# Patient Record
Sex: Male | Born: 1945
Health system: Southern US, Community
[De-identification: ages and names within clinical notes are randomized; demographics above are authoritative.]

## PROBLEM LIST (undated history)

## (undated) DIAGNOSIS — Z87442 Personal history of urinary calculi: Secondary | ICD-10-CM

## (undated) DIAGNOSIS — I509 Heart failure, unspecified: Secondary | ICD-10-CM

## (undated) DIAGNOSIS — N401 Enlarged prostate with lower urinary tract symptoms: Secondary | ICD-10-CM

## (undated) DIAGNOSIS — I129 Hypertensive chronic kidney disease with stage 1 through stage 4 chronic kidney disease, or unspecified chronic kidney disease: Secondary | ICD-10-CM

## (undated) DIAGNOSIS — I499 Cardiac arrhythmia, unspecified: Secondary | ICD-10-CM

## (undated) DIAGNOSIS — E1122 Type 2 diabetes mellitus with diabetic chronic kidney disease: Secondary | ICD-10-CM

## (undated) DIAGNOSIS — N529 Male erectile dysfunction, unspecified: Secondary | ICD-10-CM

## (undated) DIAGNOSIS — E119 Type 2 diabetes mellitus without complications: Secondary | ICD-10-CM

## (undated) DIAGNOSIS — I209 Angina pectoris, unspecified: Secondary | ICD-10-CM

## (undated) DIAGNOSIS — T4145XA Adverse effect of unspecified anesthetic, initial encounter: Secondary | ICD-10-CM

## (undated) DIAGNOSIS — Z9889 Other specified postprocedural states: Secondary | ICD-10-CM

## (undated) DIAGNOSIS — N400 Enlarged prostate without lower urinary tract symptoms: Secondary | ICD-10-CM

## (undated) DIAGNOSIS — G971 Other reaction to spinal and lumbar puncture: Secondary | ICD-10-CM

## (undated) DIAGNOSIS — E785 Hyperlipidemia, unspecified: Secondary | ICD-10-CM

## (undated) DIAGNOSIS — N183 Chronic kidney disease, stage 3 unspecified: Secondary | ICD-10-CM

## (undated) DIAGNOSIS — I429 Cardiomyopathy, unspecified: Secondary | ICD-10-CM

## (undated) DIAGNOSIS — R112 Nausea with vomiting, unspecified: Secondary | ICD-10-CM

## (undated) DIAGNOSIS — N210 Calculus in bladder: Secondary | ICD-10-CM

## (undated) DIAGNOSIS — T8859XA Other complications of anesthesia, initial encounter: Secondary | ICD-10-CM

## (undated) DIAGNOSIS — G4730 Sleep apnea, unspecified: Secondary | ICD-10-CM

## (undated) DIAGNOSIS — N138 Other obstructive and reflux uropathy: Secondary | ICD-10-CM

## (undated) DIAGNOSIS — M1990 Unspecified osteoarthritis, unspecified site: Secondary | ICD-10-CM

## (undated) DIAGNOSIS — F419 Anxiety disorder, unspecified: Secondary | ICD-10-CM

## (undated) DIAGNOSIS — F329 Major depressive disorder, single episode, unspecified: Secondary | ICD-10-CM

## (undated) DIAGNOSIS — I10 Essential (primary) hypertension: Secondary | ICD-10-CM

## (undated) DIAGNOSIS — F32A Depression, unspecified: Secondary | ICD-10-CM

## (undated) HISTORY — DX: Male erectile dysfunction, unspecified: N52.9

## (undated) HISTORY — DX: Unspecified osteoarthritis, unspecified site: M19.90

## (undated) HISTORY — PX: COLONOSCOPY: SHX174

## (undated) HISTORY — DX: Essential (primary) hypertension: I10

## (undated) HISTORY — DX: Anxiety disorder, unspecified: F41.9

## (undated) HISTORY — DX: Benign prostatic hyperplasia without lower urinary tract symptoms: N40.0

## (undated) HISTORY — DX: Sleep apnea, unspecified: G47.30

## (undated) HISTORY — PX: TONSILLECTOMY: SUR1361

---

## 1980-09-12 HISTORY — PX: VASECTOMY: SHX75

## 2000-05-27 HISTORY — PX: KNEE ARTHROSCOPY: SHX127

## 2002-02-24 ENCOUNTER — Ambulatory Visit (HOSPITAL_BASED_OUTPATIENT_CLINIC_OR_DEPARTMENT_OTHER): Admission: RE | Admit: 2002-02-24 | Discharge: 2002-02-24 | Payer: Self-pay | Admitting: *Deleted

## 2002-10-14 HISTORY — PX: RETINAL DETACHMENT REPAIR W/ SCLERAL BUCKLE LE: SHX2338

## 2002-12-02 ENCOUNTER — Encounter: Admission: RE | Admit: 2002-12-02 | Discharge: 2002-12-02 | Payer: Self-pay | Admitting: Family Medicine

## 2002-12-02 ENCOUNTER — Encounter: Payer: Self-pay | Admitting: Family Medicine

## 2003-03-13 HISTORY — PX: CATARACT EXTRACTION W/ INTRAOCULAR LENS  IMPLANT, BILATERAL: SHX1307

## 2004-05-24 ENCOUNTER — Encounter: Admission: RE | Admit: 2004-05-24 | Discharge: 2004-05-24 | Payer: Self-pay | Admitting: Family Medicine

## 2004-06-30 ENCOUNTER — Ambulatory Visit (HOSPITAL_COMMUNITY): Admission: AD | Admit: 2004-06-30 | Discharge: 2004-07-01 | Payer: Self-pay | Admitting: Ophthalmology

## 2004-09-10 ENCOUNTER — Ambulatory Visit: Payer: Self-pay | Admitting: Family Medicine

## 2004-09-29 ENCOUNTER — Ambulatory Visit: Payer: Self-pay | Admitting: Gastroenterology

## 2004-10-12 ENCOUNTER — Ambulatory Visit: Payer: Self-pay | Admitting: Gastroenterology

## 2006-06-29 ENCOUNTER — Ambulatory Visit: Payer: Self-pay | Admitting: Family Medicine

## 2006-06-29 LAB — CONVERTED CEMR LAB
ALT: 23 units/L (ref 0–40)
AST: 18 units/L (ref 0–37)
Albumin: 3.9 g/dL (ref 3.5–5.2)
Alkaline Phosphatase: 60 units/L (ref 39–117)
BUN: 14 mg/dL (ref 6–23)
Basophils Absolute: 0 10*3/uL (ref 0.0–0.1)
Basophils Relative: 0.6 % (ref 0.0–1.0)
CO2: 26 meq/L (ref 19–32)
Calcium: 9 mg/dL (ref 8.4–10.5)
Chloride: 107 meq/L (ref 96–112)
Chol/HDL Ratio, serum: 5.3
Cholesterol: 218 mg/dL (ref 0–200)
Creatinine, Ser: 1 mg/dL (ref 0.4–1.5)
Eosinophil percent: 4 % (ref 0.0–5.0)
GFR calc non Af Amer: 81 mL/min
Glomerular Filtration Rate, Af Am: 98 mL/min/{1.73_m2}
Glucose, Bld: 139 mg/dL — ABNORMAL HIGH (ref 70–99)
HCT: 45.2 % (ref 39.0–52.0)
HDL: 41.5 mg/dL (ref >39.0)
Hemoglobin: 15.1 g/dL (ref 13.0–17.0)
Hgb A1c MFr Bld: 7.1 % — ABNORMAL HIGH (ref 4.6–6.0)
LDL DIRECT: 147.1 mg/dL
Lymphocytes Relative: 22.3 % (ref 12.0–46.0)
MCHC: 33.3 g/dL (ref 30.0–36.0)
MCV: 91.4 fL (ref 78.0–100.0)
Monocytes Absolute: 0.6 10*3/uL (ref 0.2–0.7)
Monocytes Relative: 8.3 % (ref 3.0–11.0)
Neutro Abs: 4.3 10*3/uL (ref 1.4–7.7)
Neutrophils Relative %: 64.8 % (ref 43.0–77.0)
PSA: 1.71 ng/mL (ref 0.10–4.00)
Platelets: 239 10*3/uL (ref 150–400)
Potassium: 4 meq/L (ref 3.5–5.1)
RBC: 4.95 M/uL (ref 4.22–5.81)
RDW: 12.9 % (ref 11.5–14.6)
Sodium: 141 meq/L (ref 135–145)
TSH: 1.56 microintl units/mL (ref 0.35–5.50)
Total Bilirubin: 0.7 mg/dL (ref 0.3–1.2)
Total Protein: 6.8 g/dL (ref 6.0–8.3)
Triglyceride fasting, serum: 118 mg/dL (ref 0–149)
VLDL: 24 mg/dL (ref 0–40)
WBC: 6.7 10*3/uL (ref 4.5–10.5)

## 2006-07-06 ENCOUNTER — Ambulatory Visit: Payer: Self-pay | Admitting: Family Medicine

## 2006-08-02 ENCOUNTER — Ambulatory Visit: Payer: Self-pay | Admitting: Family Medicine

## 2006-08-07 ENCOUNTER — Ambulatory Visit: Payer: Self-pay | Admitting: Internal Medicine

## 2006-08-28 ENCOUNTER — Ambulatory Visit: Payer: Self-pay | Admitting: Family Medicine

## 2007-01-30 ENCOUNTER — Ambulatory Visit: Payer: Self-pay | Admitting: Family Medicine

## 2007-01-30 LAB — CONVERTED CEMR LAB
Cholesterol: 226 mg/dL (ref 0–200)
Creatinine,U: 97.6 mg/dL
Direct LDL: 141 mg/dL
Glucose, Bld: 95 mg/dL (ref 70–99)
HDL: 43.7 mg/dL (ref >39.0)
Hgb A1c MFr Bld: 6.8 % — ABNORMAL HIGH (ref 4.6–6.0)
Microalb Creat Ratio: 2 mg/g (ref 0.0–30.0)
Microalb, Ur: 0.2 mg/dL (ref 0.0–1.9)
Total CHOL/HDL Ratio: 5.2
Triglycerides: 216 mg/dL (ref 0–149)
VLDL: 43 mg/dL — ABNORMAL HIGH (ref 0–40)

## 2007-07-09 DIAGNOSIS — I152 Hypertension secondary to endocrine disorders: Secondary | ICD-10-CM | POA: Insufficient documentation

## 2007-07-09 DIAGNOSIS — E1159 Type 2 diabetes mellitus with other circulatory complications: Secondary | ICD-10-CM | POA: Insufficient documentation

## 2007-07-09 DIAGNOSIS — I10 Essential (primary) hypertension: Secondary | ICD-10-CM

## 2007-07-09 DIAGNOSIS — F411 Generalized anxiety disorder: Secondary | ICD-10-CM | POA: Insufficient documentation

## 2008-03-18 ENCOUNTER — Telehealth: Payer: Self-pay | Admitting: *Deleted

## 2008-03-28 ENCOUNTER — Ambulatory Visit: Payer: Self-pay | Admitting: Family Medicine

## 2008-03-28 DIAGNOSIS — N400 Enlarged prostate without lower urinary tract symptoms: Secondary | ICD-10-CM | POA: Insufficient documentation

## 2008-03-31 LAB — CONVERTED CEMR LAB
BUN: 16 mg/dL (ref 6–23)
CO2: 28 meq/L (ref 19–32)
Calcium: 9.2 mg/dL (ref 8.4–10.5)
Chloride: 102 meq/L (ref 96–112)
Creatinine, Ser: 0.8 mg/dL (ref 0.4–1.5)
GFR calc Af Amer: 126 mL/min
GFR calc non Af Amer: 104 mL/min
Glucose, Bld: 101 mg/dL — ABNORMAL HIGH (ref 70–99)
Hgb A1c MFr Bld: 7.1 % — ABNORMAL HIGH (ref 4.6–6.0)
Potassium: 3.7 meq/L (ref 3.5–5.1)
Sodium: 137 meq/L (ref 135–145)

## 2008-05-21 ENCOUNTER — Ambulatory Visit: Payer: Self-pay | Admitting: Family Medicine

## 2008-05-21 LAB — CONVERTED CEMR LAB
ALT: 24 units/L (ref 0–53)
AST: 17 units/L (ref 0–37)
Albumin: 3.8 g/dL (ref 3.5–5.2)
Alkaline Phosphatase: 51 units/L (ref 39–117)
BUN: 12 mg/dL (ref 6–23)
Basophils Absolute: 0 10*3/uL (ref 0.0–0.1)
Basophils Relative: 0.4 % (ref 0.0–3.0)
Bilirubin Urine: NEGATIVE
Bilirubin, Direct: 0.1 mg/dL (ref 0.0–0.3)
Blood in Urine, dipstick: NEGATIVE
CO2: 30 meq/L (ref 19–32)
Calcium: 8.9 mg/dL (ref 8.4–10.5)
Chloride: 112 meq/L (ref 96–112)
Cholesterol: 110 mg/dL (ref 0–200)
Creatinine, Ser: 0.9 mg/dL (ref 0.4–1.5)
Eosinophils Absolute: 0.3 10*3/uL (ref 0.0–0.7)
Eosinophils Relative: 4.1 % (ref 0.0–5.0)
GFR calc Af Amer: 110 mL/min
GFR calc non Af Amer: 91 mL/min
Glucose, Bld: 127 mg/dL — ABNORMAL HIGH (ref 70–99)
Glucose, Urine, Semiquant: NEGATIVE
HCT: 41.6 % (ref 39.0–52.0)
HDL: 38.3 mg/dL — ABNORMAL LOW (ref >39.0)
Hemoglobin: 14.5 g/dL (ref 13.0–17.0)
Ketones, urine, test strip: NEGATIVE
LDL Cholesterol: 60 mg/dL (ref 0–99)
Lymphocytes Relative: 23.2 % (ref 12.0–46.0)
MCHC: 34.9 g/dL (ref 30.0–36.0)
MCV: 92.4 fL (ref 78.0–100.0)
Monocytes Absolute: 0.7 10*3/uL (ref 0.1–1.0)
Monocytes Relative: 9.2 % (ref 3.0–12.0)
Neutro Abs: 4.5 10*3/uL (ref 1.4–7.7)
Neutrophils Relative %: 63.1 % (ref 43.0–77.0)
Nitrite: NEGATIVE
PSA: 1.48 ng/mL (ref 0.10–4.00)
Platelets: 248 10*3/uL (ref 150–400)
Potassium: 4.3 meq/L (ref 3.5–5.1)
Protein, U semiquant: NEGATIVE
RBC: 4.5 M/uL (ref 4.22–5.81)
RDW: 12.8 % (ref 11.5–14.6)
Sodium: 142 meq/L (ref 135–145)
Specific Gravity, Urine: 1.025
TSH: 1.73 microintl units/mL (ref 0.35–5.50)
Total Bilirubin: 0.6 mg/dL (ref 0.3–1.2)
Total CHOL/HDL Ratio: 2.9
Total Protein: 6.6 g/dL (ref 6.0–8.3)
Triglycerides: 60 mg/dL (ref 0–149)
Urobilinogen, UA: 0.2
VLDL: 12 mg/dL (ref 0–40)
WBC Urine, dipstick: NEGATIVE
WBC: 7.1 10*3/uL (ref 4.5–10.5)
pH: 5.5

## 2008-05-27 ENCOUNTER — Encounter: Payer: Self-pay | Admitting: Family Medicine

## 2008-05-28 ENCOUNTER — Ambulatory Visit: Payer: Self-pay | Admitting: Family Medicine

## 2008-05-28 DIAGNOSIS — F528 Other sexual dysfunction not due to a substance or known physiological condition: Secondary | ICD-10-CM | POA: Insufficient documentation

## 2008-11-28 ENCOUNTER — Ambulatory Visit: Payer: Self-pay | Admitting: Family Medicine

## 2008-11-28 DIAGNOSIS — G43909 Migraine, unspecified, not intractable, without status migrainosus: Secondary | ICD-10-CM | POA: Insufficient documentation

## 2009-06-17 ENCOUNTER — Telehealth: Payer: Self-pay | Admitting: Family Medicine

## 2009-08-05 ENCOUNTER — Telehealth: Payer: Self-pay | Admitting: Family Medicine

## 2009-08-26 ENCOUNTER — Telehealth: Payer: Self-pay | Admitting: Family Medicine

## 2009-10-26 LAB — HM DIABETES EYE EXAM: HM Diabetic Eye Exam: NORMAL

## 2009-11-12 ENCOUNTER — Ambulatory Visit: Payer: Self-pay | Admitting: Family Medicine

## 2009-11-12 LAB — CONVERTED CEMR LAB
ALT: 26 units/L (ref 0–53)
AST: 20 units/L (ref 0–37)
Albumin: 3.8 g/dL (ref 3.5–5.2)
Alkaline Phosphatase: 53 units/L (ref 39–117)
BUN: 17 mg/dL (ref 6–23)
Basophils Absolute: 0 10*3/uL (ref 0.0–0.1)
Basophils Relative: 0.1 % (ref 0.0–3.0)
Bilirubin Urine: NEGATIVE
Bilirubin, Direct: 0.2 mg/dL (ref 0.0–0.3)
Blood in Urine, dipstick: NEGATIVE
CO2: 28 meq/L (ref 19–32)
Calcium: 8.9 mg/dL (ref 8.4–10.5)
Chloride: 105 meq/L (ref 96–112)
Cholesterol: 119 mg/dL (ref 0–200)
Creatinine, Ser: 0.9 mg/dL (ref 0.4–1.5)
Eosinophils Absolute: 0.3 10*3/uL (ref 0.0–0.7)
Eosinophils Relative: 4.1 % (ref 0.0–5.0)
GFR calc non Af Amer: 90.57 mL/min (ref >60)
Glucose, Bld: 155 mg/dL — ABNORMAL HIGH (ref 70–99)
Glucose, Urine, Semiquant: 100
HCT: 43.8 % (ref 39.0–52.0)
HDL: 47 mg/dL (ref >39.00)
Hemoglobin: 14.4 g/dL (ref 13.0–17.0)
Ketones, urine, test strip: NEGATIVE
LDL Cholesterol: 57 mg/dL (ref 0–99)
Lymphocytes Relative: 20 % (ref 12.0–46.0)
Lymphs Abs: 1.4 10*3/uL (ref 0.7–4.0)
MCHC: 32.9 g/dL (ref 30.0–36.0)
MCV: 94.6 fL (ref 78.0–100.0)
Monocytes Absolute: 0.6 10*3/uL (ref 0.1–1.0)
Monocytes Relative: 8.7 % (ref 3.0–12.0)
Neutro Abs: 4.6 10*3/uL (ref 1.4–7.7)
Neutrophils Relative %: 67.1 % (ref 43.0–77.0)
Nitrite: NEGATIVE
PSA: 2.12 ng/mL (ref 0.10–4.00)
Platelets: 216 10*3/uL (ref 150.0–400.0)
Potassium: 4.3 meq/L (ref 3.5–5.1)
RBC: 4.62 M/uL (ref 4.22–5.81)
RDW: 12.4 % (ref 11.5–14.6)
Sodium: 140 meq/L (ref 135–145)
Specific Gravity, Urine: >=1.03
TSH: 1.58 microintl units/mL (ref 0.35–5.50)
Total Bilirubin: 0.6 mg/dL (ref 0.3–1.2)
Total CHOL/HDL Ratio: 3
Total Protein: 7 g/dL (ref 6.0–8.3)
Triglycerides: 75 mg/dL (ref 0.0–149.0)
Urobilinogen, UA: 0.2
VLDL: 15 mg/dL (ref 0.0–40.0)
WBC Urine, dipstick: NEGATIVE
WBC: 6.9 10*3/uL (ref 4.5–10.5)
pH: 5

## 2009-12-08 ENCOUNTER — Telehealth: Payer: Self-pay | Admitting: Family Medicine

## 2009-12-11 ENCOUNTER — Ambulatory Visit: Payer: Self-pay | Admitting: Family Medicine

## 2009-12-11 ENCOUNTER — Telehealth: Payer: Self-pay | Admitting: Family Medicine

## 2009-12-11 LAB — HM DIABETES FOOT EXAM

## 2010-01-28 ENCOUNTER — Ambulatory Visit: Payer: Self-pay | Admitting: Family Medicine

## 2010-01-28 DIAGNOSIS — J309 Allergic rhinitis, unspecified: Secondary | ICD-10-CM | POA: Insufficient documentation

## 2010-02-11 ENCOUNTER — Ambulatory Visit: Payer: Self-pay | Admitting: Family Medicine

## 2010-02-12 LAB — CONVERTED CEMR LAB
BUN: 15 mg/dL (ref 6–23)
CO2: 30 meq/L (ref 19–32)
Calcium: 9.4 mg/dL (ref 8.4–10.5)
Chloride: 106 meq/L (ref 96–112)
Creatinine, Ser: 0.9 mg/dL (ref 0.4–1.5)
GFR calc non Af Amer: 91.67 mL/min (ref >60)
Glucose, Bld: 138 mg/dL — ABNORMAL HIGH (ref 70–99)
Hgb A1c MFr Bld: 6.5 % (ref 4.6–6.5)
Potassium: 4.7 meq/L (ref 3.5–5.1)
Sodium: 143 meq/L (ref 135–145)

## 2010-08-19 ENCOUNTER — Ambulatory Visit: Payer: Self-pay | Admitting: Family Medicine

## 2010-08-19 ENCOUNTER — Encounter: Payer: Self-pay | Admitting: Family Medicine

## 2010-08-19 LAB — CONVERTED CEMR LAB
Bilirubin Urine: NEGATIVE
Blood in Urine, dipstick: NEGATIVE
Glucose, Urine, Semiquant: NEGATIVE
Ketones, urine, test strip: NEGATIVE
Nitrite: NEGATIVE
Protein, U semiquant: NEGATIVE
Specific Gravity, Urine: 1.01
Urobilinogen, UA: 0.2
WBC Urine, dipstick: NEGATIVE
pH: 6

## 2010-08-20 LAB — CONVERTED CEMR LAB
ALT: 17 units/L (ref 0–53)
AST: 14 units/L (ref 0–37)
Albumin: 3.9 g/dL (ref 3.5–5.2)
Alkaline Phosphatase: 50 units/L (ref 39–117)
BUN: 13 mg/dL (ref 6–23)
Basophils Absolute: 0 10*3/uL (ref 0.0–0.1)
Basophils Relative: 0.6 % (ref 0.0–3.0)
Bilirubin, Direct: 0.2 mg/dL (ref 0.0–0.3)
CO2: 29 meq/L (ref 19–32)
Calcium: 9.3 mg/dL (ref 8.4–10.5)
Chloride: 102 meq/L (ref 96–112)
Creatinine, Ser: 0.8 mg/dL (ref 0.4–1.5)
Eosinophils Absolute: 0.3 10*3/uL (ref 0.0–0.7)
Eosinophils Relative: 4.1 % (ref 0.0–5.0)
GFR calc non Af Amer: 102.03 mL/min (ref >60.00)
Glucose, Bld: 88 mg/dL (ref 70–99)
HCT: 44.1 % (ref 39.0–52.0)
Hemoglobin: 15.1 g/dL (ref 13.0–17.0)
Hgb A1c MFr Bld: 6.5 % (ref 4.6–6.5)
Lymphocytes Relative: 27.5 % (ref 12.0–46.0)
Lymphs Abs: 2.1 10*3/uL (ref 0.7–4.0)
MCHC: 34.2 g/dL (ref 30.0–36.0)
MCV: 93.1 fL (ref 78.0–100.0)
Monocytes Absolute: 0.7 10*3/uL (ref 0.1–1.0)
Monocytes Relative: 9.2 % (ref 3.0–12.0)
Neutro Abs: 4.5 10*3/uL (ref 1.4–7.7)
Neutrophils Relative %: 58.6 % (ref 43.0–77.0)
Platelets: 238 10*3/uL (ref 150.0–400.0)
Potassium: 4.4 meq/L (ref 3.5–5.1)
RBC: 4.74 M/uL (ref 4.22–5.81)
RDW: 13.4 % (ref 11.5–14.6)
Sodium: 138 meq/L (ref 135–145)
TSH: 1.9 microintl units/mL (ref 0.35–5.50)
Total Bilirubin: 0.7 mg/dL (ref 0.3–1.2)
Total Protein: 6.7 g/dL (ref 6.0–8.3)
WBC: 7.7 10*3/uL (ref 4.5–10.5)

## 2010-09-07 ENCOUNTER — Ambulatory Visit
Admission: RE | Admit: 2010-09-07 | Discharge: 2010-09-07 | Payer: Self-pay | Source: Home / Self Care | Attending: Specialist | Admitting: Specialist

## 2010-09-07 HISTORY — PX: KNEE ARTHROSCOPY: SUR90

## 2010-10-10 LAB — CONVERTED CEMR LAB
Creatinine,U: 168.9 mg/dL
Hgb A1c MFr Bld: 7.4 % — ABNORMAL HIGH (ref 4.6–6.5)
Microalb Creat Ratio: 8.9 mg/g (ref 0.0–30.0)
Microalb, Ur: 1.5 mg/dL (ref 0.0–1.9)

## 2010-10-14 NOTE — Assessment & Plan Note (Signed)
Summary: follow up appt/pt coming in fasting/cjr   Vital Signs:  Patient profile:   65 year old male Weight:      260 pounds Temp:     98.0 degrees F oral BP sitting:   110 / 68  (left arm) Cuff size:   regular  Vitals Entered By: Westley Hummer CMA Deborra Medina) (February 11, 2010 9:10 AM) CC: follow-up visit   CC:  follow-up visit.  History of Present Illness: Keith [name] is a 65 year old male, who comes in today for follow-up of diabetes.  He was seen last two months ago.  His fasting blood sugars 155 with an elevated hemoglobin A1C  Since that time he started a diet and exercise program.  He's lost 17 pounds.  We also stopped his Lasix.  Blood pressure remains normal.  110/68  Allergies: 1)  ! Pcn  Past History:  Past medical, surgical, family and social histories (including risk factors) reviewed for relevance to current acute and chronic problems.  Past Medical History: Reviewed history from 05/28/2008 and no changes required. Anxiety Diabetes mellitus, type II Hypertension Benign prostatic hypertrophy erectile dysfunction  Past Surgical History: Reviewed history from 07/09/2007 and no changes required. Colonoscopy Tonsillectomy  Family History: Reviewed history from 07/09/2007 and no changes required. Family History of Arthritis Family History Breast cancer 1st degree relative <50 Family History of CAD Male 1st degree relative <50 Family History Hypertension Family History of Skin cancer Family History of Cardiovascular disorder  Social History: Reviewed history from 05/28/2008 and no changes required. Occupation: Married Never Smoked Alcohol use-yes Regular exercise-yes two grandchildren, ages 70 and 36 live in [place]  Review of Systems      See HPI  Physical Exam  General:  Well-developed,well-nourished,in no acute distress; alert,appropriate and cooperative throughout examination   Impression & Recommendations:  Problem # 1:  DIABETES MELLITUS,  TYPE II (ICD-250.00) Assessment Improved  His updated medication list for this problem includes:    Adult Aspirin Low Strength 81 Mg Tbdp (Aspirin)    Zestril 10 Mg Tabs (Lisinopril) .Marland Kitchen... Take 1 tablet by mouth every morning  Orders: Venipuncture [id number]) TLB-BMP (Basic Metabolic Panel-BMET) (99991111) TLB-A1C / Hgb A1C (Glycohemoglobin) (83036-A1C)  Complete Medication List: 1)  Flomax 0.4 Mg Cp24 (Tamsulosin hcl) .Marland Kitchen.. 1 tab @ bedtime 2)  Adult Aspirin Low Strength 81 Mg Tbdp (Aspirin) 3)  Zestril 10 Mg Tabs (Lisinopril) .... Take 1 tablet by mouth every morning 4)  Zocor 20 Mg Tabs (Simvastatin) .Marland Kitchen.. 1 tab @ bedtime 5)  Vitamin D 50000 Unit Caps (Ergocalciferol) .... Once daily 6)  Potassium Gluconate 550 Mg Tabs (Potassium gluconate) .... Once daily 7)  Glucosamine-chondroitin 1500-1200 Mg/64ml Liqd (Glucosamine-chondroitin) .... Once daily 8)  Celexa 40 Mg Tabs (Citalopram hydrobromide) .Marland Kitchen.. 1 tab @ bedtime 9)  Cialis 20 Mg Tabs (Tadalafil) .... Uad 10)  Allegra 180 Mg Tabs (Fexofenadine hcl) .... Take 1 tablet by mouth every morning 11)  Flonase 50 Mcg/act Susp (Fluticasone propionate) .... Uad  Patient Instructions: 1)  continue your current exercise program.  I will call you when I get your lab work back

## 2010-10-14 NOTE — Assessment & Plan Note (Signed)
Summary: CPX/NJR   Vital Signs:  Patient Profile:   65 Years Old Male Height:     72 inches Weight:      274 pounds Temp:     98.9 degrees F oral Pulse rate:   64 / minute Pulse rhythm:   regular BP sitting:   112 / 72  (left arm) Cuff size:   large  Vitals Entered By: Westley Hummer CMA (May 28, 2008 8:20 AM)                 Chief Complaint:  cpx.  History of Present Illness: Keith [name] is a 65 year old male comes in today for evaluation of BPH, mild depression with hypertension, and hyperlipidemia, and erectile dysfunction.  His BPH, history of Flomax .4.  He takes two tablets daily with good results.  He's had no side effects from his medication.  The mild depression.  History with Celexa 40 mg at bedtime is doing well.  His hypertension, history of Zestril, 10 mg daily, along with Lasix 20 mg daily.  He also takes an over-the-counter potassium supplement, blood pressure is 112/72.  No side effects from medication.  For hyperlipidemia.  He takes Zocor 20 mg at bedtime, and an 81 mg, baby aspirin.  Lipids are at goal.  For rectal dysfunction he for Cialis 20 mg.  His last admission.  It was 2000.  We will give her Pneumovax today.  He gets a flu shot at work and a colonoscopy and GI.  He gets regular eye and dental care.    Prior Medication List:  FLOMAX 0.4 MG  CP24 (TAMSULOSIN HCL)  ADULT ASPIRIN LOW STRENGTH 81 MG  TBDP (ASPIRIN)  LASIX 20 MG  TABS (FUROSEMIDE) Take 1 tablet by mouth every morning ZESTRIL 10 MG  TABS (LISINOPRIL) Take 1 tablet by mouth every morning ZOCOR 20 MG  TABS (SIMVASTATIN) 1 tab @ bedtime VITAMIN D 29562 UNIT  CAPS (ERGOCALCIFEROL) once daily POTASSIUM GLUCONATE 550 MG  TABS (POTASSIUM GLUCONATE) once daily SM GLUCOSAMINE HCL 1500 MG  TABS (GLUCOSAMINE HCL) once daily GLUCOSAMINE-CHONDROITIN 1500-1200 MG/30ML  LIQD (GLUCOSAMINE-CHONDROITIN) once daily CELEXA 40 MG  TABS (CITALOPRAM HYDROBROMIDE) 1 tab @ bedtime   Current Allergies  (reviewed today): ! PCN  Past Medical History:    Reviewed history from 03/28/2008 and no changes required:       Anxiety       Diabetes mellitus, type II       Hypertension       Benign prostatic hypertrophy       erectile dysfunction   Family History:    Reviewed history from 07/09/2007 and no changes required:       Family History of Arthritis       Family History Breast cancer 1st degree relative <50       Family History of CAD Male 1st degree relative <50       Family History Hypertension       Family History of Skin cancer       Family History of Cardiovascular disorder  Social History:    Reviewed history from 07/09/2007 and no changes required:       Occupation:       Married       Never Smoked       Alcohol use-yes       Regular exercise-yes       two grandchildren, ages 36 and 53 live in [place]   Risk Factors:  Exercise:  yes   Review of Systems      See HPI   Physical Exam  General:     Well-developed,well-nourished,in no acute distress; alert,appropriate and cooperative throughout examination Head:     Normocephalic and atraumatic without obvious abnormalities. No apparent alopecia or balding. Eyes:     No corneal or conjunctival inflammation noted. EOMI. Perrla. Funduscopic exam benign, without hemorrhages, exudates or papilledema. Vision grossly normal. Ears:     External ear exam shows no significant lesions or deformities.  Otoscopic examination reveals clear canals, tympanic membranes are intact bilaterally without bulging, retraction, inflammation or discharge. Hearing is grossly normal bilaterally. Nose:     External nasal examination shows no deformity or inflammation. Nasal mucosa are pink and moist without lesions or exudates. Mouth:     Oral mucosa and oropharynx without lesions or exudates.  Teeth in good repair. Neck:     No deformities, masses, or tenderness noted. Chest Wall:     No deformities, masses, tenderness or  gynecomastia noted. Breasts:     No masses or gynecomastia noted Lungs:     Normal respiratory effort, chest expands symmetrically. Lungs are clear to auscultation, no crackles or wheezes. Heart:     Normal rate and regular rhythm. S1 and S2 normal without gallop, murmur, click, rub or other extra sounds. Abdomen:     Bowel sounds positive,abdomen soft and non-tender without masses, organomegaly or hernias noted. Rectal:     No external abnormalities noted. Normal sphincter tone. No rectal masses or tenderness. Genitalia:     Testes bilaterally descended without nodularity, tenderness or masses. No scrotal masses or lesions. No penis lesions or urethral discharge. Prostate:     no nodules, no asymmetry, and 1+ enlarged.   Msk:     No deformity or scoliosis noted of thoracic or lumbar spine.   Pulses:     R and L carotid,radial,femoral,dorsalis pedis and posterior tibial pulses are full and equal bilaterally Extremities:     No clubbing, cyanosis, edema, or deformity noted with normal full range of motion of all joints.   Neurologic:     No cranial nerve deficits noted. Station and gait are normal. Plantar reflexes are down-going bilaterally. DTRs are symmetrical throughout. Sensory, motor and coordinative functions appear intact. Skin:     Intact without suspicious lesions or rashes Cervical Nodes:     No lymphadenopathy noted Axillary Nodes:     No palpable lymphadenopathy Inguinal Nodes:     No significant adenopathy Psych:     Cognition and judgment appear intact. Alert and cooperative with normal attention span and concentration. No apparent delusions, illusions, hallucinations    Impression & Recommendations:  Problem # 1:  BENIGN PROSTATIC HYPERTROPHY (ICD-600.00) Assessment: Improved  Problem # 2:  HYPERTENSION (ICD-401.9) Assessment: Improved  His updated medication list for this problem includes:    Lasix 20 Mg Tabs (Furosemide) .Marland Kitchen... Take 1 tablet by mouth every  morning    Zestril 10 Mg Tabs (Lisinopril) .Marland Kitchen... Take 1 tablet by mouth every morning  Orders: EKG w/ Interpretation (93000)   Problem # 3:  DIABETES MELLITUS, TYPE II (ICD-250.00) Assessment: Unchanged  His updated medication list for this problem includes:    Adult Aspirin Low Strength 81 Mg Tbdp (Aspirin)    Zestril 10 Mg Tabs (Lisinopril) .Marland Kitchen... Take 1 tablet by mouth every morning   Problem # 4:  ANXIETY (ICD-300.00) Assessment: Improved  His updated medication list for this problem includes:    Celexa 40 Mg Tabs (  Citalopram hydrobromide) .Marland Kitchen... 1 tab @ bedtime   Problem # 5:  ERECTILE DYSFUNCTION, MILD (ICD-302.72) Assessment: Improved  His updated medication list for this problem includes:    Cialis 20 Mg Tabs (Tadalafil) ..... Uad   Complete Medication List: 1)  Flomax 0.4 Mg Cp24 (Tamsulosin hcl) .... 2 by mouth qam 2)  Adult Aspirin Low Strength 81 Mg Tbdp (Aspirin) 3)  Lasix 20 Mg Tabs (Furosemide) .... Take 1 tablet by mouth every morning 4)  Zestril 10 Mg Tabs (Lisinopril) .... Take 1 tablet by mouth every morning 5)  Zocor 20 Mg Tabs (Simvastatin) .Marland Kitchen.. 1 tab @ bedtime 6)  Vitamin D 50000 Unit Caps (Ergocalciferol) .... Once daily 7)  Potassium Gluconate 550 Mg Tabs (Potassium gluconate) .... Once daily 8)  Glucosamine-chondroitin 1500-1200 Mg/28ml Liqd (Glucosamine-chondroitin) .... Once daily 9)  Celexa 40 Mg Tabs (Citalopram hydrobromide) .Marland Kitchen.. 1 tab @ bedtime 10)  Cialis 20 Mg Tabs (Tadalafil) .... Uad  Other Orders: Pneumococcal Vaccine [id number]) Admin 1st Vaccine [phone])   Patient Instructions: 1)  Please schedule a follow-up appointment in 1 year. 2)  It is important that you exercise regularly at least 20 minutes 5 times a week. If you develop chest pain, have severe difficulty breathing, or feel very tired , stop exercising immediately and seek medical attention. 3)  You need to lose weight. Consider a lower calorie diet and regular exercise.  4)   Take an Aspirin every day. 5)  See your eye doctor yearly to check for diabetic eye damage. 6)  Check your Blood Pressure regularly. If it is above: you should make an appointment.   Prescriptions: CELEXA 40 MG  TABS (CITALOPRAM HYDROBROMIDE) 1 tab @ bedtime  #100 x 3   Entered and Authorized by:   Dorena Cookey MD   Signed by:   Dorena Cookey MD on 05/28/2008   Method used:   Electronically to        [facility]  [phone]* (retail)       [address]       [place], [place]  [address]       Ph: [phone] or [phone]       Fax: [phone]   RxID:   [id number] MG  TABS (SIMVASTATIN) 1 tab @ bedtime  #100 x 3   Entered and Authorized by:   Dorena Cookey MD   Signed by:   Dorena Cookey MD on 05/28/2008   Method used:   Electronically to        [facility]  [phone]* (retail)       [address]       [place], [place]  [address]       Ph: [phone] or [phone]       Fax: [phone]   [id number] ZESTRIL 10 MG  TABS (LISINOPRIL) Take 1 tablet by mouth every morning  #100 x 3   Entered and Authorized by:   Dorena Cookey MD   Signed by:   Dorena Cookey MD on 05/28/2008   Method used:   Electronically to        [facility]  [phone]* (retail)       [address]       [place], [place]  [address]  Ph: [phone] or [phone]       Fax: [phone]   [id number] LASIX 20 MG  TABS (FUROSEMIDE) Take 1 tablet by mouth every morning  #100 x 3   Entered and Authorized by:   Dorena Cookey MD   Signed by:   Dorena Cookey MD on 05/28/2008   Method used:   Electronically to        [facility]  [phone]* (retail)       [address]       [place], [place]  [address]       Ph: [phone] or [phone]       Fax: [phone]   RxID:   [id number] FLOMAX 0.4 MG  CP24  (TAMSULOSIN HCL) 2 by mouth qam  #200 x 3   Entered and Authorized by:   Dorena Cookey MD   Signed by:   Dorena Cookey MD on 05/28/2008   Method used:   Electronically to        [facility]  [phone]* (retail)       [address]       [place], [place]  [address]       Ph: [phone] or [phone]       Fax: [phone]   [id number] CIALIS 20 MG TABS (TADALAFIL) UAD  #6 x 11   Entered and Authorized by:   Dorena Cookey MD   Signed by:   Dorena Cookey MD on 05/28/2008   Method used:   Electronically to        [facility]  [phone]* (retail)       [address]       [place], [place]  [address]       Ph: [phone] or [phone]       Fax: [phone]   RxID:   [id number]  ]  Pneumovax Vaccine    Vaccine Type: Pneumovax    Site: right deltoid    Mfr: Merck    Dose: 0.5 ml    Route: IM    Given by: Westley Hummer CMA    Exp. Date: 03/07/2009    Lot #: [id number]

## 2010-10-14 NOTE — Progress Notes (Signed)
Summary: celexa refill  Phone Note From Pharmacy   Caller: pharmacy Call For: Dr. Sherren Mocha  Reason for Call: Medication not on formulary Details for Reason: refill of celexa Summary of Call: patient would like a refill of his celexa is this okay?  the last time patient was seen was 01/30/07. Initial call taken by: Westley Hummer CMA,  March 18, 2008 9:10 AM  Follow-up for Phone Call        30 day supply no refills.  Patient needs to set up an office visit sometime in the next two weeks Follow-up by: Dorena Cookey MD,  March 20, 2008 7:40 AM    New/Updated Medications: CELEXA 20 MG  TABS (CITALOPRAM HYDROBROMIDE) take one tab at bedtime   Prescriptions: CELEXA 20 MG  TABS (CITALOPRAM HYDROBROMIDE) take one tab at bedtime  #30 x 0   Entered by:   Westley Hummer CMA   Authorized by:   Dorena Cookey MD   Signed by:   Westley Hummer CMA on 03/20/2008   Method used:   Electronically sent to ...       [facility]  [phone]*       [address]       [place], [place]  [address]       Ph: [phone] or [phone]       Fax: [phone]   RxID:   [id number]

## 2010-10-14 NOTE — Assessment & Plan Note (Signed)
Summary: renew meds/ccm   Vital Signs:  Patient Profile:   66 Years Old Male Weight:      276 pounds Temp:     97.8 degrees F oral BP sitting:   130 / 80  (left arm)  Vitals Entered By: Westley Hummer CMA (March 28, 2008 11:39 AM)                 Chief Complaint:  follow up meds.  History of Present Illness: Keith [name] is a 65 year old male comes in today for evaluation of multiple problems.  His blood pressures under good control 130/80 with Zestril 10 mg daily.  He takes Celexa 20 mg nightly to help with mood.  He ran out of his medicine 4 weeks and he says he got real grouchy.  His back on 20 mg a day.  They would like to increase the dose because it doesn't seem to help that much.  We gave him Zocor 20 mg nightly to take for hyperlipidemia.  His LDL was in the 150 range and he is borderline diabetic with a hemoglobin A1c of 6.8.  He's been walking, but he can't lose weight.  He did not get the Zocor field.  His having more trouble with BPH.  He saw his urologist this week.  They recommended a caffeine free diet, and possible urologic intervention.  He is on Flomax .4 daily.  He continues to struggle with his weight.  He is not walking with check a hemoglobin A1c today    Current Allergies (reviewed today): ! PCN  Past Medical History:    Reviewed history from 07/09/2007 and no changes required:       Anxiety       Diabetes mellitus, type II       Hypertension       Benign prostatic hypertrophy   Social History:    Reviewed history from 07/09/2007 and no changes required:       Occupation:       Married       Never Smoked       Alcohol use-yes    Review of Systems      See HPI   Physical Exam  General:     Well-developed,well-nourished,in no acute distress; alert,appropriate and cooperative throughout examination    Impression & Recommendations:  Problem # 1:  BENIGN PROSTATIC HYPERTROPHY (ICD-600.00) Assessment: Deteriorated  Problem # 2:   HYPERTENSION (ICD-401.9) Assessment: Improved  His updated medication list for this problem includes:    Lasix 20 Mg Tabs (Furosemide) .Marland Kitchen... Take 1 tablet by mouth every morning    Zestril 10 Mg Tabs (Lisinopril) .Marland Kitchen... Take 1 tablet by mouth every morning   Problem # 3:  DIABETES MELLITUS, TYPE II (ICD-250.00) Assessment: Unchanged  His updated medication list for this problem includes:    Adult Aspirin Low Strength 81 Mg Tbdp (Aspirin)    Zestril 10 Mg Tabs (Lisinopril) .Marland Kitchen... Take 1 tablet by mouth every morning  Orders: Venipuncture [id number]) TLB-BMP (Basic Metabolic Panel-BMET) (99991111) TLB-A1C / Hgb A1C (Glycohemoglobin) (83036-A1C) Venipuncture (36415) TLB-A1C / Hgb A1C (Glycohemoglobin) (83036-A1C) TLB-BMP (Basic Metabolic Panel-BMET) (99991111)   Problem # 4:  ANXIETY (ICD-300.00) Assessment: Deteriorated  The following medications were removed from the medication list:    Lexapro 10 Mg Tabs (Escitalopram oxalate)    Celexa 20 Mg Tabs (Citalopram hydrobromide) .Marland Kitchen... Take one tab at bedtime  His updated medication list for this problem includes:    Celexa 40 Mg Tabs (Citalopram hydrobromide) .Marland KitchenMarland KitchenMarland KitchenMarland Kitchen  1 tab @ bedtime   Complete Medication List: 1)  Flomax 0.4 Mg Cp24 (Tamsulosin hcl) 2)  Adult Aspirin Low Strength 81 Mg Tbdp (Aspirin) 3)  Lasix 20 Mg Tabs (Furosemide) .... Take 1 tablet by mouth every morning 4)  Zestril 10 Mg Tabs (Lisinopril) .... Take 1 tablet by mouth every morning 5)  Zocor 20 Mg Tabs (Simvastatin) .Marland Kitchen.. 1 tab @ bedtime 6)  Vitamin D 50000 Unit Caps (Ergocalciferol) .... Once daily 7)  Potassium Gluconate 550 Mg Tabs (Potassium gluconate) .... Once daily 8)  Sm Glucosamine Hcl 1500 Mg Tabs (Glucosamine hcl) .... Once daily 9)  Glucosamine-chondroitin 1500-1200 Mg/61ml Liqd (Glucosamine-chondroitin) .... Once daily 10)  Celexa 40 Mg Tabs (Citalopram hydrobromide) .Marland Kitchen.. 1 tab @ bedtime   Patient Instructions: 1)  start taking Zocor 20 mg  one tablet at bedtime.  Return the third week in September for a complete physical exam. 2)  Increased the Celexa from 20 mg a day to 40. 3)  Walk 20 minutes a day and continue other medications.  I will call you the first week in August to go for your lab work 4)  cbc, lipid panel, u/a ,tsh level, liver fcn panel,bmet, and psa prior to next visit (v70.0)    Prescriptions: ZESTRIL 10 MG  TABS (LISINOPRIL) Take 1 tablet by mouth every morning  #100 x 3   Entered and Authorized by:   Dorena Cookey MD   Signed by:   Dorena Cookey MD on 03/28/2008   Method used:   Print then Give to Patient   RxID:   [id number] ZOCOR 20 MG  TABS (SIMVASTATIN) 1 tab @ bedtime  #100 x 3   Entered and Authorized by:   Dorena Cookey MD   Signed by:   Dorena Cookey MD on 03/28/2008   Method used:   Print then Give to Patient   RxID:   [id number] LASIX 20 MG  TABS (FUROSEMIDE) Take 1 tablet by mouth every morning  #100 x 3   Entered and Authorized by:   Dorena Cookey MD   Signed by:   Dorena Cookey MD on 03/28/2008   Method used:   Print then Give to Patient   RxID:   [id number] FLOMAX 0.4 MG  CP24 (TAMSULOSIN HCL)   #100 x 3   Entered and Authorized by:   Dorena Cookey MD   Signed by:   Dorena Cookey MD on 03/28/2008   Method used:   Print then Give to Patient   RxID:   [id number] CELEXA 40 MG  TABS (CITALOPRAM HYDROBROMIDE) 1 tab @ bedtime  #100 x 3   Entered and Authorized by:   Dorena Cookey MD   Signed by:   Dorena Cookey MD on 03/28/2008   Method used:   Print then Give to Patient   RxID:   [id number]  ]

## 2010-10-14 NOTE — Assessment & Plan Note (Signed)
Summary: medical clearance - rv   Vital Signs:  Patient profile:   65 year old male Height:      70.5 inches Weight:      269.5 pounds Temp:     97.8 degrees F oral Pulse rate:   60 / minute BP sitting:   140 / 82  (left arm) Cuff size:   large  Vitals Entered By: Geanie Kenning LPN (December  8, 624THL 12:11 PM) CC: medical clearance for arthroscopic knee surgery   CC:  medical clearance for arthroscopic knee surgery.  History of Present Illness: well Keith [name] is a 65 year old male, nonsmoker, who comes in today for a surgical clearance for arthroscopy of his left knee.  He's had a long standing history of " arthritis " in his left knee and this past spring fell in her dyspnea.  Since that time.  It that pain is been fairly constant.  It pops, it makes noise, occasionally  a sensation of locking.  Dr. Maxie Better his orthopedist plans on scoping.  His knee cleaning out the arthritis and trimming the cartilage.  His medications were reviewed.  There been no changes since we saw him in the spring of 2011 for his annual physical exam.  Cardiac wise he takes Zestril 10 mg daily.  BP 140/82.  He also takes potassium supplement, one adult aspirin, Celexa 40 mg nightly, Zocor, 20 mg nightly, Flomax .4, Monday, Wednesday, Friday, Allegra 180 mg daily, and steroid nasal spray.  Seasonal flu shot 2010, Pneumovax 2009, tetanus, posterior 2011.  He has no complaints of chest pain, shortness of breath, et Ronney Asters.  He had a similar procedure done on his right knee many years ago, with no complications by Dr. Noemi Chapel   EKG enclosed with today's note and we will fax you his current labs.  Current Medications (verified): 1)  Flomax 0.4 Mg  Cp24 (Tamsulosin Hcl) .Marland Kitchen.. 1 Tab @ Bedtime 2)  Adult Aspirin Low Strength 81 Mg  Tbdp (Aspirin) 3)  Zestril 10 Mg  Tabs (Lisinopril) .... Take 1 Tablet By Mouth Every Morning 4)  Zocor 20 Mg  Tabs (Simvastatin) .Marland Kitchen.. 1 Tab @ Bedtime 5)  Vitamin D 50000 Unit  Caps  (Ergocalciferol) .... Once Daily 6)  Potassium Gluconate 550 Mg  Tabs (Potassium Gluconate) .... Once Daily 7)  Glucosamine-Chondroitin 1500-1200 Mg/70ml  Liqd (Glucosamine-Chondroitin) .... Once Daily 8)  Celexa 40 Mg  Tabs (Citalopram Hydrobromide) .Marland Kitchen.. 1 Tab @ Bedtime 9)  Cialis 20 Mg Tabs (Tadalafil) .... Uad 10)  Allegra 180 Mg Tabs (Fexofenadine Hcl) .... Take 1 Tablet By Mouth Every Morning 11)  Flonase 50 Mcg/act Susp (Fluticasone Propionate) .... Uad  Allergies (verified): 1)  ! Pcn  Comments:  Nurse/Medical Assistant: The patient's medications and allergies were reviewed with the patient and were updated in the Medication and Allergy Lists. Geanie Kenning LPN (December  8, 624THL 12:13 PM)  Past History:  Past medical, surgical, family and social histories (including risk factors) reviewed, and no changes noted (except as noted below).  Past Medical History: Reviewed history from 05/28/2008 and no changes required. Anxiety Diabetes mellitus, type II Hypertension Benign prostatic hypertrophy erectile dysfunction  Past Surgical History: Reviewed history from 07/09/2007 and no changes required. Colonoscopy Tonsillectomy  Family History: Reviewed history from 07/09/2007 and no changes required. Family History of Arthritis Family History Breast cancer 1st degree relative <50 Family History of CAD Male 1st degree relative <50 Family History Hypertension Family History of Skin cancer Family History of Cardiovascular disorder  Social History: Reviewed history from 05/28/2008 and no changes required. Occupation: Married Never Smoked Alcohol use-yes Regular exercise-yes two grandchildren, ages 57 and 80 live in [place]      See HPI  Physical Exam  General:  Well-developed,well-nourished,in no acute distress; alert,appropriate and cooperative throughout examination Lungs:  Normal respiratory effort, chest expands symmetrically. Lungs are  clear to auscultation, no crackles or wheezes. Heart:  Normal rate and regular rhythm. S1 and S2 normal without gallop, murmur, click, rub or other extra sounds. Abdomen:  Bowel sounds positive,abdomen soft and non-tender without masses, organomegaly or hernias noted.   Impression & Recommendations:  Problem # 1:  HYPERTENSION (ICD-401.9) Assessment Unchanged  His updated medication list for this problem includes:    Zestril 10 Mg Tabs (Lisinopril) .Marland Kitchen... Take 1 tablet by mouth every morning  Orders: Venipuncture [id number]) TLB-BMP (Basic Metabolic Panel-BMET) (99991111) TLB-CBC Platelet - w/Differential (85025-CBCD) TLB-Hepatic/Liver Function Pnl (80076-HEPATIC) TLB-TSH (Thyroid Stimulating Hormone) (84443-TSH) TLB-A1C / Hgb A1C (Glycohemoglobin) (83036-A1C) Specimen Handling (99000) EKG w/ Interpretation (93000)  Problem # 2:  DIABETES MELLITUS, TYPE II (ICD-250.00) Assessment: Improved  His updated medication list for this problem includes:    Adult Aspirin Low Strength 81 Mg Tbdp (Aspirin)    Zestril 10 Mg Tabs (Lisinopril) .Marland Kitchen... Take 1 tablet by mouth every morning  Orders: Venipuncture [id number]) TLB-BMP (Basic Metabolic Panel-BMET) (99991111) TLB-CBC Platelet - w/Differential (85025-CBCD) TLB-Hepatic/Liver Function Pnl (80076-HEPATIC) TLB-TSH (Thyroid Stimulating Hormone) (84443-TSH) TLB-A1C / Hgb A1C (Glycohemoglobin) (83036-A1C) Specimen Handling (99000) EKG w/ Interpretation (93000)  Complete Medication List: 1)  Flomax 0.4 Mg Cp24 (Tamsulosin hcl) .Marland Kitchen.. 1 tab @ bedtime 2)  Adult Aspirin Low Strength 81 Mg Tbdp (Aspirin) 3)  Zestril 10 Mg Tabs (Lisinopril) .... Take 1 tablet by mouth every morning 4)  Zocor 20 Mg Tabs (Simvastatin) .Marland Kitchen.. 1 tab @ bedtime 5)  Vitamin D 50000 Unit Caps (Ergocalciferol) .... Once daily 6)  Potassium Gluconate 550 Mg Tabs (Potassium gluconate) .... Once daily 7)  Glucosamine-chondroitin 1500-1200 Mg/14ml Liqd  (Glucosamine-chondroitin) .... Once daily 8)  Celexa 40 Mg Tabs (Citalopram hydrobromide) .Marland Kitchen.. 1 tab @ bedtime 9)  Cialis 20 Mg Tabs (Tadalafil) .... Uad 10)  Allegra 180 Mg Tabs (Fexofenadine hcl) .... Take 1 tablet by mouth every morning 11)  Flonase 50 Mcg/act Susp (Fluticasone propionate) .... Uad  Patient Instructions: 1)  I will send a copy of your lab work to Dr. Maxie Better tomorrow. 2)  Remember to hold aspirin and all aspirin products.  One week prior to your surgery   Orders Added: 1)  Venipuncture [36415] 2)  TLB-BMP (Basic Metabolic Panel-BMET) 123456 3)  TLB-CBC Platelet - w/Differential [85025-CBCD] 4)  TLB-Hepatic/Liver Function Pnl [80076-HEPATIC] 5)  TLB-TSH (Thyroid Stimulating Hormone) [84443-TSH] 6)  TLB-A1C / Hgb A1C (Glycohemoglobin) [83036-A1C] 7)  Est. Patient Level IV [id number] 8)  Specimen Handling [99000] 9)  EKG w/ Interpretation [93000]    Laboratory Results   Urine Tests    Routine Urinalysis   Color: yellow Appearance: Clear Glucose: negative   (Normal Range: Negative) Bilirubin: negative   (Normal Range: Negative) Ketone: negative   (Normal Range: Negative) Spec. Gravity: 1.010   (Normal Range: 1.003-1.035) Blood: negative   (Normal Range: Negative) pH: 6.0   (Normal Range: 5.0-8.0) Protein: negative   (Normal Range: Negative) Urobilinogen: 0.2   (Normal Range: 0-1) Nitrite: negative   (Normal Range: Negative) Leukocyte Esterace: negative   (Normal Range: Negative)    Comments: Joyce Gross  August 19, 2010 1:50 PM

## 2010-10-14 NOTE — Assessment & Plan Note (Signed)
Summary: CPX/NJR PT RSC/NJR rsc ok oer doc/njr/pt rescd//ccm   Vital Signs:  Patient profile:   65 year old male Height:      70.5 inches Weight:      274 pounds BMI:     38.90 Temp:     98.2 degrees F oral BP sitting:   110 / 68  (left arm) Cuff size:   large  Vitals Entered By: Keith [name] CMA Keith [name]) (December 11, 2009 10:25 AM)  CC: cpx Is Patient Diabetic? Yes   CC:  cpx.  History of Present Illness: Keith [name] is a 65 year old, married male, nonsmoker, who comes in today for evaluation of multiple issues.  He is a history of mild anxiety for which he takes Celexa 40 mg nightly, functioning well.  No side effects.  He is a history of hypertension, for which he takes Lasix 20 mg q.a.m. also Zestril, 10 mg q.a.m.  BP 110/68, and is having episodes of lightheadedness when he changes positions.  We discussed various options.  Will stop the Lasix.  He also takes Zocor 20 mg nightly for hyperlipidemia.  Lipids are golf with an LDL 57.  He also uses Cialis p.r.n. for ED 20 mg.  He also uses Flomax .4 daily for BPH.  He gets routine eye care.  Dental care.  Colonoscopy done.  GI, tetanus, 2000, Pneumovax 2009, seasonal flu 2010.  He needs a tetanus booster today.  Allergies: 1)  ! Pcn  Past History:  Past medical, surgical, family and social histories (including risk factors) reviewed, and no changes noted (except as noted below).  Past Medical History: Reviewed history from 05/28/2008 and no changes required. Anxiety Diabetes mellitus, type II Hypertension Benign prostatic hypertrophy erectile dysfunction  Past Surgical History: Reviewed history from 07/09/2007 and no changes required. Colonoscopy Tonsillectomy  Family History: Reviewed history from 07/09/2007 and no changes required. Family History of Arthritis Family History Breast cancer 1st degree relative <50 Family History of CAD Male 1st degree relative <50 Family History Hypertension Family History of  Skin cancer Family History of Cardiovascular disorder  Social History: Reviewed history from 05/28/2008 and no changes required. Occupation: Married Never Smoked Alcohol use-yes Regular exercise-yes two grandchildren, ages 64 and 72 live in [place]      See HPI  Physical Exam  General:  Well-developed,well-nourished,in no acute distress; alert,appropriate and cooperative throughout examination Head:  Normocephalic and atraumatic without obvious abnormalities. No apparent alopecia or balding. Eyes:  No corneal or conjunctival inflammation noted. EOMI. Perrla. Funduscopic exam benign, without hemorrhages, exudates or papilledema. Vision grossly normal. Ears:  External ear exam shows no significant lesions or deformities.  Otoscopic examination reveals clear canals, tympanic membranes are intact bilaterally without bulging, retraction, inflammation or discharge. Hearing is grossly normal bilaterally. Nose:  External nasal examination shows no deformity or inflammation. Nasal mucosa are pink and moist without lesions or exudates. Mouth:  Oral mucosa and oropharynx without lesions or exudates.  Teeth in good repair. Neck:  No deformities, masses, or tenderness noted. Chest Wall:  No deformities, masses, tenderness or gynecomastia noted. Breasts:  No masses or gynecomastia noted Lungs:  Normal respiratory effort, chest expands symmetrically. Lungs are clear to auscultation, no crackles or wheezes. Heart:  Normal rate and regular rhythm. S1 and S2 normal without gallop, murmur, click, rub or other extra sounds. Abdomen:  Bowel sounds positive,abdomen soft and non-tender without masses, organomegaly or hernias noted. Rectal:  No external abnormalities noted. Normal sphincter tone.  No rectal masses or tenderness. Genitalia:  Testes bilaterally descended without nodularity, tenderness or masses. No scrotal masses or lesions. No penis lesions or urethral  discharge. Prostate:  Prostate gland firm and smooth, no enlargement, nodularity, tenderness, mass, asymmetry or induration. Msk:  No deformity or scoliosis noted of thoracic or lumbar spine.   Pulses:  R and L carotid,radial,femoral,dorsalis pedis and posterior tibial pulses are full and equal bilaterally Extremities:  No clubbing, cyanosis, edema, or deformity noted with normal full range of motion of all joints.   Neurologic:  No cranial nerve deficits noted. Station and gait are normal. Plantar reflexes are down-going bilaterally. DTRs are symmetrical throughout. Sensory, motor and coordinative functions appear intact. Skin:  Intact without suspicious lesions or rashes....Marland Kitchenhe does have light, skin in line.  Eyes recommended Derm  consult.  He has 6 lesions on his for head, but looked irritated and red Cervical Nodes:  No lymphadenopathy noted Axillary Nodes:  No palpable lymphadenopathy Inguinal Nodes:  No significant adenopathy Psych:  Cognition and judgment appear intact. Alert and cooperative with normal attention span and concentration. No apparent delusions, illusions, hallucinations  Diabetes Management Exam:    Foot Exam (with socks and/or shoes not present):       Sensory-Pinprick/Light touch:          Left medial foot (L-4): normal          Left dorsal foot (L-5): normal          Left lateral foot (S-1): normal          Right medial foot (L-4): normal          Right dorsal foot (L-5): normal          Right lateral foot (S-1): normal       Sensory-Monofilament:          Left foot: normal          Right foot: normal       Inspection:          Left foot: normal          Right foot: normal       Nails:          Left foot: normal          Right foot: normal    Eye Exam:       Eye Exam done elsewhere          Date: 10/26/2009          Results: normal          Done by: opth   Complete Medication List: 1)  Flomax 0.4 Mg Cp24 (Tamsulosin hcl) .Marland Kitchen.. 1 tab @ bedtime 2)  Adult  Aspirin Low Strength 81 Mg Tbdp (Aspirin) 3)  Lasix 20 Mg Tabs (Furosemide) .... Take 1 tablet by mouth every morning 4)  Zestril 10 Mg Tabs (Lisinopril) .... Take 1 tablet by mouth every morning 5)  Zocor 20 Mg Tabs (Simvastatin) .Marland Kitchen.. 1 tab @ bedtime 6)  Vitamin D 50000 Unit Caps (Ergocalciferol) .... Once daily 7)  Potassium Gluconate 550 Mg Tabs (Potassium gluconate) .... Once daily 8)  Glucosamine-chondroitin 1500-1200 Mg/37ml Liqd (Glucosamine-chondroitin) .... Once daily 9)  Celexa 40 Mg Tabs (Citalopram hydrobromide) .Marland Kitchen.. 1 tab @ bedtime 10)  Cialis 20 Mg Tabs (Tadalafil) .... Uad 11)  Prednisone 20 Mg Tabs (Prednisone) .... Uad 12)  Vicodin Es 7.5-750 Mg Tabs (Hydrocodone-acetaminophen) .... Take 1 tablet by mouth four times a day as needed  Other Orders:  Prescription Created Electronically [phone]) Venipuncture [phone]) EKG w/ Interpretation (93000) Tdap => 60yrs IM [id number]) Admin 1st Vaccine (90471) TLB-A1C / Hgb A1C (Glycohemoglobin) (83036-A1C) TLB-Microalbumin/Creat Ratio, Urine (82043-MALB)  Patient Instructions: 1)  See your eye doctor yearly to check for diabetic eye damage. 2)  It is important that you exercise regularly at least 20 minutes 5 times a week. If you develop chest pain, have severe difficulty breathing, or feel very tired , stop exercising immediately and seek medical attention. 3)  Schedule a colonoscopy/sigmoidoscopy to help detect colon cancer. 4)  Take an Aspirin every day. 5)  Please schedule a follow-up appointment in 1 year. 6)  stop the Lasix and measure your blood pressure daily in the morning for 3 weeks.  The goal for your blood pressure is 4  systolic to be [id number] to [id number], diastolic 80 to 85.  If your blood pressure does not come up and you continued to feel lightheaded, call.  also remembered to take the Flomax at bedtime Prescriptions: CIALIS 20 MG TABS (TADALAFIL) UAD  #6 x 11   Entered and Authorized by:   Dorena Cookey MD   Signed by:   Dorena Cookey MD on 12/11/2009   Method used:   Electronically to        [facility] (retail)       [address], [place]  [id number]       Ph: [phone] or [phone]       Fax: [phone]   RxID:   [id number] CELEXA 40 MG  TABS (CITALOPRAM HYDROBROMIDE) 1 tab @ bedtime  #100 x 3   Entered and Authorized by:   Dorena Cookey MD   Signed by:   Dorena Cookey MD on 12/11/2009   Method used:   Electronically to        [facility] (retail)       [address], [place]  [id number]       Ph: [phone] or [phone]       Fax: [phone]   RxID:   [id number] ZOCOR 20 MG  TABS (SIMVASTATIN) 1 tab @ bedtime  #100 x 3   Entered and Authorized by:   Dorena Cookey MD   Signed by:   Dorena Cookey MD on 12/11/2009   Method used:   Electronically to        [facility] (retail)       [address], [place]  [id number]       Ph: [phone] or [phone]       Fax: [phone]   RxID:   [id number] ZESTRIL 10 MG  TABS (LISINOPRIL) Take 1 tablet by mouth every morning  #100 x 3   Entered and Authorized by:   Dorena Cookey MD   Signed by:   Dorena Cookey MD on 12/11/2009   Method used:   Electronically to        [facility] (retail)       [address], [place]  [id number]       Ph: [phone] or [phone]       Fax: [phone]   RxID:   [id number] FLOMAX 0.4 MG  CP24 (TAMSULOSIN HCL) 1 tab @ bedtime  #100 x 3   Entered and Authorized by:  Dorena Cookey MD   Signed by:   Dorena Cookey MD on 12/11/2009   Method used:   Electronically to        [facility] (retail)       [address], [place]  [id number]       Ph: [phone] or [phone]       Fax: [phone]   RxID:   [id number]    Immunization History:  Influenza Immunization History:    Influenza:  historical (06/12/2009)  Immunizations  Administered:  Tetanus Vaccine:    Vaccine Type: Tdap    Site: left deltoid    Mfr: GlaxoSmithK[employer]    Dose: 0.5 ml    Route: IM    Given by: Keith [name] CMA ([place])    Exp. Date: 12/04/2001    Lot #: [id number]    Physician counseled: yes EXP Date: 12/05/2011 Keith [name] CMA [facility])  December 11, 2009 11:19 AM

## 2010-10-14 NOTE — Progress Notes (Signed)
Summary: REQ FOR REFILL (Flomax)  Phone Note Call from Patient   Caller: Patient   Reason for Call: Refill Medication Summary of Call: Pt called to req a refill on med: FLOMAX 0.4 MG  CP24 (TAMSULOSIN HCL)..... Pt adv that Rx can be sent to [facility] on [facility].  Initial call taken by: Duanne Moron,  December 08, 2009 10:10 AM    Prescriptions: FLOMAX 0.4 MG  CP24 (TAMSULOSIN HCL) 2 by mouth qam  #60 x 0   Entered by:   Westley Hummer CMA ([place])   Authorized by:   Dorena Cookey MD   Signed by:   Westley Hummer CMA ([place]) on 12/08/2009   Method used:   Electronically to        [facility] (retail)       [address], [place]  [id number]       Ph: [phone] or [phone]       Fax: [phone]   RxID:   [id number]

## 2010-10-14 NOTE — Assessment & Plan Note (Signed)
Summary: sinus inf/cjr   Vital Signs:  Patient profile:   65 year old male Weight:      261 pounds Temp:     97.8 degrees F oral BP sitting:   110 / 78  (left arm) Cuff size:   regular  Vitals Entered By: Westley Hummer CMA [facility]) (Jan 28, 2010 11:21 AM) CC: sinus infection   CC:  sinus infection.  History of Present Illness: Keith [name] is a 65 year old, married male, nonsmoker, who comes in today for evaluation of two problems.  For the past 5 days has had congestion, postnasal drip nonproductive cough.  He had an episode about two months ago that lasted about two weeks and went away.  He does have a history of allergic rhinitis, for which he takes Allegra-D.  No urinary retention.  No history of asthma.  He also has decreased hearing in his right ear.  He thinks it may be  wax.  Allergies: 1)  ! Pcn PMH-FH-SH reviewed for relevance  Social History: Reviewed history from 05/28/2008 and no changes required. Occupation: Married Never Smoked Alcohol use-yes Regular exercise-yes two grandchildren, ages 28 and 24 live in [place]      See HPI  Physical Exam  General:  Well-developed,well-nourished,in no acute distress; alert,appropriate and cooperative throughout examination Head:  Normocephalic and atraumatic without obvious abnormalities. No apparent alopecia or balding. Eyes:  No corneal or conjunctival inflammation noted. EOMI. Perrla. Funduscopic exam benign, without hemorrhages, exudates or papilledema. Vision grossly normal. Ears:  left ear canal normal right ear canal.  Cerumen impaction removed via suction and by me Nose:  septum in the midline, 3+ nasal edema Mouth:  Oral mucosa and oropharynx without lesions or exudates.  Teeth in good repair. Neck:  No deformities, masses, or tenderness noted. Chest Wall:  No deformities, masses, tenderness or gynecomastia noted. Lungs:  Normal respiratory effort, chest expands symmetrically. Lungs are clear  to auscultation, no crackles or wheezes.   Problems:  Medical Problems Added: 1)  Dx of Cerumen Impaction  (ICD-380.4) 2)  Dx of Rhinitis  (ICD-477.9)  Impression & Recommendations:  Problem # 1:  CERUMEN IMPACTION (ICD-380.4) Assessment New  Problem # 2:  RHINITIS (O8228282.9) Assessment: Deteriorated  His updated medication list for this problem includes:    Allegra 180 Mg Tabs (Fexofenadine hcl) .Marland Kitchen... Take 1 tablet by mouth every morning    Flonase 50 Mcg/act Susp (Fluticasone propionate) ..... Uad  Complete Medication List: 1)  Flomax 0.4 Mg Cp24 (Tamsulosin hcl) .Marland Kitchen.. 1 tab @ bedtime 2)  Adult Aspirin Low Strength 81 Mg Tbdp (Aspirin) 3)  Lasix 20 Mg Tabs (Furosemide) .... Take 1 tablet by mouth every morning 4)  Zestril 10 Mg Tabs (Lisinopril) .... Take 1 tablet by mouth every morning 5)  Zocor 20 Mg Tabs (Simvastatin) .Marland Kitchen.. 1 tab @ bedtime 6)  Vitamin D 50000 Unit Caps (Ergocalciferol) .... Once daily 7)  Potassium Gluconate 550 Mg Tabs (Potassium gluconate) .... Once daily 8)  Glucosamine-chondroitin 1500-1200 Mg/56ml Liqd (Glucosamine-chondroitin) .... Once daily 9)  Celexa 40 Mg Tabs (Citalopram hydrobromide) .Marland Kitchen.. 1 tab @ bedtime 10)  Cialis 20 Mg Tabs (Tadalafil) .... Uad 11)  Prednisone 20 Mg Tabs (Prednisone) .... Uad 12)  Vicodin Es 7.5-750 Mg Tabs (Hydrocodone-acetaminophen) .... Take 1 tablet by mouth four times a day as needed 13)  Allegra 180 Mg Tabs (Fexofenadine hcl) .... Take 1 tablet by mouth every morning 14)  Flonase 50 Mcg/act Susp (Fluticasone propionate) .Marland KitchenMarland KitchenMarland Kitchen  Uad  Patient Instructions: 1)  take plain Allegra in the morning or Claritin or Zyrtec at bedtime.  Remember no D. 2)  Also, one shot of steroid nasal spray up each nostril at bedtime. 3)  You can use a combination of afrin nasal spray and saline irrigation with a netti pot at bedtime.  Remember....... a 5 nightly limit on the afrin  Prescriptions: FLONASE 50 MCG/ACT SUSP (FLUTICASONE PROPIONATE)  UAD  #1 x 5   Entered and Authorized by:   Dorena Cookey MD   Signed by:   Dorena Cookey MD on 01/28/2010   Method used:   Electronically to        [facility] (retail)       [address], [place]  [id number]       Ph: [phone] or [phone]       Fax: [phone]   RxID:   [id number] ALLEGRA 180 MG TABS (FEXOFENADINE HCL) Take 1 tablet by mouth every morning  #100 x 3   Entered and Authorized by:   Dorena Cookey MD   Signed by:   Dorena Cookey MD on 01/28/2010   Method used:   Electronically to        [facility] (retail)       [address], [place]  [id number]       Ph: [phone] or [phone]       Fax: [phone]   RxID:   [id number] Asencion Islam 50 MCG/ACT SUSP (FLUTICASONE PROPIONATE) UAD  #1 x 5   Entered and Authorized by:   Dorena Cookey MD   Signed by:   Dorena Cookey MD on 01/28/2010   Method used:   Electronically to        [facility]  [phone]* (retail)       [address]       [place], [place]  [address]       Ph: [phone] or [phone]       Fax: [phone]   RxID:   [id number] ALLEGRA 180 MG TABS (FEXOFENADINE HCL) Take 1 tablet by mouth every morning  #100 x 3   Entered and Authorized by:   Dorena Cookey MD   Signed by:   Dorena Cookey MD on 01/28/2010   Method used:   Electronically to        [facility]  [phone]* (retail)       [address]       [place], [place]  [address]       Ph: [phone] or [phone]       Fax: [phone]   RxID:   [id number]

## 2010-10-14 NOTE — Progress Notes (Signed)
  Phone Note Outgoing Call   Summary of Call: called and left message hemoglobin A1C7.4.  Advised diet, exercise, follow-up visit in two months fasting blood sugar and A1c prior to next visit Initial call taken by: Dorena Cookey MD,  December 11, 2009 3:00 PM

## 2010-10-14 NOTE — Progress Notes (Signed)
Summary: REFILLS  Phone Note Call from Patient Call back at [phone]   Caller: [place] Reason for Call: Refill Medication Summary of Call: RF CITALOPRAM AND CIALIS AT [facility]. PT IS OUT MEDS FOR 2 WEEKS. THESE WILL BE A NEW RX AT THIS PHARMACY. NEEDS TO TODAY. Initial call taken by: Despina Arias,  June 17, 2009 1:21 PM    Prescriptions: CIALIS 20 MG TABS (TADALAFIL) UAD  #6 x 6   Entered by:   Westley Hummer CMA ([place])   Authorized by:   Dorena Cookey MD   Signed by:   Westley Hummer CMA ([place]) on 06/18/2009   Method used:   Electronically to        [facility] (retail)       [address] [place], [place]  [id number]       Ph: [phone] or [phone]       Fax: [phone]   RxID:   [id number] 40 MG  TABS (CITALOPRAM HYDROBROMIDE) 1 tab @ bedtime  #100 x 2   Entered by:   Westley Hummer CMA ([place])   Authorized by:   Dorena Cookey MD   Signed by:   Westley Hummer CMA ([place]) on 06/18/2009   Method used:   Electronically to        [facility] (retail)       [address], [place]  [id number]       Ph: [phone] or [phone]       Fax: [phone]   RxID:   [id number]

## 2010-10-14 NOTE — Progress Notes (Signed)
Summary: pt req refill of Furosemide and Simvastatin  Phone Note Call from Patient Call back at Home Phone [phone]   Caller: Patient Summary of Call: Pt req refill of Furosemide and Simvastatin. Please call in to [facility]  Initial call taken by: Braulio Bosch,  August 26, 2009 9:51 AM    Prescriptions: LASIX 20 MG  TABS (FUROSEMIDE) Take 1 tablet by mouth every morning  #100 Each x 1   Entered by:   Westley Hummer CMA ([place])   Authorized by:   Dorena Cookey MD   Signed by:   Westley Hummer CMA ([place]) on 08/26/2009   Method used:   Electronically to        [facility] (retail)       [address] [place], [place]  [id number]       Ph: [phone] or [phone]       Fax: [phone]   RxID:   [id number] ZOCOR 20 MG  TABS (SIMVASTATIN) 1 tab @ bedtime  #100 Each x 1   Entered by:   Westley Hummer CMA ([place])   Authorized by:   Dorena Cookey MD   Signed by:   Westley Hummer CMA ([place]) on 08/26/2009   Method used:   Electronically to        [facility] (retail)       [address] [place], [place]  [id number]       Ph: [phone] or [phone]       Fax: [phone]   RxID:   [id number]

## 2010-10-14 NOTE — Progress Notes (Signed)
Summary: REQ FOR REFILL  Phone Note Call from Patient   Caller: Patient Reason for Call: Refill Medication Summary of Call: PT CALLED---ADV HE IS OOT IN [place]----REQ THAT RX FOR  (Flomax 0.4mg ) BE CALLED INTO ([facility] [address] [facility] [place].  [phone]). Initial call taken by: Duanne Moron,  August 05, 2009 2:32 PM    Prescriptions: FLOMAX 0.4 MG  CP24 (TAMSULOSIN HCL) 2 by mouth qam  #60 x 0   Entered by:   Deanna Artis CMA   Authorized by:   Dorena Cookey MD   Signed by:   Deanna Artis CMA on 08/05/2009   Method used:   Historical   [id number]  Pt notified and pharmacy in [place] called.

## 2010-10-14 NOTE — Assessment & Plan Note (Signed)
Summary: BLURRED VISION/HEADACHES-OK PER RACHAEL///CCM   Vital Signs:  Patient profile:   65 year old male Height:      72 inches Weight:      279 pounds BMI:     37.98 Temp:     97.7 degrees F oral BP sitting:   152 / 88  (left arm) Cuff size:   large  Vitals Entered By: Westley Hummer CMA (November 28, 2008 3:27 PM)  Reason for Visit Head ache, blurr vision, nausea, dizzy  History of Present Illness: Keith [name] is a 65 year old male comes in today for evaluation of a headache for 5 days.  Last Sunday, approximately 3 p.m. he developed a gradual onset of a headache.  He says is diffuse pain.  The pain is dull and throbbing, constant pain on a scale of one to 10.  This is a 3.  The pain does not radiate anywhere.  He denies any neurologic symptoms.  Is able to sleep but when he wakes up in the morning.  The headache is fair.  He, states he's never had a migraine headache.  The floor however, in the past.  He has had recurrent sinus type headaches.  These possibly have been many migraines.  Neurologic review of systems negative.  He has had some nausea, but no vomiting.  He also has phono and photo phobia  Medications Prior to Update: 1)  Flomax 0.4 Mg  Cp24 (Tamsulosin Hcl) .... 2 By Mouth Qam 2)  Adult Aspirin Low Strength 81 Mg  Tbdp (Aspirin) 3)  Lasix 20 Mg  Tabs (Furosemide) .... Take 1 Tablet By Mouth Every Morning 4)  Zestril 10 Mg  Tabs (Lisinopril) .... Take 1 Tablet By Mouth Every Morning 5)  Zocor 20 Mg  Tabs (Simvastatin) .Marland Kitchen.. 1 Tab @ Bedtime 6)  Vitamin D 50000 Unit  Caps (Ergocalciferol) .... Once Daily 7)  Potassium Gluconate 550 Mg  Tabs (Potassium Gluconate) .... Once Daily 8)  Glucosamine-Chondroitin 1500-1200 Mg/38ml  Liqd (Glucosamine-Chondroitin) .... Once Daily 9)  Celexa 40 Mg  Tabs (Citalopram Hydrobromide) .Marland Kitchen.. 1 Tab @ Bedtime 10)  Cialis 20 Mg Tabs (Tadalafil) .... Uad  Allergies: 1)  ! Pcn  Past History:  Past medical history reviewed for relevance to  current acute and chronic problems. Family history reviewed for relevance to current acute and chronic problems. Social history (including risk factors) reviewed for relevance to current acute and chronic problems.  Past Medical History:    Reviewed history from 05/28/2008 and no changes required:    Anxiety    Diabetes mellitus, type II    Hypertension    Benign prostatic hypertrophy    erectile dysfunction  Family History:    Reviewed history from 07/09/2007 and no changes required:       Family History of Arthritis       Family History Breast cancer 1st degree relative <50       Family History of CAD Male 1st degree relative <50       Family History Hypertension       Family History of Skin cancer       Family History of Cardiovascular disorder  Social History:    Reviewed history from 05/28/2008 and no changes required:       Occupation:       Married       Never Smoked       Alcohol use-yes       Regular exercise-yes       two grandchildren,  ages 65 and 3 live in [place]      See HPI  Physical Exam  General:  Well-developed,well-nourished,in no acute distress; alert,appropriate and cooperative throughout examination Head:  Normocephalic and atraumatic without obvious abnormalities. No apparent alopecia or balding. Eyes:  No corneal or conjunctival inflammation noted. EOMI. Perrla. Funduscopic exam benign, without hemorrhages, exudates or papilledema. Vision grossly normal. Ears:  External ear exam shows no significant lesions or deformities.  Otoscopic examination reveals clear canals, tympanic membranes are intact bilaterally without bulging, retraction, inflammation or discharge. Hearing is grossly normal bilaterally. Nose:  External nasal examination shows no deformity or inflammation. Nasal mucosa are pink and moist without lesions or exudates. Mouth:  Oral mucosa and oropharynx without lesions or exudates.  Teeth in good repair. Neck:  No  deformities, masses, or tenderness noted. Chest Wall:  No deformities, masses, tenderness or gynecomastia noted. Neurologic:  No cranial nerve deficits noted. Station and gait are normal. Plantar reflexes are down-going bilaterally. DTRs are symmetrical throughout. Sensory, motor and coordinative functions appear intact.   Impression & Recommendations:  Problem # 1:  MIGRAINE, CHRONIC (ICD-346.90) Assessment New  His updated medication list for this problem includes:    Adult Aspirin Low Strength 81 Mg Tbdp (Aspirin)    Vicodin Es 7.5-750 Mg Tabs (Hydrocodone-acetaminophen) .Marland Kitchen... Take 1 tablet by mouth four times a day as needed  Orders: Prescription Created Electronically [phone])  Complete Medication List: 1)  Flomax 0.4 Mg Cp24 (Tamsulosin hcl) .... 2 by mouth qam 2)  Adult Aspirin Low Strength 81 Mg Tbdp (Aspirin) 3)  Lasix 20 Mg Tabs (Furosemide) .... Take 1 tablet by mouth every morning 4)  Zestril 10 Mg Tabs (Lisinopril) .... Take 1 tablet by mouth every morning 5)  Zocor 20 Mg Tabs (Simvastatin) .Marland Kitchen.. 1 tab @ bedtime 6)  Vitamin D 50000 Unit Caps (Ergocalciferol) .... Once daily 7)  Potassium Gluconate 550 Mg Tabs (Potassium gluconate) .... Once daily 8)  Glucosamine-chondroitin 1500-1200 Mg/75ml Liqd (Glucosamine-chondroitin) .... Once daily 9)  Celexa 40 Mg Tabs (Citalopram hydrobromide) .Marland Kitchen.. 1 tab @ bedtime 10)  Cialis 20 Mg Tabs (Tadalafil) .... Uad 11)  Prednisone 20 Mg Tabs (Prednisone) .... Uad 12)  Vicodin Es 7.5-750 Mg Tabs (Hydrocodone-acetaminophen) .... Take 1 tablet by mouth four times a day as needed  Patient Instructions: 1)  begin prednisone, take two tablets daily until the headache stops then taper by taking one tablet daily for 3 days, a half a tablet a day for 3 days, then half a tablet every other day for a two week taper.  It may also take Vicodin one every 4 to 6 hours as needed for severe headache.  Return p.r.n. Prescriptions: VICODIN ES 7.5-750 MG  TABS (HYDROCODONE-ACETAMINOPHEN) Take 1 tablet by mouth four times a day as needed  #40 x 1   Entered and Authorized by:   Dorena Cookey MD   Signed by:   Dorena Cookey MD on 11/28/2008   Method used:   Print then Give to Patient   RxID:   [id number] PREDNISONE 20 MG TABS (PREDNISONE) UAD  #50 x 1   Entered and Authorized by:   Dorena Cookey MD   Signed by:   Dorena Cookey MD on 11/28/2008   Method used:   Electronically to        [facility]  [phone]* (retail)       [address]  [place],   [address]       Ph: [phone] or [phone]       Fax: [phone]   RxID:   [id number]

## 2010-11-22 LAB — POCT I-STAT 4, (NA,K, GLUC, HGB,HCT)
Glucose, Bld: 146 mg/dL — ABNORMAL HIGH (ref 70–99)
HCT: 46 % (ref 39.0–52.0)
Hemoglobin: 15.6 g/dL (ref 13.0–17.0)
Potassium: 4.1 mEq/L (ref 3.5–5.1)
Sodium: 141 mEq/L (ref 135–145)

## 2011-01-28 NOTE — Op Note (Signed)
NAME:  [name], Keith NO.:  [id number]   MEDICAL RECORD NO.:  [id number]          PATIENT TYPE:  OIB   LOCATION:  2853                         FACILITY:  [facility]   PHYSICIAN:  Clent Demark. Rankin, M.D.   DATE OF BIRTH:  [birth year]   DATE OF PROCEDURE:  06/30/2004  DATE OF DISCHARGE:                                 OPERATIVE REPORT   PREOPERATIVE DIAGNOSIS:  Rhegmatogenous retinal detachment of the right eye.   POSTOPERATIVE DIAGNOSIS:  Rhegmatogenous retinal detachment of the right  eye.   PROCEDURE:  1.  Scleral buckle using 240/70/287 elements OD.  2.  Retinal cryopexy OD.  3.  External drainage of subretinal fluid under direct observation with      ophthalmoscopy in the inferonasal quadrant.   SURGEON:  Clent Demark. Rankin, M.D.   ANESTHESIA:  General endotracheal anesthesia.   INDICATIONS FOR PROCEDURE:  The patient is a 65 year old man who has had  profound vision field loss over the last three weeks which has been  unremitting.  This is an attempt to reattach the retina.  He understands the  risks of anesthesia including the occurrence of death, loss to the eye,  including but not limited to hemorrhage, infection, scarring, need for  further surgery, no change in vision, loss of vision, progression of disease  despite intervention.  Appropriate signed consent was obtained.  He was  taken to the operating room.   DESCRIPTION OF PROCEDURE:  In the operating room, appropriate monitoring was  supplied by general endotracheal anesthesia.  The right periocular region  was sterilely prepped and draped in the usual ophthalmic fashion.  A lid  speculum was applied.  A conjunctival peritomy was then fashioned 360  degrees.  The rectus muscle was isolated on 2-0 silk ties.  Ophthalmoscopy  was then used to look for retinal tears.  The retinal tear appeared to be  found at the 2 o'clock position near the ora serrata.  There was extensive  peripheral chorioretinal atrophy  and degeneration with areas suspicious for  atrophic holes inferiorly and essentially confluent, retinal cryopexy was  placed in these areas along the posterior border of the chorioretinal  atrophy so as to avoid missing occult retinal holes.  At this time, a solid  silicone explant was then used, 287, to place under the medial inferolateral  rectus muscles with a 240 encircling band secured in the superotemporal  quadrant with a 70 Watzke sleeve.  Temporary placement of 5-0 Mersilene  mattress suture was placed in each quadrant, appropriate tension applied.  External drainage of subretinal fluid was carried out in the inferonasal  quadrant under direct observation.  No complications occurred.  Constant  pressure was maintained on the eye.  Buckle sutures were then tightened to  the appropriate height and tension.  All fluid was drained in this fashion.  No complications occurred.  At this time, the rectus stay 2-0 silk sutures  were removed and the conjunctiva and Tenon's were brought forward and closed  in layers with 7-0 Vicryl suture.  The bed of the buckle had been  irrigated  with bug-juice.  Intraocular pressure was assessed and found  to be adequate.  The retina was attached.  A subconjunctival injection of  steroid was applied.  Intravenous  Cipro was given for prophylaxis prior to  surgery.  A sterile patch and Fox shield were applied.  The patient was  taken to the recovery room in good, stable condition.       GAR/MEDQ  D:  06/30/2004  T:  06/30/2004  Job:  [id number]

## 2011-02-28 ENCOUNTER — Other Ambulatory Visit: Payer: Self-pay | Admitting: Family Medicine

## 2011-06-28 ENCOUNTER — Telehealth: Payer: Self-pay | Admitting: Family Medicine

## 2011-06-28 MED ORDER — CITALOPRAM HYDROBROMIDE 40 MG PO TABS: 40.0000 mg | ORAL_TABLET | Freq: Every day | ORAL | Status: DC

## 2011-06-28 NOTE — Telephone Encounter (Signed)
Pt requesting refill on Celexa 40 MG (citalopram)  Please send to [facility]

## 2011-07-11 ENCOUNTER — Ambulatory Visit (INDEPENDENT_AMBULATORY_CARE_PROVIDER_SITE_OTHER): Payer: BC Managed Care – PPO | Admitting: Family Medicine

## 2011-07-11 ENCOUNTER — Encounter: Payer: Self-pay | Admitting: Family Medicine

## 2011-07-11 DIAGNOSIS — E785 Hyperlipidemia, unspecified: Secondary | ICD-10-CM

## 2011-07-11 DIAGNOSIS — I10 Essential (primary) hypertension: Secondary | ICD-10-CM

## 2011-07-11 DIAGNOSIS — E119 Type 2 diabetes mellitus without complications: Secondary | ICD-10-CM

## 2011-07-11 LAB — BASIC METABOLIC PANEL
BUN: 14 mg/dL (ref 6–23)
CO2: 26 mEq/L (ref 19–32)
Calcium: 8.8 mg/dL (ref 8.4–10.5)
Chloride: 103 mEq/L (ref 96–112)
Creatinine, Ser: 0.8 mg/dL (ref 0.4–1.5)
GFR: 103.21 mL/min (ref >60.00)
Glucose, Bld: 103 mg/dL — ABNORMAL HIGH (ref 70–99)
Potassium: 4 mEq/L (ref 3.5–5.1)
Sodium: 137 mEq/L (ref 135–145)

## 2011-07-11 LAB — HEMOGLOBIN A1C: Hgb A1c MFr Bld: 7.5 % — ABNORMAL HIGH (ref 4.6–6.5)

## 2011-07-11 MED ORDER — SIMVASTATIN 20 MG PO TABS: 20.0000 mg | ORAL_TABLET | Freq: Every day | ORAL | Status: DC

## 2011-07-11 NOTE — Patient Instructions (Signed)
Continue your current medications.  Restart the simvastatin 20 mg once a day at bedtime.  See [place] in December for your annual physical exam

## 2011-07-11 NOTE — Progress Notes (Signed)
Addended by: Elmer Picker on: 07/11/2011 11:46 AM   Modules accepted: Orders

## 2011-07-11 NOTE — Progress Notes (Signed)
  Subjective:    Patient ID: Keith [name], male    DOB: 29-Sep-1945, 65 y.o.   MRN: [id number]  HPI Keith [name] is a 65 year old, married male, nonsmoker, who comes in today for medical clearance because he is going to have a total knee replacement.  He currently takes one aspirin tablet daily, Allegra, and steroid nasal spray for allergic rhinitis, Cialis p.r.n. For ED, Celexa 40 mg nightly for mild depression, and Flomax .4daily forBPH and outlet obstruction.  Six months ago.  His wife told him to stop the simvastatin ,,,so  he did.  He was not having side effects.  He has  A istory of diabetes, controlled with diet and was on simvastatin to lower his cholesterol, which it did.  I encouraged him to restart the simvastatin.  We will also check an A1c today.  Tetanus booster 2011, seasonal flu shot 2000 while, complete physical examination December 2011 otherwise normal including EKG.  He was also given lisinopril 10 mg daily for renal protection and blood pressure control.  However, we had to stop the lisinopril dropped his pressure too low.  BP today 120/88 Review of Systems    General metabolic review of systems otherwise negative Objective:   Physical Exam  He is a well-developed, slightly overweight male in no acute distress.  Cardiac, pulmonary, exam, normal.      Assessment & Plan:  Degenerative joint disease impending total knee replacement.  History of diabetes.  Check A1c today.  History of hyperlipidemia.  Restart simvastatin check labs in December after surgery

## 2011-07-19 ENCOUNTER — Encounter: Payer: Self-pay | Admitting: Physician Assistant

## 2011-07-19 ENCOUNTER — Other Ambulatory Visit: Payer: Self-pay | Admitting: Physician Assistant

## 2011-07-19 NOTE — H&P (Signed)
Keith [name] is an 65 y.o. male.   Chief Complaint: end stage DJD right knee HPI: 65 year old white male with history of right knee pain for years.  He has tried and failed cortisone shots, viscosupplimentation shot, NSAIDs and pain meds.  He now has end stage DJD of his right knee.  Past Medical History  Diagnosis Date  . Anxiety   . Diabetes mellitus type II   . Hypertension   . Benign prostatic hypertrophy   . ED (erectile dysfunction)     Past Surgical History  Procedure Date  . Colonoscopy   . Tonsillectomy   . Knee arthroscopy 09/07/2010    left knee  . Cataract extraction w/ intraocular lens  implant, bilateral 03/13/2003  . Retinal detachment repair w/ scleral buckle le 10/14/2002    Dr Zadie Rhine  . Knee arthroscopy 05/27/2000    left knee    Family History  Problem Relation Age of Onset  . Arthritis Other   . Cancer Other     breast, skin  . Coronary artery disease Other   . Heart disease Other   . Hypertension Mother   . Heart failure Father    Social History:  reports that he has never smoked. He does not have any smokeless tobacco history on file. He reports that he drinks about .6 ounces of alcohol per week. He reports that he does not use illicit drugs.  Allergies:  Allergies  Allergen Reactions  . Penicillins Anaphylaxis    Medications Prior to Admission  Medication Sig Dispense Refill  . aspirin 81 MG tablet Take 81 mg by mouth daily.        . celecoxib (CELEBREX) 200 MG capsule Take 200 mg by mouth daily.        . citalopram (CELEXA) 40 MG tablet Take 1 tablet (40 mg total) by mouth daily.  90 tablet  2  . fexofenadine (ALLEGRA) 180 MG tablet Take 180 mg by mouth daily.        . fluticasone (FLONASE) 50 MCG/ACT nasal spray Place 2 sprays into the nose daily.        Marland Kitchen lisinopril (PRINIVIL,ZESTRIL) 10 MG tablet Take 10 mg by mouth daily.        . Potassium Gluconate 550 MG TABS Take 2 tablets by mouth daily.       . simvastatin (ZOCOR) 20 MG tablet  Take 1 tablet (20 mg total) by mouth at bedtime.  100 tablet  0  . tadalafil (CIALIS) 20 MG tablet Take 20 mg by mouth as directed.        . Tamsulosin HCl (FLOMAX) 0.4 MG CAPS TAKE ONE CAPSULE AT BEDTIME  90 capsule  1   No current facility-administered medications on file as of 07/19/2011.    No results found for this or any previous visit (from the past 48 hour(s)). No results found.  Review of Systems  Constitutional: Negative.   HENT: Negative.  Negative for neck pain.   Eyes: Negative.   Respiratory: Negative.   Cardiovascular: Negative.   Gastrointestinal: Negative.   Genitourinary: Negative.   Musculoskeletal: Positive for joint pain. Negative for myalgias, back pain and falls.       Right knee pain severe.  Skin: Negative.   Neurological: Negative.   Endo/Heme/Allergies: Negative.   Psychiatric/Behavioral: Negative.   All other systems reviewed and are negative.    There were no vitals taken for this visit. Physical Exam  Vitals reviewed. Constitutional: He is oriented to  person, place, and time. He appears well-developed and well-nourished.  HENT:  Head: Normocephalic and atraumatic.  Nose: Nose normal.  Mouth/Throat: Oropharynx is clear and moist.  Eyes: Conjunctivae and EOM are normal. Pupils are equal, round, and reactive to light.  Neck: Normal range of motion. Neck supple.  Cardiovascular: Normal rate, regular rhythm, normal heart sounds and intact distal pulses.   Respiratory: Effort normal and breath sounds normal.  GI: Soft. Bowel sounds are normal.  Musculoskeletal:       Right knee -5 to 120 2 + crepitous 2+ synovitis 5/5 strength ligamentously stable, normal patella tracking  Left knee 0-120 2+ crepitous 1+synovitis well healed arthroscopy incisions ligamentously stable, normal patella tracking  2+ pitting edema bilaterally 2+dorsalis pedis pulses bilaterally Bilaterally equal motor and sensory exam   Neurological: He is alert and oriented to  person, place, and time. He has normal reflexes.  Skin: Skin is warm and dry.  Psychiatric: He has a normal mood and affect. His behavior is normal. Judgment and thought content normal.    Xrays:  05/27/2011 taken at [facility].  AP, AP with 30degree flexion, lateral and sunrise show end stage DJD.   Assessment/Plan End stage DJD right knee Anxiety Diabetes mellitus type II Hypertension Benign prostatic hypertrophy ED (erectile dysfunction)  The risks,  Benefits, and possible complications of this surgery were discussed in detail with this patient.  He is without question.  We will plan on a right total knee replacement on November 19 at 7:15 am.   Linda Hedges 07/19/2011, 5:15 PM

## 2011-07-20 ENCOUNTER — Other Ambulatory Visit: Payer: Self-pay | Admitting: Physician Assistant

## 2011-07-20 ENCOUNTER — Encounter: Payer: Self-pay | Admitting: Physician Assistant

## 2011-07-20 DIAGNOSIS — M179 Osteoarthritis of knee, unspecified: Secondary | ICD-10-CM | POA: Insufficient documentation

## 2011-07-20 DIAGNOSIS — M1710 Unilateral primary osteoarthritis, unspecified knee: Secondary | ICD-10-CM

## 2011-07-20 NOTE — H&P (Signed)
Keith [name] is an 65 y.o. male.   Chief Complaint: right knee end stage djd  HPI: 65 year old white male with history of knee pain for years.  He has tried and failed intraarticular cortisone shot, viscosupplementation shots, NSAIDs and pain meds.  Now ready to have total knee replacement.  Past Medical History  Diagnosis Date  . Anxiety   . Diabetes mellitus type II   . Hypertension   . Benign prostatic hypertrophy   . ED (erectile dysfunction)   . Arthritis   . Sleep apnea     Past Surgical History  Procedure Date  . Colonoscopy   . Tonsillectomy   . Knee arthroscopy 09/07/2010    left knee  . Cataract extraction w/ intraocular lens  implant, bilateral 03/13/2003  . Retinal detachment repair w/ scleral buckle le 10/14/2002    Dr Zadie Rhine  . Knee arthroscopy 05/27/2000    left knee    Family History  Problem Relation Age of Onset  . Arthritis Other   . Cancer Other     breast, skin  . Coronary artery disease Other   . Heart disease Other   . Hypertension Mother   . Heart failure Father    Social History:  reports that he has never smoked. He does not have any smokeless tobacco history on file. He reports that he drinks about .6 ounces of alcohol per week. He reports that he does not use illicit drugs.  Allergies:  Allergies  Allergen Reactions  . Penicillins Anaphylaxis    Medications Prior to Admission  Medication Sig Dispense Refill  . aspirin 81 MG tablet Take 81 mg by mouth daily.        . celecoxib (CELEBREX) 200 MG capsule Take 200 mg by mouth daily.        . citalopram (CELEXA) 40 MG tablet Take 1 tablet (40 mg total) by mouth daily.  90 tablet  2  . fexofenadine (ALLEGRA) 180 MG tablet Take 180 mg by mouth daily.        . fluticasone (FLONASE) 50 MCG/ACT nasal spray Place 2 sprays into the nose daily.        Marland Kitchen lisinopril (PRINIVIL,ZESTRIL) 10 MG tablet Take 10 mg by mouth daily.        . Potassium Gluconate 550 MG TABS Take 2 tablets by mouth daily.        . simvastatin (ZOCOR) 20 MG tablet Take 1 tablet (20 mg total) by mouth at bedtime.  100 tablet  0  . tadalafil (CIALIS) 20 MG tablet Take 20 mg by mouth as directed.        . Tamsulosin HCl (FLOMAX) 0.4 MG CAPS TAKE ONE CAPSULE AT BEDTIME  90 capsule  1   No current facility-administered medications on file as of 07/20/2011.    No results found for this or any previous visit (from the past 48 hour(s)). No results found.  Review of Systems  Constitutional: Negative.   HENT: Negative.  Negative for neck pain.   Eyes: Positive for blurred vision. Negative for double vision, photophobia, pain, discharge and redness.       Cataracts  Respiratory: Negative for cough, hemoptysis, sputum production, shortness of breath and wheezing.   Cardiovascular: Positive for leg swelling. Negative for chest pain, palpitations, orthopnea, claudication and PND.  Gastrointestinal: Negative.   Genitourinary: Positive for frequency. Negative for dysuria, urgency, hematuria and flank pain.       Nocturia  Musculoskeletal: Positive for joint pain.  Negative for myalgias, back pain and falls.       Right knee  Skin: Negative.   Neurological: Negative.   Endo/Heme/Allergies: Negative for environmental allergies and polydipsia. Does not bruise/bleed easily.  Psychiatric/Behavioral: Negative.     Blood pressure 151/83, pulse 63, temperature 98.2 F (36.8 C), temperature source Oral, height 6' (1.829 m), weight 119.296 kg (263 lb), SpO2 95.00%. Physical Exam  Constitutional: He is oriented to person, place, and time. He appears well-developed and well-nourished.  HENT:  Head: Normocephalic and atraumatic.  Nose: Nose normal.  Mouth/Throat: Oropharynx is clear and moist.  Eyes: Conjunctivae and EOM are normal. Pupils are equal, round, and reactive to light.  Neck: Normal range of motion. Neck supple. No JVD present.  Cardiovascular: Normal rate, regular rhythm, normal heart sounds and intact distal pulses.     No murmur heard. Respiratory: Effort normal and breath sounds normal. He has no wheezes. He has no rales. He exhibits no tenderness.  GI: Soft. Bowel sounds are normal. He exhibits no distension. There is no tenderness.  Genitourinary:       Not pertinent to current symptomatology therefore not examined.  Musculoskeletal: He exhibits edema.       Right knee -5 to 120 2 + crepitous 2+ synovitis 5/5 strength ligamentously stable, normal patella tracking  Left knee 0-120 2+ crepitous 1+synovitis well healed arthroscopy incisions ligamentously stable, normal patella tracking  2+ pitting edema bilaterally 2+dorsalis pedis pulses bilaterally Bilaterally equal motor and sensory exam  Neurological: He is alert and oriented to person, place, and time. He has normal reflexes.  Skin: Skin is warm and dry.  Psychiatric: He has a normal mood and affect. His behavior is normal. Judgment and thought content normal.     Assessment Patient Active Problem List  Diagnoses  . DIABETES MELLITUS, TYPE II  . ANXIETY  . ERECTILE DYSFUNCTION, MILD  . MIGRAINE, CHRONIC  . HYPERTENSION  . RHINITIS  . BENIGN PROSTATIC HYPERTROPHY  . Hyperlipidemia  . DJD (degenerative joint disease) of knee   Plan The risks, benefits, and possible complications of the total knee replacement procedure were discussed in detail with the patient.  The patient is without question. We will plan on a total knee replacement by Dr Noemi Chapel on 08/01/2011 at 7:15 am.  Cassundra Mckeever A. Kaleen Mask Physician Assistant [facility] [phone]  07/20/2011, 1:24 PM  Linda Hedges 07/20/2011, 1:10 PM

## 2011-07-25 ENCOUNTER — Encounter (HOSPITAL_COMMUNITY): Payer: Self-pay | Admitting: Pharmacy Technician

## 2011-07-26 ENCOUNTER — Encounter (HOSPITAL_COMMUNITY)
Admission: RE | Admit: 2011-07-26 | Discharge: 2011-07-26 | Disposition: A | Discharge status: Home / Self Care | Payer: BC Managed Care – PPO | Source: Ambulatory Visit | Attending: Orthopedic Surgery | Admitting: Orthopedic Surgery

## 2011-07-26 ENCOUNTER — Ambulatory Visit (HOSPITAL_COMMUNITY): Admission: RE | Admit: 2011-07-26 | Payer: BC Managed Care – PPO | Source: Ambulatory Visit

## 2011-07-26 ENCOUNTER — Other Ambulatory Visit (HOSPITAL_COMMUNITY): Payer: Self-pay | Admitting: Physician Assistant

## 2011-07-26 ENCOUNTER — Encounter (HOSPITAL_COMMUNITY): Payer: Self-pay | Admitting: Pharmacy Technician

## 2011-07-26 ENCOUNTER — Encounter (HOSPITAL_COMMUNITY): Payer: Self-pay

## 2011-07-26 DIAGNOSIS — M179 Osteoarthritis of knee, unspecified: Secondary | ICD-10-CM

## 2011-07-26 DIAGNOSIS — M1710 Unilateral primary osteoarthritis, unspecified knee: Secondary | ICD-10-CM

## 2011-07-26 HISTORY — DX: Adverse effect of unspecified anesthetic, initial encounter: T41.45XA

## 2011-07-26 HISTORY — DX: Other complications of anesthesia, initial encounter: T88.59XA

## 2011-07-26 HISTORY — DX: Hyperlipidemia, unspecified: E78.5

## 2011-07-26 LAB — URINALYSIS, ROUTINE W REFLEX MICROSCOPIC
Bilirubin Urine: NEGATIVE
Glucose, UA: NEGATIVE mg/dL
Hgb urine dipstick: NEGATIVE
Ketones, ur: NEGATIVE mg/dL
Leukocytes, UA: NEGATIVE
Nitrite: NEGATIVE
Protein, ur: NEGATIVE mg/dL
Specific Gravity, Urine: 1.03 (ref 1.005–1.030)
Urobilinogen, UA: 0.2 mg/dL (ref 0.0–1.0)
pH: 5.5 (ref 5.0–8.0)

## 2011-07-26 LAB — COMPREHENSIVE METABOLIC PANEL
ALT: 19 U/L (ref 0–53)
AST: 17 U/L (ref 0–37)
Albumin: 3.9 g/dL (ref 3.5–5.2)
Alkaline Phosphatase: 63 U/L (ref 39–117)
BUN: 18 mg/dL (ref 6–23)
CO2: 27 mEq/L (ref 19–32)
Calcium: 9.7 mg/dL (ref 8.4–10.5)
Chloride: 105 mEq/L (ref 96–112)
Creatinine, Ser: 0.82 mg/dL (ref 0.50–1.35)
GFR calc Af Amer: >90 mL/min (ref >90)
GFR calc non Af Amer: >90 mL/min (ref >90)
Glucose, Bld: 135 mg/dL — ABNORMAL HIGH (ref 70–99)
Potassium: 4.7 mEq/L (ref 3.5–5.1)
Sodium: 141 mEq/L (ref 135–145)
Total Bilirubin: 0.5 mg/dL (ref 0.3–1.2)
Total Protein: 6.9 g/dL (ref 6.0–8.3)

## 2011-07-26 LAB — CBC
HCT: 45.9 % (ref 39.0–52.0)
Hemoglobin: 15.3 g/dL (ref 13.0–17.0)
MCH: 30.4 pg (ref 26.0–34.0)
MCHC: 33.3 g/dL (ref 30.0–36.0)
MCV: 91.3 fL (ref 78.0–100.0)
Platelets: 247 10*3/uL (ref 150–400)
RBC: 5.03 MIL/uL (ref 4.22–5.81)
RDW: 13.5 % (ref 11.5–15.5)
WBC: 7.4 10*3/uL (ref 4.0–10.5)

## 2011-07-26 LAB — APTT: aPTT: 26 seconds (ref 24–37)

## 2011-07-26 LAB — DIFFERENTIAL
Basophils Absolute: 0 10*3/uL (ref 0.0–0.1)
Basophils Relative: 0 % (ref 0–1)
Eosinophils Absolute: 0.3 10*3/uL (ref 0.0–0.7)
Eosinophils Relative: 4 % (ref 0–5)
Lymphocytes Relative: 22 % (ref 12–46)
Lymphs Abs: 1.6 10*3/uL (ref 0.7–4.0)
Monocytes Absolute: 0.7 10*3/uL (ref 0.1–1.0)
Monocytes Relative: 10 % (ref 3–12)
Neutro Abs: 4.8 10*3/uL (ref 1.7–7.7)
Neutrophils Relative %: 64 % (ref 43–77)

## 2011-07-26 LAB — PROTIME-INR
INR: 0.99 (ref 0.00–1.49)
Prothrombin Time: 13.3 seconds (ref 11.6–15.2)

## 2011-07-26 LAB — SURGICAL PCR SCREEN
MRSA, PCR: NEGATIVE
Staphylococcus aureus: NEGATIVE

## 2011-07-26 NOTE — Progress Notes (Signed)
Stress test done 66yrs ago;no echo done,no heart catherization

## 2011-07-26 NOTE — Pre-Procedure Instructions (Signed)
[place]  07/26/2011   Your procedure is scheduled on: Mon, Nov 19 @ 0715 Report to [place] at [place].  Call this number if you have problems the morning of surgery: [phone]   Remember:   Do not eat food:After Midnight.  Do not drink clear liquids: 4 Hours before arrival.May have soda,tea,black coffee,apple/grape juice,water,or broth until 0130  Take these medicines the morning of surgery with A SIP OF WATER: Celexa,,Allegra,Flonase   Do not wear jewelry, make-up or nail polish.  Do not wear lotions, powders, or perfumes. You may wear deodorant.  Do not shave 48 hours prior to surgery.  Do not bring valuables to the hospital.  Contacts, dentures or bridgework may not be worn into surgery.  Leave suitcase in the car. After surgery it may be brought to your room.  For patients admitted to the hospital, checkout time is 11:00 AM the day of discharge.   Patients discharged the day of surgery will not be allowed to drive home.  Name and phone number of your driver:   Special Instructions: CHG Shower Use Special Wash: 1/2 bottle night before surgery and 1/2 bottle morning of surgery.   Please read over the following fact sheets that you were given: Pain Booklet, Coughing and Deep Breathing, Blood Transfusion Information, Total Joint Packet, MRSA Information and Surgical Site Infection Prevention

## 2011-07-27 LAB — URINE CULTURE
Colony Count: NO GROWTH
Culture  Setup Time: 201211131044
Culture: NO GROWTH
Special Requests: NORMAL

## 2011-07-28 ENCOUNTER — Encounter (HOSPITAL_COMMUNITY): Payer: Self-pay | Admitting: Vascular Surgery

## 2011-07-28 NOTE — Consult Note (Signed)
Anesthesia:  65 year old male for right TKA.  Hx of HTN, BPH, ED, hyperlipidemia, OSA, anxiety.  Asked to review last EKGs from 08/19/10 and 09/10/10.  Both show inferior (III,avF) T wave inversion.  He has since tolerated a left knee arthroscopy.  He has had no recent heart studies (stress test was 7 years ago).  An old EKG from 2005 also shows T wave inversion in lead III, but more non-specific changes in avF.  He did see his PCP Dr. Sherren Mocha on 07/11/11 for preoperative clearance.  I reviewed above with Dr. Marcie Bal.  Clinical correlation DOS, but if asymptomatic then plan to proceed.

## 2011-07-31 MED ORDER — LACTATED RINGERS IV SOLN
INTRAVENOUS | Status: DC
  Administered 2011-08-01 (×2): via INTRAVENOUS

## 2011-07-31 MED ORDER — VANCOMYCIN HCL 1000 MG IV SOLR
1500.0000 mg | INTRAVENOUS | Status: AC
  Administered 2011-08-01: 1500 mg via INTRAVENOUS
  Filled 2011-07-31: qty 1500

## 2011-08-01 ENCOUNTER — Encounter (HOSPITAL_COMMUNITY): Payer: Self-pay | Admitting: Vascular Surgery

## 2011-08-01 ENCOUNTER — Inpatient Hospital Stay (HOSPITAL_COMMUNITY)
Admission: RE | Admit: 2011-08-01 | Discharge: 2011-08-03 | DRG: 209 | Disposition: A | Discharge status: Home Health | Payer: BC Managed Care – PPO | Source: Ambulatory Visit | Attending: Orthopedic Surgery | Admitting: Orthopedic Surgery

## 2011-08-01 ENCOUNTER — Encounter (HOSPITAL_COMMUNITY): Payer: Self-pay | Admitting: *Deleted

## 2011-08-01 ENCOUNTER — Inpatient Hospital Stay (HOSPITAL_COMMUNITY): Payer: BC Managed Care – PPO | Admitting: Vascular Surgery

## 2011-08-01 ENCOUNTER — Encounter (HOSPITAL_COMMUNITY)
Admission: RE | Disposition: A | Discharge status: Home Health | Payer: Self-pay | Source: Ambulatory Visit | Attending: Orthopedic Surgery

## 2011-08-01 DIAGNOSIS — N529 Male erectile dysfunction, unspecified: Secondary | ICD-10-CM | POA: Diagnosis present

## 2011-08-01 DIAGNOSIS — E119 Type 2 diabetes mellitus without complications: Secondary | ICD-10-CM | POA: Diagnosis present

## 2011-08-01 DIAGNOSIS — Z96651 Presence of right artificial knee joint: Secondary | ICD-10-CM | POA: Insufficient documentation

## 2011-08-01 DIAGNOSIS — I152 Hypertension secondary to endocrine disorders: Secondary | ICD-10-CM | POA: Insufficient documentation

## 2011-08-01 DIAGNOSIS — N400 Enlarged prostate without lower urinary tract symptoms: Secondary | ICD-10-CM | POA: Diagnosis present

## 2011-08-01 DIAGNOSIS — Z7982 Long term (current) use of aspirin: Secondary | ICD-10-CM

## 2011-08-01 DIAGNOSIS — Z961 Presence of intraocular lens: Secondary | ICD-10-CM

## 2011-08-01 DIAGNOSIS — D62 Acute posthemorrhagic anemia: Secondary | ICD-10-CM | POA: Diagnosis not present

## 2011-08-01 DIAGNOSIS — Z79899 Other long term (current) drug therapy: Secondary | ICD-10-CM

## 2011-08-01 DIAGNOSIS — F411 Generalized anxiety disorder: Secondary | ICD-10-CM | POA: Insufficient documentation

## 2011-08-01 DIAGNOSIS — M179 Osteoarthritis of knee, unspecified: Secondary | ICD-10-CM | POA: Diagnosis present

## 2011-08-01 DIAGNOSIS — E1159 Type 2 diabetes mellitus with other circulatory complications: Secondary | ICD-10-CM | POA: Insufficient documentation

## 2011-08-01 DIAGNOSIS — M1710 Unilateral primary osteoarthritis, unspecified knee: Principal | ICD-10-CM | POA: Diagnosis present

## 2011-08-01 DIAGNOSIS — G4730 Sleep apnea, unspecified: Secondary | ICD-10-CM | POA: Diagnosis present

## 2011-08-01 DIAGNOSIS — I10 Essential (primary) hypertension: Secondary | ICD-10-CM | POA: Diagnosis present

## 2011-08-01 HISTORY — PX: TOTAL KNEE ARTHROPLASTY: SHX125

## 2011-08-01 LAB — TYPE AND SCREEN
ABO/RH(D): A POS
Antibody Screen: NEGATIVE

## 2011-08-01 LAB — ABO/RH: ABO/RH(D): A POS

## 2011-08-01 SURGERY — ARTHROPLASTY, KNEE, TOTAL
Anesthesia: General | Site: Knee | Laterality: Right | Wound class: Clean

## 2011-08-01 MED ORDER — CITALOPRAM HYDROBROMIDE 40 MG PO TABS
40.0000 mg | ORAL_TABLET | Freq: Every day | ORAL | Status: DC
  Administered 2011-08-01 – 2011-08-03 (×3): 40 mg via ORAL
  Filled 2011-08-01 (×3): qty 1

## 2011-08-01 MED ORDER — SUFENTANIL CITRATE 50 MCG/ML IV SOLN
INTRAVENOUS | Status: DC | PRN
  Administered 2011-08-01 (×3): 10 ug via INTRAVENOUS
  Administered 2011-08-01: 20 ug via INTRAVENOUS

## 2011-08-01 MED ORDER — PROPOFOL 10 MG/ML IV EMUL
INTRAVENOUS | Status: DC | PRN
  Administered 2011-08-01: 200 mg via INTRAVENOUS

## 2011-08-01 MED ORDER — OXYCODONE HCL 5 MG PO TABS
5.0000 mg | ORAL_TABLET | ORAL | Status: DC | PRN
  Administered 2011-08-01 – 2011-08-03 (×2): 10 mg via ORAL
  Filled 2011-08-01 (×2): qty 2

## 2011-08-01 MED ORDER — MIDAZOLAM HCL 5 MG/5ML IJ SOLN
INTRAMUSCULAR | Status: DC | PRN
  Administered 2011-08-01: 2 mg via INTRAVENOUS

## 2011-08-01 MED ORDER — HYDROMORPHONE HCL PF 1 MG/ML IJ SOLN
0.2500 mg | INTRAMUSCULAR | Status: DC | PRN
  Administered 2011-08-01 (×2): 0.5 mg via INTRAVENOUS

## 2011-08-01 MED ORDER — ACETAMINOPHEN 10 MG/ML IV SOLN
INTRAVENOUS | Status: DC | PRN
  Administered 2011-08-01: 1000 mg via INTRAVENOUS

## 2011-08-01 MED ORDER — DOCUSATE SODIUM 100 MG PO CAPS
100.0000 mg | ORAL_CAPSULE | Freq: Two times a day (BID) | ORAL | Status: DC
  Administered 2011-08-01 – 2011-08-03 (×4): 100 mg via ORAL
  Filled 2011-08-01 (×5): qty 1

## 2011-08-01 MED ORDER — ONDANSETRON HCL 4 MG PO TABS: 4.0000 mg | ORAL_TABLET | Freq: Four times a day (QID) | ORAL | Status: DC | PRN

## 2011-08-01 MED ORDER — BUPIVACAINE-EPINEPHRINE PF 0.5-1:200000 % IJ SOLN
INTRAMUSCULAR | Status: DC | PRN
  Administered 2011-08-01: 30 mL

## 2011-08-01 MED ORDER — PHENOL 1.4 % MT LIQD
1.0000 | OROMUCOSAL | Status: DC | PRN
  Filled 2011-08-01: qty 177

## 2011-08-01 MED ORDER — MAGNESIUM HYDROXIDE 400 MG/5ML PO SUSP
30.0000 mL | Freq: Two times a day (BID) | ORAL | Status: DC | PRN
  Administered 2011-08-03: 30 mL via ORAL
  Filled 2011-08-01: qty 30

## 2011-08-01 MED ORDER — ROCURONIUM BROMIDE 100 MG/10ML IV SOLN
INTRAVENOUS | Status: DC | PRN
  Administered 2011-08-01: 50 mg via INTRAVENOUS

## 2011-08-01 MED ORDER — POTASSIUM CHLORIDE IN NACL 20-0.9 MEQ/L-% IV SOLN
100.0000 mL/h | INTRAVENOUS | Status: DC
  Administered 2011-08-01: 100 mL/h via INTRAVENOUS
  Filled 2011-08-01 (×6): qty 1000

## 2011-08-01 MED ORDER — ONDANSETRON HCL 4 MG/2ML IJ SOLN
INTRAMUSCULAR | Status: DC | PRN
  Administered 2011-08-01: 4 mg via INTRAVENOUS

## 2011-08-01 MED ORDER — VANCOMYCIN HCL IN DEXTROSE 1-5 GM/200ML-% IV SOLN
1000.0000 mg | Freq: Two times a day (BID) | INTRAVENOUS | Status: AC
  Administered 2011-08-01: 1000 mg via INTRAVENOUS
  Filled 2011-08-01: qty 200

## 2011-08-01 MED ORDER — POLYETHYLENE GLYCOL 3350 17 G PO PACK
17.0000 g | PACK | Freq: Every day | ORAL | Status: DC | PRN
  Filled 2011-08-01: qty 1

## 2011-08-01 MED ORDER — ONDANSETRON HCL 4 MG/2ML IJ SOLN: 4.0000 mg | Freq: Four times a day (QID) | INTRAMUSCULAR | Status: DC | PRN

## 2011-08-01 MED ORDER — TAMSULOSIN HCL 0.4 MG PO CAPS
0.4000 mg | ORAL_CAPSULE | ORAL | Status: DC
  Administered 2011-08-01 – 2011-08-02 (×2): 0.4 mg via ORAL
  Filled 2011-08-01 (×2): qty 1

## 2011-08-01 MED ORDER — DEXAMETHASONE SODIUM PHOSPHATE 4 MG/ML IJ SOLN
INTRAMUSCULAR | Status: DC | PRN
  Administered 2011-08-01: 4 mg via INTRAVENOUS

## 2011-08-01 MED ORDER — DROPERIDOL 2.5 MG/ML IJ SOLN: 0.6250 mg | INTRAMUSCULAR | Status: DC | PRN

## 2011-08-01 MED ORDER — CELECOXIB 200 MG PO CAPS
200.0000 mg | ORAL_CAPSULE | Freq: Two times a day (BID) | ORAL | Status: DC
  Administered 2011-08-01 – 2011-08-03 (×5): 200 mg via ORAL
  Filled 2011-08-01 (×6): qty 1

## 2011-08-01 MED ORDER — ZOLPIDEM TARTRATE 5 MG PO TABS: 5.0000 mg | ORAL_TABLET | Freq: Every evening | ORAL | Status: DC | PRN

## 2011-08-01 MED ORDER — DIPHENHYDRAMINE HCL 12.5 MG/5ML PO ELIX
12.5000 mg | ORAL_SOLUTION | ORAL | Status: DC | PRN
  Filled 2011-08-01: qty 10

## 2011-08-01 MED ORDER — METOCLOPRAMIDE HCL 5 MG/ML IJ SOLN
5.0000 mg | Freq: Three times a day (TID) | INTRAMUSCULAR | Status: DC | PRN
  Filled 2011-08-01: qty 2

## 2011-08-01 MED ORDER — OXYCODONE-ACETAMINOPHEN 5-325 MG PO TABS
1.0000 | ORAL_TABLET | ORAL | Status: DC | PRN
  Administered 2011-08-02 – 2011-08-03 (×5): 2 via ORAL
  Filled 2011-08-01 (×5): qty 2

## 2011-08-01 MED ORDER — TOBRAMYCIN SULFATE 1.2 G IJ SOLR
INTRAMUSCULAR | Status: DC | PRN
  Administered 2011-08-01: 1.2 g

## 2011-08-01 MED ORDER — SODIUM CHLORIDE 0.9 % IR SOLN
Status: DC | PRN
  Administered 2011-08-01: 3000 mL

## 2011-08-01 MED ORDER — ENOXAPARIN SODIUM 30 MG/0.3ML ~~LOC~~ SOLN
30.0000 mg | Freq: Two times a day (BID) | SUBCUTANEOUS | Status: DC
  Administered 2011-08-01 – 2011-08-03 (×4): 30 mg via SUBCUTANEOUS
  Filled 2011-08-01 (×5): qty 0.3

## 2011-08-01 MED ORDER — FLEET ENEMA 7-19 GM/118ML RE ENEM: 1.0000 | ENEMA | Freq: Every day | RECTAL | Status: DC | PRN

## 2011-08-01 MED ORDER — BISACODYL 5 MG PO TBEC: 10.0000 mg | DELAYED_RELEASE_TABLET | Freq: Every day | ORAL | Status: DC | PRN

## 2011-08-01 MED ORDER — ACETAMINOPHEN 650 MG RE SUPP: 650.0000 mg | Freq: Four times a day (QID) | RECTAL | Status: DC | PRN

## 2011-08-01 MED ORDER — ACETAMINOPHEN 325 MG PO TABS
650.0000 mg | ORAL_TABLET | Freq: Four times a day (QID) | ORAL | Status: DC | PRN
  Administered 2011-08-01 – 2011-08-02 (×2): 650 mg via ORAL
  Filled 2011-08-01 (×2): qty 2

## 2011-08-01 MED ORDER — HYDROMORPHONE HCL PF 1 MG/ML IJ SOLN
0.5000 mg | INTRAMUSCULAR | Status: DC | PRN
  Administered 2011-08-01 – 2011-08-02 (×3): 1 mg via INTRAVENOUS
  Filled 2011-08-01 (×3): qty 1

## 2011-08-01 MED ORDER — MENTHOL 3 MG MT LOZG: 1.0000 | LOZENGE | OROMUCOSAL | Status: DC | PRN

## 2011-08-01 MED ORDER — ALUM & MAG HYDROXIDE-SIMETH 200-200-20 MG/5ML PO SUSP: 30.0000 mL | ORAL | Status: DC | PRN

## 2011-08-01 MED ORDER — BISACODYL 10 MG RE SUPP: 10.0000 mg | Freq: Every day | RECTAL | Status: DC | PRN

## 2011-08-01 MED ORDER — METOCLOPRAMIDE HCL 10 MG PO TABS: 5.0000 mg | ORAL_TABLET | Freq: Three times a day (TID) | ORAL | Status: DC | PRN

## 2011-08-01 SURGICAL SUPPLY — 69 items
AUTOTRANSFUSION W/QD PVC DRAIN (AUTOTRANSFUSION) ×2 IMPLANT
BANDAGE ESMARK 6X9 LF (GAUZE/BANDAGES/DRESSINGS) ×1 IMPLANT
BLADE SAGITTAL 25.0X1.19X90 (BLADE) ×2 IMPLANT
BLADE SAW SGTL 11.0X1.19X90.0M (BLADE) IMPLANT
BLADE SAW SGTL 13.0X1.19X90.0M (BLADE) ×2 IMPLANT
BLADE SURG 10 STRL SS (BLADE) ×4 IMPLANT
BNDG CMPR 9X6 STRL LF SNTH (GAUZE/BANDAGES/DRESSINGS) ×1
BNDG CMPR MED 15X6 ELC VLCR LF (GAUZE/BANDAGES/DRESSINGS) ×1
BNDG ELASTIC 6X15 VLCR STRL LF (GAUZE/BANDAGES/DRESSINGS) ×2 IMPLANT
BNDG ESMARK 6X9 LF (GAUZE/BANDAGES/DRESSINGS) ×2
BOWL SMART MIX CTS (DISPOSABLE) ×2 IMPLANT
CEMENT HV SMART SET (Cement) ×4 IMPLANT
CLOTH BEACON ORANGE TIMEOUT ST (SAFETY) ×2 IMPLANT
COVER BACK TABLE 24X17X13 BIG (DRAPES) IMPLANT
COVER PROBE W GEL 5X96 (DRAPES) IMPLANT
COVER SURGICAL LIGHT HANDLE (MISCELLANEOUS) ×2 IMPLANT
CUFF TOURNIQUET SINGLE 34IN LL (TOURNIQUET CUFF) ×2 IMPLANT
CUFF TOURNIQUET SINGLE 44IN (TOURNIQUET CUFF) IMPLANT
DRAPE EXTREMITY T 121X128X90 (DRAPE) ×2 IMPLANT
DRAPE INCISE IOBAN 66X45 STRL (DRAPES) IMPLANT
DRAPE PROXIMA HALF (DRAPES) ×2 IMPLANT
DRAPE U-SHAPE 47X51 STRL (DRAPES) ×2 IMPLANT
DRSG ADAPTIC 3X8 NADH LF (GAUZE/BANDAGES/DRESSINGS) ×2 IMPLANT
DRSG PAD ABDOMINAL 8X10 ST (GAUZE/BANDAGES/DRESSINGS) ×4 IMPLANT
DURAPREP 26ML APPLICATOR (WOUND CARE) ×2 IMPLANT
ELECT CAUTERY BLADE 6.4 (BLADE) ×2 IMPLANT
ELECT REM PT RETURN 9FT ADLT (ELECTROSURGICAL) ×2
ELECTRODE REM PT RTRN 9FT ADLT (ELECTROSURGICAL) ×1 IMPLANT
EVACUATOR 1/8 PVC DRAIN (DRAIN) ×2 IMPLANT
FACESHIELD LNG OPTICON STERILE (SAFETY) ×2 IMPLANT
GLOVE BIO SURGEON STRL SZ7 (GLOVE) ×2 IMPLANT
GLOVE BIOGEL PI IND STRL 7.0 (GLOVE) ×1 IMPLANT
GLOVE BIOGEL PI IND STRL 7.5 (GLOVE) ×1 IMPLANT
GLOVE BIOGEL PI INDICATOR 7.0 (GLOVE) ×1
GLOVE BIOGEL PI INDICATOR 7.5 (GLOVE) ×1
GLOVE SS BIOGEL STRL SZ 7.5 (GLOVE) ×1 IMPLANT
GLOVE SUPERSENSE BIOGEL SZ 7.5 (GLOVE) ×1
GOWN PREVENTION PLUS XLARGE (GOWN DISPOSABLE) ×4 IMPLANT
GOWN STRL NON-REIN LRG LVL3 (GOWN DISPOSABLE) ×4 IMPLANT
HANDPIECE INTERPULSE COAX TIP (DISPOSABLE) ×2
HOOD PEEL AWAY FACE SHEILD DIS (HOOD) ×4 IMPLANT
IMMOBILIZER KNEE 22 UNIV (SOFTGOODS) ×2 IMPLANT
KIT BASIN OR (CUSTOM PROCEDURE TRAY) ×2 IMPLANT
KIT ROOM TURNOVER OR (KITS) ×2 IMPLANT
MANIFOLD NEPTUNE II (INSTRUMENTS) ×2 IMPLANT
NDL 18GX1X1/2 (RX/OR ONLY) (NEEDLE) ×1 IMPLANT
NEEDLE 18GX1X1/2 (RX/OR ONLY) (NEEDLE) IMPLANT
NS IRRIG 1000ML POUR BTL (IV SOLUTION) ×2 IMPLANT
PACK TOTAL JOINT (CUSTOM PROCEDURE TRAY) ×2 IMPLANT
PAD ARMBOARD 7.5X6 YLW CONV (MISCELLANEOUS) ×2 IMPLANT
PAD CAST 4YDX4 CTTN HI CHSV (CAST SUPPLIES) ×1 IMPLANT
PADDING CAST COTTON 4X4 STRL (CAST SUPPLIES) ×2
PADDING CAST COTTON 6X4 STRL (CAST SUPPLIES) ×2 IMPLANT
POSITIONER HEAD PRONE TRACH (MISCELLANEOUS) ×2 IMPLANT
SET HNDPC FAN SPRY TIP SCT (DISPOSABLE) ×1 IMPLANT
SPONGE GAUZE 4X4 12PLY (GAUZE/BANDAGES/DRESSINGS) ×2 IMPLANT
STRIP CLOSURE SKIN 1/2X4 (GAUZE/BANDAGES/DRESSINGS) ×2 IMPLANT
SUCTION FRAZIER TIP 10 FR DISP (SUCTIONS) ×2 IMPLANT
SUT ETHIBOND NAB CT1 #1 30IN (SUTURE) ×4 IMPLANT
SUT MNCRL AB 3-0 PS2 18 (SUTURE) ×2 IMPLANT
SUT VIC AB 0 CT1 27 (SUTURE) ×4
SUT VIC AB 0 CT1 27XBRD ANBCTR (SUTURE) ×2 IMPLANT
SUT VIC AB 2-0 CT1 27 (SUTURE) ×4
SUT VIC AB 2-0 CT1 TAPERPNT 27 (SUTURE) ×2 IMPLANT
SYR 30ML SLIP (SYRINGE) ×1 IMPLANT
TOWEL OR 17X24 6PK STRL BLUE (TOWEL DISPOSABLE) ×2 IMPLANT
TOWEL OR 17X26 10 PK STRL BLUE (TOWEL DISPOSABLE) ×2 IMPLANT
TRAY FOLEY CATH 14FR (SET/KITS/TRAYS/PACK) ×2 IMPLANT
WATER STERILE IRR 1000ML POUR (IV SOLUTION) ×6 IMPLANT

## 2011-08-01 NOTE — Interval H&P Note (Signed)
History and Physical Interval Note:   08/01/2011   7:05 AM   Keith [name]  has presented today for surgery, with the diagnosis of DJD Right side  The various methods of treatment have been discussed with the patient and family. After consideration of risks, benefits and other options for treatment, the patient has consented to  Procedure(s): TOTAL KNEE ARTHROPLASTY as a surgical intervention .  The patients' history has been reviewed, patient examined, no change in status, stable for surgery.  I have reviewed the patients' chart and labs.  Questions were answered to the patient's satisfaction.     Lorn Junes  MD

## 2011-08-01 NOTE — H&P (View-Only) (Signed)
Keith [name] is an 65 y.o. male.   Chief Complaint: right knee end stage djd  HPI: 65 year old white male with history of knee pain for years.  He has tried and failed intraarticular cortisone shot, viscosupplementation shots, NSAIDs and pain meds.  Now ready to have total knee replacement.  Past Medical History  Diagnosis Date  . Anxiety   . Diabetes mellitus type II   . Hypertension   . Benign prostatic hypertrophy   . ED (erectile dysfunction)   . Arthritis   . Sleep apnea     Past Surgical History  Procedure Date  . Colonoscopy   . Tonsillectomy   . Knee arthroscopy 09/07/2010    left knee  . Cataract extraction w/ intraocular lens  implant, bilateral 03/13/2003  . Retinal detachment repair w/ scleral buckle le 10/14/2002    Dr Zadie Rhine  . Knee arthroscopy 05/27/2000    left knee    Family History  Problem Relation Age of Onset  . Arthritis Other   . Cancer Other     breast, skin  . Coronary artery disease Other   . Heart disease Other   . Hypertension Mother   . Heart failure Father    Social History:  reports that he has never smoked. He does not have any smokeless tobacco history on file. He reports that he drinks about .6 ounces of alcohol per week. He reports that he does not use illicit drugs.  Allergies:  Allergies  Allergen Reactions  . Penicillins Anaphylaxis    Medications Prior to Admission  Medication Sig Dispense Refill  . aspirin 81 MG tablet Take 81 mg by mouth daily.        . celecoxib (CELEBREX) 200 MG capsule Take 200 mg by mouth daily.        . citalopram (CELEXA) 40 MG tablet Take 1 tablet (40 mg total) by mouth daily.  90 tablet  2  . fexofenadine (ALLEGRA) 180 MG tablet Take 180 mg by mouth daily.        . fluticasone (FLONASE) 50 MCG/ACT nasal spray Place 2 sprays into the nose daily.        Marland Kitchen lisinopril (PRINIVIL,ZESTRIL) 10 MG tablet Take 10 mg by mouth daily.        . Potassium Gluconate 550 MG TABS Take 2 tablets by mouth daily.        . simvastatin (ZOCOR) 20 MG tablet Take 1 tablet (20 mg total) by mouth at bedtime.  100 tablet  0  . tadalafil (CIALIS) 20 MG tablet Take 20 mg by mouth as directed.        . Tamsulosin HCl (FLOMAX) 0.4 MG CAPS TAKE ONE CAPSULE AT BEDTIME  90 capsule  1   No current facility-administered medications on file as of 07/20/2011.    No results found for this or any previous visit (from the past 48 hour(s)). No results found.  Review of Systems  Constitutional: Negative.   HENT: Negative.  Negative for neck pain.   Eyes: Positive for blurred vision. Negative for double vision, photophobia, pain, discharge and redness.       Cataracts  Respiratory: Negative for cough, hemoptysis, sputum production, shortness of breath and wheezing.   Cardiovascular: Positive for leg swelling. Negative for chest pain, palpitations, orthopnea, claudication and PND.  Gastrointestinal: Negative.   Genitourinary: Positive for frequency. Negative for dysuria, urgency, hematuria and flank pain.       Nocturia  Musculoskeletal: Positive for joint pain.  Negative for myalgias, back pain and falls.       Right knee  Skin: Negative.   Neurological: Negative.   Endo/Heme/Allergies: Negative for environmental allergies and polydipsia. Does not bruise/bleed easily.  Psychiatric/Behavioral: Negative.     Blood pressure 151/83, pulse 63, temperature 98.2 F (36.8 C), temperature source Oral, height 6' (1.829 m), weight 119.296 kg (263 lb), SpO2 95.00%. Physical Exam  Constitutional: He is oriented to person, place, and time. He appears well-developed and well-nourished.  HENT:  Head: Normocephalic and atraumatic.  Nose: Nose normal.  Mouth/Throat: Oropharynx is clear and moist.  Eyes: Conjunctivae and EOM are normal. Pupils are equal, round, and reactive to light.  Neck: Normal range of motion. Neck supple. No JVD present.  Cardiovascular: Normal rate, regular rhythm, normal heart sounds and intact distal pulses.     No murmur heard. Respiratory: Effort normal and breath sounds normal. He has no wheezes. He has no rales. He exhibits no tenderness.  GI: Soft. Bowel sounds are normal. He exhibits no distension. There is no tenderness.  Genitourinary:       Not pertinent to current symptomatology therefore not examined.  Musculoskeletal: He exhibits edema.       Right knee -5 to 120 2 + crepitous 2+ synovitis 5/5 strength ligamentously stable, normal patella tracking  Left knee 0-120 2+ crepitous 1+synovitis well healed arthroscopy incisions ligamentously stable, normal patella tracking  2+ pitting edema bilaterally 2+dorsalis pedis pulses bilaterally Bilaterally equal motor and sensory exam  Neurological: He is alert and oriented to person, place, and time. He has normal reflexes.  Skin: Skin is warm and dry.  Psychiatric: He has a normal mood and affect. His behavior is normal. Judgment and thought content normal.     Assessment Patient Active Problem List  Diagnoses  . DIABETES MELLITUS, TYPE II  . ANXIETY  . ERECTILE DYSFUNCTION, MILD  . MIGRAINE, CHRONIC  . HYPERTENSION  . RHINITIS  . BENIGN PROSTATIC HYPERTROPHY  . Hyperlipidemia  . DJD (degenerative joint disease) of knee   Plan The risks, benefits, and possible complications of the total knee replacement procedure were discussed in detail with the patient.  The patient is without question. We will plan on a total knee replacement by Dr Noemi Chapel on 08/01/2011 at 7:15 am.  Chazlyn Cude A. Kaleen Mask Physician Assistant [facility] [phone]  07/20/2011, 1:24 PM  Linda Hedges 07/20/2011, 1:10 PM

## 2011-08-01 NOTE — OR Nursing (Signed)
Correction: Time Out was performed at 0747.

## 2011-08-01 NOTE — Op Note (Signed)
NAME:  SHERMON, Keith [name] NO.:  [id number]  MEDICAL RECORD NO.:  [id number]  LOCATION:  [facility]                         FACILITY:  [facility]  PHYSICIAN:  Candelaria Pies A. Noemi Chapel, M.D. DATE OF BIRTH:  Oct 17, 1945  DATE OF PROCEDURE:  08/01/2011 DATE OF DISCHARGE:                              OPERATIVE REPORT   PREOPERATIVE DIAGNOSIS:  Right knee degenerative joint disease.  POSTOPERATIVE DIAGNOSIS:  Right knee degenerative joint disease.  PROCEDURE: 1. Right total knee replacement using DePuy cemented total knee system     with #5 cemented femur with #5 cemented tibia with 12.5-mm     polyethylene RP tibial spacer, and 35-mm polyethylene cemented     patella. 2. Tobramycin impregnated cement.  SURGEON:  Audree Camel. Noemi Chapel, MD  ASSISTANT:  Matthew Saras, PA-C  ANESTHESIA:  General.  OPERATIVE TIME:  1 hour and 20 minutes.  COMPLICATIONS:  None.  DESCRIPTION OF PROCEDURE:  Mr. Keith [name] was brought to the operating room on August 01, 2011, after a femoral nerve block was placed in the holding room by Anesthesia.  He was placed on the operative table in supine position.  He received vancomycin 1 g IV preoperatively for prophylaxis. After being placed under general anesthesia, he had a Foley catheter placed under sterile conditions.  His right knee was examined.  Range of motion from -5 to 120 5 degrees, mild varus deformity, knee stable, ligamentous exam with normal patellar tracking.  The right leg was prepped using sterile DuraPrep and draped using sterile technique.  Time- out procedure was called and the correct right knee identified.  The right leg was exsanguinated and a thigh tourniquet elevated at 375 mm. Initially, through a 15-cm longitudinal incision based over the patella, initial exposure was made.  The underlying subcutaneous tissues were incised along with skin incision.  A median arthrotomy was performed revealing an excessive amount of normal-appearing  joint fluid.  The articular surfaces were inspected.  He had grade 4 changes medially, grade 3 changes laterally, and grade 4 changes in the patellofemoral joint.  Osteophytes removed from the femoral condyles and tibial plateau.  The medial and lateral meniscal remnants were removed as well as the anterior cruciate ligament.  Intramedullary drill was then drilled up the femoral canal for placement of distal femoral cutting jig, which was placed in the appropriate manner of rotation and a distal 11 mm cut was made.  The distal femur was incised.  #5 was found to be the appropriate size.  #5 cutting jig was then placed in the appropriate manner of external rotation and then these cuts were made.  The proximal tibia was then exposed.  The tibial spines were removed with an oscillating saw.  Intramedullary drill was drilled down the tibial canal for placement of proximal tibial cutting jig, which was placed in appropriate manner of rotation and a proximal 6-mm cut was made based off the medial or lower side.  Spacer blocks were then placed in flexion and extension.  A 12.5- mm block gave excellent balancing, excellent stability, and excellent correction of his flexion and varus deformities.  The #5 tibial base plate trial was then placed on the cut  tibial surface with an excellent fit and a keel cut was made.  The PCL box cutter was then placed on the distal femur and these cuts were made. At this point, the #5 femoral trial was placed and with a #5 tibial base plate trial and a 624THL mm polyethylene RP tibial spacer, knee was reduced, taken through range of motion from 0-125 degrees with excellent stability and excellent correction of his flexion and varus deformities and normal patellar tracking.  A resurfacing 9-mm cut was made on the patella and 3 locking holes placed for a 35-mm polyethylene patellar trial and again, patellofemoral tracking was evaluated and found to be normal.  At this  point, it was felt that all the trial components were of excellent size, fit, and stability.  They were then removed.  The knee was then jet lavaged and irrigated with 3 L of saline.  The proximal tibia was then exposed and the #5 tibial base plate with tobramycin impregnated cement backing was hammered in position with an excellent fit with excess cement being removed from around the edges. #5 femoral component with cement backing was hammered in position also with an excellent fit with excess cement being removed from around the edges.  The 12.5-mm polyethylene RP tibial spacer was placed on tibial baseplate.  The knee reduced, taken through range of motion from 0-125 degrees, excellent stability, and excellent correction of his flexion and varus deformities.  The 35-mm polyethylene cement backed patella was then placed in its position and held there with a clamp.  After the cement hardened, again patellofemoral tracking was evaluated and found to be normal.  At this point, it was felt that all the components were of excellent size, fit, and stability.  The wound was further irrigated with saline.  Tourniquet was released, and hemostasis obtained with cautery.  The arthrotomy was then closed with #1 Ethibond suture over 2 medium Hemovac drains.  Subcutaneous tissues closed with 0 and 2-0 Vicryl, subcuticular layer closed with 4-0 Monocryl.  Sterile dressings, long-leg splint applied.  The patient then awakened, extubated, and taken to recovery room in stable condition.  Needle and sponge counts correct x2 at the end of the case.  Neurovascular status normal.  Pulses 2+ and symmetric.     Rolanda Campa A. Noemi Chapel, M.D.     RAW/MEDQ  D:  08/01/2011  T:  08/01/2011  Job:  [id number]

## 2011-08-01 NOTE — Anesthesia Procedure Notes (Signed)
Anesthesia Regional Block:  Femoral nerve block  Pre-Anesthetic Checklist: ,, timeout performed, Correct Patient, Correct Site, Correct Laterality, Correct Procedure, Correct Position, site marked, Risks and benefits discussed, pre-op evaluation,  At surgeon's request and post-op pain management  Laterality: Right  Prep: Maximum Sterile Barrier Precautions used and Betadine       Needles:  Injection technique: Single-shot  Needle Type: Other   (Arrow 31mm)    Needle Gauge: 22 and 22 G    Additional Needles:  Procedures: nerve stimulator Femoral nerve block  Nerve Stimulator or Paresthesia:  Response: Patellar respose, 0.4 mA,   Additional Responses:   Narrative:  Start time: 08/01/2011 7:05 AM End time: 08/01/2011 7:14 AM Injection made incrementally with aspirations every 5 mL.  Performed by: Personally   Additional Notes: 2% Lidocaine skin wheel.   Femoral nerve block

## 2011-08-01 NOTE — Anesthesia Preprocedure Evaluation (Addendum)
Anesthesia Evaluation  Patient identified by MRN, date of birth, ID band Patient awake    Reviewed: Allergy & Precautions, NPO status , Patient's Chart, lab work & pertinent test results  History of Anesthesia Complications Negative for: history of anesthetic complications  Airway Mallampati: II TM Distance: >3 FB     Dental   Pulmonary sleep apnea and Continuous Positive Airway Pressure Ventilation ,    Pulmonary exam normal       Cardiovascular hypertension,     Neuro/Psych  Headaches, Negative Neurological ROS  Negative Psych ROS   GI/Hepatic negative GI ROS, Neg liver ROS,   Endo/Other  Negative Endocrine ROSDiabetes mellitus-  Renal/GU negative Renal ROS     Musculoskeletal  (+) Arthritis -, Osteoarthritis,    Abdominal   Peds  Hematology negative hematology ROS (+)   Anesthesia Other Findings   Reproductive/Obstetrics                          Anesthesia Physical Anesthesia Plan  ASA: II  Anesthesia Plan: General   Post-op Pain Management:    Induction: Intravenous  Airway Management Planned: Oral ETT  Additional Equipment:   Intra-op Plan:   Post-operative Plan: Extubation in OR  Informed Consent: I have reviewed the patients History and Physical, chart, labs and discussed the procedure including the risks, benefits and alternatives for the proposed anesthesia with the patient or authorized representative who has indicated his/her understanding and acceptance.   Dental advisory given  Plan Discussed with: Anesthesiologist, CRNA and Surgeon  Anesthesia Plan Comments:        Anesthesia Quick Evaluation

## 2011-08-01 NOTE — Anesthesia Postprocedure Evaluation (Signed)
Anesthesia Post Note  Patient: Keith [name]  Procedure(s) Performed:  TOTAL KNEE ARTHROPLASTY - TOTAL KNEE ARTHROPLASTY  RIGHT SIDE  Anesthesia type: General  Patient location: PACU  Post pain: Pain level controlled  Post assessment: Patient's Cardiovascular Status Stable  Last Vitals:  Filed Vitals:   08/01/11 1103  BP:   Pulse: 91  Temp:   Resp: 16    Post vital signs: Reviewed and stable  Level of consciousness: sedated  Complications: No apparent anesthesia complications

## 2011-08-01 NOTE — Transfer of Care (Signed)
Immediate Anesthesia Transfer of Care Note  Patient: Keith [name]  Procedure(s) Performed:  TOTAL KNEE ARTHROPLASTY - TOTAL KNEE ARTHROPLASTY  RIGHT SIDE  Patient Location: PACU  Anesthesia Type: General and GA combined with regional for post-op pain  Level of Consciousness: awake, oriented, sedated and patient cooperative  Airway & Oxygen Therapy: Patient Spontanous Breathing and Patient connected to nasal cannula oxygen  Post-op Assessment: Report given to PACU RN, Post -op Vital signs reviewed and stable and Patient moving all extremities  Post vital signs: Reviewed and stable  Complications: No apparent anesthesia complications

## 2011-08-01 NOTE — Brief Op Note (Signed)
08/01/2011  9:13 AM  PATIENT:  Barbaraann Boys  65 y.o. male  PRE-OPERATIVE DIAGNOSIS:  degenerative joint disease right knee  POST-OPERATIVE DIAGNOSIS:  degenerative joint disease right knee  PROCEDURE:  Procedure(s): TOTAL KNEE ARTHROPLASTY  SURGEON:  Surgeon(s): Lorn Junes, MD  PHYSICIAN ASSISTANT: Matthew Saras PA-C   ASSISTANTS:KIRSTIN SHEPPERSON PA-C  ANESTHESIA:   general  EBL:  Total I/O In: 1000 [I.V.:1000] Out: -   BLOOD ADMINISTERED:none  DRAINS: AUTOVAC   LOCAL MEDICATIONS USED:  NONE  SPECIMEN:  No Specimen  DISPOSITION OF SPECIMEN:  N/A  COUNTS:  YES  TOURNIQUET:   Total Tourniquet Time Documented: Thigh (Right) - 68 minutes  DICTATION: .Other Dictation: Dictation Number [id number]  PLAN OF CARE: Admit to inpatient   PATIENT DISPOSITION:  PACU - hemodynamically stable.   Delay start of Pharmacological VTE agent (>24hrs) due to surgical blood loss or risk of bleeding:NO

## 2011-08-01 NOTE — Plan of Care (Signed)
Problem: Consults Goal: Diagnosis- Total Joint Replacement Outcome: Completed/Met Date Met:  08/01/11 Primary Total Knee

## 2011-08-02 ENCOUNTER — Encounter (HOSPITAL_COMMUNITY): Payer: Self-pay | Admitting: Orthopedic Surgery

## 2011-08-02 LAB — BASIC METABOLIC PANEL
BUN: 13 mg/dL (ref 6–23)
CO2: 27 mEq/L (ref 19–32)
Calcium: 8.6 mg/dL (ref 8.4–10.5)
Chloride: 105 mEq/L (ref 96–112)
Creatinine, Ser: 0.73 mg/dL (ref 0.50–1.35)
GFR calc Af Amer: >90 mL/min (ref >90)
GFR calc non Af Amer: >90 mL/min (ref >90)
Glucose, Bld: 122 mg/dL — ABNORMAL HIGH (ref 70–99)
Potassium: 4.2 mEq/L (ref 3.5–5.1)
Sodium: 138 mEq/L (ref 135–145)

## 2011-08-02 LAB — CBC
HCT: 37.3 % — ABNORMAL LOW (ref 39.0–52.0)
Hemoglobin: 12.4 g/dL — ABNORMAL LOW (ref 13.0–17.0)
MCH: 30.5 pg (ref 26.0–34.0)
MCHC: 33.2 g/dL (ref 30.0–36.0)
MCV: 91.6 fL (ref 78.0–100.0)
Platelets: 214 10*3/uL (ref 150–400)
RBC: 4.07 MIL/uL — ABNORMAL LOW (ref 4.22–5.81)
RDW: 13.6 % (ref 11.5–15.5)
WBC: 13.7 10*3/uL — ABNORMAL HIGH (ref 4.0–10.5)

## 2011-08-02 MED ORDER — MAGNESIUM HYDROXIDE 400 MG/5ML PO SUSP
30.0000 mL | Freq: Every day | ORAL | Status: AC
  Administered 2011-08-02: 30 mL via ORAL
  Filled 2011-08-02: qty 30

## 2011-08-02 MED ORDER — SODIUM CHLORIDE 0.9 % IV BOLUS (SEPSIS)
500.0000 mL | Freq: Once | INTRAVENOUS | Status: AC
  Administered 2011-08-02: 1000 mL via INTRAVENOUS

## 2011-08-02 MED ORDER — SODIUM CHLORIDE 0.9 % IV BOLUS (SEPSIS)
500.0000 mL | Freq: Once | INTRAVENOUS | Status: AC
  Administered 2011-08-02: 500 mL via INTRAVENOUS

## 2011-08-02 NOTE — Progress Notes (Signed)
Patient ID: Keith [name], male   DOB: June 30, 1946, 65 y.o.   MRN: [id number] PATIENT ID: Keith [name]  MRN: [id number]  DOB/AGE:  1946-05-01 / 65 y.o.  1 Day Post-Op Procedure(s) (LRB): TOTAL KNEE ARTHROPLASTY (Right)    PROGRESS NOTE Subjective: Patient is alert, oriented, no Nausea, no Vomiting, yes passing gas, no Bowel Movement. Taking PO ate breakfast. Denies SOB, Chest or Calf Pain. Using Incentive Spirometer, PAS in place. Ambulates with 1 + assist and walker with wheels, CPM 0-90 Patient reports pain as 7 on 0-10 scale  .    Objective: Vital signs in last 24 hours: Filed Vitals:   08/01/11 1759 08/01/11 1800 08/01/11 2249 08/02/11 0603  BP: 139/80 186/82 134/64 133/60  Pulse: 95 88 92 100  Temp: 98 F (36.7 C) 98.3 F (36.8 C) 97.9 F (36.6 C) 97.7 F (36.5 C)  TempSrc: Oral Oral    Resp: 17 16 18 20   SpO2: 95% 93% 99% 95%      Intake/Output from previous day: I/O last 3 completed shifts: In: 2630 [P.O.:480; I.V.:1650; Other:500] Out: 3200 [Urine:2650; Drains:500; Blood:50]   Intake/Output this shift: Total I/O In: 240 [P.O.:240] Out: -    LABORATORY DATA:  Basename 08/02/11 0510  WBC 13.7*  HGB 12.4*  HCT 37.3*  PLT 214  NA 138  K 4.2  CL 105  CO2 27  BUN 13  CREATININE 0.73  GLUCOSE 122*  INR --  CALCIUM 8.6    Examination: ABD soft Neurovascular intact Sensation intact distally Intact pulses distally Dorsiflexion/Plantar flexion intact Incision: dressing C/D/I no palpitaions or wheezing}  Assessment:   1 Day Post-Op Procedure(s) (LRB): TOTAL KNEE ARTHROPLASTY (Right) ADDITIONAL DIAGNOSIS:  Acute Blood Loss Anemia, Hypertension and anxiety  Plan: PT/OT WBAT, CPM 5/hrs day until ROM 0-90 degrees, then D/C CPM DVT Prophylaxis:  Lovenox\Coumadin bridge target INR 1.5-2.0 DISCHARGE PLAN: Home DISCHARGE NEEDS: HHPT, CPM, Walker and 3-in-1 comode seat     Lundy Cozart J 08/02/2011, 8:43 AM

## 2011-08-02 NOTE — Progress Notes (Signed)
Occupational Therapy Evaluation Patient Details Name: Keith [name] MRN: [id number] DOB: 05-24-1946 Today's Date: 08/02/2011 Time: 9:30-9:49 am Problem List:  Patient Active Problem List  Diagnoses  . ANXIETY  . ERECTILE DYSFUNCTION, MILD  . MIGRAINE, CHRONIC  . HYPERTENSION  . RHINITIS  . BENIGN PROSTATIC HYPERTROPHY  . Hyperlipidemia  . DJD (degenerative joint disease) of knee    Past Medical History:  Past Medical History  Diagnosis Date  . Hypertension   . Benign prostatic hypertrophy     takes Flomax daily  . ED (erectile dysfunction)   . Complication of anesthesia     hard to wake up  . Hyperlipidemia   . Sleep apnea     sleep study done 20+yrs ago  . Arthritis     bil knees  . Anxiety     takes Celexa   Past Surgical History:  Past Surgical History  Procedure Date  . Colonoscopy   . Knee arthroscopy 09/07/2010    left knee  . Cataract extraction w/ intraocular lens  implant, bilateral 03/13/2003  . Retinal detachment repair w/ scleral buckle le 10/14/2002    Dr Zadie Rhine  . Knee arthroscopy 05/27/2000    left knee  . Tonsillectomy     age 67  . Vasectomy 1982  . Total knee arthroplasty 08/01/2011    Procedure: TOTAL KNEE ARTHROPLASTY;  Surgeon: Lorn Junes, MD;  Location: [facility];  Service: Orthopedics;  Laterality: Right;  TOTAL KNEE ARTHROPLASTY  RIGHT SIDE    OT Assessment/Plan/Recommendation OT Assessment Clinical Impression Statement: Pt is currently supervision/min guard transfers & min assist LE ADL's; plans to d/c home with PRN assist from wife, he has all necessary DME & long handled AE at home. Pt was educated in toilet transfers and use of Long handled AE for ADL/selfcare tasks. They reports no further OT needs at this time, will sign off. OT Recommendation/Assessment: Patient does not need any further OT services OT Recommendation Equipment Recommended: None recommended by OT  OT Evaluation Precautions/Restrictions   Precautions Precautions: Fall;Knee Precaution Comments: R KI with amb.  D/C POD #2 Restrictions Weight Bearing Restrictions: Yes RLE Weight Bearing: Weight bearing as tolerated Prior [place] Lives With: Spouse;Daughter Receives Help From: Family Type of Home: House Home Layout: Two level;Able to live on main level with bedroom/bathroom Home Access: Stairs to enter Entrance Stairs-Rails: Left Entrance Stairs-Number of Steps: 3 Bathroom Shower/Tub: Walk-in shower;Other (comment) (built in bench, hand held shower) Bathroom Toilet: Standard (has 3:1 over toilet) Bathroom Accessibility: Yes How Accessible: Accessible via walker Home Adaptive Equipment: Bedside commode/3-in-1;Walker - rolling;Crutches (built in bench in shower, long handled A/E) Prior Function Level of Independence: Independent with basic ADLs;Independent with homemaking with ambulation Able to Take Stairs?: Reciprically Driving: Yes ADL ADL Eating/Feeding: Performed;Independent Where Assessed - Eating/Feeding: Chair Grooming: Simulated;Supervision/safety Where Assessed - Grooming: Standing at sink Upper Body Bathing: Simulated;Modified independent Where Assessed - Upper Body Bathing: Sitting, chair Upper Body Dressing: Simulated;Modified independent Where Assessed - Upper Body Dressing: Sitting, chair Lower Body Dressing: Simulated;Minimal assistance Lower Body Dressing Details (indicate cue type and reason): Don/Doff socks/slippers Where Assessed - Lower Body Dressing: Sitting, chair Toilet Transfer: Performed;Supervision/safety Toilet Transfer Details (indicate cue type and reason): VC's for technique, hand placement Toilet Transfer Method: Ambulating Toilet Transfer Equipment: Raised toilet seat with arms (or 3-in-1 over toilet) Toileting - Clothing Manipulation: Performed;Supervision/safety Where Assessed - Toileting Clothing Manipulation: Standing;Sit to stand from 3-in-1 or  toilet Toileting - Hygiene: Simulated;Modified independent Where Assessed -  Toileting Hygiene: Sit on 3-in-1 or toilet Tub/Shower Transfer: Other (comment) (verbal review w/ pt & wife, have walk in w/ bench) Equipment Used: Rolling walker;Other (comment) (3:1 over toilet, discussed long handled AE use) ADL Comments: Pt is currently supervision/min guard transfers & min assist LE ADL's; plans to d/c home with PRN assist from wife, he has all necessary DME & long handled AE at home. Pt was educated in toilet transfers and use of Long handled AE for ADL/selfcare tasks. They reports no further OT needs at this time, will sign off. Vision/Perception  Vision - History Baseline Vision: Wears glasses all the time Patient Visual Report: No change from baseline Cognition Cognition Arousal/Alertness: Awake/alert Overall Cognitive Status: Appears within functional limits for tasks assessed Orientation Level: Oriented X4 Sensation/Coordination Sensation Light Touch: Appears Intact Coordination Gross Motor Movements are Fluid and Coordinated: Yes Fine Motor Movements are Fluid and Coordinated: Yes Extremity Assessment RUE Assessment RUE Assessment: Within Functional Limits (5/5 strength) LUE Assessment LUE Assessment: Within Functional Limits (5/5 strength) Mobility  Bed Mobility Bed Mobility: Yes Supine to Sit: 4: Min assist;With rails;HOB elevated (Comment degrees) (HOB ~30) Supine to Sit Details (indicate cue type and reason): cues for technique and encouragement Sitting - Scoot to Edge of Bed: 4: Min assist Transfers Sit to Stand: 4: Min assist;From chair/3-in-1;5: Supervision Sit to Stand Details (indicate cue type and reason): supervision-min guard assist from chair and 3:1 w/ vc's for technique and hand placement Stand to Sit: 4: Min assist;To bed;To chair/3-in-1 Stand to Sit Details: min guard assist for controlled transfer/decent Exercises Total Joint Exercises Ankle Circles/Pumps:  AROM;Both;10 reps;Seated Quad Sets: Both;10 reps;Seated Heel Slides: AAROM;Right;10 reps;Seated End of Session OT - End of Session Equipment Utilized During Treatment: Gait belt;Right knee immobilizer;Other (comment) (RW, 3:1 over toilet in bathroom) Activity Tolerance: Patient tolerated treatment well Patient left: in chair;with call bell in reach;with family/visitor present General Behavior During Session: [facility] for tasks performed Cognition: [facility] for tasks performed   Almyra Deforest 08/02/2011, 11:12 AM

## 2011-08-02 NOTE — Progress Notes (Signed)
Physical Therapy Treatment Patient Details Name: LESLEY SPARGO MRN: [id number] DOB: 1945/11/27 Today's Date: 08/02/2011  PT Assessment/Plan  PT - Assessment/Plan Comments on Treatment Session: pt doing excellent and making great progress.  pt left in CPM -3-90degrees.  Anticipate ready for D/C tomorrow.   PT Plan: Discharge plan remains appropriate;Frequency remains appropriate PT Frequency: 7X/week Follow Up Recommendations: Home health PT Equipment Recommended: None recommended by PT PT Goals  Acute Rehab PT Goals PT Goal: Supine/Side to Sit - Progress: Progressing toward goal PT Transfer Goal: Sit to Stand/Stand to Sit - Progress: Progressing toward goal PT Goal: Ambulate - Progress: Progressing toward goal PT Goal: Up/Down Stairs - Progress: Progressing toward goal PT Goal: Perform Home Exercise Program - Progress: Progressing toward goal  PT Treatment Precautions/Restrictions  Precautions Precautions: Fall Precaution Comments: R KI with amb.  D/C POD #2 Restrictions Weight Bearing Restrictions: Yes RLE Weight Bearing: Weight bearing as tolerated Mobility (including Balance) Bed Mobility Bed Mobility: Yes Supine to Sit: 4: Min assist;HOB flat (A with R LE only) Supine to Sit Details (indicate cue type and reason): cues for encouragement Sitting - Scoot to Edge of Bed: 7: Independent Transfers Transfers: Yes Sit to Stand: 4: Min assist;With upper extremity assist;From bed Sit to Stand Details (indicate cue type and reason): demos good use of UEs Stand to Sit: 4: Min assist;With upper extremity assist;To bed Stand to Sit Details: cues to control descent Ambulation/Gait Ambulation/Gait: Yes Ambulation/Gait Assistance: 4: Min assist Ambulation/Gait Assistance Details (indicate cue type and reason): cues for increased step length on L, upright posture Ambulation Distance (Feet): 150 Feet Assistive device: Rolling walker Gait Pattern: Step-to pattern;Decreased step length  - left;Trunk flexed Stairs: No    Exercise  Total Joint Exercises Short Arc Quad: AROM;Right;10 reps;Supine Heel Slides: AROM;10 reps;Seated;Right Long Arc Quad: [place];Right;10 reps;Seated End of Session PT - End of Session Equipment Utilized During Treatment: Gait belt;Right knee immobilizer Activity Tolerance: Patient tolerated treatment well Patient left: in bed;in CPM;with call bell in reach General Behavior During Session: [facility] for tasks performed Cognition: [facility] for tasks performed  Catarina Hartshorn, [place] 08/02/2011, 3:03 PM

## 2011-08-02 NOTE — Progress Notes (Signed)
Physical Therapy Evaluation Patient Details Name: Keith [name] MRN: [id number] DOB: 1946/03/27 Today's Date: 08/02/2011  Problem List:  Patient Active Problem List  Diagnoses  . ANXIETY  . ERECTILE DYSFUNCTION, MILD  . MIGRAINE, CHRONIC  . HYPERTENSION  . RHINITIS  . BENIGN PROSTATIC HYPERTROPHY  . Hyperlipidemia  . DJD (degenerative joint disease) of knee    Past Medical History:  Past Medical History  Diagnosis Date  . Hypertension   . Benign prostatic hypertrophy     takes Flomax daily  . ED (erectile dysfunction)   . Complication of anesthesia     hard to wake up  . Hyperlipidemia   . Sleep apnea     sleep study done 20+yrs ago  . Arthritis     bil knees  . Anxiety     takes Celexa   Past Surgical History:  Past Surgical History  Procedure Date  . Colonoscopy   . Knee arthroscopy 09/07/2010    left knee  . Cataract extraction w/ intraocular lens  implant, bilateral 03/13/2003  . Retinal detachment repair w/ scleral buckle le 10/14/2002    Dr Zadie Rhine  . Knee arthroscopy 05/27/2000    left knee  . Tonsillectomy     age 52  . Vasectomy 1982  . Total knee arthroplasty 08/01/2011    Procedure: TOTAL KNEE ARTHROPLASTY;  Surgeon: Lorn Junes, MD;  Location: [facility];  Service: Orthopedics;  Laterality: Right;  TOTAL KNEE ARTHROPLASTY  RIGHT SIDE    PT Assessment/Plan/Recommendation PT Assessment Clinical Impression Statement: pt with TKA, moving well and very motivated.  Pt would benefit from skilled PT to [place] for D/C to home.   PT Recommendation/Assessment: Patient will need skilled PT in the acute care venue PT Problem List: Decreased strength;Decreased range of motion;Decreased activity tolerance;Decreased balance;Decreased mobility;Decreased knowledge of use of DME;Decreased knowledge of precautions;Pain Barriers to Discharge: None PT Therapy Diagnosis : Abnormality of gait;Acute pain PT Plan PT Frequency: 7X/week PT Treatment/Interventions: DME  instruction;Gait training;Stair training;Functional mobility training;Therapeutic exercise;Balance training;Patient/family education PT Recommendation Follow Up Recommendations: Home health PT Equipment Recommended: None recommended by PT PT Goals  Acute Rehab PT Goals PT Goal Formulation: With patient Time For Goal Achievement: 7 days Pt will go Supine/Side to Sit: Independently;with HOB 0 degrees PT Goal: Supine/Side to Sit - Progress: Progressing toward goal Pt will Transfer Sit to Stand/Stand to Sit: with modified independence;with upper extremity assist PT Transfer Goal: Sit to Stand/Stand to Sit - Progress: Progressing toward goal Pt will Ambulate: >150 feet;with modified independence;with rolling walker PT Goal: Ambulate - Progress: Progressing toward goal Pt will Go Up / Down Stairs: 3-5 stairs;with min assist;with least restrictive assistive device PT Goal: Up/Down Stairs - Progress: Not met Pt will Perform Home Exercise Program: with supervision, verbal cues required/provided PT Goal: Perform Home Exercise Program - Progress: Progressing toward goal  PT Evaluation Precautions/Restrictions  Precautions Precautions: Fall Precaution Comments: R KI with amb.  D/C POD #2 Restrictions Weight Bearing Restrictions: Yes RLE Weight Bearing: Weight bearing as tolerated Prior [place] Lives With: Spouse;Daughter Receives Help From: Family Type of Home: House Home Layout: Two level;Able to live on main level with bedroom/bathroom Home Access: Stairs to enter Entrance Stairs-Rails: Left Entrance Stairs-Number of Steps: 3 Home Adaptive Equipment: Bedside commode/3-in-1;Walker - rolling;Crutches Prior Function Level of Independence: Independent with basic ADLs;Independent with homemaking with ambulation Able to Take Stairs?: Reciprically Driving: Yes Cognition Cognition Orientation Level: Oriented X4 Sensation/Coordination   Extremity Assessment RLE  Assessment RLE Assessment: Exceptions to [facility] RLE AROM (degrees) Right Knee Extension 0-130: 70  Right Knee Flexion 0-140: -10  LLE Assessment LLE Assessment: Within Functional Limits Mobility (including Balance) Bed Mobility Bed Mobility: Yes Supine to Sit: 4: Min assist;With rails;HOB elevated (Comment degrees) (HOB ~30) Supine to Sit Details (indicate cue type and reason): cues for technique and encouragement Sitting - Scoot to Edge of Bed: 4: Min assist Transfers Transfers: Yes Sit to Stand: 4: Min assist;From bed;With upper extremity assist Sit to Stand Details (indicate cue type and reason): cues for UE use and safe technique Stand to Sit: 4: Min assist;With upper extremity assist;To chair/3-in-1 Stand to Sit Details: cues to control descent  Ambulation/Gait Ambulation/Gait: Yes Ambulation/Gait Assistance: 4: Min assist Ambulation/Gait Assistance Details (indicate cue type and reason): cues for sequencing, RW use, upright posture Ambulation Distance (Feet): 80 Feet Assistive device: Rolling walker Gait Pattern: Step-to pattern;Trunk flexed Stairs: No    Exercise  Total Joint Exercises Ankle Circles/Pumps: AROM;Both;10 reps;Seated Quad Sets: Both;10 reps;Seated Heel Slides: AAROM;Right;10 reps;Seated End of Session PT - End of Session Equipment Utilized During Treatment: Gait belt;Right knee immobilizer Activity Tolerance: Patient limited by pain;Patient limited by fatigue Patient left: in chair;with call bell in reach;with family/visitor present Nurse Communication: Mobility status for transfers General Behavior During Session: [facility] for tasks performed Cognition: [facility] for tasks performed  Catarina Hartshorn, [place] 08/02/2011, 10:26 AM

## 2011-08-03 LAB — BASIC METABOLIC PANEL
BUN: 12 mg/dL (ref 6–23)
CO2: 29 mEq/L (ref 19–32)
Calcium: 8.4 mg/dL (ref 8.4–10.5)
Chloride: 101 mEq/L (ref 96–112)
Creatinine, Ser: 0.78 mg/dL (ref 0.50–1.35)
GFR calc Af Amer: >90 mL/min (ref >90)
GFR calc non Af Amer: >90 mL/min (ref >90)
Glucose, Bld: 120 mg/dL — ABNORMAL HIGH (ref 70–99)
Potassium: 4.1 mEq/L (ref 3.5–5.1)
Sodium: 136 mEq/L (ref 135–145)

## 2011-08-03 LAB — CBC
HCT: 36.2 % — ABNORMAL LOW (ref 39.0–52.0)
Hemoglobin: 12.2 g/dL — ABNORMAL LOW (ref 13.0–17.0)
MCH: 30.9 pg (ref 26.0–34.0)
MCHC: 33.7 g/dL (ref 30.0–36.0)
MCV: 91.6 fL (ref 78.0–100.0)
Platelets: 188 10*3/uL (ref 150–400)
RBC: 3.95 MIL/uL — ABNORMAL LOW (ref 4.22–5.81)
RDW: 13.6 % (ref 11.5–15.5)
WBC: 10.5 10*3/uL (ref 4.0–10.5)

## 2011-08-03 MED ORDER — BISACODYL 10 MG RE SUPP
10.0000 mg | Freq: Once | RECTAL | Status: AC
  Administered 2011-08-03: 10 mg via RECTAL
  Filled 2011-08-03: qty 1

## 2011-08-03 MED ORDER — TIZANIDINE HCL 4 MG PO TABS: 4.0000 mg | ORAL_TABLET | Freq: Three times a day (TID) | ORAL | Status: AC | PRN

## 2011-08-03 MED ORDER — OXYCODONE-ACETAMINOPHEN 10-325 MG PO TABS: ORAL_TABLET | ORAL | Status: DC

## 2011-08-03 MED ORDER — ENOXAPARIN SODIUM 30 MG/0.3ML ~~LOC~~ SOLN: 30.0000 mg | Freq: Two times a day (BID) | SUBCUTANEOUS | Status: DC

## 2011-08-03 MED ORDER — DSS 100 MG PO CAPS: 100.0000 mg | ORAL_CAPSULE | Freq: Two times a day (BID) | ORAL | Status: AC

## 2011-08-03 NOTE — Plan of Care (Signed)
Problem: Discharge Progression Outcomes Goal: Anticoagulant follow-up in place Outcome: Completed/Met Date Met:  08/03/11 Wife instructed in giving Lovenox and returned demonstration

## 2011-08-03 NOTE — Progress Notes (Signed)
Physical Therapy Treatment Patient Details Name: Keith [name] MRN: [id number] DOB: [birth year] Today's Date: 08/03/2011  PT Assessment/Plan  PT - Assessment/Plan Comments on Treatment Session: Pt. progressing well.  PT Plan: Discharge plan remains appropriate PT Frequency: 7X/week Follow Up Recommendations: Home health PT Equipment Recommended: None recommended by PT PT Goals  Acute Rehab PT Goals PT Transfer Goal: Sit to Stand/Stand to Sit - Progress: Progressing toward goal PT Goal: Ambulate - Progress: Progressing toward goal PT Goal: Up/Down Stairs - Progress: Progressing toward goal PT Goal: Perform Home Exercise Program - Progress: Progressing toward goal  PT Treatment Precautions/Restrictions  Precautions Precautions: Knee Precaution Comments: R KI with amb.  D/C POD #2 Restrictions Weight Bearing Restrictions: Yes RLE Weight Bearing: Weight bearing as tolerated Mobility (including Balance) Bed Mobility Bed Mobility: No Transfers Transfers: Yes Sit to Stand: 5: Supervision;From chair/3-in-1 Sit to Stand Details (indicate cue type and reason): Cues for safe hand placement.  Stand to Sit: 5: Supervision;To chair/3-in-1 Stand to Sit Details: Cues to slide out RLE Ambulation/Gait Ambulation/Gait: Yes Ambulation/Gait Assistance: 5: Supervision Ambulation/Gait Assistance Details (indicate cue type and reason): Pt. starting to ambulate with step through gait pattern.  Ambulation Distance (Feet): 200 Feet Assistive device: Rolling walker Gait Pattern: Step-through pattern;Decreased step length - right;Decreased step length - left Stairs: Yes Stairs Assistance: 4: Min assist Stairs Assistance Details (indicate cue type and reason): A with RW. Cues for sequency and technique Stair Management Technique: No rails;Backwards;With walker Number of Stairs: 2  Wheelchair Mobility Wheelchair Mobility: No    Exercise  Total Joint Exercises Quad Sets:  AROM;Strengthening;Right;10 reps;Seated Heel Slides: AROM;Strengthening;Right;10 reps;Seated Hip ABduction/ADduction: AROM;Strengthening;Right;10 reps;Seated Straight Leg Raises: AAROM;Strengthening;Right;10 reps;Seated End of Session PT - End of Session Equipment Utilized During Treatment: Gait belt Activity Tolerance: Patient tolerated treatment well Patient left: in chair;with call bell in reach Nurse Communication: Mobility status for transfers;Mobility status for ambulation General Behavior During Session:  [facility] for tasks performed Cognition: [facility] for tasks performed  Socorro Kanitz, Tonia Brooms 08/03/2011, 11:57 AM 08/03/2011 Jacqualyn Posey PTA [phone] pager [phone] office

## 2011-08-03 NOTE — Progress Notes (Signed)
Patient ID: Keith [name], male   DOB: [birth year], 65 y.o.   MRN: [id number] PATIENT ID: Keith [name]  MRN: [id number]  DOB/AGE:  09-06-1946 / 65 y.o.  2 Days Post-Op Procedure(s) (LRB): TOTAL KNEE ARTHROPLASTY (Right)    PROGRESS NOTE Subjective: Patient is alert, oriented, no Nausea, no Vomiting, yes passing gas, no Bowel Movement. Taking PO eating well. Denies SOB, Chest or Calf Pain. Using Incentive Spirometer, PAS in place. Ambulate walked to the nurses station, CPM 0-90 Patient reports pain as 3 on 0-10 scale  .    Objective: Vital signs in last 24 hours: Filed Vitals:   08/02/11 1001 08/02/11 1249 08/02/11 2211 08/03/11 0621  BP: 116/66 115/72 131/79 153/72  Pulse: 79 92 81 77  Temp: 97.4 F (36.3 C) 98.3 F (36.8 C) 98 F (36.7 C) 98.3 F (36.8 C)  TempSrc: Oral Oral    Resp: 18 20 20 20   SpO2: 90% 96% 95% 95%      Intake/Output from previous day: I/O last 3 completed shifts: In: 840 [P.O.:840] Out: 1600 [Urine:1250; Drains:350]   Intake/Output this shift:     LABORATORY DATA:  Basename 08/03/11 0500 08/02/11 0510  WBC 10.5 13.7*  HGB 12.2* 12.4*  HCT 36.2* 37.3*  PLT 188 214  NA 136 138  K 4.1 4.2  CL 101 105  CO2 29 27  BUN 12 13  CREATININE 0.78 0.73  GLUCOSE 120* 122*  INR -- --  CALCIUM 8.4 --    Examination: ABD soft Neurovascular intact Sensation intact distally Intact pulses distally Dorsiflexion/Plantar flexion intact Incision: scant drainage} Slight swollen and red left knee No wheezing  No palpitations  Assessment:   2 Days Post-Op Procedure(s) (LRB): TOTAL KNEE ARTHROPLASTY (Right) ADDITIONAL DIAGNOSIS:  Acute Blood Loss Anemia, Hypertension and Sleep Apnea  Plan: PT/OT WBAT, CPM 5/hrs day until ROM 0-90 degrees, then D/C CPM DVT Prophylaxis:  Lovenox\Coumadin bridge target INR 1.5-2.0 DISCHARGE PLAN: Home DISCHARGE NEEDS: HHPT, CPM, Walker and 3-in-1 comode seat D/c to home today  Sofia Jaquith A. Kaleen Mask Physician  Assistant [facility] [phone]  08/03/2011, 7:13 AM     Linda Hedges 08/03/2011, 7:11 AM

## 2011-08-03 NOTE — Discharge Summary (Signed)
Physician Discharge Summary  Patient ID: Keith [name] MRN: [id number] DOB/AGE: 1946-05-15 65 y.o.  Admit date: 08/01/2011 Discharge date: 08/03/2011  Admission Diagnoses: Patient Active Problem List  Diagnoses  . ANXIETY  . ERECTILE DYSFUNCTION, MILD  . MIGRAINE, CHRONIC  . HYPERTENSION  . RHINITIS  . BENIGN PROSTATIC HYPERTROPHY  . Hyperlipidemia  . DJD (degenerative joint disease) of knee    Discharge Diagnoses:  Principal Problem:  *DJD (degenerative joint disease) of knee Active Problems:  ANXIETY  HYPERTENSION   Discharged Condition: good  Hospital Course:  Hospital course Keith [name] is a 65 year old white male who underwent a right total knee replacement on Monday, November 19. Preoperatively he had a femoral nerve block by anesthesia postoperatively he had an [employer]ovac transfusion. Postoperatively he was placed in a CPM 0-90 for 6 hours. He progressed well on with a hemoglobin above 12 postop day 1 he was metabolically stable. He ambulated to the nurses station and back on postop day 1. He tolerated the CPM 0-90 for 6 hours on postop day 1 he was alert and oriented pain was controlled with Percocet and I oxycodone. Postop day 2 patient's alert and oriented he is metabolically stable with once again a hemoglobin above 12 he is being discharged to time and stable condition weightbearing as tolerated on a regular diet after he has physical therapy to ambulate stairs. His surgical wound is well approximated and healing he has a slight bit or redness a moderate amount of swelling and is neurovascularly intact with a 2+ dorsalis pedis pulse.  Consults: none  Significant Diagnostic Studies:  Results for orders placed during the hospital encounter of 08/01/11 (from the past 24 hour(s))  CBC     Status: Abnormal   Collection Time   08/03/11  5:00 AM      Component Value Range   WBC 10.5  4.0 - 10.5 (K/uL)   RBC 3.95 (*) 4.22 - 5.81 (MIL/uL)   Hemoglobin 12.2 (*) 13.0 - 17.0  (g/dL)   HCT 36.2 (*) 39.0 - 52.0 (%)   MCV 91.6  78.0 - 100.0 (fL)   MCH 30.9  26.0 - 34.0 (pg)   MCHC 33.7  30.0 - 36.0 (g/dL)   RDW 13.6  11.5 - 15.5 (%)   Platelets 188  150 - 400 (K/uL)  BASIC METABOLIC PANEL     Status: Abnormal   Collection Time   08/03/11  5:00 AM      Component Value Range   Sodium 136  135 - 145 (mEq/L)   Potassium 4.1  3.5 - 5.1 (mEq/L)   Chloride 101  96 - 112 (mEq/L)   CO2 29  19 - 32 (mEq/L)   Glucose, Bld 120 (*) 70 - 99 (mg/dL)   BUN 12  6 - 23 (mg/dL)   Creatinine, Ser 0.78  0.50 - 1.35 (mg/dL)   Calcium 8.4  8.4 - 10.5 (mg/dL)   GFR calc non Af Amer >90  >90 (mL/min)   GFR calc Af Amer >90  >90 (mL/min)   Treatments: IV hydration, antibiotics: vancomycin, anticoagulation: LMW heparin, therapies: PT and OT and surgery: right total knee  Discharge Exam: Blood pressure 153/72, pulse 77, temperature 98.3 F (36.8 C), temperature source Oral, resp. rate 20, SpO2 95.00%. General appearance: alert and cooperative Resp: clear to auscultation bilaterally Cardio: regular rate and rhythm, S1, S2 normal, no murmur, click, rub or gallop Extremities: right knee slightly red and swollen.  wound well approximated.  minimal drainage ROM -5-90  passively Pulses: 2+ and symmetric Skin: Skin color, texture, turgor normal. No rashes or lesions Neurologic: Grossly normal Incision/Wound:  Disposition:   Discharge Orders    Future Orders Please Complete By Expires   Diet - low sodium heart healthy      Call MD / Call 911      Comments:   If you experience chest pain or shortness of breath, CALL 911 and be transported to the hospital emergency room.  If you develope a fever above 101 F, pus (white drainage) or increased drainage or redness at the wound, or calf pain, call your surgeon's office.   Constipation Prevention      Comments:   Drink plenty of fluids.  Prune juice may be helpful.  You may use a stool softener, such as Colace (over the counter) 100 mg  twice a day.  Use MiraLax (over the counter) for constipation as needed.   Increase activity slowly as tolerated      Weight Bearing as taught in Physical Therapy      Comments:   Use a walker or crutches as instructed.   Discharge instructions      Comments:   See total knee instructions below   Driving restrictions      Comments:   No driving for 4 weeks   Lifting restrictions      Comments:   No lifting for4 weeks   CPM      Comments:   Continuous passive motion machine (CPM):      Use the CPM from 0 to 90 for6 hours per day.        You may break it up into 2 or 3 sessions per day.      Use CPM for2 weeks or until you are told to stop.   TED hose      Comments:   Use stockings (TED hose) for4 weeks on both leg(s).  You may remove them at night for sleeping.   Change dressing      Comments:   change the dressing daily with sterile 4 x 4 inch gauze dressing and apply TED hose.  You may clean the incision with alcohol prior to redressing.   Wash wound daily with soap and water    Do not put a pillow under the knee. Place it under the heel.      Scheduling Instructions:   Place yellow foam block, yellow side up under heel at all times except when in CPM or when walking.  DO NOT modify, tear, cut, or change in any way the yellow foam block.     Current Discharge Medication List    START taking these medications   Details  docusate sodium 100 MG CAPS Take 100 mg by mouth 2 (two) times daily. Qty: 60 capsule, Refills: 0   Associated Diagnoses: Constipation    enoxaparin (LOVENOX) 30 MG/0.3ML SOLN Inject 0.3 mLs (30 mg total) into the skin every 12 (twelve) hours. Qty: 24 Syringe, Refills: 0   Associated Diagnoses: DJD (degenerative joint disease) of knee    oxyCODONE-acetaminophen (PERCOCET) 10-325 MG per tablet 1-2 tab every 4-6 hrs as needed for pain Qty: 100 tablet, Refills: 0   Associated Diagnoses: DJD (degenerative joint disease) of knee    tiZANidine (ZANAFLEX) 4 MG  tablet Take 1 tablet (4 mg total) by mouth every 8 (eight) hours as needed. Qty: 45 tablet, Refills: 0   Associated Diagnoses: DJD (degenerative joint disease) of knee  CONTINUE these medications which have NOT CHANGED   Details  celecoxib (CELEBREX) 200 MG capsule Take 200 mg by mouth daily.      citalopram (CELEXA) 40 MG tablet Take 1 tablet (40 mg total) by mouth daily. Qty: 90 tablet, Refills: 2    Tamsulosin HCl (FLOMAX) 0.4 MG CAPS TAKE ONE CAPSULE AT BEDTIME Qty: 90 capsule, Refills: 1    tadalafil (CIALIS) 20 MG tablet Take 20 mg by mouth daily as needed. For erectil dysfunction      STOP taking these medications     Potassium Gluconate 550 MG TABS          Signed: Elivia Robotham J 08/03/2011, 7:26 AM

## 2011-11-08 ENCOUNTER — Telehealth: Payer: Self-pay | Admitting: Family Medicine

## 2011-11-08 MED ORDER — TAMSULOSIN HCL 0.4 MG PO CAPS: 0.4000 mg | ORAL_CAPSULE | Freq: Every day | ORAL | Status: DC

## 2011-11-08 NOTE — Telephone Encounter (Signed)
Tamsulosin HCl (FLOMAX) 0.4 MG CAPS- pt needs refill called in

## 2012-05-18 ENCOUNTER — Other Ambulatory Visit: Payer: Self-pay | Admitting: Family Medicine

## 2012-05-31 ENCOUNTER — Telehealth: Payer: Self-pay | Admitting: Family Medicine

## 2012-05-31 NOTE — Telephone Encounter (Signed)
Drink lots of water OTC Zyrtec plain one daily  email  in some Flonase nasal spray one shot each nostril twice daily,,,,,,,,, dispense one unit uses directed refills x2

## 2012-05-31 NOTE — Telephone Encounter (Signed)
Patient called stating that he thinks he may have a sinus infection and is coughing up green mucus. Please assist.

## 2012-05-31 NOTE — Telephone Encounter (Signed)
Patient went to urgent care

## 2012-06-01 ENCOUNTER — Ambulatory Visit: Payer: BC Managed Care – PPO | Admitting: Internal Medicine

## 2012-06-04 ENCOUNTER — Ambulatory Visit: Payer: BC Managed Care – PPO | Admitting: Family Medicine

## 2012-06-07 ENCOUNTER — Encounter: Payer: Self-pay | Admitting: Family Medicine

## 2012-06-07 ENCOUNTER — Ambulatory Visit (INDEPENDENT_AMBULATORY_CARE_PROVIDER_SITE_OTHER): Payer: 59 | Admitting: Family Medicine

## 2012-06-07 VITALS — BP 140/90 | Temp 97.6°F | Wt 274.0 lb

## 2012-06-07 DIAGNOSIS — B349 Viral infection, unspecified: Secondary | ICD-10-CM

## 2012-06-07 DIAGNOSIS — H8110 Benign paroxysmal vertigo, unspecified ear: Secondary | ICD-10-CM

## 2012-06-07 DIAGNOSIS — B9789 Other viral agents as the cause of diseases classified elsewhere: Secondary | ICD-10-CM

## 2012-06-07 DIAGNOSIS — H6590 Unspecified nonsuppurative otitis media, unspecified ear: Secondary | ICD-10-CM

## 2012-06-07 MED ORDER — FLUTICASONE PROPIONATE 50 MCG/ACT NA SUSP: NASAL | Status: DC

## 2012-06-07 MED ORDER — HYDROCODONE-HOMATROPINE 5-1.5 MG/5ML PO SYRP: 5.0000 mL | ORAL_SOLUTION | Freq: Three times a day (TID) | ORAL | Status: DC | PRN

## 2012-06-07 NOTE — Progress Notes (Signed)
  Subjective:    Patient ID: Keith [name], male    DOB: 05-12-1946, 66 y.o.   MRN: [id number]  HPI Keith [name] is a 66 year old married male nonsmoker who comes in today for evaluation of vertigo  10 days ago he developed a cough head congestion sore throat no fevers. A week ago he went to an urgent care center and was given some cough syrup and antihistamine. His cough is better and he seemed to be okay until this morning when he went to turn over in bed and felt the sudden onset of vertigo. It's now stopped. No hearing loss. Cough is improving.  No history of asthma or pneumonia   Review of Systems General pulmonary and neurologic review of systems otherwise negative    Objective:   Physical Exam  Well-developed well-nourished no acute distress HEENT was negative except for left serous otitis media neck was supple lungs are clear      Assessment & Plan:  Viral syndrome resolving  Serous otitis media left ear  Vertigo  Plan nasal decongestants cough syrup for residual cough return when necessary

## 2012-06-07 NOTE — Patient Instructions (Signed)
At bedtime take one shot of Afrin nasal spray up each nostril,,,,,,,,,,,, the Afrin has a 5 night limit,,,,,,,,,, followed by the steroid nasal spray. Hydromet 1/2-1 teaspoon when necessary for cough return when necessary  In the future if you have any concerns ,,,,,,,,,,,,, voice male with Apolonio Schneiders my nurse

## 2012-08-13 ENCOUNTER — Encounter: Payer: Self-pay | Admitting: Family Medicine

## 2012-08-13 ENCOUNTER — Ambulatory Visit (INDEPENDENT_AMBULATORY_CARE_PROVIDER_SITE_OTHER): Payer: BC Managed Care – PPO | Admitting: Family Medicine

## 2012-08-13 VITALS — BP 160/90 | Temp 98.0°F | Wt 278.0 lb

## 2012-08-13 DIAGNOSIS — R7309 Other abnormal glucose: Secondary | ICD-10-CM

## 2012-08-13 DIAGNOSIS — R350 Frequency of micturition: Secondary | ICD-10-CM

## 2012-08-13 DIAGNOSIS — R739 Hyperglycemia, unspecified: Secondary | ICD-10-CM

## 2012-08-13 LAB — PSA: PSA: 1.81 ng/mL (ref 0.10–4.00)

## 2012-08-13 LAB — HEMOGLOBIN A1C: Hgb A1c MFr Bld: 9.9 % — ABNORMAL HIGH (ref 4.6–6.5)

## 2012-08-13 LAB — POCT URINALYSIS DIPSTICK
Bilirubin, UA: NEGATIVE
Blood, UA: NEGATIVE
Glucose, UA: 1000
Ketones, UA: NEGATIVE
Leukocytes, UA: NEGATIVE
Nitrite, UA: NEGATIVE
Protein, UA: NEGATIVE
Spec Grav, UA: >=1.03
Urobilinogen, UA: 0.2
pH, UA: 6

## 2012-08-13 LAB — GLUCOSE, POCT (MANUAL RESULT ENTRY): POC Glucose: 177 mg/dl — AB (ref 70–99)

## 2012-08-13 LAB — BASIC METABOLIC PANEL
BUN: 13 mg/dL (ref 6–23)
CO2: 26 mEq/L (ref 19–32)
Calcium: 9.2 mg/dL (ref 8.4–10.5)
Chloride: 99 mEq/L (ref 96–112)
Creatinine, Ser: 0.8 mg/dL (ref 0.4–1.5)
GFR: 99.97 mL/min (ref >60.00)
Glucose, Bld: 191 mg/dL — ABNORMAL HIGH (ref 70–99)
Potassium: 3.8 mEq/L (ref 3.5–5.1)
Sodium: 135 mEq/L (ref 135–145)

## 2012-08-13 LAB — MICROALBUMIN / CREATININE URINE RATIO
Creatinine,U: 156.5 mg/dL
Microalb Creat Ratio: 3.8 mg/g (ref 0.0–30.0)
Microalb, Ur: 6 mg/dL — ABNORMAL HIGH (ref 0.0–1.9)

## 2012-08-13 NOTE — Progress Notes (Signed)
  Subjective:    Patient ID: Keith [name], male    DOB: 24-Oct-1945, 66 y.o.   MRN: [id number]  HPI Keith [name] is a 65 year old male married nonsmoker who comes in today for evaluation of urinary frequency and urgency for one year  He states for the past year he said frequency and urgency of urination. No fever chills nausea vomiting diarrhea or weight loss. Also in the past he's had slightly elevated blood sugars in the 120 range  Blood sugar today random 177,,,,,,,,,,,,, urinalysis normal   Review of Systems General and metabolic review of systems otherwise negative    Objective:   Physical Exam  Well-developed well-nourished male no acute distress weight steady at 278 pounds      Assessment & Plan:  Elevated blood sugar question diabetes  Urinary frequency question caffeine question blood sugar elevation  Plan sugar-free caffeine free diet fasting blood sugar daily check labs followup in one week

## 2012-08-13 NOTE — Patient Instructions (Signed)
Caffeine and sugar free diet  Drink lots of water  Walk 30 minutes daily  Check a fasting blood sugar daily in the morning  Return in one week for followup with a record of all your blood sugar readings

## 2012-08-14 ENCOUNTER — Ambulatory Visit (INDEPENDENT_AMBULATORY_CARE_PROVIDER_SITE_OTHER): Payer: BC Managed Care – PPO | Admitting: Family Medicine

## 2012-08-14 ENCOUNTER — Encounter: Payer: Self-pay | Admitting: Family Medicine

## 2012-08-14 DIAGNOSIS — IMO0001 Reserved for inherently not codable concepts without codable children: Secondary | ICD-10-CM

## 2012-08-14 DIAGNOSIS — IMO0002 Reserved for concepts with insufficient information to code with codable children: Secondary | ICD-10-CM

## 2012-08-14 DIAGNOSIS — E1165 Type 2 diabetes mellitus with hyperglycemia: Secondary | ICD-10-CM

## 2012-08-14 MED ORDER — METFORMIN HCL 500 MG PO TABS: ORAL_TABLET | ORAL | Status: DC

## 2012-08-14 NOTE — Patient Instructions (Signed)
Begin metformin 500 mg.................. one half tablet before breakfast and one half tablet prior to you male  Fasting blood sugar daily in the morning  Return in one week for followup  Continue your diet and exercise program

## 2012-08-14 NOTE — Progress Notes (Signed)
  Subjective:    Patient ID: Keith [name], male    DOB: Jul 25, 1946, 66 y.o.   MRN: [id number]  HPI  Keith [name] is a 66 year old married male nonsmoker who comes in today for followup of elevated blood sugar  In the past she's had elevated blood sugars but he is always been able to maintain them and get them back to normal with diet and exercise. He came in the other day fasting blood sugar 170 7 repeat blood sugar 199 and his A1c was 9.9. Previous A1c a year ago was 6.5   Review of Systems    general and metabolic review of systems otherwise negative Objective:   Physical Exam Well-developed well-nourished male in no acute distress       Assessment & Plan:  New onset diabetes type 2 plan reviewed diet exercise blood sugar monitoring begin metformin 250 twice a day followup in one week

## 2012-08-16 ENCOUNTER — Telehealth: Payer: Self-pay | Admitting: *Deleted

## 2012-08-16 NOTE — Telephone Encounter (Signed)
Patient e-mailed with a request to increase his metformin from 250 mg twice daily to 500 mg twice daily.  His daily readings have been 148, 191, 208.  Per Dr Shawna Orleans - okay to increase medication.  Patient is aware and reminded to exercise daily.  Patient also has a follow up visit next week.

## 2012-08-17 ENCOUNTER — Encounter: Payer: Self-pay | Admitting: *Deleted

## 2012-08-17 NOTE — Telephone Encounter (Signed)
Patient request Activation code for My Chart.  I called patient and gave him the code.

## 2012-08-20 ENCOUNTER — Ambulatory Visit: Payer: BC Managed Care – PPO | Admitting: Family Medicine

## 2012-08-21 ENCOUNTER — Ambulatory Visit (INDEPENDENT_AMBULATORY_CARE_PROVIDER_SITE_OTHER): Payer: BC Managed Care – PPO | Admitting: Family Medicine

## 2012-08-21 ENCOUNTER — Encounter: Payer: Self-pay | Admitting: Family Medicine

## 2012-08-21 VITALS — BP 140/80 | Temp 97.9°F | Wt 274.0 lb

## 2012-08-21 DIAGNOSIS — IMO0001 Reserved for inherently not codable concepts without codable children: Secondary | ICD-10-CM

## 2012-08-21 DIAGNOSIS — E1165 Type 2 diabetes mellitus with hyperglycemia: Secondary | ICD-10-CM

## 2012-08-21 DIAGNOSIS — IMO0002 Reserved for concepts with insufficient information to code with codable children: Secondary | ICD-10-CM

## 2012-08-21 MED ORDER — METFORMIN HCL 1000 MG PO TABS: ORAL_TABLET | ORAL | Status: DC

## 2012-08-21 NOTE — Progress Notes (Signed)
  Subjective:    Patient ID: Keith [name], male    DOB: September 15, 1945, 66 y.o.   MRN: [id number]  HPI Keith [name] is a 66 year old married male nonsmoker who comes in today for followup of diabetes new-onset  We've increased his oral medications to 500 mg twice a day his blood sugars are still in the 140-160 range. No hypoglycemia   Review of Systems    review of systems otherwise negative Objective:   Physical Exam  Well-developed well-nourished male in no acute distress weight 224 pounds BP 140/80  Review of blood sugars shows the average about 160      Assessment & Plan:  Diabetes type 2 not at goal increase metformin take 1000 mg before breakfast and 500 before evening meal. In 2 weeks if blood sugar not normal increase to 1000 mg twice a day

## 2012-08-21 NOTE — Patient Instructions (Signed)
Increase your metformin,,,,,,,,,,,, take 1000 mg prior to breakfast and 500 mg prior to evening male  Check a fasting blood sugar daily in the morning  Goal 100  If in 2 weeks your blood sugars are still elevated increase the metformin to 1000 mg twice daily  Return in 4 weeks for followup

## 2012-08-28 ENCOUNTER — Telehealth: Payer: Self-pay | Admitting: Family Medicine

## 2012-08-28 NOTE — Telephone Encounter (Signed)
Apolonio Schneiders,  This message from Mr. Biesinger came through email via the [employer] website:  The [email or web address] Activation Code that I wrote down does not work. What I wrote down was [id number]. Does the activate code expire? Did I write it down incorrectly? Can I get another code? I would really like to utilize it.  Can you send me a REPLY email with a new activation code?  I also need a refill called into [facility] for Citalopram (generic for CELEXA). I am out and need to get back onto this medication.  I also need a refile on Tamsulosin [facility]CI (generic for [facility]). I have a few pills left but the prescription has expired.  Apolonio Schneiders - I took care of the My Chart portion - can you take care of the refills? Thanks.

## 2012-08-29 MED ORDER — TAMSULOSIN HCL 0.4 MG PO CAPS: 0.4000 mg | ORAL_CAPSULE | Freq: Every day | ORAL | Status: DC

## 2012-08-29 MED ORDER — CITALOPRAM HYDROBROMIDE 40 MG PO TABS: 40.0000 mg | ORAL_TABLET | Freq: Every day | ORAL | Status: DC

## 2012-08-29 NOTE — Telephone Encounter (Signed)
Rx sent to pharmacy   

## 2012-08-30 ENCOUNTER — Encounter: Payer: Self-pay | Admitting: Family Medicine

## 2012-08-30 ENCOUNTER — Other Ambulatory Visit: Payer: Self-pay | Admitting: Family Medicine

## 2012-08-31 MED ORDER — GLUCOSE BLOOD VI STRP: ORAL_STRIP | Status: DC

## 2012-09-06 ENCOUNTER — Encounter: Payer: Self-pay | Admitting: Family Medicine

## 2012-09-07 ENCOUNTER — Other Ambulatory Visit: Payer: Self-pay | Admitting: Family Medicine

## 2012-09-07 ENCOUNTER — Encounter: Payer: Self-pay | Admitting: Family Medicine

## 2012-09-07 DIAGNOSIS — E1165 Type 2 diabetes mellitus with hyperglycemia: Secondary | ICD-10-CM

## 2012-09-07 DIAGNOSIS — IMO0002 Reserved for concepts with insufficient information to code with codable children: Secondary | ICD-10-CM

## 2012-09-07 MED ORDER — METFORMIN HCL 1000 MG PO TABS: 1000.0000 mg | ORAL_TABLET | Freq: Two times a day (BID) | ORAL | Status: DC

## 2012-09-07 MED ORDER — GLUCOSE BLOOD VI STRP: ORAL_STRIP | Status: DC

## 2012-09-08 ENCOUNTER — Encounter: Payer: Self-pay | Admitting: Family Medicine

## 2012-09-10 ENCOUNTER — Encounter: Payer: Self-pay | Admitting: Family Medicine

## 2012-09-10 MED ORDER — GLUCOSE BLOOD VI STRP: 1.0000 | ORAL_STRIP | Freq: Every day | Status: DC

## 2012-09-10 NOTE — Telephone Encounter (Signed)
Rx  Called into pharmacy and patient is aware

## 2012-09-13 ENCOUNTER — Encounter: Payer: Self-pay | Admitting: Family Medicine

## 2012-10-01 ENCOUNTER — Encounter: Payer: Self-pay | Admitting: Family Medicine

## 2012-10-01 ENCOUNTER — Ambulatory Visit (INDEPENDENT_AMBULATORY_CARE_PROVIDER_SITE_OTHER): Payer: BC Managed Care – PPO | Admitting: Family Medicine

## 2012-10-01 VITALS — BP 120/80 | Temp 97.6°F | Wt 267.0 lb

## 2012-10-01 DIAGNOSIS — R7309 Other abnormal glucose: Secondary | ICD-10-CM

## 2012-10-01 DIAGNOSIS — R739 Hyperglycemia, unspecified: Secondary | ICD-10-CM

## 2012-10-01 NOTE — Patient Instructions (Signed)
Continue your current medications  Remember to walk daily  Followup in 2 months  Nonfasting labs one week prior

## 2012-10-01 NOTE — Progress Notes (Signed)
  Subjective:    Patient ID: Keith [name], male    DOB: 1946-04-17, 67 y.o.   MRN: [id number]  HPI Keith [name] is a 67 year old male nonsmoker who comes in today for followup of diabetes  He was found to have diabetes in December and started on metformin. We've increased his dose every 2 weeks. Current dose is 1000 mg twice daily and blood sugar is in the 120 range. No hypoglycemia    Review of Systems Gen. metabolic review of systems otherwise negative    Objective:   Physical Exam Well-developed well-nourished male no acute distress      Assessment & Plan:  Diabetes type 2 at at goal continue current therapy followup in 2 months

## 2012-10-02 ENCOUNTER — Encounter: Payer: Self-pay | Admitting: Family Medicine

## 2012-10-27 ENCOUNTER — Other Ambulatory Visit: Payer: Self-pay

## 2012-11-22 ENCOUNTER — Encounter: Payer: Self-pay | Admitting: Family Medicine

## 2012-11-22 ENCOUNTER — Other Ambulatory Visit: Payer: BC Managed Care – PPO

## 2012-11-29 ENCOUNTER — Ambulatory Visit: Payer: BC Managed Care – PPO | Admitting: Family Medicine

## 2012-12-10 ENCOUNTER — Other Ambulatory Visit (INDEPENDENT_AMBULATORY_CARE_PROVIDER_SITE_OTHER): Payer: BC Managed Care – PPO

## 2012-12-10 DIAGNOSIS — R7309 Other abnormal glucose: Secondary | ICD-10-CM

## 2012-12-10 DIAGNOSIS — R739 Hyperglycemia, unspecified: Secondary | ICD-10-CM

## 2012-12-10 LAB — HEMOGLOBIN A1C: Hgb A1c MFr Bld: 6.2 % (ref 4.6–6.5)

## 2012-12-10 LAB — BASIC METABOLIC PANEL
BUN: 15 mg/dL (ref 6–23)
CO2: 27 mEq/L (ref 19–32)
Calcium: 8.8 mg/dL (ref 8.4–10.5)
Chloride: 102 mEq/L (ref 96–112)
Creatinine, Ser: 0.9 mg/dL (ref 0.4–1.5)
GFR: 86.37 mL/min (ref >60.00)
Glucose, Bld: 119 mg/dL — ABNORMAL HIGH (ref 70–99)
Potassium: 4 mEq/L (ref 3.5–5.1)
Sodium: 136 mEq/L (ref 135–145)

## 2012-12-10 LAB — MICROALBUMIN / CREATININE URINE RATIO
Creatinine,U: 98.9 mg/dL
Microalb Creat Ratio: 4.3 mg/g (ref 0.0–30.0)
Microalb, Ur: 4.3 mg/dL — ABNORMAL HIGH (ref 0.0–1.9)

## 2012-12-11 ENCOUNTER — Encounter: Payer: Self-pay | Admitting: Family Medicine

## 2012-12-17 ENCOUNTER — Encounter: Payer: Self-pay | Admitting: Family Medicine

## 2012-12-18 ENCOUNTER — Ambulatory Visit: Payer: BC Managed Care – PPO | Admitting: Family Medicine

## 2012-12-25 ENCOUNTER — Ambulatory Visit (INDEPENDENT_AMBULATORY_CARE_PROVIDER_SITE_OTHER): Payer: BC Managed Care – PPO | Admitting: Family Medicine

## 2012-12-25 ENCOUNTER — Encounter: Payer: Self-pay | Admitting: Family Medicine

## 2012-12-25 VITALS — BP 102/72 | Temp 97.7°F | Wt 262.0 lb

## 2012-12-25 DIAGNOSIS — IMO0002 Reserved for concepts with insufficient information to code with codable children: Secondary | ICD-10-CM

## 2012-12-25 DIAGNOSIS — E1165 Type 2 diabetes mellitus with hyperglycemia: Secondary | ICD-10-CM

## 2012-12-25 DIAGNOSIS — IMO0001 Reserved for inherently not codable concepts without codable children: Secondary | ICD-10-CM

## 2012-12-25 NOTE — Patient Instructions (Signed)
Continue your current medication diet and exercise program  Return in December for your annual complete exam  Since your blood sugars back to normal just check it once weekly

## 2012-12-25 NOTE — Progress Notes (Signed)
  Subjective:    Patient ID: Keith [name], male    DOB: [birth year], 67 y.o.   MRN: [id number]  HPI Keith [name] is a 67 year old male nonsmoker who comes in today for followup of diabetes type 2. He has done a wonderful job with his diet and exercising losing about 23 pounds in the past 3 months. His A1c is dropped from 9.9% 4 months ago to 6.2% now. His blood sugars back then when the 190s and now they're in the 110-120 range. No hypoglycemia. Microalbumin is dropped from 6.0 down to 4.3   Review of Systems    review of systems negative Objective:   Physical Exam Well-developed well-nourished male in no acute distress BP 102/72 weight 262       Assessment & Plan:  Diabetes at goal continue current therapy followup CPX when due

## 2013-01-07 ENCOUNTER — Encounter: Payer: Self-pay | Admitting: Family Medicine

## 2013-01-07 MED ORDER — GLUCOSE BLOOD VI STRP: 1.0000 | ORAL_STRIP | Freq: Every day | Status: DC

## 2013-03-08 ENCOUNTER — Other Ambulatory Visit: Payer: Self-pay | Admitting: Family Medicine

## 2013-03-11 ENCOUNTER — Encounter: Payer: Self-pay | Admitting: Family Medicine

## 2013-04-17 ENCOUNTER — Other Ambulatory Visit: Payer: Self-pay

## 2013-04-19 ENCOUNTER — Ambulatory Visit (INDEPENDENT_AMBULATORY_CARE_PROVIDER_SITE_OTHER): Payer: Medicare Other | Admitting: Family Medicine

## 2013-04-19 ENCOUNTER — Encounter: Payer: Self-pay | Admitting: Family Medicine

## 2013-04-19 VITALS — BP 132/90 | HR 87 | Temp 98.7°F | Wt 263.0 lb

## 2013-04-19 DIAGNOSIS — R319 Hematuria, unspecified: Secondary | ICD-10-CM

## 2013-04-19 LAB — POCT URINALYSIS DIPSTICK
Glucose, UA: NEGATIVE
Leukocytes, UA: NEGATIVE
Nitrite, UA: NEGATIVE
Spec Grav, UA: >=1.03
Urobilinogen, UA: 1
pH, UA: 5

## 2013-04-19 MED ORDER — CIPROFLOXACIN HCL 500 MG PO TABS: 500.0000 mg | ORAL_TABLET | Freq: Two times a day (BID) | ORAL | Status: DC

## 2013-04-19 NOTE — Patient Instructions (Addendum)
Stay well hydrated Start antibiotic. Avoid aspirin or NSAIDS. We will set up urology appointment.

## 2013-04-19 NOTE — Progress Notes (Signed)
Subjective:    Patient ID: Keith [name], male    DOB: 20-Aug-1946, 67 y.o.   MRN: [id number]  HPI Patient seen as a work in. He has new issue of some probable gross hematuria noted this past Monday. Cleared up during the week and then today after he exercised 20 minutes [facility]  he had episode of gross hematuria. He has had 2 more episodes today. Possibly some mild burning with urination. No history of kidney stones. Denies any back pain. Nonsmoker.  He has lost about 20 pounds this year but started exercising and he attributes the weight loss to his efforts. Has had slightly diminished appetite but only for the past week.  Patient had physical back in December and urinalysis normal at that time. PSA 1.8 one back in December 2013. No aspirin use. No anticoagulant use. No history of previous gross hematuria Has history of BPH and takes Flomax.  Past Medical History  Diagnosis Date  . Hypertension   . Benign prostatic hypertrophy     takes Flomax daily  . ED (erectile dysfunction)   . Complication of anesthesia     hard to wake up  . Hyperlipidemia   . Sleep apnea     sleep study done 20+yrs ago  . Arthritis     bil knees  . Anxiety     takes Celexa   Past Surgical History  Procedure Laterality Date  . Colonoscopy    . Knee arthroscopy  09/07/2010    left knee  . Cataract extraction w/ intraocular lens  implant, bilateral  03/13/2003  . Retinal detachment repair w/ scleral buckle le  10/14/2002    Dr Zadie Rhine  . Knee arthroscopy  05/27/2000    left knee  . Tonsillectomy      age 70  . Vasectomy  1982  . Total knee arthroplasty  08/01/2011    Procedure: TOTAL KNEE ARTHROPLASTY;  Surgeon: Lorn Junes, MD;  Location: [facility];  Service: Orthopedics;  Laterality: Right;  TOTAL KNEE ARTHROPLASTY  RIGHT SIDE    reports that he has never smoked. He does not have any smokeless tobacco history on file. He reports that he does not drink alcohol or use illicit drugs. family  history includes Arthritis in his other; Cancer in his other; Coronary artery disease in his other; Heart disease in his other; Heart failure in his father; and Hypertension in his mother.  There is no history of Anesthesia problems, and Hypotension, and Malignant hyperthermia, and Pseudochol deficiency, . Allergies  Allergen Reactions  . Penicillins Anaphylaxis    "eyes swell shut"      Review of Systems  Constitutional: Negative for fever, chills, appetite change and unexpected weight change.  Respiratory: Negative for cough and shortness of breath.   Cardiovascular: Negative for chest pain.  Gastrointestinal: Negative for abdominal pain and blood in stool.  Genitourinary: Positive for hematuria. Negative for urgency, frequency, decreased urine volume and difficulty urinating.  Musculoskeletal: Negative for back pain.       Objective:   Physical Exam  Constitutional: He appears well-developed and well-nourished.  Cardiovascular: Normal rate and regular rhythm.   Pulmonary/Chest: Effort normal and breath sounds normal. No respiratory distress. He has no wheezes. He has no rales.  Abdominal: Soft. There is no tenderness.  Genitourinary:  Prostate is mildly enlarged but symmetric and nontender with no mass          Assessment & Plan:  Gross hematuria. Urine dipstick does not  suggest likely infection. He has no evidence for nitrites or leukocytes. Urine culture obtained. Set up urology referral. Avoid aspirin. Start Cipro 500 mg twice a day pending culture results

## 2013-04-21 ENCOUNTER — Encounter: Payer: Self-pay | Admitting: Family Medicine

## 2013-04-21 LAB — URINE CULTURE
Colony Count: NO GROWTH
Organism ID, Bacteria: NO GROWTH

## 2013-04-23 ENCOUNTER — Encounter: Payer: Self-pay | Admitting: Family Medicine

## 2013-04-29 DIAGNOSIS — N529 Male erectile dysfunction, unspecified: Secondary | ICD-10-CM | POA: Diagnosis not present

## 2013-04-29 DIAGNOSIS — N138 Other obstructive and reflux uropathy: Secondary | ICD-10-CM | POA: Diagnosis not present

## 2013-04-29 DIAGNOSIS — R310 Gross hematuria: Secondary | ICD-10-CM | POA: Diagnosis not present

## 2013-04-29 DIAGNOSIS — N401 Enlarged prostate with lower urinary tract symptoms: Secondary | ICD-10-CM | POA: Diagnosis not present

## 2013-04-29 DIAGNOSIS — R3915 Urgency of urination: Secondary | ICD-10-CM | POA: Diagnosis not present

## 2013-05-07 DIAGNOSIS — N200 Calculus of kidney: Secondary | ICD-10-CM | POA: Diagnosis not present

## 2013-05-07 DIAGNOSIS — R310 Gross hematuria: Secondary | ICD-10-CM | POA: Diagnosis not present

## 2013-05-14 DIAGNOSIS — N138 Other obstructive and reflux uropathy: Secondary | ICD-10-CM | POA: Diagnosis not present

## 2013-05-14 DIAGNOSIS — N401 Enlarged prostate with lower urinary tract symptoms: Secondary | ICD-10-CM | POA: Diagnosis not present

## 2013-05-14 DIAGNOSIS — N210 Calculus in bladder: Secondary | ICD-10-CM | POA: Diagnosis not present

## 2013-05-14 DIAGNOSIS — R310 Gross hematuria: Secondary | ICD-10-CM | POA: Diagnosis not present

## 2013-05-16 ENCOUNTER — Other Ambulatory Visit: Payer: Self-pay | Admitting: Urology

## 2013-06-12 ENCOUNTER — Encounter (HOSPITAL_COMMUNITY): Payer: Self-pay | Admitting: Pharmacy Technician

## 2013-06-13 ENCOUNTER — Other Ambulatory Visit (HOSPITAL_COMMUNITY): Payer: Self-pay | Admitting: *Deleted

## 2013-06-14 ENCOUNTER — Encounter (HOSPITAL_COMMUNITY): Payer: Self-pay

## 2013-06-14 ENCOUNTER — Encounter (HOSPITAL_COMMUNITY)
Admission: RE | Admit: 2013-06-14 | Discharge: 2013-06-14 | Disposition: A | Discharge status: Home / Self Care | Payer: Medicare Other | Source: Ambulatory Visit | Attending: Urology | Admitting: Urology

## 2013-06-14 ENCOUNTER — Encounter: Payer: Self-pay | Admitting: Family Medicine

## 2013-06-14 ENCOUNTER — Ambulatory Visit (HOSPITAL_COMMUNITY)
Admission: RE | Admit: 2013-06-14 | Discharge: 2013-06-14 | Disposition: A | Discharge status: Home / Self Care | Payer: Medicare Other | Source: Ambulatory Visit | Attending: Urology | Admitting: Urology

## 2013-06-14 DIAGNOSIS — I517 Cardiomegaly: Secondary | ICD-10-CM | POA: Diagnosis not present

## 2013-06-14 DIAGNOSIS — Z01818 Encounter for other preprocedural examination: Secondary | ICD-10-CM | POA: Diagnosis not present

## 2013-06-14 DIAGNOSIS — R310 Gross hematuria: Secondary | ICD-10-CM | POA: Diagnosis not present

## 2013-06-14 DIAGNOSIS — Z01810 Encounter for preprocedural cardiovascular examination: Secondary | ICD-10-CM | POA: Insufficient documentation

## 2013-06-14 DIAGNOSIS — Z01812 Encounter for preprocedural laboratory examination: Secondary | ICD-10-CM | POA: Diagnosis not present

## 2013-06-14 HISTORY — DX: Other specified postprocedural states: Z98.890

## 2013-06-14 HISTORY — DX: Other reaction to spinal and lumbar puncture: G97.1

## 2013-06-14 HISTORY — DX: Nausea with vomiting, unspecified: R11.2

## 2013-06-14 LAB — CBC
HCT: 39.1 % (ref 39.0–52.0)
Hemoglobin: 13.4 g/dL (ref 13.0–17.0)
MCH: 31.3 pg (ref 26.0–34.0)
MCHC: 34.3 g/dL (ref 30.0–36.0)
MCV: 91.4 fL (ref 78.0–100.0)
Platelets: 274 10*3/uL (ref 150–400)
RBC: 4.28 MIL/uL (ref 4.22–5.81)
RDW: 13 % (ref 11.5–15.5)
WBC: 7.4 10*3/uL (ref 4.0–10.5)

## 2013-06-14 LAB — BASIC METABOLIC PANEL
BUN: 19 mg/dL (ref 6–23)
CO2: 27 mEq/L (ref 19–32)
Calcium: 9.4 mg/dL (ref 8.4–10.5)
Chloride: 103 mEq/L (ref 96–112)
Creatinine, Ser: 1.02 mg/dL (ref 0.50–1.35)
GFR calc Af Amer: 86 mL/min — ABNORMAL LOW (ref >90)
GFR calc non Af Amer: 75 mL/min — ABNORMAL LOW (ref >90)
Glucose, Bld: 114 mg/dL — ABNORMAL HIGH (ref 70–99)
Potassium: 4.3 mEq/L (ref 3.5–5.1)
Sodium: 139 mEq/L (ref 135–145)

## 2013-06-14 NOTE — Patient Instructions (Addendum)
20      Your procedure is scheduled on:  Tuesday 06/25/2013  Report to [facility] at [place]  AM.  Call this number if you have problems the night before or morning of surgery:  [phone]   Remember:             IF YOU USE CPAP,BRING MASK AND TUBING AM OF SURGERY!   Do not eat food or drink liquids AFTER MIDNIGHT!  Take these medicines the morning of surgery with A SIP OF WATER: NONE   Do not bring valuables to the hospital. [place].  Mar[place] Kitchen  Leave suitcase in the car. After surgery it may be brought to your room.  For patients admitted to the hospital, checkout time is 11:00 AM the day of              Discharge.    DO NOT WEAR JEWELRY , MAKE-UP, LOTIONS,POWDERS,PERFUMES!             WOMEN -DO NOT SHAVE LEGS OR UNDERARMS 12 HRS. BEFORE SURGERY!               MEN MAY SHAVE AS USUAL!             CONTACTS,DENTURES OR BRIDGEWORK, FALSE EYELASHES MAY  NOT BE WORN INTO SURGERY!                                           Patients discharged the day of surgery will not be allowed to drive home. If going home the same day of surgery, must have someone stay with you first 24 hrs.at home and arrange for someone to drive you home from the [place]: N/A   Special Instructions:             Please read over the following fact sheets that you were given:             1. [place].Lamon Rotundo,RN,BSN     [phone]                FAILURE TO FOLLOW THESE INSTRUCTIONS MAY RESULT IN    CANCELLATION OF YOUR SURGERY!               Patient Signature:___________________________

## 2013-06-14 NOTE — Progress Notes (Signed)
Living will and Health care Power of Attorney on chart.

## 2013-06-24 NOTE — H&P (Signed)
ctive Problems Problems  1. Benign Prostatic Hypertrophy With Urinary Obstruction 600.01 2. Feelings Of Urinary Urgency 788.63 3. Gross Hematuria 599.71 4. Male Erectile Disorder Due To Physical Condition 607.84 5. Nocturia 788.43 6. Urinary Frequency 788.41 7. Visit For: Screening Exam Malignant Neoplasm Prostate V76.44  History of Present Illness  Keith [name] returns today in f/u from a CT done for hematuria.  He was found to have two bladder stones with the largest being 1.8cm and he has a large prostate with a large middle lobe.  He does have voiding symptoms with hesitancy, frequency q1hr, nocturia x 4, slow stream and urgency.    It is worse after sitting.  He had recurrent hematuria after a work out on Friday.   Past Medical History Problems  1. History of  Nephrolithiasis V13.01 2. History of  Obstructive Sleep Apnea 327.23 3. History of  Phimosis 605  Surgical History Problems  1. History of  Cataract Surgery 2. History of  Eye Surgery Results Retina Reattached 3. History of  Knee Surgery  Current Meds 1. Aspirin 81 MG Oral Tablet; Take 1 tablet daily; Therapy: (Recorded:14Jul2009) to 2. CeleBREX 200 MG Oral Capsule; Therapy: [id number] to 3. Citalopram Hydrobromide 40 MG Oral Tablet; Therapy: [id number] to 4. Fish Oil CAPS; Therapy: (Recorded:18Aug2014) to 5. Glucosamine CAPS; Therapy: (Recorded:14Jul2009) to 6. Levitra 20 MG Oral Tablet; TAKE AS DIRECTED; Therapy: 14Jul2009 to (Last Rx:14Jul2009) 7. MetFORMIN HCl 1000 MG Oral Tablet; Therapy: 30Jun2014 to 8. Potassium 99 MG Oral Tablet; Therapy: (Recorded:14Jul2009) to 9. Stendra 200 MG Oral Tablet; TAKE ONE TABLET AS NEEDED; Therapy: [id number] to (Last  Rx:18Aug2014)  Requested for: [id number] 10. Tamsulosin HCl 0.4 MG Oral Capsule; Therapy: [id number] to  Allergies Medication  1. Penicillins  Family History Problems  1. Family history of  Family Health Status Number Of Children 1 child 2. Family history of   Father Deceased At Age ____ Died at 47 from CHF 3. Family history of  Mother Deceased At Age ____ Died at 97 from CHF  Social History Problems  1. Alcohol Use occassionally 2. Marital History - Currently Married 3. Never A Smoker 4. Occupation: Retired Producer, television/film/video  5. History of  Caffeine Use  Review of Systems  Constitutional: feeling tired (fatigue).  Cardiovascular: no chest pain.  Respiratory: no shortness of breath.    Vitals Vital Signs [Data Includes: Last 1 Day]  02Sep2014 09:30AM  Blood Pressure: 136 / 72 Temperature: 97.9 F Heart Rate: 83  Physical Exam Constitutional: Well nourished and well developed . No acute distress.  Pulmonary: No respiratory distress and normal respiratory rhythm and effort.  Cardiovascular: Heart rate and rhythm are normal . No peripheral edema.    Results/Data  The following images/tracing/specimen were independently visualized:  I have reviewed the CT films and report. I have reviewed his Prostate [place] and the prostate is 127cc.  The following clinical lab reports were reviewed:  He reports that his recent PSA was 1.85. Selected Results  AU CT-HEMATURIA PROTOCOL [id number] 12:00AM [name]   Test Name Result Flag Reference  ** RADIOLOGY REPORT BY [facility] **   CLINICAL DATA: Gross hematuria for 1 day 2 weeks ago. History of renal calculi and BPH.  EXAM: CT ABDOMEN AND PELVIS WITHOUT AND WITH CONTRAST  TECHNIQUE: Multidetector CT imaging of the abdomen and pelvis was performed without contrast material in one or both body regions, followed by contrast material(s) and further sections in one or both body regions.  CONTRAST: 125 ml Isovue 300  intravenously.  COMPARISON: Report only from prior study 12/02/2002.  FINDINGS: Pre contrast images demonstrate bilateral renal calculi, the largest in the lower pole of the right kidney, measuring 4 mm on image 53. There is no hydronephrosis or ureteral calculus. However,  there are 2 bladder calculi, measuring up to 1.9 cm in diameter. Post-contrast, both kidneys enhance normally. There is no evidence of renal mass. Delayed images result in segmental visualization of the ureters. No urothelial abnormalities are identified. The bladder is mildly thick walled. There is moderate enlargement of the prostate gland with prominent extension of the median lobe into the bladder lumen. A mucosal lesion involving the bladder base is not strongly suspected, although difficult to completely exclude.  The lung bases are clear. There is no significant pleural or pericardial effusion. Coronary artery calcifications are noted.  The liver demonstrates steatosis without focal abnormality. The gallbladder, spleen and pancreas appear normal. There is no adrenal mass.  There is mild aortoiliac atherosclerosis without evidence of aneurysm. No enlarged abdominal pelvic lymph nodes are seen. The stomach, small bowel and colon appear normal.  There is a moderate size umbilical hernia containing only fat. In addition, there are bilateral inguinal hernias containing fat. No acute osseous findings identified. There is some sclerosis anteriorly within the left femoral head which could reflect avascular necrosis.  IMPRESSION: 1. Bilateral nonobstructing renal calculi. No evidence of ureteral calculus or hydronephrosis.  2. There are 2 large bladder calculi associated with bladder wall thickening and prominent protuberance of the median lobe of the prostate gland into the bladder lumen. These findings are consistent with chronic bladder outlet obstruction. A mucosal lesion involving the bladder base is difficult to completely exclude.  3. No other evidence of urothelial lesion or renal mass.  4. Umbilical and bilateral inguinal hernias.   Electronically Signed  By: Camie Patience  On: 05/07/2013 12:26   BUN & CREATININE [id number] 04:27PM [name]  SPECIMEN TYPE: BLOOD    Test Name Result Flag Reference  CREATININE 0.90 mg/dL  0.50-1.50  BUN 16 mg/dL  6-23  Est GFR, African American >89 mL/min    Est GFR, NonAfrican American 89 mL/min    THE ESTIMATED GFR IS A CALCULATION VALID FOR ADULTS (>=63 YEARS OLD) THAT USES THE CKD-EPI ALGORITHM TO ADJUST FOR AGE AND SEX. IT IS   NOT TO BE USED FOR CHILDREN, PREGNANT WOMEN, HOSPITALIZED PATIENTS,    PATIENTS ON DIALYSIS, OR WITH RAPIDLY CHANGING KIDNEY FUNCTION. ACCORDING TO THE NKDEP, EGFR >89 IS NORMAL, 60-89 SHOWS MILD IMPAIRMENT, 30-59 SHOWS MODERATE IMPAIRMENT, 15-29 SHOWS SEVERE IMPAIRMENT AND <15 IS ESRD.   Procedure  Procedure: Transrectal ultrasound of the prostate.  Local Anesthesia:  Antibiotic prophylaxis:  Procedure Note: The transrectal ultrasound probe was placed into the rectum. Prostate width measured 5.5cm, height of 6cm and length of 7.4cm . Prostate volume was measured to be 127 cc. The prostate was homogenous without lesions on ultrasound. Calcifications were noted. (that are at the junction of the TZ and PZ in the mid and base of the prostate. ) . The seminal vesicles appeared normal. A median lobe was present. He has to bladder stones as noted on the CT.  Patient Status:. The patient tolerated the procedure well.  Complications:. None.    Assessment Assessed  1. Bladder Calculus 594.1 2. Benign Prostatic Hypertrophy With Urinary Obstruction 600.01 3. Gross Hematuria 599.71   He has BPH with BOO and bladder stones.   He has a very large middle lobe and the largest  stone is 1.8cm   Plan Benign Prostatic Hypertrophy With Urinary Obstruction (600.01)  1. PROSTATE U/S  Done: 02Sep2014 12:00AM Gross Hematuria (599.71)  2. Cysto      I am going to get a Prostate [place] volume study to make sure he is a candidate for a TURP and if he is, I will get him set up for a cystolithalopaxy and TURP.   I revewed the risks of bleeding, infection, bladder wall and ureteral injury, fluid overload, need  for secondary procedures or open conversion, strictures, incontinence, thrombotic events and anesthetic complications.      Discussion/Summary   CC: Dr. Carolann Littler.

## 2013-06-25 ENCOUNTER — Encounter (HOSPITAL_COMMUNITY)
Admission: RE | Disposition: A | Discharge status: Home / Self Care | Payer: Self-pay | Source: Ambulatory Visit | Attending: Urology

## 2013-06-25 ENCOUNTER — Encounter (HOSPITAL_COMMUNITY): Payer: Medicare Other | Admitting: Anesthesiology

## 2013-06-25 ENCOUNTER — Observation Stay (HOSPITAL_COMMUNITY)
Admission: RE | Admit: 2013-06-25 | Discharge: 2013-06-27 | Disposition: A | Discharge status: Home / Self Care | Payer: Medicare Other | Source: Ambulatory Visit | Attending: Urology | Admitting: Urology

## 2013-06-25 ENCOUNTER — Encounter (HOSPITAL_COMMUNITY): Payer: Self-pay | Admitting: *Deleted

## 2013-06-25 ENCOUNTER — Ambulatory Visit (HOSPITAL_COMMUNITY): Payer: Medicare Other | Admitting: Anesthesiology

## 2013-06-25 DIAGNOSIS — N529 Male erectile dysfunction, unspecified: Secondary | ICD-10-CM | POA: Insufficient documentation

## 2013-06-25 DIAGNOSIS — R350 Frequency of micturition: Secondary | ICD-10-CM | POA: Insufficient documentation

## 2013-06-25 DIAGNOSIS — N210 Calculus in bladder: Secondary | ICD-10-CM | POA: Diagnosis present

## 2013-06-25 DIAGNOSIS — R3915 Urgency of urination: Secondary | ICD-10-CM | POA: Diagnosis not present

## 2013-06-25 DIAGNOSIS — R351 Nocturia: Secondary | ICD-10-CM | POA: Diagnosis not present

## 2013-06-25 DIAGNOSIS — N400 Enlarged prostate without lower urinary tract symptoms: Secondary | ICD-10-CM | POA: Diagnosis not present

## 2013-06-25 DIAGNOSIS — E119 Type 2 diabetes mellitus without complications: Secondary | ICD-10-CM | POA: Diagnosis not present

## 2013-06-25 DIAGNOSIS — R310 Gross hematuria: Secondary | ICD-10-CM | POA: Diagnosis not present

## 2013-06-25 DIAGNOSIS — D62 Acute posthemorrhagic anemia: Secondary | ICD-10-CM | POA: Insufficient documentation

## 2013-06-25 DIAGNOSIS — N401 Enlarged prostate with lower urinary tract symptoms: Secondary | ICD-10-CM | POA: Diagnosis present

## 2013-06-25 DIAGNOSIS — N320 Bladder-neck obstruction: Secondary | ICD-10-CM | POA: Insufficient documentation

## 2013-06-25 DIAGNOSIS — N138 Other obstructive and reflux uropathy: Secondary | ICD-10-CM | POA: Diagnosis not present

## 2013-06-25 DIAGNOSIS — Z7982 Long term (current) use of aspirin: Secondary | ICD-10-CM | POA: Diagnosis not present

## 2013-06-25 DIAGNOSIS — N4289 Other specified disorders of prostate: Secondary | ICD-10-CM | POA: Diagnosis not present

## 2013-06-25 DIAGNOSIS — G4733 Obstructive sleep apnea (adult) (pediatric): Secondary | ICD-10-CM | POA: Insufficient documentation

## 2013-06-25 DIAGNOSIS — Z79899 Other long term (current) drug therapy: Secondary | ICD-10-CM | POA: Insufficient documentation

## 2013-06-25 HISTORY — PX: TRANSURETHRAL RESECTION OF PROSTATE: SHX73

## 2013-06-25 HISTORY — DX: Calculus in bladder: N21.0

## 2013-06-25 HISTORY — DX: Other obstructive and reflux uropathy: N13.8

## 2013-06-25 HISTORY — DX: Benign prostatic hyperplasia with lower urinary tract symptoms: N40.1

## 2013-06-25 HISTORY — DX: Type 2 diabetes mellitus without complications: E11.9

## 2013-06-25 HISTORY — DX: Other obstructive and reflux uropathy: N40.1

## 2013-06-25 HISTORY — PX: CYSTOSCOPY WITH LITHOLAPAXY: SHX1425

## 2013-06-25 LAB — GLUCOSE, CAPILLARY
Glucose-Capillary: 123 mg/dL — ABNORMAL HIGH (ref 70–99)
Glucose-Capillary: 118 mg/dL — ABNORMAL HIGH (ref 70–99)
Glucose-Capillary: 128 mg/dL — ABNORMAL HIGH (ref 70–99)
Glucose-Capillary: 82 mg/dL (ref 70–99)

## 2013-06-25 SURGERY — CYSTOSCOPY, WITH BLADDER CALCULUS LITHOLAPAXY
Anesthesia: General | Wound class: Clean Contaminated

## 2013-06-25 MED ORDER — SODIUM CHLORIDE 0.9 % IR SOLN
Status: DC | PRN
  Administered 2013-06-25: 27000 mL via INTRAVESICAL

## 2013-06-25 MED ORDER — CIPROFLOXACIN IN D5W 400 MG/200ML IV SOLN
400.0000 mg | INTRAVENOUS | Status: AC
  Administered 2013-06-25: 400 mg via INTRAVENOUS

## 2013-06-25 MED ORDER — NEOSTIGMINE METHYLSULFATE 1 MG/ML IJ SOLN
INTRAMUSCULAR | Status: DC | PRN
  Administered 2013-06-25: 4 mg via INTRAMUSCULAR

## 2013-06-25 MED ORDER — POTASSIUM CHLORIDE IN NACL 20-0.45 MEQ/L-% IV SOLN
INTRAVENOUS | Status: DC
  Administered 2013-06-25 – 2013-06-26 (×2): via INTRAVENOUS
  Filled 2013-06-25 (×5): qty 1000

## 2013-06-25 MED ORDER — PROMETHAZINE HCL 25 MG/ML IJ SOLN: 6.2500 mg | INTRAMUSCULAR | Status: DC | PRN

## 2013-06-25 MED ORDER — HYDROMORPHONE HCL PF 1 MG/ML IJ SOLN
INTRAMUSCULAR | Status: AC
  Filled 2013-06-25: qty 1

## 2013-06-25 MED ORDER — INSULIN ASPART 100 UNIT/ML ~~LOC~~ SOLN
0.0000 [IU] | Freq: Three times a day (TID) | SUBCUTANEOUS | Status: DC
  Administered 2013-06-25: 2 [IU] via SUBCUTANEOUS
  Administered 2013-06-26: 3 [IU] via SUBCUTANEOUS
  Administered 2013-06-26 – 2013-06-27 (×3): 2 [IU] via SUBCUTANEOUS

## 2013-06-25 MED ORDER — ONDANSETRON HCL 4 MG/2ML IJ SOLN
INTRAMUSCULAR | Status: DC | PRN
  Administered 2013-06-25: 4 mg via INTRAMUSCULAR

## 2013-06-25 MED ORDER — POTASSIUM GLUCONATE 595 (99 K) MG PO TABS
595.0000 mg | ORAL_TABLET | Freq: Every day | ORAL | Status: DC
  Administered 2013-06-26 – 2013-06-27 (×2): 595 mg via ORAL
  Filled 2013-06-25 (×2): qty 1

## 2013-06-25 MED ORDER — CIPROFLOXACIN HCL 500 MG PO TABS
500.0000 mg | ORAL_TABLET | Freq: Two times a day (BID) | ORAL | Status: DC
  Administered 2013-06-25 – 2013-06-27 (×4): 500 mg via ORAL
  Filled 2013-06-25 (×5): qty 1

## 2013-06-25 MED ORDER — LACTATED RINGERS IV SOLN: INTRAVENOUS | Status: DC

## 2013-06-25 MED ORDER — CIPROFLOXACIN IN D5W 400 MG/200ML IV SOLN
INTRAVENOUS | Status: AC
  Filled 2013-06-25: qty 200

## 2013-06-25 MED ORDER — FENTANYL CITRATE 0.05 MG/ML IJ SOLN
INTRAMUSCULAR | Status: DC | PRN
  Administered 2013-06-25 (×4): 50 ug via INTRAVENOUS
  Administered 2013-06-25: 100 ug via INTRAVENOUS
  Administered 2013-06-25: 50 ug via INTRAVENOUS

## 2013-06-25 MED ORDER — CITALOPRAM HYDROBROMIDE 40 MG PO TABS
40.0000 mg | ORAL_TABLET | Freq: Every evening | ORAL | Status: DC
  Administered 2013-06-25 – 2013-06-26 (×2): 40 mg via ORAL
  Filled 2013-06-25 (×3): qty 1

## 2013-06-25 MED ORDER — BISACODYL 10 MG RE SUPP: 10.0000 mg | Freq: Every day | RECTAL | Status: DC | PRN

## 2013-06-25 MED ORDER — ROCURONIUM BROMIDE 100 MG/10ML IV SOLN
INTRAVENOUS | Status: DC | PRN
  Administered 2013-06-25: 10 mg via INTRAVENOUS
  Administered 2013-06-25: 30 mg via INTRAVENOUS

## 2013-06-25 MED ORDER — HYDROCODONE-ACETAMINOPHEN 5-325 MG PO TABS
1.0000 | ORAL_TABLET | ORAL | Status: DC | PRN
  Administered 2013-06-25: 2 via ORAL
  Filled 2013-06-25: qty 2

## 2013-06-25 MED ORDER — HYDROMORPHONE HCL PF 1 MG/ML IJ SOLN
0.2500 mg | INTRAMUSCULAR | Status: DC | PRN
  Administered 2013-06-25 (×4): 0.5 mg via INTRAVENOUS

## 2013-06-25 MED ORDER — MIDAZOLAM HCL 2 MG/2ML IJ SOLN
0.5000 mg | INTRAMUSCULAR | Status: DC | PRN
  Administered 2013-06-25 (×2): 0.5 mg via INTRAVENOUS

## 2013-06-25 MED ORDER — PROPOFOL 10 MG/ML IV BOLUS
INTRAVENOUS | Status: DC | PRN
  Administered 2013-06-25: 200 mg via INTRAVENOUS
  Administered 2013-06-25: 50 mg via INTRAVENOUS

## 2013-06-25 MED ORDER — MIDAZOLAM HCL 2 MG/2ML IJ SOLN
INTRAMUSCULAR | Status: AC
  Filled 2013-06-25: qty 2

## 2013-06-25 MED ORDER — BELLADONNA ALKALOIDS-OPIUM 16.2-60 MG RE SUPP: 1.0000 | Freq: Four times a day (QID) | RECTAL | Status: DC | PRN

## 2013-06-25 MED ORDER — LACTATED RINGERS IV SOLN
INTRAVENOUS | Status: DC | PRN
  Administered 2013-06-25 (×2): via INTRAVENOUS

## 2013-06-25 MED ORDER — GLYCOPYRROLATE 0.2 MG/ML IJ SOLN
INTRAMUSCULAR | Status: DC | PRN
  Administered 2013-06-25: 0.4 mg via INTRAVENOUS

## 2013-06-25 MED ORDER — LIDOCAINE HCL 1 % IJ SOLN
INTRAMUSCULAR | Status: DC | PRN
  Administered 2013-06-25: 100 mg via INTRADERMAL

## 2013-06-25 MED ORDER — BACITRACIN-NEOMYCIN-POLYMYXIN 400-5-5000 EX OINT
1.0000 "application " | TOPICAL_OINTMENT | Freq: Three times a day (TID) | CUTANEOUS | Status: DC | PRN
  Administered 2013-06-25: 1 via TOPICAL
  Filled 2013-06-25: qty 1

## 2013-06-25 MED ORDER — ACETAMINOPHEN 325 MG PO TABS: 650.0000 mg | ORAL_TABLET | ORAL | Status: DC | PRN

## 2013-06-25 MED ORDER — ONDANSETRON HCL 4 MG/2ML IJ SOLN: 4.0000 mg | INTRAMUSCULAR | Status: DC | PRN

## 2013-06-25 MED ORDER — SODIUM CHLORIDE 0.9 % IR SOLN
3000.0000 mL | Status: AC
  Administered 2013-06-25: 3000 mL

## 2013-06-25 MED ORDER — HYDROMORPHONE HCL PF 1 MG/ML IJ SOLN: 0.5000 mg | INTRAMUSCULAR | Status: DC | PRN

## 2013-06-25 MED ORDER — ZOLPIDEM TARTRATE 5 MG PO TABS
5.0000 mg | ORAL_TABLET | Freq: Every evening | ORAL | Status: DC | PRN
  Administered 2013-06-25 – 2013-06-26 (×2): 5 mg via ORAL
  Filled 2013-06-25 (×2): qty 1

## 2013-06-25 MED ORDER — DOCUSATE SODIUM 100 MG PO CAPS
100.0000 mg | ORAL_CAPSULE | Freq: Two times a day (BID) | ORAL | Status: DC
  Administered 2013-06-25 – 2013-06-27 (×4): 100 mg via ORAL
  Filled 2013-06-25 (×5): qty 1

## 2013-06-25 MED ORDER — POTASSIUM GLUCONATE 550 MG PO TABS: 1.0000 | ORAL_TABLET | Freq: Every day | ORAL | Status: DC

## 2013-06-25 MED ORDER — HYOSCYAMINE SULFATE 0.125 MG SL SUBL
0.1250 mg | SUBLINGUAL_TABLET | SUBLINGUAL | Status: DC | PRN
  Administered 2013-06-25: 0.125 mg via ORAL
  Filled 2013-06-25: qty 1

## 2013-06-25 MED ORDER — METFORMIN HCL 500 MG PO TABS
1000.0000 mg | ORAL_TABLET | Freq: Two times a day (BID) | ORAL | Status: DC
  Administered 2013-06-26 – 2013-06-27 (×3): 1000 mg via ORAL
  Filled 2013-06-25 (×5): qty 2

## 2013-06-25 MED ORDER — MIDAZOLAM HCL 5 MG/5ML IJ SOLN
INTRAMUSCULAR | Status: DC | PRN
  Administered 2013-06-25: 2 mg via INTRAVENOUS

## 2013-06-25 MED ORDER — ZOLPIDEM TARTRATE 5 MG PO TABS: 5.0000 mg | ORAL_TABLET | Freq: Every evening | ORAL | Status: DC | PRN

## 2013-06-25 SURGICAL SUPPLY — 31 items
BAG URINE DRAINAGE (UROLOGICAL SUPPLIES) ×2 IMPLANT
BAG URO CATCHER STRL LF (DRAPE) ×2 IMPLANT
BLADE SURG 15 STRL LF DISP TIS (BLADE) IMPLANT
BLADE SURG 15 STRL SS (BLADE)
CARTRIDGE STONEBREAK CO2 KIDNE (ELECTROSURGICAL) ×3 IMPLANT
CATH FOLEY 3WAY 30CC 22FR (CATHETERS) ×2 IMPLANT
CATH URET 5FR 28IN OPEN ENDED (CATHETERS) IMPLANT
CLOTH BEACON ORANGE TIMEOUT ST (SAFETY) ×2 IMPLANT
DRAPE CAMERA CLOSED 9X96 (DRAPES) ×2 IMPLANT
ELECT BUTTON HF 24-28F 2 30DE (ELECTRODE) IMPLANT
ELECT HF RESECT BIPO 24F 45 ND (CUTTING LOOP) ×1 IMPLANT
ELECT LOOP MED HF 24F 12D (CUTTING LOOP) ×1 IMPLANT
ELECT LOOP MED HF 24F 12D CBL (CLIP) ×1 IMPLANT
ELECT REM PT RETURN 9FT ADLT (ELECTROSURGICAL)
ELECTRODE REM PT RTRN 9FT ADLT (ELECTROSURGICAL) IMPLANT
FIBER LASER FLEXIVA 1000 (UROLOGICAL SUPPLIES) ×1 IMPLANT
FIBER LASER FLEXIVA 550 (UROLOGICAL SUPPLIES) IMPLANT
GLOVE SURG SS PI 8.0 STRL IVOR (GLOVE) IMPLANT
GOWN STRL REIN XL XLG (GOWN DISPOSABLE) ×2 IMPLANT
HOLDER FOLEY CATH W/STRAP (MISCELLANEOUS) ×1 IMPLANT
IV NS IRRIG 3000ML ARTHROMATIC (IV SOLUTION) ×4 IMPLANT
KIT ASPIRATION TUBING (SET/KITS/TRAYS/PACK) ×2 IMPLANT
LASER FIBER DISP 1000U (UROLOGICAL SUPPLIES) IMPLANT
MANIFOLD NEPTUNE II (INSTRUMENTS) ×2 IMPLANT
PACK CYSTO (CUSTOM PROCEDURE TRAY) ×2 IMPLANT
PROBE PNEUMATIC 1.6MM (ELECTROSURGICAL) ×1 IMPLANT
SHIELD EYE BINOCULAR (MISCELLANEOUS) ×2 IMPLANT
SUT ETHILON 3 0 PS 1 (SUTURE) IMPLANT
SYR 30ML LL (SYRINGE) IMPLANT
SYRINGE IRR TOOMEY STRL 70CC (SYRINGE) IMPLANT
TUBING CONNECTING 10 (TUBING) ×2 IMPLANT

## 2013-06-25 NOTE — Progress Notes (Signed)
Patient ID: Keith [name], male   DOB: 1946-06-06, 67 y.o.   MRN: [id number]  He is doing well post op with pink urine on slow irrigation.  He voices no complaints.

## 2013-06-25 NOTE — Brief Op Note (Signed)
06/25/2013  12:32 PM  PATIENT:  Keith [name]  67 y.o. male  PRE-OPERATIVE DIAGNOSIS:  BPH with Bladder Outlet Obstruction and Bladder Stone  POST-OPERATIVE DIAGNOSIS:  BPH with Bladder Outlet Obstruction and Bladder Stone  PROCEDURE:  Procedure(s): CYSTOSCOPY WITH LITHOLAPAXY >2.5cm (N/A) TRANSURETHRAL RESECTION OF THE PROSTATE WITH GYRUS INSTRUMENTS (N/A)  SURGEON:  Surgeon(s) and Role:    * [name], MD - Primary  PHYSICIAN ASSISTANT:   ASSISTANTS: none   ANESTHESIA:   general  EBL:  Total I/O In: 1000 [I.V.:1000] Out: -   BLOOD ADMINISTERED:none  DRAINS: Urinary Catheter (Foley)   LOCAL MEDICATIONS USED:  NONE  SPECIMEN:  Source of Specimen:  Prostate chips and stone fragements.  DISPOSITION OF SPECIMEN:  PATHOLOGY  COUNTS:  YES  TOURNIQUET:  * No tourniquets in log *  DICTATION: .Other Dictation: Dictation Number [id number]  PLAN OF CARE: Admit for overnight observation  PATIENT DISPOSITION:  PACU - hemodynamically stable.   Delay start of Pharmacological VTE agent (>24hrs) due to surgical blood loss or risk of bleeding: yes

## 2013-06-25 NOTE — Anesthesia Preprocedure Evaluation (Addendum)
Anesthesia Evaluation  Patient identified by MRN, date of birth, ID band Patient awake  General Assessment Comment:Denies PONV and H/P spinal headache.  Reviewed: Allergy & Precautions, H&P , NPO status , Patient's Chart, lab work & pertinent test results  History of Anesthesia Complications (+) PONV, POST - OP SPINAL HEADACHE and history of anesthetic complications  Airway Mallampati: II TM Distance: >3 FB Neck ROM: Full    Dental no notable dental hx.    Pulmonary sleep apnea ,  OSA has improved with weight loss. breath sounds clear to auscultation  Pulmonary exam normal       Cardiovascular Exercise Tolerance: Good hypertension, negative cardio ROS  Rhythm:Regular Rate:Normal  Off HTN meds for 3-4 years.   Neuro/Psych  Headaches, PSYCHIATRIC DISORDERS Anxiety    GI/Hepatic negative GI ROS, Neg liver ROS,   Endo/Other  diabetes, Type 2, Oral Hypoglycemic Agents  Renal/GU negative Renal ROS     Musculoskeletal negative musculoskeletal ROS (+)   Abdominal (+) + obese,   Peds  Hematology negative hematology ROS (+)   Anesthesia Other Findings   Reproductive/Obstetrics                         Anesthesia Physical Anesthesia Plan  ASA: III  Anesthesia Plan: General   Post-op Pain Management:    Induction: Intravenous  Airway Management Planned: LMA  Additional Equipment:   Intra-op Plan:   Post-operative Plan: Extubation in OR  Informed Consent: I have reviewed the patients History and Physical, chart, labs and discussed the procedure including the risks, benefits and alternatives for the proposed anesthesia with the patient or authorized representative who has indicated his/her understanding and acceptance.   Dental advisory given  Plan Discussed with: CRNA  Anesthesia Plan Comments:         Anesthesia Quick Evaluation

## 2013-06-25 NOTE — Interval H&P Note (Signed)
History and Physical Interval Note:  06/25/2013 10:22 AM  Keith [name]  has presented today for surgery, with the diagnosis of BPH with Bladder Outlet Obstruction and Bladder Stone  The various methods of treatment have been discussed with the patient and family. After consideration of risks, benefits and other options for treatment, the patient has consented to  Procedure(s): CYSTOSCOPY WITH LITHOLAPAXY (N/A) TRANSURETHRAL RESECTION OF THE PROSTATE WITH GYRUS INSTRUMENTS (N/A) as a surgical intervention .  The patient's history has been reviewed, patient examined, no change in status, stable for surgery.  I have reviewed the patient's chart and labs.  Questions were answered to the patient's satisfaction.     Keith [name] J

## 2013-06-25 NOTE — Transfer of Care (Signed)
Immediate Anesthesia Transfer of Care Note  Patient: Keith [name]  Procedure(s) Performed: Procedure(s) (LRB): CYSTOSCOPY WITH LITHOLAPAXY (N/A) TRANSURETHRAL RESECTION OF THE PROSTATE WITH GYRUS INSTRUMENTS (N/A)  Patient Location: PACU  Anesthesia Type: General  Level of Consciousness: sedated, patient cooperative and responds to stimulation  Airway & Oxygen Therapy: Patient Spontanous Breathing and Patient connected to face mask oxgen  Post-op Assessment: Report given to PACU RN and Post -op Vital signs reviewed and stable  Post vital signs: Reviewed and stable  Complications: No apparent anesthesia complications

## 2013-06-26 ENCOUNTER — Encounter (HOSPITAL_COMMUNITY): Payer: Self-pay | Admitting: Urology

## 2013-06-26 LAB — BASIC METABOLIC PANEL
BUN: 17 mg/dL (ref 6–23)
CO2: 28 mEq/L (ref 19–32)
Calcium: 8.7 mg/dL (ref 8.4–10.5)
Chloride: 103 mEq/L (ref 96–112)
Creatinine, Ser: 1 mg/dL (ref 0.50–1.35)
GFR calc Af Amer: 89 mL/min — ABNORMAL LOW (ref >90)
GFR calc non Af Amer: 76 mL/min — ABNORMAL LOW (ref >90)
Glucose, Bld: 103 mg/dL — ABNORMAL HIGH (ref 70–99)
Potassium: 4.1 mEq/L (ref 3.5–5.1)
Sodium: 137 mEq/L (ref 135–145)

## 2013-06-26 LAB — GLUCOSE, CAPILLARY
Glucose-Capillary: 129 mg/dL — ABNORMAL HIGH (ref 70–99)
Glucose-Capillary: 139 mg/dL — ABNORMAL HIGH (ref 70–99)
Glucose-Capillary: 158 mg/dL — ABNORMAL HIGH (ref 70–99)
Glucose-Capillary: 103 mg/dL — ABNORMAL HIGH (ref 70–99)

## 2013-06-26 LAB — HEMOGLOBIN AND HEMATOCRIT, BLOOD
HCT: 36.2 % — ABNORMAL LOW (ref 39.0–52.0)
Hemoglobin: 12.1 g/dL — ABNORMAL LOW (ref 13.0–17.0)

## 2013-06-26 MED ORDER — DSS 100 MG PO CAPS: 100.0000 mg | ORAL_CAPSULE | Freq: Two times a day (BID) | ORAL | Status: DC

## 2013-06-26 MED ORDER — HYDROCODONE-ACETAMINOPHEN 5-325 MG PO TABS: 1.0000 | ORAL_TABLET | ORAL | Status: DC | PRN

## 2013-06-26 MED ORDER — CITALOPRAM HYDROBROMIDE 40 MG PO TABS: 40.0000 mg | ORAL_TABLET | Freq: Every evening | ORAL | Status: DC

## 2013-06-26 MED ORDER — CIPROFLOXACIN HCL 500 MG PO TABS: 250.0000 mg | ORAL_TABLET | Freq: Two times a day (BID) | ORAL | Status: DC

## 2013-06-26 NOTE — Progress Notes (Signed)
Patient ID: Keith [name], male   DOB: Jun 14, 1946, 67 y.o.   MRN: [id number] 1 Day Post-Op  Subjective: Keith [name] is doing well 1 day out from a TURP and cystolithalopaxy.   His urine is light pink on irrigation and he has no complaints.   He has a mild acute blood loss anemia. ROS: Negative except as above  Objective: Vital signs in last 24 hours: Temp:  [97.4 F (36.3 C)-98.1 F (36.7 C)] 97.4 F (36.3 C) (10/15 0606) Pulse Rate:  [63-89] 63 (10/15 0606) Resp:  [11-18] 18 (10/15 0606) BP: (114-171)/(72-108) 137/76 mmHg (10/15 0606) SpO2:  [96 %-100 %] 98 % (10/15 0606) Weight:  [118.298 kg (260 lb 12.8 oz)] 118.298 kg (260 lb 12.8 oz) (10/14 1407)  Intake/Output from previous day: 10/14 0701 - 10/15 0700 In: 7605 [P.O.:600; I.V.:2205] Out: 11600 [Urine:11600] Intake/Output this shift: Total I/O In: 4140 [P.O.:240; I.V.:500; Other:3400] Out: 9900 [Urine:9900]  General appearance: alert and no distress  Lab Results:   Recent Labs  06/26/13 0520  HGB 12.1*  HCT 36.2*   BMET  Recent Labs  06/26/13 0520  NA 137  K 4.1  CL 103  CO2 28  GLUCOSE 103*  BUN 17  CREATININE 1.00  CALCIUM 8.7   PT/INR No results found for this basename: LABPROT, INR,  in the last 72 hours ABG No results found for this basename: PHART, PCO2, PO2, HCO3,  in the last 72 hours  Studies/Results: No results found.  Anti-infectives: Anti-infectives   Start     Dose/Rate Route Frequency Ordered Stop   06/25/13 2200  ciprofloxacin (CIPRO) tablet 500 mg     500 mg Oral 2 times daily 06/25/13 1415     06/25/13 0818  ciprofloxacin (CIPRO) IVPB 400 mg     400 mg 200 mL/hr over 60 Minutes Intravenous 60 min pre-op 06/25/13 0818 06/25/13 1045      Current Facility-Administered Medications  Medication Dose Route Frequency Provider Last Rate Last Dose  . 0.45 % NaCl with KCl 20 mEq / L infusion   Intravenous Continuous [name], MD 100 mL/hr at 06/26/13 0005    . acetaminophen (TYLENOL)  tablet 650 mg  650 mg Oral Q4H PRN [name], MD      . bisacodyl (DULCOLAX) suppository 10 mg  10 mg Rectal Daily PRN [name], MD      . ciprofloxacin (CIPRO) tablet 500 mg  500 mg Oral BID [name], MD   500 mg at 06/25/13 2119  . citalopram (CELEXA) tablet 40 mg  40 mg Oral QPM [name], MD   40 mg at 06/25/13 2119  . docusate sodium (COLACE) capsule 100 mg  100 mg Oral BID [name], MD   100 mg at 06/25/13 2119  . HYDROcodone-acetaminophen (NORCO/VICODIN) 5-325 MG per tablet 1-2 tablet  1-2 tablet Oral Q4H PRN [name], MD   2 tablet at 06/25/13 2120  . HYDROmorphone (DILAUDID) injection 0.5-1 mg  0.5-1 mg Intravenous Q2H PRN [name], MD      . hyoscyamine (LEVSIN SL) SL tablet 0.125 mg  0.125 mg Oral Q4H PRN [name], MD   0.125 mg at 06/25/13 1323  . insulin aspart (novoLOG) injection 0-15 Units  0-15 Units Subcutaneous TID WC [name], MD   2 Units at 06/25/13 1715  . metFORMIN (GLUCOPHAGE) tablet 1,000 mg  1,000 mg Oral BID WC [name], MD      . neomycin-bacitracin-polymyxin (NEOSPORIN) ointment 1 application  1 application  Topical TID PRN [name], MD   1 application at [id number]  . ondansetron (ZOFRAN) injection 4 mg  4 mg Intravenous Q4H PRN [name], MD      . opium-belladonna (B&O SUPPRETTES) suppository 1 suppository  1 suppository Rectal Q6H PRN [name], MD      . potassium gluconate tablet 595 mg  595 mg Oral Daily [name], MD      . sodium chloride irrigation 0.9 % 3,000 mL  3,000 mL Irrigation Continuous [name], MD   3,000 mL at 06/25/13 1300  . zolpidem (AMBIEN) tablet 5 mg  5 mg Oral QHS PRN [name], MD   5 mg at 06/25/13 2254    Assessment: s/p Procedure(s): CYSTOSCOPY WITH LITHOLAPAXY TRANSURETHRAL RESECTION OF THE PROSTATE WITH GYRUS INSTRUMENTS  Doing well post op with no complaints and only a minimal acute blood loss anemia.    His diabetes is well controlled. Pharmacy has recommended a reduction in the Celexa.   Plan: I  will keep him on catheter drainage one more day because of his large prostate but will d/c the CBI and IV. I discussed the Celexa dose with him and since he has been on that dose for a while, I will not change it but recommended he discuss that with his PCP post discharge.     LOS: 1 day    Kynsie Falkner J 06/26/2013

## 2013-06-26 NOTE — Op Note (Signed)
NAME:  Keith [name], Keith [name] NO.:  [id number]  MEDICAL RECORD NO.:  [id number]  LOCATION:  67                         FACILITY:  [facility]  PHYSICIAN:  Marshall Cork. Jeffie Pollock, M.D.    DATE OF BIRTH:  October 23, 1945  DATE OF PROCEDURE: DATE OF DISCHARGE:                              OPERATIVE REPORT   PROCEDURE:  Cystolitholapaxy and transurethral section of the prostate.  PREOPERATIVE DIAGNOSIS:  Benign prostatic hypertrophy with bladder outlet obstruction and bladder stones, greater than 2.5 cm.  POSTOPERATIVE DIAGNOSIS:  Benign prostatic hypertrophy with bladder outlet obstruction and bladder stones, greater than 2.5 cm.  SURGEON:  Marshall Cork. Jeffie Pollock, MD  ANESTHESIA:  General.  SPECIMENS: 1. Stone fragments. 2. Prostate chips.  BLOOD LOSS:  500 mL.  DRAINS:  A 22-French 3-way Foley catheter.  COMPLICATIONS:  None.  INDICATIONS:  Mr. Bownds is a 67 year old white male with BPH with bladder outlet obstruction with 127 g prostate with a large middle lobe.  He has 2 bladder stones measuring 1.9 and 1.2 cm for combined length of greater than 2.5 cm.  It was felt that cystolitholapaxy and TURP was indicated.  FINDINGS AND PROCEDURE:  He was given Cipro.  He was taken to the operating room where general anesthetic was induced.  He was placed in lithotomy position.  His perineum and genitalia were prepped with Betadine solution.  He was draped in usual sterile fashion.  Cystoscopy was performed using a 22-French scope and a 12-degree lens.  Examination revealed a normal urethra.  The external sphincter was intact.  The prostatic urethra was quite elongated with trilobar hyperplasia with obstruction.  Examination of the bladder revealed moderate trabeculation with some cellules.  There were too large void yellowish-brown bladder stones, most consistent with uric acid at the base the bladder, and on the right, a small diverticulum or additional small stones and fragments.  The small  stones were aspirated out.  The ureteral orifice were additionally not seen because of the large middle lobe.  However, later noted to be intact after TUR.  After initial inspection, a 1000 micron laser fiber was passed per the scope and the stone was engaged at 1 watt and 10 Hz, and the laser would drill into the stone, but not fragment as well.  At this point, I changed to this CO2 lithotrite with a 1.6 mm probe and re-engage the stone.  This device then broke the stone into manageable fragments.  These fragments were then aspirated using a combination of the cystoscope sheath and eventually the resected 28-French resectoscope sheath.  Once the stone was fully fragmented and the fragments had been removed as best as possible, the cystoscope sheath was removed and a 28-French continuous flow resectoscope sheath was inserted,  once again, some additional stone fragments were removed with this, however I was unable to retrieve all the fragments initially because they hid under the middle lobe.  Once I had removed as many fragments as possible, the resectoscope sheath was fitted with an Beatrix Fetters handle with a bipolar loop, and 12- degree lens.  Saline was used for the irrigant, and the prostate was resected, initially the middle lobe was resected  down to the bladder neck fibers.  The floor of the prostate was then resected out to alongside the verumontanum.  The right lobe of the prostate was resected from bladder neck to apex. Due to large size of the prostate, I was not able to get out to the capsular fibers beyond the area of the bladder neck in a timely fashion, but an excellent channel was created.  These chips were evacuated and the left lobe was then resected in an identical fashion.  Once this had been performed, some residual apical and anterior tissue was resected along with some residual bladder neck tissue.  The chips were removed sequentially and some additional stone  fragments were removed as well.  During this portion of the procedures, the middle lobe was removed.  Once an adequate resection had been completed and hemostasis achieved, inspection revealed intact ureteral orifices.  No residual stone fragments other than some dust adhered to the mucosa.  No active bleeding was noted.  The bladder was left full and the scope was removed, pressure on the bladder produced a good stream.  A 22-French 3- way Foley catheter was inserted with the aid of a catheter guide.  The balloon was filled with 30 mL of sterile fluid and the catheter was hand- irrigated until a clear return.  He was then placed to continuous irrigation and straight drainage.  He was taken down from lithotomy position.  His anesthetic was reversed.  He was moved to recovery room in stable condition.  There were no complications.     Marshall Cork. Jeffie Pollock, M.D.     JJW/MEDQ  D:  06/26/2013  T:  06/26/2013  Job:  [id number]

## 2013-06-27 LAB — GLUCOSE, CAPILLARY: Glucose-Capillary: 136 mg/dL — ABNORMAL HIGH (ref 70–99)

## 2013-06-27 NOTE — Anesthesia Postprocedure Evaluation (Signed)
  Anesthesia Post-op Note  Patient: Keith [name]  Procedure(s) Performed: Procedure(s) (LRB): CYSTOSCOPY WITH LITHOLAPAXY (N/A) TRANSURETHRAL RESECTION OF THE PROSTATE WITH GYRUS INSTRUMENTS (N/A)  Patient Location: PACU  Anesthesia Type: General  Level of Consciousness: awake and alert   Airway and Oxygen Therapy: Patient Spontanous Breathing  Post-op Pain: mild  Post-op Assessment: Post-op Vital signs reviewed, Patient's Cardiovascular Status Stable, Respiratory Function Stable, Patent Airway and No signs of Nausea or vomiting  Last Vitals:  Filed Vitals:   06/27/13 0648  BP: 151/76  Pulse: 56  Temp: 36.9 C  Resp: 20    Post-op Vital Signs: stable   Complications: No apparent anesthesia complications

## 2013-06-27 NOTE — Discharge Summary (Signed)
Patient ID: Keith [name] MRN: [id number] DOB/AGE: 02/26/1946 67 y.o.  Admit date: 06/25/2013 Discharge date: 06/27/2013  Primary Care Physician:  Joycelyn Man, MD  Discharge Diagnoses:   Present on Admission:  . BPH with urinary obstruction . Bladder stones  Consults:  None   Discharge Medications:   Medication List    STOP taking these medications       tamsulosin 0.4 MG Caps capsule  Commonly known as:  FLOMAX      TAKE these medications       celecoxib 200 MG capsule  Commonly known as:  CELEBREX  Take 200 mg by mouth daily.     ciprofloxacin 500 MG tablet  Commonly known as:  CIPRO  Take 0.5 tablets (250 mg total) by mouth 2 (two) times daily.     citalopram 40 MG tablet  Commonly known as:  CELEXA  Take 1 tablet (40 mg total) by mouth every evening.     DSS 100 MG Caps  Take 100 mg by mouth 2 (two) times daily.     fish oil-omega-3 fatty acids 1000 MG capsule  Take 1 g by mouth daily.     HYDROcodone-acetaminophen 5-325 MG per tablet  Commonly known as:  NORCO/VICODIN  Take 1-2 tablets by mouth every 4 (four) hours as needed.     metFORMIN 1000 MG tablet  Commonly known as:  GLUCOPHAGE  Take 1,000 mg by mouth 2 (two) times daily with a meal.     Potassium Gluconate 550 MG Tabs  Take 1 tablet by mouth daily.     tadalafil 20 MG tablet  Commonly known as:  CIALIS  Take 20 mg by mouth daily as needed. For erectil dysfunction         Significant Diagnostic Studies:  Dg Chest 2 View  06/14/2013   CLINICAL DATA:  Preop cystoscopy, TURP  EXAM: CHEST  2 VIEW  COMPARISON:  07/26/2011  FINDINGS: The heart size is mildly enlarged. Negative for heart failure. Mild elevation right hemidiaphragm unchanged. Negative for infiltrate effusion or mass.  IMPRESSION: Mild cardiac enlargement. No acute abnormality and no change from the prior study.   Electronically Signed   By: Franchot Gallo M.D.   On: 06/14/2013 10:39    Brief H and P: For complete  details please refer to admission H and P, but in brief the patient was admitted for TUR-P for symptomatic BPH.  Hospital Course:  Principal Problem:   BPH with urinary obstruction Active Problems:   Bladder stones   Day of Discharge BP 151/76  Pulse 56  Temp(Src) 98.4 F (36.9 C) (Oral)  Resp 20  Ht 6' (1.829 m)  Wt 118.298 kg (260 lb 12.8 oz)  BMI 35.36 kg/m2  SpO2 98%  Results for orders placed during the hospital encounter of 06/25/13 (from the past 24 hour(s))  GLUCOSE, CAPILLARY     Status: Abnormal   Collection Time    06/26/13 12:07 PM      Result Value Range   Glucose-Capillary 139 (*) 70 - 99 mg/dL  GLUCOSE, CAPILLARY     Status: Abnormal   Collection Time    06/26/13  4:39 PM      Result Value Range   Glucose-Capillary 158 (*) 70 - 99 mg/dL  GLUCOSE, CAPILLARY     Status: Abnormal   Collection Time    06/26/13  9:41 PM      Result Value Range   Glucose-Capillary 103 (*) 70 - 99 mg/dL  Comment 1 Documented in Chart     Comment 2 Notify RN    GLUCOSE, CAPILLARY     Status: Abnormal   Collection Time    06/27/13  7:38 AM      Result Value Range   Glucose-Capillary 136 (*) 70 - 99 mg/dL    Physical Exam: General: Alert and awake oriented x3 not in any acute distress. HEENT: anicteric sclera, pupils reactive to light and accommodation CVS: S1-S2 clear no murmur rubs or gallops Chest: clear to auscultation bilaterally, no wheezing rales or rhonchi Abdomen: soft nontender, nondistended, normal bowel sounds, no organomegaly Extremities: no cyanosis, clubbing or edema noted bilaterally Neuro: Cranial nerves II-XII intact, no focal neurological deficits  Disposition:  Home   Diet:  No restrictions  Activity:  Discussed w/ patient   Disposition and Follow-up:      Future Appointments Provider Department Dept Phone   08/13/2013 8:00 AM [facility] [place] at [place]   08/20/2013 8:30 AM Dorena Cookey, MD [place] at  [place]         TESTS THAT NEED FOLLOW-UP  Review of patholgy  DISCHARGE FOLLOW-UP Follow-up Information   Follow up with Malka So, MD. (as scheduled.   Call office for 2-3 week appt if not already set up.)    Specialty:  Urology   Contact information:   [address] [id number] [phone]       Time spent on Discharge:  10 minutes  Signed: Jorja Loa 06/27/2013, 8:27 AM

## 2013-06-29 LAB — STONE ANALYSIS: Stone Weight KSTONE: 3.387 g

## 2013-07-09 DIAGNOSIS — R82998 Other abnormal findings in urine: Secondary | ICD-10-CM | POA: Diagnosis not present

## 2013-07-18 ENCOUNTER — Other Ambulatory Visit: Payer: Self-pay

## 2013-08-13 ENCOUNTER — Encounter: Payer: Self-pay | Admitting: Family Medicine

## 2013-08-13 ENCOUNTER — Other Ambulatory Visit: Payer: Medicare Other

## 2013-08-20 ENCOUNTER — Encounter: Payer: BC Managed Care – PPO | Admitting: Family Medicine

## 2013-08-28 ENCOUNTER — Other Ambulatory Visit: Payer: Self-pay | Admitting: Family Medicine

## 2013-09-22 ENCOUNTER — Emergency Department (INDEPENDENT_AMBULATORY_CARE_PROVIDER_SITE_OTHER)
Admission: EM | Admit: 2013-09-22 | Discharge: 2013-09-22 | Disposition: A | Discharge status: Home / Self Care | Payer: Medicare Other | Source: Home / Self Care

## 2013-09-22 ENCOUNTER — Encounter (HOSPITAL_COMMUNITY): Payer: Self-pay | Admitting: Emergency Medicine

## 2013-09-22 DIAGNOSIS — L0201 Cutaneous abscess of face: Secondary | ICD-10-CM

## 2013-09-22 DIAGNOSIS — L03211 Cellulitis of face: Secondary | ICD-10-CM

## 2013-09-22 MED ORDER — MINOCYCLINE HCL 100 MG PO CAPS: 100.0000 mg | ORAL_CAPSULE | Freq: Two times a day (BID) | ORAL | Status: DC

## 2013-09-22 MED ORDER — MUPIROCIN CALCIUM 2 % EX CREA: 1.0000 "application " | TOPICAL_CREAM | Freq: Three times a day (TID) | CUTANEOUS | Status: DC

## 2013-09-22 NOTE — Discharge Instructions (Signed)
Warm compress twice a day when you take the antibiotic, take all of medicine, return on mon for recheck with dr Juventino Slovak.

## 2013-09-22 NOTE — ED Notes (Signed)
Reports bumping right temple area on cabinet door 2 days ago - had small abrasion; Woke up yest with swelling to right eye.  Denies any vision changes.

## 2013-09-22 NOTE — ED Provider Notes (Signed)
CSN: [id number]     Arrival date & time 09/22/13  1729 History   None    Chief Complaint  Patient presents with  . Facial Swelling   (Consider location/radiation/quality/duration/timing/severity/associated sxs/prior Treatment) Patient is a 68 y.o. male presenting with rash.  Rash Location:  Face Facial rash location:  L eyelid, R eyelid, forehead and R cheek Quality: draining, redness and swelling   Severity:  Moderate Onset quality:  Gradual Duration:  2 days Progression:  Worsening Chronicity:  New Context comment:  Right forehead abrasion from scrape on cabinet Associated symptoms: periorbital edema   Associated symptoms: no fever     Past Medical History  Diagnosis Date  . Hypertension   . Benign prostatic hypertrophy     takes Flomax daily  . ED (erectile dysfunction)   . Complication of anesthesia     hard to wake up  . Hyperlipidemia   . Sleep apnea     sleep study done 20+yrs ago  . Arthritis     bil knees  . Anxiety     takes Celexa  . Spinal headache   . PONV (postoperative nausea and vomiting)   . Diabetes mellitus without complication   . BPH with urinary obstruction 06/25/2013    127cc prostate with large middle lobe and bladder stones.   . Bladder stones 06/25/2013    1.9cm bladder stone.   Past Surgical History  Procedure Laterality Date  . Colonoscopy    . Knee arthroscopy  09/07/2010    left knee  . Cataract extraction w/ intraocular lens  implant, bilateral  03/13/2003  . Retinal detachment repair w/ scleral buckle le  10/14/2002    Dr Zadie Rhine  . Knee arthroscopy  05/27/2000    left knee  . Tonsillectomy      age 43  . Vasectomy  1982  . Total knee arthroplasty  08/01/2011    Procedure: TOTAL KNEE ARTHROPLASTY;  Surgeon: Lorn Junes, MD;  Location: [facility];  Service: Orthopedics;  Laterality: Right;  TOTAL KNEE ARTHROPLASTY  RIGHT SIDE  . Cystoscopy with litholapaxy N/A 06/25/2013    Procedure: CYSTOSCOPY WITH LITHOLAPAXY;  Surgeon:  [name], MD;  Location: [facility];  Service: Urology;  Laterality: N/A;  . Transurethral resection of prostate N/A 06/25/2013    Procedure: TRANSURETHRAL RESECTION OF THE PROSTATE WITH GYRUS INSTRUMENTS;  Surgeon: [name], MD;  Location: [facility];  Service: Urology;  Laterality: N/A;   Family History  Problem Relation Age of Onset  . Arthritis Other   . Cancer Other     breast, skin  . Coronary artery disease Other   . Heart disease Other   . Hypertension Mother   . Heart failure Father   . Anesthesia problems Neg Hx   . Hypotension Neg Hx   . Malignant hyperthermia Neg Hx   . Pseudochol deficiency Neg Hx    History  Substance Use Topics  . Smoking status: Never Smoker   . Smokeless tobacco: Not on file  . Alcohol Use: No    Review of Systems  Constitutional: Negative for fever.  Skin: Positive for rash and wound.    Allergies  Penicillins  Home Medications   Current Outpatient Rx  Name  Route  Sig  Dispense  Refill  . metFORMIN (GLUCOPHAGE) 1000 MG tablet   Oral   Take 1,000 mg by mouth 2 (two) times daily with a meal.         . celecoxib (CELEBREX) 200 MG capsule  Oral   Take 200 mg by mouth daily.           . ciprofloxacin (CIPRO) 500 MG tablet   Oral   Take 0.5 tablets (250 mg total) by mouth 2 (two) times daily.   6 tablet   0   . citalopram (CELEXA) 40 MG tablet   Oral   Take 1 tablet (40 mg total) by mouth every evening.   30 tablet   0     Please discuss reducing this dose to 20mg  with you ...   . citalopram (CELEXA) 40 MG tablet      TAKE ONE TABLET BY MOUTH ONCE DAILY   100 tablet   0   . docusate sodium 100 MG CAPS   Oral   Take 100 mg by mouth 2 (two) times daily.   60 capsule   1   . fish oil-omega-3 fatty acids 1000 MG capsule   Oral   Take 1 g by mouth daily.         Marland Kitchen HYDROcodone-acetaminophen (NORCO/VICODIN) 5-325 MG per tablet   Oral   Take 1-2 tablets by mouth every 4 (four) hours as needed.   15 tablet   0    . minocycline (MINOCIN,DYNACIN) 100 MG capsule   Oral   Take 1 capsule (100 mg total) by mouth 2 (two) times daily.   14 capsule   0   . mupirocin cream (BACTROBAN) 2 %   Topical   Apply 1 application topically 3 (three) times daily.   30 g   1   . Potassium Gluconate 550 MG TABS   Oral   Take 1 tablet by mouth daily.          . tadalafil (CIALIS) 20 MG tablet   Oral   Take 20 mg by mouth daily as needed. For erectil dysfunction          BP 148/76  Pulse 68  Temp(Src) 97.9 F (36.6 C) (Oral)  Resp 18  SpO2 100% Physical Exam  Nursing note and vitals reviewed. Constitutional: He is oriented to person, place, and time. He appears well-developed and well-nourished.  HENT:  Head: Normocephalic.  Right Ear: External ear normal.  Mouth/Throat: Oropharynx is clear and moist.  Abrasion to right temporal face with golden exudate and erythema and periorbital sts. No ocular sx.  Eyes: Conjunctivae and EOM are normal. Pupils are equal, round, and reactive to light.  Lymphadenopathy:    He has no cervical adenopathy.  Neurological: He is alert and oriented to person, place, and time.    ED Course  Procedures (including critical care time) Labs Review Labs Reviewed - No data to display Imaging Review No results found.  EKG Interpretation    Date/Time:    Ventricular Rate:    PR Interval:    QRS Duration:   QT Interval:    QTC Calculation:   R Axis:     Text Interpretation:              MDM      Billy Fischer, MD 09/22/13 [phone]

## 2013-09-23 DIAGNOSIS — L0291 Cutaneous abscess, unspecified: Secondary | ICD-10-CM | POA: Diagnosis not present

## 2013-09-23 DIAGNOSIS — Z6836 Body mass index (BMI) 36.0-36.9, adult: Secondary | ICD-10-CM | POA: Diagnosis not present

## 2013-09-23 DIAGNOSIS — L0390 Cellulitis, unspecified: Secondary | ICD-10-CM | POA: Diagnosis not present

## 2013-10-03 DIAGNOSIS — M25559 Pain in unspecified hip: Secondary | ICD-10-CM | POA: Diagnosis not present

## 2013-10-03 DIAGNOSIS — M25569 Pain in unspecified knee: Secondary | ICD-10-CM | POA: Diagnosis not present

## 2013-10-09 DIAGNOSIS — M7650 Patellar tendinitis, unspecified knee: Secondary | ICD-10-CM | POA: Diagnosis not present

## 2013-10-09 DIAGNOSIS — Z96659 Presence of unspecified artificial knee joint: Secondary | ICD-10-CM | POA: Diagnosis not present

## 2013-10-09 DIAGNOSIS — M6281 Muscle weakness (generalized): Secondary | ICD-10-CM | POA: Diagnosis not present

## 2013-10-09 DIAGNOSIS — M25569 Pain in unspecified knee: Secondary | ICD-10-CM | POA: Diagnosis not present

## 2013-10-17 DIAGNOSIS — E119 Type 2 diabetes mellitus without complications: Secondary | ICD-10-CM | POA: Diagnosis not present

## 2013-10-17 DIAGNOSIS — E785 Hyperlipidemia, unspecified: Secondary | ICD-10-CM | POA: Diagnosis not present

## 2013-10-17 DIAGNOSIS — Z125 Encounter for screening for malignant neoplasm of prostate: Secondary | ICD-10-CM | POA: Diagnosis not present

## 2013-10-23 DIAGNOSIS — M25569 Pain in unspecified knee: Secondary | ICD-10-CM | POA: Diagnosis not present

## 2013-10-23 DIAGNOSIS — M7650 Patellar tendinitis, unspecified knee: Secondary | ICD-10-CM | POA: Diagnosis not present

## 2013-10-24 DIAGNOSIS — I10 Essential (primary) hypertension: Secondary | ICD-10-CM | POA: Diagnosis not present

## 2013-10-24 DIAGNOSIS — Z1331 Encounter for screening for depression: Secondary | ICD-10-CM | POA: Diagnosis not present

## 2013-10-24 DIAGNOSIS — M7650 Patellar tendinitis, unspecified knee: Secondary | ICD-10-CM | POA: Diagnosis not present

## 2013-10-24 DIAGNOSIS — E1129 Type 2 diabetes mellitus with other diabetic kidney complication: Secondary | ICD-10-CM | POA: Diagnosis not present

## 2013-10-24 DIAGNOSIS — Z6836 Body mass index (BMI) 36.0-36.9, adult: Secondary | ICD-10-CM | POA: Diagnosis not present

## 2013-10-24 DIAGNOSIS — Z0000 Encounter for general adult medical examination without abnormal findings: Secondary | ICD-10-CM | POA: Diagnosis not present

## 2013-10-24 DIAGNOSIS — M25569 Pain in unspecified knee: Secondary | ICD-10-CM | POA: Diagnosis not present

## 2013-10-24 DIAGNOSIS — Z79899 Other long term (current) drug therapy: Secondary | ICD-10-CM | POA: Diagnosis not present

## 2013-10-24 DIAGNOSIS — G4733 Obstructive sleep apnea (adult) (pediatric): Secondary | ICD-10-CM | POA: Diagnosis not present

## 2013-10-24 DIAGNOSIS — E785 Hyperlipidemia, unspecified: Secondary | ICD-10-CM | POA: Diagnosis not present

## 2013-10-31 DIAGNOSIS — M7650 Patellar tendinitis, unspecified knee: Secondary | ICD-10-CM | POA: Diagnosis not present

## 2013-10-31 DIAGNOSIS — M25569 Pain in unspecified knee: Secondary | ICD-10-CM | POA: Diagnosis not present

## 2013-11-04 ENCOUNTER — Other Ambulatory Visit: Payer: Self-pay | Admitting: Dermatology

## 2013-11-04 DIAGNOSIS — L719 Rosacea, unspecified: Secondary | ICD-10-CM | POA: Diagnosis not present

## 2013-11-04 DIAGNOSIS — D485 Neoplasm of uncertain behavior of skin: Secondary | ICD-10-CM | POA: Diagnosis not present

## 2013-11-04 DIAGNOSIS — L739 Follicular disorder, unspecified: Secondary | ICD-10-CM | POA: Diagnosis not present

## 2013-11-04 DIAGNOSIS — L821 Other seborrheic keratosis: Secondary | ICD-10-CM | POA: Diagnosis not present

## 2013-11-04 DIAGNOSIS — C4441 Basal cell carcinoma of skin of scalp and neck: Secondary | ICD-10-CM | POA: Diagnosis not present

## 2013-11-04 DIAGNOSIS — Z1212 Encounter for screening for malignant neoplasm of rectum: Secondary | ICD-10-CM | POA: Diagnosis not present

## 2013-11-04 DIAGNOSIS — L570 Actinic keratosis: Secondary | ICD-10-CM | POA: Diagnosis not present

## 2013-11-04 DIAGNOSIS — C44319 Basal cell carcinoma of skin of other parts of face: Secondary | ICD-10-CM | POA: Diagnosis not present

## 2013-11-04 DIAGNOSIS — L259 Unspecified contact dermatitis, unspecified cause: Secondary | ICD-10-CM | POA: Diagnosis not present

## 2013-11-05 DIAGNOSIS — M25569 Pain in unspecified knee: Secondary | ICD-10-CM | POA: Diagnosis not present

## 2013-11-05 DIAGNOSIS — M7650 Patellar tendinitis, unspecified knee: Secondary | ICD-10-CM | POA: Diagnosis not present

## 2013-11-11 DIAGNOSIS — R21 Rash and other nonspecific skin eruption: Secondary | ICD-10-CM | POA: Diagnosis not present

## 2013-11-11 DIAGNOSIS — L259 Unspecified contact dermatitis, unspecified cause: Secondary | ICD-10-CM | POA: Diagnosis not present

## 2013-11-13 DIAGNOSIS — N139 Obstructive and reflux uropathy, unspecified: Secondary | ICD-10-CM | POA: Diagnosis not present

## 2013-11-13 DIAGNOSIS — N401 Enlarged prostate with lower urinary tract symptoms: Secondary | ICD-10-CM | POA: Diagnosis not present

## 2013-11-13 DIAGNOSIS — N138 Other obstructive and reflux uropathy: Secondary | ICD-10-CM | POA: Diagnosis not present

## 2013-11-13 DIAGNOSIS — N529 Male erectile dysfunction, unspecified: Secondary | ICD-10-CM | POA: Diagnosis not present

## 2013-11-14 DIAGNOSIS — M25569 Pain in unspecified knee: Secondary | ICD-10-CM | POA: Diagnosis not present

## 2013-11-14 DIAGNOSIS — M7650 Patellar tendinitis, unspecified knee: Secondary | ICD-10-CM | POA: Diagnosis not present

## 2013-11-22 DIAGNOSIS — M25569 Pain in unspecified knee: Secondary | ICD-10-CM | POA: Diagnosis not present

## 2013-11-22 DIAGNOSIS — M7650 Patellar tendinitis, unspecified knee: Secondary | ICD-10-CM | POA: Diagnosis not present

## 2013-11-26 DIAGNOSIS — M25569 Pain in unspecified knee: Secondary | ICD-10-CM | POA: Diagnosis not present

## 2013-11-27 ENCOUNTER — Other Ambulatory Visit: Payer: Self-pay | Admitting: Dermatology

## 2013-11-27 DIAGNOSIS — Z85828 Personal history of other malignant neoplasm of skin: Secondary | ICD-10-CM | POA: Diagnosis not present

## 2013-11-27 DIAGNOSIS — C44319 Basal cell carcinoma of skin of other parts of face: Secondary | ICD-10-CM | POA: Diagnosis not present

## 2013-11-28 DIAGNOSIS — M25569 Pain in unspecified knee: Secondary | ICD-10-CM | POA: Diagnosis not present

## 2013-11-28 DIAGNOSIS — M6281 Muscle weakness (generalized): Secondary | ICD-10-CM | POA: Diagnosis not present

## 2013-11-28 DIAGNOSIS — M7650 Patellar tendinitis, unspecified knee: Secondary | ICD-10-CM | POA: Diagnosis not present

## 2014-01-16 ENCOUNTER — Other Ambulatory Visit: Payer: Self-pay | Admitting: Family Medicine

## 2014-01-31 DIAGNOSIS — N200 Calculus of kidney: Secondary | ICD-10-CM | POA: Diagnosis not present

## 2014-01-31 DIAGNOSIS — R310 Gross hematuria: Secondary | ICD-10-CM | POA: Diagnosis not present

## 2014-01-31 DIAGNOSIS — R109 Unspecified abdominal pain: Secondary | ICD-10-CM | POA: Diagnosis not present

## 2014-02-13 DIAGNOSIS — N200 Calculus of kidney: Secondary | ICD-10-CM | POA: Diagnosis not present

## 2014-03-06 DIAGNOSIS — R0789 Other chest pain: Secondary | ICD-10-CM | POA: Diagnosis not present

## 2014-03-06 DIAGNOSIS — I10 Essential (primary) hypertension: Secondary | ICD-10-CM | POA: Diagnosis not present

## 2014-03-06 DIAGNOSIS — Z6837 Body mass index (BMI) 37.0-37.9, adult: Secondary | ICD-10-CM | POA: Diagnosis not present

## 2014-03-06 DIAGNOSIS — E1129 Type 2 diabetes mellitus with other diabetic kidney complication: Secondary | ICD-10-CM | POA: Diagnosis not present

## 2014-03-06 DIAGNOSIS — G4733 Obstructive sleep apnea (adult) (pediatric): Secondary | ICD-10-CM | POA: Diagnosis not present

## 2014-03-13 ENCOUNTER — Ambulatory Visit (INDEPENDENT_AMBULATORY_CARE_PROVIDER_SITE_OTHER): Payer: Medicare Other | Admitting: Neurology

## 2014-03-13 ENCOUNTER — Encounter: Payer: Self-pay | Admitting: Neurology

## 2014-03-13 VITALS — BP 130/77 | HR 70 | Temp 97.7°F | Ht 72.0 in | Wt 264.0 lb

## 2014-03-13 DIAGNOSIS — E669 Obesity, unspecified: Secondary | ICD-10-CM | POA: Diagnosis not present

## 2014-03-13 DIAGNOSIS — R351 Nocturia: Secondary | ICD-10-CM

## 2014-03-13 DIAGNOSIS — G4733 Obstructive sleep apnea (adult) (pediatric): Secondary | ICD-10-CM | POA: Diagnosis not present

## 2014-03-13 NOTE — Patient Instructions (Addendum)
Based on your symptoms and your exam I believe you are at risk for obstructive sleep apnea or OSA, and I think we should proceed with a sleep study to determine whether you do or do not have OSA and how severe it is. If you have more than mild OSA, I want you to consider treatment with CPAP. Please remember, the risks and ramifications of moderate to severe obstructive sleep apnea or OSA are: Cardiovascular disease, including congestive heart failure, stroke, difficult to control hypertension, arrhythmias, and even type 2 diabetes has been linked to untreated OSA. Sleep apnea causes disruption of sleep and sleep deprivation in most cases, which, in turn, can cause recurrent headaches, problems with memory, mood, concentration, focus, and vigilance. Most people with untreated sleep apnea report excessive daytime sleepiness, which can affect their ability to drive. Please do not drive if you feel sleepy.  I will see you back after your sleep study to go over the test results and where to go from there. We will call you after your sleep study and to set up an appointment at the time.   Based on your sleep test results I will be able to prescribe a new CPAP machine and mask for you.

## 2014-03-13 NOTE — Progress Notes (Signed)
Subjective:    Patient ID: Keith [name]. is a 68 y.o. male.  HPI    Star Age, MD, PhD [facility] [address] [place], [place] [address]   Dear Dr. Sharlett Iles,  I saw your patient, Keith [name], upon your kind request in my neurologic clinic today for initial consultation of his obstructive sleep apnea, for reevaluation and management. The patient is unaccompanied today. As you know, [name] is a very pleasant 68 year old right-handed gentleman with an underlying medical history of obesity, BPH, bladder stones, type 2 diabetes, degenerative joint disease and chronic back pain, hyperlipidemia, chronic rhinitis, depression, and migraine headaches, who was diagnosed with obstructive sleep apnea over 25 years ago. He has been using CPAP off and on all these years with improvement in snoring and sleep reported. He purchased his own machine and brought it today, which is a ResMed S6, without a humidifier and he uses a Radio producer FFM, as he is a mouth breather. He has never had a reevaluation of his sleep apnea or a new machine. He has been consistent with CPAP in the last year, but not consistent in the 10 years before. I do not have previous sleep study results available for review. He reports discomfort with the mask and has mouth dryness. He averages 4 hours with the CPAP, but then takes it off. I do not know the pressure on the machine.   His typical bedtime is reported to be around 10 PM and usual wake time is around 7 AM. Sleep onset typically occurs within minutes. He reports feeling marginally rested upon awakening. He wakes up on an average 3 times in the middle of the night and has to go to the bathroom 3 times on a typical night. He admits to rare morning headaches.  He reports excessive daytime somnolence (EDS) and His Epworth Sleepiness Score (ESS) is 11/24 today. He has not fallen asleep while driving. The patient has not  been taking a scheduled nap, as his wife does not like for him to nap, because he tends to sleep for 3-4 hours. The patient has not noted any RLS symptoms and is not known to kick while asleep or before falling asleep. He dreams rarely.    He denies cataplexy, sleep paralysis, hypnagogic or hypnopompic hallucinations, or sleep attacks. He does not report any vivid dreams, nightmares, dream enactments, or parasomnias, such as sleep talking or sleep walking. The patient has not had a sleep study in over 25 years.  He consumes no daily caffeine.   His bedroom is usually dark and cool. There is a TV in the bedroom and usually it is not on at night. There is a small dog in the bed, and he does disturb his sleep as he cannot stretch his feet completely.   His Past Medical History Is Significant For: Past Medical History  Diagnosis Date  . Hypertension   . Benign prostatic hypertrophy     takes Flomax daily  . ED (erectile dysfunction)   . Complication of anesthesia     hard to wake up  . Hyperlipidemia   . Sleep apnea     sleep study done 20+yrs ago  . Arthritis     bil knees  . Anxiety     takes Celexa  . Spinal headache   . PONV (postoperative nausea and vomiting)   . Diabetes mellitus without complication   . BPH with urinary obstruction 06/25/2013  127cc prostate with large middle lobe and bladder stones.   . Bladder stones 06/25/2013    1.9cm bladder stone.    His Past Surgical History Is Significant For: Past Surgical History  Procedure Laterality Date  . Colonoscopy    . Knee arthroscopy  09/07/2010    left knee  . Cataract extraction w/ intraocular lens  implant, bilateral  03/13/2003  . Retinal detachment repair w/ scleral buckle le  10/14/2002    Dr Zadie Rhine  . Knee arthroscopy  05/27/2000    left knee  . Tonsillectomy      age 48  . Vasectomy  1982  . Total knee arthroplasty  08/01/2011    Procedure: TOTAL KNEE ARTHROPLASTY;  Surgeon: Lorn Junes, MD;  Location:  [facility];  Service: Orthopedics;  Laterality: Right;  TOTAL KNEE ARTHROPLASTY  RIGHT SIDE  . Cystoscopy with litholapaxy N/A 06/25/2013    Procedure: CYSTOSCOPY WITH LITHOLAPAXY;  Surgeon: [name], MD;  Location: [facility];  Service: Urology;  Laterality: N/A;  . Transurethral resection of prostate N/A 06/25/2013    Procedure: TRANSURETHRAL RESECTION OF THE PROSTATE WITH GYRUS INSTRUMENTS;  Surgeon: [name], MD;  Location: [facility];  Service: Urology;  Laterality: N/A;    His Family History Is Significant For: Family History  Problem Relation Age of Onset  . Arthritis Other   . Cancer Other     breast, skin  . Coronary artery disease Other   . Heart disease Other   . Hypertension Mother   . Heart failure Father   . Anesthesia problems Neg Hx   . Hypotension Neg Hx   . Malignant hyperthermia Neg Hx   . Pseudochol deficiency Neg Hx     His Social History Is Significant For: History   Social History  . Marital Status: Married    Spouse Name: N/A    Number of Children: N/A  . Years of Education: N/A   Social History Main Topics  . Smoking status: Never Smoker   . Smokeless tobacco: None  . Alcohol Use: No  . Drug Use: No  . Sexual Activity: None   Other Topics Concern  . None   Social History Narrative  . None    His Allergies Are:  Allergies  Allergen Reactions  . Penicillins Anaphylaxis    "eyes swell shut"  :   His Current Medications Are:  Outpatient Encounter Prescriptions as of 03/13/2014  Medication Sig  . celecoxib (CELEBREX) 200 MG capsule Take 200 mg by mouth daily.    . ciprofloxacin (CIPRO) 500 MG tablet Take 0.5 tablets (250 mg total) by mouth 2 (two) times daily.  . citalopram (CELEXA) 40 MG tablet TAKE ONE TABLET EACH DAY  . docusate sodium 100 MG CAPS Take 100 mg by mouth 2 (two) times daily.  . fish oil-omega-3 fatty acids 1000 MG capsule Take 1 g by mouth daily.  . metFORMIN (GLUCOPHAGE) 1000 MG tablet Take 1,000 mg by mouth 2 (two) times daily  with a meal.  . mupirocin cream (BACTROBAN) 2 % Apply 1 application topically 3 (three) times daily.  . Potassium Gluconate 550 MG TABS Take 1 tablet by mouth daily.   . tadalafil (CIALIS) 20 MG tablet Take 20 mg by mouth daily as needed. For erectil dysfunction  . [DISCONTINUED] citalopram (CELEXA) 40 MG tablet Take 1 tablet (40 mg total) by mouth every evening.  . [DISCONTINUED] HYDROcodone-acetaminophen (NORCO/VICODIN) 5-325 MG per tablet Take 1-2 tablets by mouth every 4 (four) hours as  needed.  . [DISCONTINUED] minocycline (MINOCIN,DYNACIN) 100 MG capsule Take 1 capsule (100 mg total) by mouth 2 (two) times daily.  :  Review of Systems:  Out of a complete 14 point review of systems, all are reviewed and negative with the exception of these symptoms as listed below:  Review of Systems  Constitutional: Positive for fatigue.  HENT: Negative.   Eyes: Negative.   Respiratory: Positive for cough and shortness of breath.   Cardiovascular: Negative.   Gastrointestinal: Negative.   Endocrine: Negative.   Genitourinary:       Impotence  Musculoskeletal: Positive for arthralgias and myalgias.  Skin: Negative.   Allergic/Immunologic: Negative.   Neurological: Positive for dizziness.  Hematological: Negative.   Psychiatric/Behavioral: Positive for sleep disturbance (snoring, eds, shift work).    Objective:  Neurologic Exam  Physical Exam Physical Examination:   Filed Vitals:   03/13/14 1048  BP: 130/77  Pulse: 70  Temp: 97.7 F (36.5 C)    General Examination: The patient is a very pleasant 68 y.o. male in no acute distress. He appears well-developed and well-nourished and well groomed.   HEENT: Normocephalic, atraumatic, pupils are equal, round and reactive to light and accommodation. Funduscopic exam is normal with sharp disc margins noted. Extraocular tracking is good without limitation to gaze excursion or nystagmus noted. Normal smooth pursuit is noted. Hearing is grossly  intact. Tympanic membranes are clear bilaterally. Face is symmetric with normal facial animation and normal facial sensation. Speech is clear with no dysarthria noted. There is no hypophonia. There is no lip, neck/head, jaw or voice tremor. Neck is supple with full range of passive and active motion. There are no carotid bruits on auscultation. Oropharynx exam reveals: moderate mouth dryness, adequate dental hygiene and moderate airway crowding, due to redundant soft palate. Mallampati is class II. Tongue protrudes centrally and palate elevates symmetrically. Tonsils are absent. Neck size is 18 3/8 inches. He has a Mild to moderate overbite. Nasal inspection reveals no significant nasal mucosal bogginess or redness and no septal deviation.   Chest: Clear to auscultation without wheezing, rhonchi or crackles noted.  Heart: S1+S2+0, regular and normal without murmurs, rubs or gallops noted.   Abdomen: Soft, non-tender and non-distended with normal bowel sounds appreciated on auscultation.  Extremities: There is trace pitting edema in the distal lower extremities bilaterally. Pedal pulses are intact.  Skin: Warm and dry without trophic changes noted. There are no varicose veins except mild on the L.  Musculoskeletal: exam reveals no obvious joint deformities, tenderness or joint swelling or erythema. He has R knee pain and had a knee replacement on the R.   Neurologically:  Mental status: The patient is awake, alert and oriented in all 4 spheres. His immediate and remote memory, attention, language skills and fund of knowledge are appropriate. There is no evidence of aphasia, agnosia, apraxia or anomia. Speech is clear with normal prosody and enunciation. Thought process is linear. Mood is normal and affect is normal.  Cranial nerves II - XII are as described above under HEENT exam. In addition: shoulder shrug is normal with equal shoulder height noted. Motor exam: Normal bulk, strength and tone is  noted. There is no drift, tremor or rebound. Romberg is negative. Reflexes are 2+ throughout. Babinski: Toes are flexor bilaterally. Fine motor skills and coordination: intact with normal finger taps, normal hand movements, normal rapid alternating patting, normal foot taps and normal foot agility.  Cerebellar testing: No dysmetria or intention tremor on finger to nose  testing. Heel to shin is unremarkable bilaterally, with the exception of mild difficulty on the right side due to his right knee pain. There is no truncal or gait ataxia.  Sensory exam: intact to light touch, pinprick, vibration, temperature sense in the upper and lower extremities.  Gait, station and balance: He stands easily. No veering to one side is noted. No leaning to one side is noted. Posture is age-appropriate and stance is narrow based. Gait shows normal stride length and normal pace. No problems turning are noted. He turns en bloc. Tandem walk is mildly difficult for him. Intact toe and heel stance is noted.               Assessment and Plan:  In summary, Garin Koperski. is a very pleasant 68 y.o.-year old male with an underlying medical history of obesity, BPH, bladder stones, type 2 diabetes, degenerative joint disease and chronic back pain, hyperlipidemia, chronic rhinitis, depression, and migraine headaches, who was diagnosed with obstructive sleep apnea over 25 years ago. He presents for reevaluation and optimization of his treatment as he still has trouble with compliance. He feels uncomfortable and has mouth dryness and is barely able to use his CPAP for 4 hours at a time. He does have been outdated machine and would benefit from an updated machine with humidifier as well as hopefully a better fitting full face mask. He does report mouth breathing. He also reports significant nocturia. We talked about improving his sleep hygiene a little bit. Sleeping with a pet in the bed especially if the pet does not allow for a good rest  is counterproductive. I had a long chat with the patient about my findings and the diagnosis of OSA, its prognosis and treatment options. We talked about medical treatments, surgical interventions and non-pharmacological approaches. I explained in particular the risks and ramifications of untreated moderate to severe OSA, especially with respect to developing cardiovascular disease down the Road, including congestive heart failure, difficult to treat hypertension, cardiac arrhythmias, or stroke. Even type 2 diabetes has, in part, been linked to untreated OSA. Symptoms of untreated OSA include daytime sleepiness, memory problems, mood irritability and mood disorder such as depression and anxiety, lack of energy, as well as recurrent headaches, especially morning headaches. We talked about trying to maintain a healthy lifestyle in general, as well as the importance of weight control. I encouraged the patient to eat healthy, exercise daily and keep well hydrated, to keep a scheduled bedtime and wake time routine, to not skip any meals and eat healthy snacks in between meals. I advised the patient not to drive when feeling sleepy. I recommended the following at this time: sleep study with potential positive airway pressure titration.  I explained the sleep test procedure to the patient and also outlined possible surgical and non-surgical treatment options of OSA, including the use of a custom-made dental device (which would require a referral to a specialist dentist or oral surgeon), upper airway surgical options, such as pillar implants, radiofrequency surgery, tongue base surgery, and UPPP (which would involve a referral to an ENT surgeon). Rarely, jaw surgery such as mandibular advancement may be considered.  I also explained the CPAP treatment option to the patient, who indicated that he would be willing to continue CPAP if the need arises. I explained the importance of being compliant with PAP treatment, not  only for insurance purposes but primarily to improve His symptoms, and for the patient's long term health benefit, including to  reduce His cardiovascular risks. I answered all his questions today and the patient was in agreement. I would like to see him back after the sleep study is completed and encouraged him to call with any interim questions, concerns, problems or updates.   Thank you very much for allowing me to participate in the care of this nice patient. If I can be of any further assistance to you please do not hesitate to call me at [phone].  Sincerely,   Star Age, MD, PhD

## 2014-03-17 DIAGNOSIS — M25569 Pain in unspecified knee: Secondary | ICD-10-CM | POA: Diagnosis not present

## 2014-03-18 DIAGNOSIS — N200 Calculus of kidney: Secondary | ICD-10-CM | POA: Diagnosis not present

## 2014-03-20 DIAGNOSIS — M25569 Pain in unspecified knee: Secondary | ICD-10-CM | POA: Diagnosis not present

## 2014-03-21 ENCOUNTER — Telehealth: Payer: Self-pay | Admitting: Family Medicine

## 2014-03-21 NOTE — Telephone Encounter (Signed)
lmom for pt to cb. Pt needs diabetic bundle a1c,ldl and also pt needs appt with md

## 2014-03-26 ENCOUNTER — Encounter: Payer: Self-pay | Admitting: Family Medicine

## 2014-04-09 DIAGNOSIS — N200 Calculus of kidney: Secondary | ICD-10-CM | POA: Diagnosis not present

## 2014-04-14 DIAGNOSIS — R0789 Other chest pain: Secondary | ICD-10-CM | POA: Diagnosis not present

## 2014-04-14 DIAGNOSIS — E119 Type 2 diabetes mellitus without complications: Secondary | ICD-10-CM | POA: Diagnosis not present

## 2014-04-14 DIAGNOSIS — R0989 Other specified symptoms and signs involving the circulatory and respiratory systems: Secondary | ICD-10-CM | POA: Diagnosis not present

## 2014-04-14 DIAGNOSIS — E782 Mixed hyperlipidemia: Secondary | ICD-10-CM | POA: Diagnosis not present

## 2014-04-14 DIAGNOSIS — R0609 Other forms of dyspnea: Secondary | ICD-10-CM | POA: Diagnosis not present

## 2014-04-18 DIAGNOSIS — R0609 Other forms of dyspnea: Secondary | ICD-10-CM | POA: Diagnosis not present

## 2014-04-18 DIAGNOSIS — R0989 Other specified symptoms and signs involving the circulatory and respiratory systems: Secondary | ICD-10-CM | POA: Diagnosis not present

## 2014-04-21 DIAGNOSIS — I10 Essential (primary) hypertension: Secondary | ICD-10-CM | POA: Diagnosis not present

## 2014-04-21 DIAGNOSIS — R079 Chest pain, unspecified: Secondary | ICD-10-CM | POA: Diagnosis not present

## 2014-04-21 DIAGNOSIS — R0602 Shortness of breath: Secondary | ICD-10-CM | POA: Diagnosis not present

## 2014-04-21 DIAGNOSIS — R9431 Abnormal electrocardiogram [ECG] [EKG]: Secondary | ICD-10-CM | POA: Diagnosis not present

## 2014-04-25 ENCOUNTER — Encounter: Payer: Self-pay | Admitting: Neurology

## 2014-04-29 ENCOUNTER — Ambulatory Visit (INDEPENDENT_AMBULATORY_CARE_PROVIDER_SITE_OTHER): Payer: Medicare Other

## 2014-04-29 DIAGNOSIS — G4710 Hypersomnia, unspecified: Secondary | ICD-10-CM

## 2014-04-29 DIAGNOSIS — G4731 Primary central sleep apnea: Secondary | ICD-10-CM | POA: Diagnosis not present

## 2014-04-29 DIAGNOSIS — R351 Nocturia: Secondary | ICD-10-CM

## 2014-04-29 DIAGNOSIS — E669 Obesity, unspecified: Secondary | ICD-10-CM

## 2014-04-29 DIAGNOSIS — G4761 Periodic limb movement disorder: Secondary | ICD-10-CM | POA: Diagnosis not present

## 2014-04-29 DIAGNOSIS — G4733 Obstructive sleep apnea (adult) (pediatric): Secondary | ICD-10-CM

## 2014-05-02 ENCOUNTER — Telehealth: Payer: Self-pay | Admitting: Neurology

## 2014-05-02 DIAGNOSIS — G4733 Obstructive sleep apnea (adult) (pediatric): Secondary | ICD-10-CM

## 2014-05-02 DIAGNOSIS — G4731 Primary central sleep apnea: Secondary | ICD-10-CM

## 2014-05-02 DIAGNOSIS — R9431 Abnormal electrocardiogram [ECG] [EKG]: Secondary | ICD-10-CM

## 2014-05-02 NOTE — Telephone Encounter (Signed)
Please advise patient had his sleep study showed significant sleep apnea. He did not do well with CPAP we tried him on a different treatment called BiPAP And while he did better on BiPAP is supposed to CPAP, we did not fully controlled his sleep apnea with that. I would like to get him started on BiPAP therapy at home but at the same time also request that he come back for a full night BiPAP titration study so we can optimize this treatment. Furthermore, he was noted to have significant changes in his EKG with extra heartbeats and a run of tachycardia. I would like for him to see a cardiologist for this. I will place a referral. Please print referral requested and hand to the nursing staff so they can process.  Triage pool: please rush cardiology referral!

## 2014-05-05 NOTE — Telephone Encounter (Signed)
Please schedule. Patient aware of results and expecting a call to be scheduled. Per Athar: I would like to get him started on BiPAP therapy at home but at the same time also request that he come back for a full night BiPAP titration study so we can optimize therapy.

## 2014-05-12 ENCOUNTER — Telehealth: Payer: Self-pay | Admitting: *Deleted

## 2014-05-12 NOTE — Telephone Encounter (Signed)
Copy of patient sleep study is at front desk.

## 2014-05-13 DIAGNOSIS — R0602 Shortness of breath: Secondary | ICD-10-CM | POA: Diagnosis not present

## 2014-05-13 DIAGNOSIS — I10 Essential (primary) hypertension: Secondary | ICD-10-CM | POA: Diagnosis not present

## 2014-05-13 DIAGNOSIS — R9431 Abnormal electrocardiogram [ECG] [EKG]: Secondary | ICD-10-CM | POA: Diagnosis not present

## 2014-05-13 DIAGNOSIS — R079 Chest pain, unspecified: Secondary | ICD-10-CM | POA: Diagnosis not present

## 2014-05-14 DIAGNOSIS — I739 Peripheral vascular disease, unspecified: Secondary | ICD-10-CM | POA: Diagnosis not present

## 2014-05-19 ENCOUNTER — Ambulatory Visit (INDEPENDENT_AMBULATORY_CARE_PROVIDER_SITE_OTHER): Payer: Medicare Other | Admitting: Neurology

## 2014-05-19 DIAGNOSIS — G4761 Periodic limb movement disorder: Secondary | ICD-10-CM

## 2014-05-19 DIAGNOSIS — G4731 Primary central sleep apnea: Secondary | ICD-10-CM

## 2014-05-19 DIAGNOSIS — G4733 Obstructive sleep apnea (adult) (pediatric): Secondary | ICD-10-CM | POA: Diagnosis not present

## 2014-05-19 DIAGNOSIS — G4710 Hypersomnia, unspecified: Secondary | ICD-10-CM

## 2014-05-19 DIAGNOSIS — R9431 Abnormal electrocardiogram [ECG] [EKG]: Secondary | ICD-10-CM

## 2014-05-21 ENCOUNTER — Telehealth: Payer: Self-pay | Admitting: Neurology

## 2014-05-21 DIAGNOSIS — R9431 Abnormal electrocardiogram [ECG] [EKG]: Secondary | ICD-10-CM

## 2014-05-21 DIAGNOSIS — G4731 Primary central sleep apnea: Secondary | ICD-10-CM

## 2014-05-21 DIAGNOSIS — G4733 Obstructive sleep apnea (adult) (pediatric): Secondary | ICD-10-CM

## 2014-05-21 DIAGNOSIS — G4761 Periodic limb movement disorder: Secondary | ICD-10-CM

## 2014-05-21 NOTE — Telephone Encounter (Signed)
Please call and inform patient that I have entered an order for treatment with PAP. He did well during the latest sleep study with BiPAP and I would like adjust his treatment setting based on the latest results. I will see the patient back in follow-up in about 6 weeks. Please also explain to the patient that I will be looking out for compliance data downloaded from the machine, which can be done remotely through a modem at times or stored on an SD card in the back of the machine. At the time of the followup appointment we will discuss sleep study results and how it is going with PAP treatment at home. Please advise patient to bring His machine at the time of the visit; at least for the first visit, even though this is cumbersome. Bringing the machine for every visit after that may not be needed, but often helps for the first visit. Please also make sure, the patient has a follow-up appointment with me in about 6 weeks from the setup date, thanks.   David: please call pt to explain results, as things are complex. EKG looked much better!  Kissa: please make the appt.   CC to Drs. Philip Aspen and [place] ASAP  Star Age, MD, PhD [facility])

## 2014-05-21 NOTE — Telephone Encounter (Signed)
Patient was contacted and given results of his most recent BIPAP study.  Patient is currently using BIPAP at home after being set up with [facility].  [facility] needs to be provided with order.  Patient was instructed that Dr. Rexene Alberts has ordered a change in his current BIPAP settings.  BIPAP setting is to be 22/18 cm with a back up rate of 12/min.  Use of a Large Resmed Airfit P10 with heated humidification.  Use of a chin strap also needed.  Pt informed that we will contact to set up 6 week follow up appt.  Pt also informed we will send a copy of results to Dr. Philip Aspen and his cardiologist Dr. [name] as well as to the pt.  Pt has appointment with Dr. [name] on Friday the 11th.

## 2014-05-23 ENCOUNTER — Encounter: Payer: Self-pay | Admitting: Neurology

## 2014-05-23 DIAGNOSIS — R0609 Other forms of dyspnea: Secondary | ICD-10-CM | POA: Diagnosis not present

## 2014-05-23 DIAGNOSIS — E119 Type 2 diabetes mellitus without complications: Secondary | ICD-10-CM | POA: Diagnosis not present

## 2014-05-23 DIAGNOSIS — I209 Angina pectoris, unspecified: Secondary | ICD-10-CM | POA: Diagnosis not present

## 2014-05-23 DIAGNOSIS — I739 Peripheral vascular disease, unspecified: Secondary | ICD-10-CM | POA: Diagnosis not present

## 2014-05-23 DIAGNOSIS — R0989 Other specified symptoms and signs involving the circulatory and respiratory systems: Secondary | ICD-10-CM | POA: Diagnosis not present

## 2014-06-11 ENCOUNTER — Encounter (HOSPITAL_COMMUNITY): Payer: Self-pay | Admitting: Pharmacy Technician

## 2014-06-11 ENCOUNTER — Encounter: Payer: Self-pay | Admitting: Family Medicine

## 2014-06-12 DIAGNOSIS — R0609 Other forms of dyspnea: Secondary | ICD-10-CM | POA: Diagnosis not present

## 2014-06-12 DIAGNOSIS — Z01810 Encounter for preprocedural cardiovascular examination: Secondary | ICD-10-CM | POA: Diagnosis not present

## 2014-06-12 DIAGNOSIS — R0789 Other chest pain: Secondary | ICD-10-CM | POA: Diagnosis not present

## 2014-06-12 DIAGNOSIS — R0689 Other abnormalities of breathing: Secondary | ICD-10-CM | POA: Diagnosis not present

## 2014-06-14 DIAGNOSIS — Z23 Encounter for immunization: Secondary | ICD-10-CM | POA: Diagnosis not present

## 2014-06-15 HISTORY — PX: CARDIAC CATHETERIZATION: SHX172

## 2014-06-17 ENCOUNTER — Encounter (HOSPITAL_COMMUNITY)
Admission: RE | Disposition: A | Discharge status: Home / Self Care | Payer: Self-pay | Source: Ambulatory Visit | Attending: Cardiology

## 2014-06-17 ENCOUNTER — Ambulatory Visit (HOSPITAL_COMMUNITY)
Admission: RE | Admit: 2014-06-17 | Discharge: 2014-06-17 | Disposition: A | Discharge status: Home / Self Care | Payer: Medicare Other | Source: Ambulatory Visit | Attending: Cardiology | Admitting: Cardiology

## 2014-06-17 DIAGNOSIS — I10 Essential (primary) hypertension: Secondary | ICD-10-CM | POA: Insufficient documentation

## 2014-06-17 DIAGNOSIS — R9430 Abnormal result of cardiovascular function study, unspecified: Secondary | ICD-10-CM | POA: Diagnosis not present

## 2014-06-17 DIAGNOSIS — E785 Hyperlipidemia, unspecified: Secondary | ICD-10-CM | POA: Diagnosis not present

## 2014-06-17 DIAGNOSIS — R0789 Other chest pain: Secondary | ICD-10-CM | POA: Diagnosis not present

## 2014-06-17 DIAGNOSIS — R079 Chest pain, unspecified: Secondary | ICD-10-CM | POA: Diagnosis not present

## 2014-06-17 DIAGNOSIS — I209 Angina pectoris, unspecified: Secondary | ICD-10-CM | POA: Diagnosis not present

## 2014-06-17 DIAGNOSIS — R9439 Abnormal result of other cardiovascular function study: Secondary | ICD-10-CM | POA: Insufficient documentation

## 2014-06-17 DIAGNOSIS — E119 Type 2 diabetes mellitus without complications: Secondary | ICD-10-CM | POA: Insufficient documentation

## 2014-06-17 HISTORY — PX: LEFT HEART CATHETERIZATION WITH CORONARY ANGIOGRAM: SHX5451

## 2014-06-17 LAB — GLUCOSE, CAPILLARY: Glucose-Capillary: 156 mg/dL — ABNORMAL HIGH (ref 70–99)

## 2014-06-17 SURGERY — LEFT HEART CATHETERIZATION WITH CORONARY ANGIOGRAM
Anesthesia: LOCAL

## 2014-06-17 MED ORDER — SODIUM CHLORIDE 0.9 % IV SOLN: 250.0000 mL | INTRAVENOUS | Status: DC | PRN

## 2014-06-17 MED ORDER — HEPARIN SODIUM (PORCINE) 1000 UNIT/ML IJ SOLN
INTRAMUSCULAR | Status: AC
  Filled 2014-06-17: qty 1

## 2014-06-17 MED ORDER — SODIUM CHLORIDE 0.9 % IJ SOLN: 3.0000 mL | INTRAMUSCULAR | Status: DC | PRN

## 2014-06-17 MED ORDER — NITROGLYCERIN 1 MG/10 ML FOR IR/CATH LAB
INTRA_ARTERIAL | Status: AC
  Filled 2014-06-17: qty 10

## 2014-06-17 MED ORDER — HYDROMORPHONE HCL 1 MG/ML IJ SOLN
INTRAMUSCULAR | Status: AC
  Filled 2014-06-17: qty 1

## 2014-06-17 MED ORDER — SODIUM CHLORIDE 0.9 % IV SOLN: 1.0000 mL/kg/h | INTRAVENOUS | Status: DC

## 2014-06-17 MED ORDER — SODIUM CHLORIDE 0.9 % IV SOLN
INTRAVENOUS | Status: DC
  Administered 2014-06-17: 09:00:00 via INTRAVENOUS

## 2014-06-17 MED ORDER — SODIUM CHLORIDE 0.9 % IJ SOLN: 3.0000 mL | Freq: Two times a day (BID) | INTRAMUSCULAR | Status: DC

## 2014-06-17 MED ORDER — ASPIRIN 81 MG PO CHEW
CHEWABLE_TABLET | ORAL | Status: AC
  Administered 2014-06-17: 81 mg via ORAL
  Filled 2014-06-17: qty 1

## 2014-06-17 MED ORDER — ASPIRIN 81 MG PO CHEW
81.0000 mg | CHEWABLE_TABLET | ORAL | Status: AC
  Administered 2014-06-17: 81 mg via ORAL

## 2014-06-17 MED ORDER — LIDOCAINE HCL (PF) 1 % IJ SOLN
INTRAMUSCULAR | Status: AC
  Filled 2014-06-17: qty 30

## 2014-06-17 MED ORDER — HEPARIN (PORCINE) IN NACL 2-0.9 UNIT/ML-% IJ SOLN
INTRAMUSCULAR | Status: AC
  Filled 2014-06-17: qty 1000

## 2014-06-17 MED ORDER — MIDAZOLAM HCL 2 MG/2ML IJ SOLN
INTRAMUSCULAR | Status: AC
  Filled 2014-06-17: qty 2

## 2014-06-17 MED ORDER — VERAPAMIL HCL 2.5 MG/ML IV SOLN
INTRAVENOUS | Status: AC
  Filled 2014-06-17: qty 2

## 2014-06-17 NOTE — Discharge Instructions (Signed)
Radial Site Care °Refer to this sheet in the next few weeks. These instructions provide you with information on caring for yourself after your procedure. Your caregiver may also give you more specific instructions. Your treatment has been planned according to current medical practices, but problems sometimes occur. Call your caregiver if you have any problems or questions after your procedure. °HOME CARE INSTRUCTIONS °· You may shower the day after the procedure. Remove the bandage (dressing) and gently wash the site with plain soap and water. Gently pat the site dry. °· Do not apply powder or lotion to the site. °· Do not submerge the affected site in water for 3 to 5 days. °· Inspect the site at least twice daily. °· Do not flex or bend the affected arm for 24 hours. °· No lifting over 5 pounds (2.3 kg) for 5 days after your procedure. °· Do not drive home if you are discharged the same day of the procedure. Have someone else drive you. °· You may drive 24 hours after the procedure unless otherwise instructed by your caregiver. °· Do not operate machinery or power tools for 24 hours. °· A responsible adult should be with you for the first 24 hours after you arrive home. °What to expect: °· Any bruising will usually fade within 1 to 2 weeks. °· Blood that collects in the tissue (hematoma) may be painful to the touch. It should usually decrease in size and tenderness within 1 to 2 weeks. °SEEK IMMEDIATE MEDICAL CARE IF: °· You have unusual pain at the radial site. °· You have redness, warmth, swelling, or pain at the radial site. °· You have drainage (other than a small amount of blood on the dressing). °· You have chills. °· You have a fever or persistent symptoms for more than 72 hours. °· You have a fever and your symptoms suddenly get worse. °· Your arm becomes pale, cool, tingly, or numb. °· You have heavy bleeding from the site. Hold pressure on the site. °Document Released: 10/01/2010 Document Revised:  11/21/2011 Document Reviewed: 10/01/2010 °ExitCare® Patient Information ©2015 [employer]. This information is not intended to replace advice given to you by your health care provider. Make sure you discuss any questions you have with your health care provider. ° °

## 2014-06-17 NOTE — H&P (Signed)
  Please see office visit notes for complete details of HPI.  

## 2014-06-17 NOTE — CV Procedure (Signed)
Procedure performed:  Left heart catheterization including hemodynamic monitoring of the left ventricle, LV gram, selective right and left coronary arteriography.  Indication patient is a 68 year old Caucasian male with history of hypertension,  hyperlipidemia,  Diabetes Mellitus   who presents with chest pain suggestive of angina pectoris. Patient undergone outpatient stress testing which had revealed moderate sized inferior wall ischemia and inferoapical ischemia and decreased ejection fraction. Multivessel coronary artery disease was suspected given his multiple medical comorbidities and presentation, hence brought to the coronary angiography suite to evaluate his coronary anatomy.  Hemodynamic data:  Left ventricular pressure was 118/0 with LVEDP of 15 mm mercury. Aortic pressure was 124/65 with a mean of 89 mm mercury. There was no pressure gradient across the aortic valve  Left ventricle: Performed in the RAO projection revealed LVEF of 50- 55%. No gross murmur symmetric, mitral regurgitation not determined due to hand contrast injection.  Right coronary artery: The vessel is Dominant. Midsegment RCA had a 20% stenosis, there is mild luminal irregularity. Moderate size PDA and PL branches, no high-grade stenosis evident.  Left main coronary artery is large and normal.  Circumflex coronary artery: A large vessel giving origin to a large obtuse marginal 1. There is a small OM 2 and distally there are small marginal branches. There is mild luminal irregularity the circumflex artery. No high-grade stenosis evident.  LAD:  LAD gives origin to several small diagonals. LAD has very mild diffuse luminal irregularities.   Ramus intermediate: Is a small to moderate caliber vessel. No significant disease is evident.  Technique: Under sterile precautions using a 6 French right radial  arterial access, a 6 French sheath was introduced into the right radial artery. A 5 [place] Tig 4 catheter was  advanced into the ascending aorta selective  right coronary artery and left coronary artery was cannulated and angiography was performed in multiple views. The catheter was pulled back Out of the body over exchange length J-wire.  Same Catheter was used to perform LV gram which was performed in RAO projection. Catheter exchanged out of the body over J-Wire. NO immediate complications noted. Patient tolerated the procedure well. A total of 50 cc of contrast was utilized for diagnostic angiography.  Rec: Medical therapy with aggressive risk factor reduction.   Disposition: Will be discharged home today with outpatient follow up.

## 2014-06-17 NOTE — Interval H&P Note (Signed)
History and Physical Interval Note:  06/17/2014 10:40 AM  Keith [name].  has presented today for surgery, with the diagnosis of abnormal stress test, cp  The various methods of treatment have been discussed with the patient and family. After consideration of risks, benefits and other options for treatment, the patient has consented to  Procedure(s): LEFT HEART CATHETERIZATION WITH CORONARY ANGIOGRAM (N/A) and possible PCI as a surgical intervention .  The patient's history has been reviewed, patient examined, no change in status, stable for surgery.  I have reviewed the patient's chart and labs.  Questions were answered to the patient's satisfaction.   Cath Lab Visit (complete for each Cath Lab visit)  Clinical Evaluation Leading to the Procedure:   ACS: No.  Non-ACS:    Anginal Classification: CCS III  Anti-ischemic medical therapy: Minimal Therapy (1 class of medications)  Non-Invasive Test Results: High-risk stress test findings: cardiac mortality >3%/year  Prior CABG: No previous CABG        [facility]

## 2014-06-23 ENCOUNTER — Ambulatory Visit (INDEPENDENT_AMBULATORY_CARE_PROVIDER_SITE_OTHER): Payer: Medicare Other | Admitting: Neurology

## 2014-06-23 ENCOUNTER — Encounter: Payer: Self-pay | Admitting: Neurology

## 2014-06-23 VITALS — BP 138/72 | HR 63 | Temp 98.1°F | Resp 14 | Ht 73.0 in | Wt 269.0 lb

## 2014-06-23 DIAGNOSIS — R9431 Abnormal electrocardiogram [ECG] [EKG]: Secondary | ICD-10-CM | POA: Diagnosis not present

## 2014-06-23 DIAGNOSIS — G4733 Obstructive sleep apnea (adult) (pediatric): Secondary | ICD-10-CM | POA: Diagnosis not present

## 2014-06-23 DIAGNOSIS — G4761 Periodic limb movement disorder: Secondary | ICD-10-CM | POA: Diagnosis not present

## 2014-06-23 DIAGNOSIS — G4731 Primary central sleep apnea: Secondary | ICD-10-CM | POA: Diagnosis not present

## 2014-06-23 NOTE — Progress Notes (Signed)
Subjective:    Patient ID: Keith [name]. is a 68 y.o. male.  HPI    Interim history:   Keith [name] is a very pleasant 68 year old right-handed gentleman with an underlying medical history of obesity, BPH, bladder stones, type 2 diabetes, degenerative joint disease and chronic back pain, hyperlipidemia, chronic rhinitis, depression, and migraine headaches, who presents for followup consultation of his severe complex sleep apnea with a primary central component. The patient is accompanied by his wife today. I first met him on 03/13/2014, at which time he reported a prior diagnosis with obstructive sleep apnea over 25 years ago. He had been using CPAP off and on for years with some improvement in snoring and in his sleep reported. He reported purchasing his own CPAP machine and had brought it to his appointment last time, which is a ResMed S6, without a humidifier and he had a large [employer] FFM, as he is a mouth breather. As he had not had a reevaluation of his sleep apnea, I suggested he return for a sleep study. He had a split-night sleep study on 04/29/2014 as well as a full night titration study subsequently on 05/19/2014 and I went over his sleep study results with him in detail today. His split-night sleep study from 04/29/2014 showed a baseline sleep efficiency at 73.9% with a latency sleep of 11.5 minutes and wake after sleep onset of 32 minutes with moderate to severe sleep fragmentation noted. He had an elevated arousal index. He had an elevated percentage of stage I and stage II sleep, and absence of dream sleep. He had multifocal PVCs. He had a brief 6 beat run of what appeared to be ventricular tachycardia followed by multifocal PVCs. Severe PLMS were noted with very little arousals. He had a total of 1 obstructive to mixed in 57 central apneas as well as 42 obstructive and 14 central hypopneas with an elevated AHI of 56.6 per hour. Baseline oxygen saturation was 91%, nadir was 78%.  He was therefore started on CPAP. This was started at 5 cm and increased to 6 cm then decreased again to 4 cm because he had ongoing central sleep disordered breathing. His AHI remained high. He had an increased and deepened dream sleep. His baseline oxygen saturation was slightly improved at 92%. Nadir was 81%. He was switched to BiPAP therapy first at 10/6 cm and titrated gradually. He still had significant central sleep apneas. He was switched to BiPAP ST with a final pressure of 16/12 at a rate of 10. However, on the final settings he still had an AHI of 11.7 per hour. I suggested he start BiPAP ST at home but was also advised to return for a full night BiPAP ST titration for proper optimalization of his treatment settings. He had a BiPAP study on 05/19/2014. Sleep efficiency was 87.6% with a latency to sleep of 14.5 minutes and wake after sleep onset of 46.5 minutes with mild to moderate sleep fragmentation noted. Arousal index was mildly elevated. He had an increased percentage of light stage sleep, nearly 5% of deep sleep, and a decreased percentage of dream sleep at only 3.2%. Latency to REM sleep was very prolonged at 350.5 minutes. Moderate periodic leg movements were noted at 36.8 per hour, resulting in an arousal index of 4.2 per hour. Rare PVCs were noted and overall his EKG looked much improved from his original sleep study from 04/29/2014. He had a total of one central apnea, 19 obstructive hypopneas and one  mixed hypopneas for the study. Baseline oxygen saturation was much improved at 95% with a nadir of 80%. On the final setting his oxygen saturation remained above 90%. Snoring was eliminated. BiPAP ST was started at 10/6 cm with a rate of 10 using a large fullface mask. A chin strap was needed due to residual mouth opening. BiPAP was titrated to a pressure of 22/18 with a rate of 12 per minute. On this final setting his AHI was 0.8 per hour.  I reviewed his compliance data from 05/21/2014 through  06/19/2014 which is a total of 30 days during which time he used his machine every night, percent used days greater than 4 hours was 90%, indicating excellent compliance, settings of 22/18 with a rate of 12 with a residual AHI of 6.9 per hour but leak was at times high with the 95th percentile 42.3 L per minute. Average usage of 6 hours and 11 minutes. More recently in the last 10-14 days his sleep had improved. In the interim he had a LHC on 06/17/14.  Today, he reports that he has been using his BiPAP machine regularly with the exception of when he had his left heart catheterization in the hospital and then he also went on a two-day trip to a friend's house and did not have access to electricity in his room at the time. His wife adds that he has a tendency to take a prolonged nap but does not typically uses BiPAP at the time. Overall, he has adjusted to treatment but does note a higher pressure and has a tendency to have severe mouth dryness. Nocturia has improved by about 50% he feels. He now wakes up every 3 hours as opposed to every one or 2 hours. He has noted air leaking from the upper portion of the mass. This improved when he switched out to a large fullface mask from a medium fullface mask.   His Past Medical History Is Significant For: Past Medical History  Diagnosis Date  . Hypertension   . Benign prostatic hypertrophy     takes Flomax daily  . ED (erectile dysfunction)   . Complication of anesthesia     hard to wake up  . Hyperlipidemia   . Sleep apnea     sleep study done 20+yrs ago  . Arthritis     bil knees  . Anxiety     takes Celexa  . Spinal headache   . PONV (postoperative nausea and vomiting)   . Diabetes mellitus without complication   . BPH with urinary obstruction 06/25/2013    127cc prostate with large middle lobe and bladder stones.   . Bladder stones 06/25/2013    1.9cm bladder stone.    His Past Surgical History Is Significant For: Past Surgical History   Procedure Laterality Date  . Colonoscopy    . Knee arthroscopy  09/07/2010    left knee  . Cataract extraction w/ intraocular lens  implant, bilateral  03/13/2003  . Retinal detachment repair w/ scleral buckle le  10/14/2002    Dr Zadie Rhine  . Knee arthroscopy  05/27/2000    left knee  . Tonsillectomy      age 14  . Vasectomy  1982  . Total knee arthroplasty  08/01/2011    Procedure: TOTAL KNEE ARTHROPLASTY;  Surgeon: Lorn Junes, MD;  Location: [facility];  Service: Orthopedics;  Laterality: Right;  TOTAL KNEE ARTHROPLASTY  RIGHT SIDE  . Cystoscopy with litholapaxy N/A 06/25/2013    Procedure: CYSTOSCOPY  WITH LITHOLAPAXY;  Surgeon: [name], MD;  Location: [facility];  Service: Urology;  Laterality: N/A;  . Transurethral resection of prostate N/A 06/25/2013    Procedure: TRANSURETHRAL RESECTION OF THE PROSTATE WITH GYRUS INSTRUMENTS;  Surgeon: [name], MD;  Location: [facility];  Service: Urology;  Laterality: N/A;  . Cardiac catheterization  06-15-14    His Family History Is Significant For: Family History  Problem Relation Age of Onset  . Arthritis Other   . Cancer Other     breast, skin  . Coronary artery disease Other   . Heart disease Other   . Hypertension Mother   . Heart failure Father   . Anesthesia problems Neg Hx   . Hypotension Neg Hx   . Malignant hyperthermia Neg Hx   . Pseudochol deficiency Neg Hx     His Social History Is Significant For: History   Social History  . Marital Status: Married    Spouse Name: N/A    Number of Children: N/A  . Years of Education: N/A   Social History Main Topics  . Smoking status: Never Smoker   . Smokeless tobacco: None  . Alcohol Use: No  . Drug Use: No  . Sexual Activity: None   Other Topics Concern  . None   Social History Narrative   Right handed.     His Allergies Are:  Allergies  Allergen Reactions  . Penicillins Anaphylaxis    "eyes swell shut"  :   His Current Medications Are:  Outpatient Encounter  Prescriptions as of 06/23/2014  Medication Sig  . aspirin EC 81 MG tablet Take 81 mg by mouth daily.  Marland Kitchen atorvastatin (LIPITOR) 20 MG tablet Take 20 mg by mouth daily.  . celecoxib (CELEBREX) 200 MG capsule   . Cholecalciferol (VITAMIN D) 2000 UNITS tablet Take 2,000 Units by mouth daily.  . citalopram (CELEXA) 40 MG tablet Take 40 mg by mouth daily.  . Dapagliflozin Propanediol (FARXIGA) 10 MG TABS Take 10 mg by mouth daily.  Marland Kitchen lisinopril (PRINIVIL,ZESTRIL) 10 MG tablet Take 10 mg by mouth daily.  . meloxicam (MOBIC) 15 MG tablet Take 15 mg by mouth daily.  . metoprolol tartrate (LOPRESSOR) 25 MG tablet Take 25 mg by mouth 2 (two) times daily.  Marland Kitchen NITROSTAT 0.4 MG SL tablet   . senna-docusate (SENOKOT-S) 8.6-50 MG per tablet Take 1 tablet by mouth daily as needed for mild constipation.  . tamsulosin (FLOMAX) 0.4 MG CAPS capsule   . [DISCONTINUED] tadalafil (CIALIS) 20 MG tablet Take 20 mg by mouth daily as needed. For erectil dysfunction  :  Review of Systems:  Out of a complete 14 point review of systems, all are reviewed and negative with the exception of these symptoms as listed below:   Review of Systems  Genitourinary:       Frequency  Musculoskeletal:       Joint pain  Neurological:       RLS, apnea, daytime sleepiness    Objective:  Neurologic Exam  Physical Exam Physical Examination:   Filed Vitals:   06/23/14 1023  BP: 138/72  Pulse: 63  Temp: 98.1 F (36.7 C)  Resp: 14    General Examination: The patient is a very pleasant 68 y.o. male in no acute distress. He appears well-developed and well-nourished and well groomed.   HEENT: Normocephalic, atraumatic, pupils are equal, round and reactive to light and accommodation. Funduscopic exam is normal with sharp disc margins noted. Extraocular tracking is good without limitation  to gaze excursion or nystagmus noted. Normal smooth pursuit is noted. Hearing is grossly intact. Face is symmetric with normal facial  animation and normal facial sensation. Speech is clear with no dysarthria noted. There is no hypophonia. There is no lip, neck/head, jaw or voice tremor. Neck is supple with full range of passive and active motion. There are no carotid bruits on auscultation. Oropharynx exam reveals: moderate mouth dryness, adequate dental hygiene and moderate airway crowding, due to redundant soft palate. Mallampati is class II. Tongue protrudes centrally and palate elevates symmetrically. Tonsils are absent. He has a Mild to moderate overbite. Nasal inspection reveals no significant nasal mucosal bogginess or redness and no septal deviation. He has mild redness over the nasal bridge and by the sides of his nostrils.  Chest: Clear to auscultation without wheezing, rhonchi or crackles noted.  Heart: S1+S2+0, regular and normal without murmurs, rubs or gallops noted.   Abdomen: Soft, non-tender and non-distended with normal bowel sounds appreciated on auscultation.  Extremities: There is trace pitting edema in the distal lower extremities bilaterally. Pedal pulses are intact.  Skin: Warm and dry without trophic changes noted. There are no varicose veins except mild on the L.  Musculoskeletal: exam reveals no obvious joint deformities, tenderness or joint swelling or erythema. He has R knee pain and had a knee replacement on the R.   Neurologically:  Mental status: The patient is awake, alert and oriented in all 4 spheres. His immediate and remote memory, attention, language skills and fund of knowledge are appropriate. There is no evidence of aphasia, agnosia, apraxia or anomia. Speech is clear with normal prosody and enunciation. Thought process is linear. Mood is normal and affect is normal.  Cranial nerves II - XII are as described above under HEENT exam. In addition: shoulder shrug is normal with equal shoulder height noted. Motor exam: Normal bulk, strength and tone is noted. There is no drift, tremor or  rebound. Romberg is negative. Reflexes are 2+ throughout with the exception of trace reflex of his in the right knee. Babinski: Toes are flexor bilaterally. Fine motor skills and coordination: intact with normal finger taps, normal hand movements, normal rapid alternating patting, normal foot taps and normal foot agility.  Cerebellar testing: No dysmetria or intention tremor on finger to nose testing. Heel to shin is unremarkable bilaterally, with the exception of mild difficulty on the right side due to his right knee pain. There is no truncal or gait ataxia.  Sensory exam: intact to light touch, pinprick, vibration, temperature sense in the upper and lower extremities.  Gait, station and balance: He stands easily. No veering to one side is noted. No leaning to one side is noted. Posture is age-appropriate and stance is narrow based. Gait shows normal stride length and normal pace. No problems turning are noted. He turns en bloc. Tandem walk is mildly difficult for him.             Assessment and Plan:  In summary, Keith Perrell. is a very pleasant 68 year old male with an underlying medical history of obesity, BPH, bladder stones, type 2 diabetes, degenerative joint disease and chronic back pain, hyperlipidemia, chronic rhinitis, depression, and migraine headaches, who was diagnosed with obstructive sleep apnea over 25 years ago. His most recent sleep study evaluation demonstrated primary central apnea and obstructive sleep disordered breathing which we treated with BiPAP ST and he came back for another BiPAP titration to optimize treatment setting and mask fitting in September.  Today we talked about his to sleep study results in detail and also his compliance data in detail as well. He has done quite well with compliance and also feels improved particularly with his nocturia reduced by 50% approximately. He is congratulated on his compliance but also reminded not to skip any days. He is also reminded  that when he takes a nap he should use his BiPAP machine. He still has an increased level of leak. He has switched out to a larger fullface mask which I believe has helped looking at the data on his download. Nevertheless, his leak may also be due to his facial hair. He will consider shaving portions of his full beard. I would like to see another 30 day download to be able to see consistent improvement in his leak. His residual AHI is slightly suboptimal but much improved from baseline. I answered all his questions today and the patient and his wife were in agreement. I would like for him to come back for recheck in about 3 months. In the interim, he is encouraged to call for any questions and I will be on the lookout for another updated download to look at the leak.

## 2014-06-23 NOTE — Patient Instructions (Signed)
Please continue using your BiPAP regularly. While your insurance requires that you use PAP at least 4 hours each night on 70% of the nights, I recommend, that you not skip any nights and use it throughout the night if you can. Getting used to BiPAP and staying with the treatment long term does take time and patience and discipline. Untreated obstructive sleep apnea when it is moderate to severe can have an adverse impact on cardiovascular health and raise her risk for heart disease, arrhythmias, hypertension, congestive heart failure, stroke and diabetes. Untreated obstructive sleep apnea causes sleep disruption, nonrestorative sleep, and sleep deprivation. This can have an impact on your day to day functioning and cause daytime sleepiness and impairment of cognitive function, memory loss, mood disturbance, and problems focussing. Using PAP regularly can improve these symptoms.

## 2014-06-26 DIAGNOSIS — I209 Angina pectoris, unspecified: Secondary | ICD-10-CM | POA: Diagnosis not present

## 2014-06-26 DIAGNOSIS — G4731 Primary central sleep apnea: Secondary | ICD-10-CM | POA: Diagnosis not present

## 2014-06-26 DIAGNOSIS — R252 Cramp and spasm: Secondary | ICD-10-CM | POA: Diagnosis not present

## 2014-06-26 DIAGNOSIS — R0609 Other forms of dyspnea: Secondary | ICD-10-CM | POA: Diagnosis not present

## 2014-06-27 DIAGNOSIS — Z6837 Body mass index (BMI) 37.0-37.9, adult: Secondary | ICD-10-CM | POA: Diagnosis not present

## 2014-06-27 DIAGNOSIS — I2510 Atherosclerotic heart disease of native coronary artery without angina pectoris: Secondary | ICD-10-CM | POA: Diagnosis not present

## 2014-06-27 DIAGNOSIS — E1129 Type 2 diabetes mellitus with other diabetic kidney complication: Secondary | ICD-10-CM | POA: Diagnosis not present

## 2014-06-27 DIAGNOSIS — E1121 Type 2 diabetes mellitus with diabetic nephropathy: Secondary | ICD-10-CM | POA: Diagnosis not present

## 2014-06-27 DIAGNOSIS — I10 Essential (primary) hypertension: Secondary | ICD-10-CM | POA: Diagnosis not present

## 2014-06-27 DIAGNOSIS — G4733 Obstructive sleep apnea (adult) (pediatric): Secondary | ICD-10-CM | POA: Diagnosis not present

## 2014-08-12 ENCOUNTER — Encounter: Payer: Self-pay | Admitting: Neurology

## 2014-08-21 ENCOUNTER — Encounter (HOSPITAL_COMMUNITY): Payer: Self-pay | Admitting: Cardiology

## 2014-09-23 ENCOUNTER — Ambulatory Visit: Payer: Medicare Other | Admitting: Neurology

## 2014-09-25 ENCOUNTER — Encounter: Payer: Self-pay | Admitting: Neurology

## 2014-09-25 ENCOUNTER — Ambulatory Visit (INDEPENDENT_AMBULATORY_CARE_PROVIDER_SITE_OTHER): Payer: Medicare Other | Admitting: Neurology

## 2014-09-25 VITALS — BP 154/77 | HR 57 | Temp 97.5°F | Resp 15 | Ht 72.0 in | Wt 276.0 lb

## 2014-09-25 DIAGNOSIS — G4733 Obstructive sleep apnea (adult) (pediatric): Secondary | ICD-10-CM | POA: Diagnosis not present

## 2014-09-25 DIAGNOSIS — G4731 Primary central sleep apnea: Secondary | ICD-10-CM | POA: Diagnosis not present

## 2014-09-25 DIAGNOSIS — R9431 Abnormal electrocardiogram [ECG] [EKG]: Secondary | ICD-10-CM

## 2014-09-25 NOTE — Progress Notes (Signed)
Subjective:    Patient ID: Keith [name]. is a 69 y.o. male.  HPI     Interim history:   Mr. Boxley is a very pleasant 69 year old right-handed gentleman with an underlying medical history of obesity, BPH, bladder stones, type 2 diabetes, degenerative joint disease and chronic back pain, hyperlipidemia, chronic rhinitis, depression, and migraine headaches, who presents for followup consultation of his severe complex sleep apnea with a primary central component. The patient is unaccompanied today. I last saw him on 06/23/2014, at which time he reported using his BiPAP machine regularly. He had left heart catheterization and also went on a 2 day trip to a friend's house where there was no access to electricity in the room he was not using his BiPAP during his naps. He reported severe mouth dryness at times. His nocturia had improved about 50% he felt. He had some air leak issues from the mask which improved when he changed the size of the mask. He was using a fullface mask. I discussed a sleep study results at length last time. His physical exam was stable. I asked him to continue to use his BiPAP regularly, also during his naps.  Today, I reviewed his compliance data from 08/23/2014 through 09/21/2014 which is a total of 30 days during which time he used his BiPAP every night except for 3 nights. Percent used days greater than 4 hours was 80%, indicating very good compliance, average usage of 5 hours and 26 minutes, setting of BiPAP ST at 22/18 with a rate of 12, residual AHI acceptable at 3.6 per hour and leak acceptable with the 95th percentile at 15.1 L/m.  Today, he reports, that he has still some trouble tolerating the mask. He has a full facemask but likes his old mask from years ago. This is a [employer] fit life fullface mask. He has his yearly physical coming up next month with his primary care physician. He had a cold recently but no other illness or changes in his medications. His nocturia  is better. Overall he feels reasonably well treated with his BiPAP. He is compliant with treatment. He has an eye exam coming up as well. He has not seen an eye doctor in about 3 years. His diabetes is under reasonable control. He denies any neuropathy type symptoms.  I first met him on 03/13/2014, at which time he reported a prior diagnosis with obstructive sleep apnea over 25 years ago. He had been using CPAP off and on for years with some improvement in snoring and in his sleep reported. He reported purchasing his own CPAP machine and had brought it to his appointment last time, which is a ResMed S6, without a humidifier and he had a large [employer] FFM, as he is a mouth breather. As he had not had a reevaluation of his sleep apnea, I suggested he return for a sleep study. He had a split-night sleep study on 04/29/2014 as well as a full night titration study subsequently on 05/19/2014 and I went over his sleep study results with him in detail today. His split-night sleep study from 04/29/2014 showed a baseline sleep efficiency at 73.9% with a latency sleep of 11.5 minutes and wake after sleep onset of 32 minutes with moderate to severe sleep fragmentation noted. He had an elevated arousal index. He had an elevated percentage of stage I and stage II sleep, and absence of dream sleep. He had multifocal PVCs. He had a brief 6 beat run of what  appeared to be ventricular tachycardia followed by multifocal PVCs. Severe PLMS were noted with very little arousals. He had a total of 1 obstructive to mixed in 57 central apneas as well as 42 obstructive and 14 central hypopneas with an elevated AHI of 56.6 per hour. Baseline oxygen saturation was 91%, nadir was 78%. He was therefore started on CPAP. This was started at 5 cm and increased to 6 cm then decreased again to 4 cm because he had ongoing central sleep disordered breathing. His AHI remained high. He had an increased and deepened dream sleep. His baseline  oxygen saturation was slightly improved at 92%. Nadir was 81%. He was switched to BiPAP therapy first at 10/6 cm and titrated gradually. He still had significant central sleep apneas. He was switched to BiPAP ST with a final pressure of 16/12 at a rate of 10. However, on the final settings he still had an AHI of 11.7 per hour. I suggested he start BiPAP ST at home but was also advised to return for a full night BiPAP ST titration for proper optimalization of his treatment settings. He had a BiPAP study on 05/19/2014. Sleep efficiency was 87.6% with a latency to sleep of 14.5 minutes and wake after sleep onset of 46.5 minutes with mild to moderate sleep fragmentation noted. Arousal index was mildly elevated. He had an increased percentage of light stage sleep, nearly 5% of deep sleep, and a decreased percentage of dream sleep at only 3.2%. Latency to REM sleep was very prolonged at 350.5 minutes. Moderate periodic leg movements were noted at 36.8 per hour, resulting in an arousal index of 4.2 per hour. Rare PVCs were noted and overall his EKG looked much improved from his original sleep study from 04/29/2014. He had a total of one central apnea, 19 obstructive hypopneas and one mixed hypopneas for the study. Baseline oxygen saturation was much improved at 95% with a nadir of 80%. On the final setting his oxygen saturation remained above 90%. Snoring was eliminated. BiPAP ST was started at 10/6 cm with a rate of 10 using a large fullface mask. A chin strap was needed due to residual mouth opening. BiPAP was titrated to a pressure of 22/18 with a rate of 12 per minute. On this final setting his AHI was 0.8 per hour.   I reviewed his compliance data from 05/21/2014 through 06/19/2014 which is a total of 30 days during which time he used his machine every night, percent used days greater than 4 hours was 90%, indicating excellent compliance, settings of 22/18 with a rate of 12 with a residual AHI of 6.9 per hour but  leak was at times high with the 95th percentile 42.3 L per minute. Average usage of 6 hours and 11 minutes. More recently in the last 10-14 days his sleep had improved. In the interim he had a LHC on 06/17/14.     His Past Medical History Is Significant For: Past Medical History  Diagnosis Date  . Hypertension   . Benign prostatic hypertrophy     takes Flomax daily  . ED (erectile dysfunction)   . Complication of anesthesia     hard to wake up  . Hyperlipidemia   . Sleep apnea     sleep study done 20+yrs ago  . Arthritis     bil knees  . Anxiety     takes Celexa  . Spinal headache   . PONV (postoperative nausea and vomiting)   . Diabetes mellitus without complication   .  BPH with urinary obstruction 06/25/2013    127cc prostate with large middle lobe and bladder stones.   . Bladder stones 06/25/2013    1.9cm bladder stone.    His Past Surgical History Is Significant For: Past Surgical History  Procedure Laterality Date  . Colonoscopy    . Knee arthroscopy  09/07/2010    left knee  . Cataract extraction w/ intraocular lens  implant, bilateral  03/13/2003  . Retinal detachment repair w/ scleral buckle le  10/14/2002    Dr Zadie Rhine  . Knee arthroscopy  05/27/2000    left knee  . Tonsillectomy      age 75  . Vasectomy  1982  . Total knee arthroplasty  08/01/2011    Procedure: TOTAL KNEE ARTHROPLASTY;  Surgeon: Lorn Junes, MD;  Location: [facility];  Service: Orthopedics;  Laterality: Right;  TOTAL KNEE ARTHROPLASTY  RIGHT SIDE  . Cystoscopy with litholapaxy N/A 06/25/2013    Procedure: CYSTOSCOPY WITH LITHOLAPAXY;  Surgeon: [name], MD;  Location: [facility];  Service: Urology;  Laterality: N/A;  . Transurethral resection of prostate N/A 06/25/2013    Procedure: TRANSURETHRAL RESECTION OF THE PROSTATE WITH GYRUS INSTRUMENTS;  Surgeon: [name], MD;  Location: [facility];  Service: Urology;  Laterality: N/A;  . Cardiac catheterization  06-15-14  . Left heart catheterization with  coronary angiogram N/A 06/17/2014    Procedure: LEFT HEART CATHETERIZATION WITH CORONARY ANGIOGRAM;  Surgeon: Laverda Page, MD;  Location: [facility] C[facility];  Service: Cardiovascular;  Laterality: N/A;    His Family History Is Significant For: Family History  Problem Relation Age of Onset  . Arthritis Other   . Cancer Other     breast, skin  . Coronary artery disease Other   . Heart disease Other   . Hypertension Mother   . Heart failure Father   . Anesthesia problems Neg Hx   . Hypotension Neg Hx   . Malignant hyperthermia Neg Hx   . Pseudochol deficiency Neg Hx     His Social History Is Significant For: History   Social History  . Marital Status: Married    Spouse Name: N/A    Number of Children: N/A  . Years of Education: N/A   Social History Main Topics  . Smoking status: Never Smoker   . Smokeless tobacco: None  . Alcohol Use: No  . Drug Use: No  . Sexual Activity: None   Other Topics Concern  . None   Social History Narrative   Right handed.     His Allergies Are:  Allergies  Allergen Reactions  . Penicillins Anaphylaxis    "eyes swell shut"  :   His Current Medications Are:  Outpatient Encounter Prescriptions as of 09/25/2014  Medication Sig  . aspirin EC 81 MG tablet Take 81 mg by mouth daily.  Marland Kitchen atorvastatin (LIPITOR) 20 MG tablet Take 20 mg by mouth daily.  . celecoxib (CELEBREX) 200 MG capsule   . Cholecalciferol (VITAMIN D) 2000 UNITS tablet Take 2,000 Units by mouth daily.  . citalopram (CELEXA) 40 MG tablet Take 40 mg by mouth daily.  . Dapagliflozin Propanediol (FARXIGA) 10 MG TABS Take 10 mg by mouth daily.  Marland Kitchen lisinopril (PRINIVIL,ZESTRIL) 10 MG tablet Take 10 mg by mouth daily.  . meloxicam (MOBIC) 15 MG tablet Take 15 mg by mouth daily.  . metFORMIN (GLUCOPHAGE) 500 MG tablet Take by mouth 2 (two) times daily with a meal.  . metoprolol tartrate (LOPRESSOR) 25 MG tablet Take 25  mg by mouth 2 (two) times daily.  Marland Kitchen NITROSTAT 0.4 MG SL tablet    . senna-docusate (SENOKOT-S) 8.6-50 MG per tablet Take 1 tablet by mouth daily as needed for mild constipation.  . tamsulosin (FLOMAX) 0.4 MG CAPS capsule   :  Review of Systems:  Out of a complete 14 point review of systems, all are reviewed and negative with the exception of these symptoms as listed below:   Review of Systems  All other systems reviewed and are negative.   Objective:  Neurologic Exam  Physical Exam Physical Examination:   Filed Vitals:   09/25/14 0808  BP: 154/77  Pulse: 57  Temp: 97.5 F (36.4 C)  Resp: 15     General Examination: The patient is a very pleasant 69 y.o. male in no acute distress. He appears well-developed and well-nourished and well groomed.   HEENT: Normocephalic, atraumatic, pupils are equal, round and reactive to light and accommodation. Funduscopic exam is normal with sharp disc margins noted. Extraocular tracking is good without limitation to gaze excursion or nystagmus noted. Normal smooth pursuit is noted. Hearing is grossly intact. Face is symmetric with normal facial animation and normal facial sensation. Speech is clear with no dysarthria noted. There is no hypophonia. There is no lip, neck/head, jaw or voice tremor. Neck is supple with full range of passive and active motion. There are no carotid bruits on auscultation. Oropharynx exam reveals: moderate mouth dryness, adequate dental hygiene and moderate airway crowding, due to redundant soft palate. Mallampati is class II. Tongue protrudes centrally and palate elevates symmetrically. Tonsils are absent. He has a Mild to moderate overbite. Nasal inspection reveals no significant nasal mucosal bogginess or redness and no septal deviation. He has mild redness over the nasal bridge and by the sides of his nostrils.  Chest: Clear to auscultation without wheezing, rhonchi or crackles noted.  Heart: S1+S2+0, regular and normal without murmurs, rubs or gallops noted.   Abdomen: Soft,  non-tender and non-distended with normal bowel sounds appreciated on auscultation.  Extremities: There is trace pitting edema in the distal lower extremities bilaterally. Pedal pulses are intact.  Skin: Warm and dry without trophic changes noted. There are no varicose veins except mild on the L.  Musculoskeletal: exam reveals no obvious joint deformities, tenderness or joint swelling or erythema. He had R knee replacement.   Neurologically:  Mental status: The patient is awake, alert and oriented in all 4 spheres. His immediate and remote memory, attention, language skills and fund of knowledge are appropriate. There is no evidence of aphasia, agnosia, apraxia or anomia. Speech is clear with normal prosody and enunciation. Thought process is linear. Mood is normal and affect is normal.  Cranial nerves II - XII are as described above under HEENT exam. In addition: shoulder shrug is normal with equal shoulder height noted. Motor exam: Normal bulk, strength and tone is noted. There is no drift, tremor or rebound. Romberg is negative. Reflexes are 2+ throughout with the exception of trace reflex of his in the right knee. Fine motor skills are intact.  Cerebellar testing: No dysmetria or intention tremor on finger to nose testing. Heel to shin is unremarkable bilaterally, with the exception of mild difficulty on the right side due to his right knee pain. There is no truncal or gait ataxia.  Sensory exam: intact to light touch, vibration, temperature sense in the upper and lower extremities.  Gait, station and balance: He stands easily. No veering to one side is noted.  No leaning to one side is noted. Posture is age-appropriate and stance is narrow based. Gait shows normal stride length and normal pace. No problems turning are noted. He turns en bloc. Tandem walk is mildly difficult for him, unchanged.             Assessment and Plan:  In summary, Quadry Kampa. is a very pleasant 69 year old male with  an underlying medical history of obesity, BPH, bladder stones, type 2 diabetes, degenerative joint disease and chronic back pain, hyperlipidemia, chronic rhinitis, depression, and migraine headaches, who was diagnosed with obstructive sleep apnea over 25 years ago. His most recent sleep study evaluation demonstrated primary central apnea and obstructive sleep disordered breathing which we treated with BiPAP ST and he came back for another BiPAP titration to optimize treatment setting and mask fitting in September 2015. He is compliant with treatment. He started using his old mask from before. I will try to get this ordered for him. We talked about his sleep test results again and his compliance data. His nocturia is improved and he feels well. He is commended for his treatment adherence. Leak has been low in general. At this juncture, he is doing quite well. His physical exam is stable. His residual AHI is within therapeutic range. At this point him back in about 6 months, sooner if need be. I answered all his questions today and he was in agreement.

## 2014-09-25 NOTE — Patient Instructions (Signed)
Keep doing what you are doing with your biPAP use. Try not to skip any days. We will try to get your Fit Life mask.  Decrease the humidifier setting so the water lasts longer, through the night.

## 2014-10-22 DIAGNOSIS — E1129 Type 2 diabetes mellitus with other diabetic kidney complication: Secondary | ICD-10-CM | POA: Diagnosis not present

## 2014-10-22 DIAGNOSIS — I10 Essential (primary) hypertension: Secondary | ICD-10-CM | POA: Diagnosis not present

## 2014-10-22 DIAGNOSIS — Z0000 Encounter for general adult medical examination without abnormal findings: Secondary | ICD-10-CM | POA: Diagnosis not present

## 2014-10-22 DIAGNOSIS — E1121 Type 2 diabetes mellitus with diabetic nephropathy: Secondary | ICD-10-CM | POA: Diagnosis not present

## 2014-10-22 DIAGNOSIS — E785 Hyperlipidemia, unspecified: Secondary | ICD-10-CM | POA: Diagnosis not present

## 2014-10-22 DIAGNOSIS — Z125 Encounter for screening for malignant neoplasm of prostate: Secondary | ICD-10-CM | POA: Diagnosis not present

## 2014-10-28 ENCOUNTER — Telehealth: Payer: Self-pay | Admitting: Neurology

## 2014-10-28 NOTE — Telephone Encounter (Signed)
Patient is calling in regard to the mask that was to be called in per his last visit in January to [facility]. Please call.

## 2014-10-29 DIAGNOSIS — I2510 Atherosclerotic heart disease of native coronary artery without angina pectoris: Secondary | ICD-10-CM | POA: Diagnosis not present

## 2014-10-29 DIAGNOSIS — I10 Essential (primary) hypertension: Secondary | ICD-10-CM | POA: Diagnosis not present

## 2014-10-29 DIAGNOSIS — Z6836 Body mass index (BMI) 36.0-36.9, adult: Secondary | ICD-10-CM | POA: Diagnosis not present

## 2014-10-29 DIAGNOSIS — E785 Hyperlipidemia, unspecified: Secondary | ICD-10-CM | POA: Diagnosis not present

## 2014-10-29 DIAGNOSIS — Z1389 Encounter for screening for other disorder: Secondary | ICD-10-CM | POA: Diagnosis not present

## 2014-10-29 DIAGNOSIS — Z125 Encounter for screening for malignant neoplasm of prostate: Secondary | ICD-10-CM | POA: Diagnosis not present

## 2014-10-29 DIAGNOSIS — Z23 Encounter for immunization: Secondary | ICD-10-CM | POA: Diagnosis not present

## 2014-10-29 DIAGNOSIS — G4733 Obstructive sleep apnea (adult) (pediatric): Secondary | ICD-10-CM | POA: Diagnosis not present

## 2014-10-29 DIAGNOSIS — E1121 Type 2 diabetes mellitus with diabetic nephropathy: Secondary | ICD-10-CM | POA: Diagnosis not present

## 2014-10-29 DIAGNOSIS — E1129 Type 2 diabetes mellitus with other diabetic kidney complication: Secondary | ICD-10-CM | POA: Diagnosis not present

## 2014-10-29 DIAGNOSIS — R312 Other microscopic hematuria: Secondary | ICD-10-CM | POA: Diagnosis not present

## 2014-10-30 DIAGNOSIS — Z1212 Encounter for screening for malignant neoplasm of rectum: Secondary | ICD-10-CM | POA: Diagnosis not present

## 2014-12-03 ENCOUNTER — Telehealth: Payer: Self-pay | Admitting: Neurology

## 2014-12-03 NOTE — Telephone Encounter (Signed)
Called and spoke with wife, rescheduled 12/23/14 appointment from 11:30 to 8:30,confirmed with wife.

## 2014-12-23 ENCOUNTER — Encounter: Payer: Self-pay | Admitting: Neurology

## 2014-12-23 ENCOUNTER — Ambulatory Visit (INDEPENDENT_AMBULATORY_CARE_PROVIDER_SITE_OTHER): Payer: Medicare Other | Admitting: Neurology

## 2014-12-23 VITALS — BP 131/73 | HR 58 | Resp 18 | Ht 72.0 in | Wt 275.0 lb

## 2014-12-23 DIAGNOSIS — R351 Nocturia: Secondary | ICD-10-CM | POA: Diagnosis not present

## 2014-12-23 DIAGNOSIS — G4733 Obstructive sleep apnea (adult) (pediatric): Secondary | ICD-10-CM

## 2014-12-23 DIAGNOSIS — G4731 Primary central sleep apnea: Secondary | ICD-10-CM

## 2014-12-23 NOTE — Progress Notes (Signed)
Subjective:    Patient ID: Keith [name]. is a 69 y.o. male.  HPI     Interim history:   Keith [name] is a very pleasant 69 year old right-handed gentleman with an underlying medical history of obesity, BPH, bladder stones, type 2 diabetes, degenerative joint disease and chronic back pain, hyperlipidemia, chronic rhinitis, depression, and migraine headaches, who presents for followup consultation of his severe complex sleep apnea with a primary central component. The patient is unaccompanied today. I last saw him on 09/25/2014, at which time he reported still some trouble tolerating his mask. He started using his old mask from years ago and I tried to order it. Overall, he felt reasonably well treated with BiPAP and he was compliant. I suggested a six-month follow-up.  Today, 12/23/2014: Reviewed his BiPAP ST compliance data from 11/22/2014 through 12/21/2014 which is a total of 30 days during which time he used his mask only 18 days with percent used days greater than 4 hours at only 23%, indicating poor compliance and much worse compliance than before. Average usage for all nights of only 2 hours and 2 minutes, pressure at 22/18 cm with a rate of 12. Residual AHI borderline at 5.9 and leak for the most part acceptable with the 95th percentile at 23.3 L/m.   Today, 12/23/2014: He reports that he had a cough a few weeks ago and could not use his BiPAP. This explains that he did not use his machine as well at the time. He has in the last week or 10 days picked up his usage which is also reflected in his compliance data. He does not need any new mask. He got a new mask from our sleep lab. He has had a checkup with his primary care physician a couple months ago and blood work which he reports was unremarkable. His blood pressure looks good. He has had no recent medication changes. He has dryness of his mouth at night and sips water. Nocturia is 3 per night. This is actually improved from before BiPAP  treatment when he used to get up 6 times per night on average. Overall he is pleased with how he is doing. He is back on track with using his BiPAP. He took over-the-counter cough medicine and has improved   Previously:  I saw him on 06/23/2014, at which time he reported using his BiPAP machine regularly. He had left heart catheterization and also went on a 2 day trip to a friend's house where there was no access to electricity in the room he was not using his BiPAP during his naps. He reported severe mouth dryness at times. His nocturia had improved about 50% he felt. He had some air leak issues from the mask which improved when he changed the size of the mask. He was using a fullface mask. I discussed a sleep study results at length last time. His physical exam was stable. I asked him to continue to use his BiPAP regularly, also during his naps.  I reviewed his compliance data from 08/23/2014 through 09/21/2014 which is a total of 30 days during which time he used his BiPAP every night except for 3 nights. Percent used days greater than 4 hours was 80%, indicating very good compliance, average usage of 5 hours and 26 minutes, setting of BiPAP ST at 22/18 with a rate of 12, residual AHI acceptable at 3.6 per hour and leak acceptable with the 95th percentile at 15.1 L/m.  I first met him on 03/13/2014,  at which time he reported a prior diagnosis with obstructive sleep apnea over 25 years ago. He had been using CPAP off and on for years with some improvement in snoring and in his sleep reported. He reported purchasing his own CPAP machine and had brought it to his appointment last time, which is a ResMed S6, without a humidifier and he had a large [employer] FFM, as he is a mouth breather. As he had not had a reevaluation of his sleep apnea, I suggested he return for a sleep study. He had a split-night sleep study on 04/29/2014 as well as a full night titration study subsequently on 05/19/2014 and I  went over his sleep study results with him in detail today. His split-night sleep study from 04/29/2014 showed a baseline sleep efficiency at 73.9% with a latency sleep of 11.5 minutes and wake after sleep onset of 32 minutes with moderate to severe sleep fragmentation noted. He had an elevated arousal index. He had an elevated percentage of stage I and stage II sleep, and absence of dream sleep. He had multifocal PVCs. He had a brief 6 beat run of what appeared to be ventricular tachycardia followed by multifocal PVCs. Severe PLMS were noted with very little arousals. He had a total of 1 obstructive to mixed in 57 central apneas as well as 42 obstructive and 14 central hypopneas with an elevated AHI of 56.6 per hour. Baseline oxygen saturation was 91%, nadir was 78%. He was therefore started on CPAP. This was started at 5 cm and increased to 6 cm then decreased again to 4 cm because he had ongoing central sleep disordered breathing. His AHI remained high. He had an increased and deepened dream sleep. His baseline oxygen saturation was slightly improved at 92%. Nadir was 81%. He was switched to BiPAP therapy first at 10/6 cm and titrated gradually. He still had significant central sleep apneas. He was switched to BiPAP ST with a final pressure of 16/12 at a rate of 10. However, on the final settings he still had an AHI of 11.7 per hour. I suggested he start BiPAP ST at home but was also advised to return for a full night BiPAP ST titration for proper optimalization of his treatment settings. He had a BiPAP study on 05/19/2014. Sleep efficiency was 87.6% with a latency to sleep of 14.5 minutes and wake after sleep onset of 46.5 minutes with mild to moderate sleep fragmentation noted. Arousal index was mildly elevated. He had an increased percentage of light stage sleep, nearly 5% of deep sleep, and a decreased percentage of dream sleep at only 3.2%. Latency to REM sleep was very prolonged at 350.5 minutes. Moderate  periodic leg movements were noted at 36.8 per hour, resulting in an arousal index of 4.2 per hour. Rare PVCs were noted and overall his EKG looked much improved from his original sleep study from 04/29/2014. He had a total of one central apnea, 19 obstructive hypopneas and one mixed hypopneas for the study. Baseline oxygen saturation was much improved at 95% with a nadir of 80%. On the final setting his oxygen saturation remained above 90%. Snoring was eliminated. BiPAP ST was started at 10/6 cm with a rate of 10 using a large fullface mask. A chin strap was needed due to residual mouth opening. BiPAP was titrated to a pressure of 22/18 with a rate of 12 per minute. On this final setting his AHI was 0.8 per hour.   I reviewed his compliance  data from 05/21/2014 through 06/19/2014 which is a total of 30 days during which time he used his machine every night, percent used days greater than 4 hours was 90%, indicating excellent compliance, settings of 22/18 with a rate of 12 with a residual AHI of 6.9 per hour but leak was at times high with the 95th percentile 42.3 L per minute. Average usage of 6 hours and 11 minutes. More recently in the last 10-14 days his sleep had improved. In the interim he had a LHC on 06/17/14.     His Past Medical History Is Significant For: Past Medical History  Diagnosis Date  . Hypertension   . Benign prostatic hypertrophy     takes Flomax daily  . ED (erectile dysfunction)   . Complication of anesthesia     hard to wake up  . Hyperlipidemia   . Sleep apnea     sleep study done 20+yrs ago  . Arthritis     bil knees  . Anxiety     takes Celexa  . Spinal headache   . PONV (postoperative nausea and vomiting)   . Diabetes mellitus without complication   . BPH with urinary obstruction 06/25/2013    127cc prostate with large middle lobe and bladder stones.   . Bladder stones 06/25/2013    1.9cm bladder stone.    His Past Surgical History Is Significant For: Past  Surgical History  Procedure Laterality Date  . Colonoscopy    . Knee arthroscopy  09/07/2010    left knee  . Cataract extraction w/ intraocular lens  implant, bilateral  03/13/2003  . Retinal detachment repair w/ scleral buckle le  10/14/2002    Dr Zadie Rhine  . Knee arthroscopy  05/27/2000    left knee  . Tonsillectomy      age 16  . Vasectomy  1982  . Total knee arthroplasty  08/01/2011    Procedure: TOTAL KNEE ARTHROPLASTY;  Surgeon: Lorn Junes, MD;  Location: [place];  Service: Orthopedics;  Laterality: Right;  TOTAL KNEE ARTHROPLASTY  RIGHT SIDE  . Cystoscopy with litholapaxy N/A 06/25/2013    Procedure: CYSTOSCOPY WITH LITHOLAPAXY;  Surgeon: [name], MD;  Location: [facility];  Service: Urology;  Laterality: N/A;  . Transurethral resection of prostate N/A 06/25/2013    Procedure: TRANSURETHRAL RESECTION OF THE PROSTATE WITH GYRUS INSTRUMENTS;  Surgeon: [name], MD;  Location: [facility];  Service: Urology;  Laterality: N/A;  . Cardiac catheterization  06-15-14  . Left heart catheterization with coronary angiogram N/A 06/17/2014    Procedure: LEFT HEART CATHETERIZATION WITH CORONARY ANGIOGRAM;  Surgeon: Laverda Page, MD;  Location: [facility];  Service: Cardiovascular;  Laterality: N/A;    His Family History Is Significant For: Family History  Problem Relation Age of Onset  . Arthritis Other   . Cancer Other     breast, skin  . Coronary artery disease Other   . Heart disease Other   . Hypertension Mother   . Heart failure Father   . Anesthesia problems Neg Hx   . Hypotension Neg Hx   . Malignant hyperthermia Neg Hx   . Pseudochol deficiency Neg Hx     His Social History Is Significant For: History   Social History  . Marital Status: Married    Spouse Name: N/A  . Number of Children: N/A  . Years of Education: N/A   Social History Main Topics  . Smoking status: Never Smoker   . Smokeless tobacco: Not on  file  . Alcohol Use: No  . Drug Use: No  . Sexual  Activity: Not on file   Other Topics Concern  . None   Social History Narrative   Right handed.     His Allergies Are:  Allergies  Allergen Reactions  . Penicillins Anaphylaxis    "eyes swell shut"  :   His Current Medications Are:  Outpatient Encounter Prescriptions as of 12/23/2014  Medication Sig  . aspirin EC 81 MG tablet Take 81 mg by mouth daily.  Marland Kitchen atorvastatin (LIPITOR) 20 MG tablet Take 20 mg by mouth daily.  . celecoxib (CELEBREX) 200 MG capsule   . Cholecalciferol (VITAMIN D) 2000 UNITS tablet Take 2,000 Units by mouth daily.  . citalopram (CELEXA) 40 MG tablet Take 40 mg by mouth daily.  . Dapagliflozin Propanediol (FARXIGA) 10 MG TABS Take 10 mg by mouth daily.  Marland Kitchen lisinopril (PRINIVIL,ZESTRIL) 10 MG tablet Take 10 mg by mouth daily.  . meloxicam (MOBIC) 15 MG tablet Take 15 mg by mouth daily.  . metFORMIN (GLUCOPHAGE) 500 MG tablet Take by mouth 2 (two) times daily with a meal.  . metoprolol tartrate (LOPRESSOR) 25 MG tablet Take 25 mg by mouth 2 (two) times daily.  Marland Kitchen NITROSTAT 0.4 MG SL tablet   . senna-docusate (SENOKOT-S) 8.6-50 MG per tablet Take 1 tablet by mouth daily as needed for mild constipation.  . tamsulosin (FLOMAX) 0.4 MG CAPS capsule   :  Review of Systems:  Out of a complete 14 point review of systems, all are reviewed and negative with the exception of these symptoms as listed below:   Review of Systems  All other systems reviewed and are negative.   Objective:  Neurologic Exam  Physical Exam Physical Examination:   Filed Vitals:   12/23/14 0833  BP: 131/73  Pulse: 58  Resp: 18    General Examination: The patient is a very pleasant 69 y.o. male in no acute distress. He appears well-developed and well-nourished and well groomed.   HEENT: Normocephalic, atraumatic, pupils are equal, round and reactive to light and accommodation. Funduscopic exam is normal with sharp disc margins noted. Extraocular tracking is good without limitation  to gaze excursion or nystagmus noted. Normal smooth pursuit is noted. Hearing is grossly intact. Face is symmetric with normal facial animation and normal facial sensation. Speech is clear with no dysarthria noted. There is no hypophonia. There is no lip, neck/head, jaw or voice tremor. Neck is supple with full range of passive and active motion. There are no carotid bruits on auscultation. Oropharynx exam reveals: moderate mouth dryness, adequate dental hygiene and moderate airway crowding, due to redundant soft palate. Mallampati is class II. Tongue protrudes centrally and palate elevates symmetrically. Tonsils are absent. He has a Mild to moderate overbite.   Chest: Clear to auscultation without wheezing, rhonchi or crackles noted.  Heart: S1+S2+0, regular and normal without murmurs, rubs or gallops noted.   Abdomen: Soft, non-tender and non-distended with normal bowel sounds appreciated on auscultation.  Extremities: There is trace pitting edema in the distal lower extremities bilaterally, mainly on the L. Pedal pulses are intact.  Skin: Warm and dry without trophic changes noted. There are no varicose veins except mild on the L.  Musculoskeletal: exam reveals no obvious joint deformities, tenderness or joint swelling or erythema. He had R knee replacement.   Neurologically:  Mental status: The patient is awake, alert and oriented in all 4 spheres. His immediate and remote memory, attention, language skills  and fund of knowledge are appropriate. There is no evidence of aphasia, agnosia, apraxia or anomia. Speech is clear with normal prosody and enunciation. Thought process is linear. Mood is normal and affect is normal.  Cranial nerves II - XII are as described above under HEENT exam. In addition: shoulder shrug is normal with equal shoulder height noted. Motor exam: Normal bulk, strength and tone is noted. There is no drift, tremor or rebound. Romberg is negative. Reflexes are 2+ throughout  with the exception of trace reflex of his in the right knee, which is s/p TKA. Fine motor skills are intact.  Cerebellar testing: No dysmetria or intention tremor on finger to nose testing. Heel to shin is unremarkable bilaterally, with the exception of mild difficulty on the right side due to his right knee pain. There is no truncal or gait ataxia.  Sensory exam: intact to light touch.  Gait, station and balance: He stands easily. No veering to one side is noted. No leaning to one side is noted. Posture is age-appropriate and stance is narrow based. Gait shows normal stride length and normal pace. No problems turning are noted. He turns en bloc. Tandem walk is mildly difficult for him, unchanged.             Assessment and Plan:  In summary, Keith [name]. is a very pleasant 69 year old male with an underlying medical history of obesity, BPH, bladder stones, type 2 diabetes, degenerative joint disease and chronic back pain, hyperlipidemia, chronic rhinitis, depression, and migraine headaches, who was diagnosed with obstructive sleep apnea over 25 years ago and his most recent sleep study with [place] in August 2015 demonstrated primary central apnea and obstructive sleep disordered breathing which we treated with BiPAP ST. He also came back for another BiPAP titration study in September 2015 to optimize treatment setting and mask fitting. He has been compliant with treatment overall. Recently he had a cough for which he took over-the-counter medication and could not use his BiPAP at the time. He has been using his old type of full face mask which we were able to replace for him as well. His physical exam is stable. He has had good results overall and is pleased with how he is doing. His nocturia improved by about 50%. He is commended for his treatment adherence. Leak has been low in general. At this juncture, he is doing fairly well again. His residual AHI is borderline at this time but was better in the past  around 3.6. This was with more closer treatment adherence. He is getting back on track with that and this will likely improve. Leak is borderline but was better in the past around 15 for the 95th percentile which is now around 23 for the 95th percentile, again, with better compliance I think numbers will settle back to where they were before. within therapeutic range. At this point him back in about 6 months, sooner if need be. I answered all his questions today and he was in agreement.  I spent 15 minutes in total face-to-face time with the patient, more than 50% of which was spent in counseling and coordination of care, reviewing test results, reviewing medication and discussing or reviewing the diagnosis of CSA and OSA, the prognosis and treatment options.

## 2014-12-23 NOTE — Patient Instructions (Signed)
Keep up the good work! I will see you back in 6 months for sleep apnea check up, and if you continue to do well on CPAP I will see you once a year thereafter.

## 2015-01-19 DIAGNOSIS — N138 Other obstructive and reflux uropathy: Secondary | ICD-10-CM | POA: Diagnosis not present

## 2015-01-19 DIAGNOSIS — N200 Calculus of kidney: Secondary | ICD-10-CM | POA: Diagnosis not present

## 2015-01-19 DIAGNOSIS — N401 Enlarged prostate with lower urinary tract symptoms: Secondary | ICD-10-CM | POA: Diagnosis not present

## 2015-01-19 DIAGNOSIS — R351 Nocturia: Secondary | ICD-10-CM | POA: Diagnosis not present

## 2015-01-19 DIAGNOSIS — N5201 Erectile dysfunction due to arterial insufficiency: Secondary | ICD-10-CM | POA: Diagnosis not present

## 2015-01-20 ENCOUNTER — Other Ambulatory Visit: Payer: Self-pay | Admitting: Urology

## 2015-01-21 ENCOUNTER — Encounter (HOSPITAL_COMMUNITY): Payer: Self-pay | Admitting: *Deleted

## 2015-01-21 DIAGNOSIS — J309 Allergic rhinitis, unspecified: Secondary | ICD-10-CM | POA: Diagnosis not present

## 2015-01-21 DIAGNOSIS — Z6837 Body mass index (BMI) 37.0-37.9, adult: Secondary | ICD-10-CM | POA: Diagnosis not present

## 2015-01-21 DIAGNOSIS — J029 Acute pharyngitis, unspecified: Secondary | ICD-10-CM | POA: Diagnosis not present

## 2015-01-22 ENCOUNTER — Encounter (HOSPITAL_COMMUNITY)
Admission: RE | Admit: 2015-01-22 | Discharge: 2015-01-22 | Disposition: A | Discharge status: Home / Self Care | Payer: Medicare Other | Source: Ambulatory Visit | Attending: Urology | Admitting: Urology

## 2015-01-22 DIAGNOSIS — N200 Calculus of kidney: Secondary | ICD-10-CM | POA: Diagnosis not present

## 2015-01-22 DIAGNOSIS — Z01810 Encounter for preprocedural cardiovascular examination: Secondary | ICD-10-CM | POA: Diagnosis not present

## 2015-01-23 NOTE — H&P (Signed)
Active Problems Problems  1. Benign prostatic hyperplasia with urinary obstruction (N40.1,N13.8) 2. Erectile dysfunction due to arterial insufficiency (N52.01) 3. Increased urinary frequency (R35.0) 4. Nephrolithiasis (N20.0) 5. Nocturia (R35.1) 6. Prostate cancer screening (Z12.5) 7. Pyuria (N39.0) 8. Urinary urgency (R39.15)  History of Present Illness Keith [name] returns today in f/u his history of stones.  He had some right back pain in April with some nausea and a low grade temp. He had another episode late last summer. I saw him last July after a right ureteral stone episode that we thought he had passed. He is voiding with some increased frequency with nocturia 3-4x. The nocturia has increased over the last year. He had a TURP and cystolithalopaxy in 2014. He has a good stream some of the time. He feels like he is emptying. He has had no hematuria.  He has increased his fluid intake since his surgery. The bladder stones were uric acid 80% and CaOx 20%.   Past Medical History Problems  1. History of Bladder calculus (N21.0) 2. History of Gross hematuria (R31.0) 3. History of diabetes mellitus (Z86.39) 4. History of kidney stones (Z87.442) 5. History of nonmelanoma skin cancer (Z85.828) 6. History of phimosis (Z87.438) 7. History of right flank pain (Z87.898) 8. History of Obstructive Sleep Apnea  Surgical History Problems  1. History of Cataract Surgery 2. History of Cystoscopy With Fragmentation Of Bladder Calculus Over 2.5cm 3. History of Eye Surgery Results Retina Reattached 4. History of Knee Surgery 5. History of Transurethral Resection Of Prostate (TURP)  Current Meds 1. Aspirin 81 MG TABS; Take 1 tablet daily;  Therapy: (Recorded:14Jul2009) to Recorded 2. CeleBREX 200 MG Oral Capsule;  Therapy: [id number] to Recorded 3. Cialis 20 MG Oral Tablet;  Therapy: [id number] to Recorded 4. Citalopram Hydrobromide 40 MG Oral Tablet;  Therapy: [id number] to Recorded 5. Fish Oil  CAPS;  Therapy: (Recorded:18Aug2014) to Recorded 6. Glucosamine CAPS;  Therapy: (Recorded:14Jul2009) to Recorded 7. MetFORMIN HCl - 1000 MG Oral Tablet;  Therapy: 30Jun2014 to Recorded 8. Stendra 200 MG Oral Tablet; take 1/2 -1  tab po prn;  Therapy: [id number] to (Last Rx:04Mar2015)  Requested for: [id number] Ordered  Allergies Medication  1. Penicillins  Family History Problems  1. Family history of Family Health Status Number Of Children   1 child 2. Family history of Father Deceased At Age ____ : Father   Died at 25 from CHF 3. Family history of Mother Deceased At Age ____ : Mother   Died at 97 from CHF  Social History Problems  1. Alcohol Use   occassionally 2. Denied: History of Caffeine Use 3. Marital History - Currently Married 4. Never A Smoker 5. Occupation: Retired  Electronics engineer, constitutional, skin, eye, otolaryngeal, hematologic/lymphatic, cardiovascular, pulmonary, endocrine, musculoskeletal, gastrointestinal, neurological and psychiatric system(s) were reviewed and pertinent findings if present are noted and are otherwise negative.  Genitourinary: nocturia and erectile dysfunction.  Gastrointestinal: no nausea and no flank pain.  Cardiovascular: chest pain (occasionally but he had a negative cath 6-8 months ago by Dr. [name]. ) and leg swelling (mild).    Vitals Vital Signs [Data Includes: Last 1 Day]  Recorded: [id number] 09:21AM  Blood Pressure: 147 / 70 Temperature: 97.2 F Heart Rate: 66  Physical Exam Constitutional: Well nourished and well developed . No acute distress.  Pulmonary: No respiratory distress and normal respiratory rhythm and effort.  Cardiovascular: Heart rate and rhythm are normal. Mild ankle edema right > left.    Results/Data Urine Keith [name] Includes:  Last 1 Day]   [id number]  COLOR YELLOW   APPEARANCE CLEAR   SPECIFIC GRAVITY 1.020   pH 5.5   GLUCOSE NEG mg/dL  BILIRUBIN NEG   KETONE NEG mg/dL  BLOOD TRACE    PROTEIN NEG mg/dL  UROBILINOGEN 0.2 mg/dL  NITRITE NEG   LEUKOCYTE ESTERASE NEG   SQUAMOUS EPITHELIAL/HPF FEW   WBC 0-2 WBC/hpf  RBC 0-2 RBC/hpf  BACTERIA RARE   CRYSTALS NONE SEEN   CASTS NONE SEEN    The following images/tracing/specimen were independently visualized:  KUB today shows interval growth in the LLP stones which are now 61mm and 21mm. There are no ureteral or bladder stones. He has no other significant bone, gas or soft tissue abnormalities.    Assessment Assessed  1. Nephrolithiasis (N20.0) 2. Erectile dysfunction due to arterial insufficiency (N52.01) 3. Benign prostatic hyperplasia with urinary obstruction (N40.1,N13.8) 4. Nocturia (123456)  He has metabolically active left renal stones with significant interval growth over the past year.  He has increased nocturia but has increased his fluid intake and has some ankle edema.   Plan Health Maintenance  1. UA With REFLEX; [Do Not Release]; Status:Resulted - Requires Verification;   [id number] 09:03AM Nephrolithiasis  2. Follow-up Schedule Surgery Office  Follow-up  Status: Hold For - Appointment   Requested for: [id number]  With the stone growth he needs treatment and I reviewed PCNL, ureteroscopy and ESWL and I think he would be most appropriately managed initially with ESWL. I reviewed the risks of bleeding, infection, injury to the kidney or adjacent organs which can rarely be severe or lift threatening, obstructing fragments with need for secondary procedures, thrombotic events, cardiac arrhythmias and sedation complications. He will need to bring his CPAP and I will have him cleared by Dr. [name].   I discussed the impact of the fluid intake and ankle edema on his nocturia and suggested he discuss that with Dr. [name].   Discussion/Summary CC: Dr. [name] and Dr. [name].   Signatures Electronically signed by : [name], M.D.; Jan 19 2015 10:00AM EST

## 2015-01-26 ENCOUNTER — Encounter (HOSPITAL_COMMUNITY): Payer: Self-pay | Admitting: *Deleted

## 2015-01-26 ENCOUNTER — Ambulatory Visit (HOSPITAL_COMMUNITY): Payer: Medicare Other

## 2015-01-26 ENCOUNTER — Ambulatory Visit (HOSPITAL_COMMUNITY)
Admission: RE | Admit: 2015-01-26 | Discharge: 2015-01-26 | Disposition: A | Discharge status: Home / Self Care | Payer: Medicare Other | Source: Ambulatory Visit | Attending: Urology | Admitting: Urology

## 2015-01-26 ENCOUNTER — Encounter (HOSPITAL_COMMUNITY)
Admission: RE | Disposition: A | Discharge status: Home / Self Care | Payer: Self-pay | Source: Ambulatory Visit | Attending: Urology

## 2015-01-26 DIAGNOSIS — I10 Essential (primary) hypertension: Secondary | ICD-10-CM | POA: Insufficient documentation

## 2015-01-26 DIAGNOSIS — N529 Male erectile dysfunction, unspecified: Secondary | ICD-10-CM | POA: Diagnosis not present

## 2015-01-26 DIAGNOSIS — N138 Other obstructive and reflux uropathy: Secondary | ICD-10-CM | POA: Insufficient documentation

## 2015-01-26 DIAGNOSIS — R3915 Urgency of urination: Secondary | ICD-10-CM | POA: Diagnosis not present

## 2015-01-26 DIAGNOSIS — E669 Obesity, unspecified: Secondary | ICD-10-CM | POA: Insufficient documentation

## 2015-01-26 DIAGNOSIS — Z79899 Other long term (current) drug therapy: Secondary | ICD-10-CM | POA: Insufficient documentation

## 2015-01-26 DIAGNOSIS — G4733 Obstructive sleep apnea (adult) (pediatric): Secondary | ICD-10-CM | POA: Diagnosis not present

## 2015-01-26 DIAGNOSIS — E119 Type 2 diabetes mellitus without complications: Secondary | ICD-10-CM | POA: Diagnosis not present

## 2015-01-26 DIAGNOSIS — Z7982 Long term (current) use of aspirin: Secondary | ICD-10-CM | POA: Diagnosis not present

## 2015-01-26 DIAGNOSIS — Z6835 Body mass index (BMI) 35.0-35.9, adult: Secondary | ICD-10-CM | POA: Insufficient documentation

## 2015-01-26 DIAGNOSIS — Z01818 Encounter for other preprocedural examination: Secondary | ICD-10-CM | POA: Diagnosis not present

## 2015-01-26 DIAGNOSIS — N200 Calculus of kidney: Secondary | ICD-10-CM | POA: Diagnosis not present

## 2015-01-26 DIAGNOSIS — Z87442 Personal history of urinary calculi: Secondary | ICD-10-CM | POA: Diagnosis not present

## 2015-01-26 DIAGNOSIS — N401 Enlarged prostate with lower urinary tract symptoms: Secondary | ICD-10-CM | POA: Insufficient documentation

## 2015-01-26 DIAGNOSIS — Z85828 Personal history of other malignant neoplasm of skin: Secondary | ICD-10-CM | POA: Diagnosis not present

## 2015-01-26 DIAGNOSIS — I771 Stricture of artery: Secondary | ICD-10-CM | POA: Insufficient documentation

## 2015-01-26 DIAGNOSIS — R351 Nocturia: Secondary | ICD-10-CM | POA: Insufficient documentation

## 2015-01-26 DIAGNOSIS — R350 Frequency of micturition: Secondary | ICD-10-CM | POA: Diagnosis not present

## 2015-01-26 HISTORY — DX: Angina pectoris, unspecified: I20.9

## 2015-01-26 LAB — GLUCOSE, CAPILLARY: Glucose-Capillary: 150 mg/dL — ABNORMAL HIGH (ref 65–99)

## 2015-01-26 SURGERY — LITHOTRIPSY, ESWL
Anesthesia: LOCAL | Laterality: Left

## 2015-01-26 MED ORDER — CIPROFLOXACIN HCL 500 MG PO TABS
500.0000 mg | ORAL_TABLET | ORAL | Status: AC
  Administered 2015-01-26: 500 mg via ORAL
  Filled 2015-01-26: qty 1

## 2015-01-26 MED ORDER — SODIUM CHLORIDE 0.9 % IV SOLN
INTRAVENOUS | Status: DC
  Administered 2015-01-26: 07:00:00 via INTRAVENOUS

## 2015-01-26 MED ORDER — DIAZEPAM 5 MG PO TABS
10.0000 mg | ORAL_TABLET | ORAL | Status: AC
  Administered 2015-01-26: 10 mg via ORAL
  Filled 2015-01-26: qty 2

## 2015-01-26 MED ORDER — HYDROCODONE-ACETAMINOPHEN 5-325 MG PO TABS: 1.0000 | ORAL_TABLET | Freq: Four times a day (QID) | ORAL | Status: DC | PRN

## 2015-01-26 MED ORDER — DIPHENHYDRAMINE HCL 25 MG PO CAPS
25.0000 mg | ORAL_CAPSULE | ORAL | Status: AC
  Administered 2015-01-26: 25 mg via ORAL
  Filled 2015-01-26: qty 1

## 2015-01-26 NOTE — Interval H&P Note (Signed)
History and Physical Interval Note:  01/26/2015 8:20 AM  Keith [name].  has presented today for surgery, with the diagnosis of left renal stone  The various methods of treatment have been discussed with the patient and family. After consideration of risks, benefits and other options for treatment, the patient has consented to  Procedure([name]): LEFT EXTRACORPOREAL SHOCK WAVE LITHOTRIPSY (ESWL) (Left) as a surgical intervention .  The patient'[name] history has been reviewed, patient examined, no change in status, stable for surgery.  I have reviewed the patient'[name] chart and labs.  Questions were answered to the patient'[name] satisfaction.     Keith [name]

## 2015-01-26 NOTE — Op Note (Signed)
See [facility] OP note scanned into chart. 

## 2015-01-26 NOTE — Discharge Instructions (Addendum)
See [facility] discharge instructions in chart.    May restart aspirin in 5 days     Lithotripsy for Kidney Stones Lithotripsy is a treatment that can sometimes help eliminate kidney stones and pain that they cause. A form of lithotripsy, also known as extracorporeal shock wave lithotripsy, is a nonsurgical procedure that helps your body rid itself of the kidney stone when it is too big to pass on its own. Extracorporeal shock wave lithotripsy is a method of crushing a kidney stone with shock waves. These shock waves pass through your body and are focused on your stone. They cause the kidney stones to crumble while still in the urinary tract. It is then easier for the smaller pieces of stone to pass in the urine. Lithotripsy usually takes about an hour. It is done in a hospital, a lithotripsy center, or a mobile unit. It usually does not require an overnight stay. Your health care provider will instruct you on preparation for the procedure. Your health care provider will tell you what to expect afterward. LET [facility] CARE PROVIDER KNOW ABOUT:  Any allergies you have.  All medicines you are taking, including vitamins, herbs, eye drops, creams, and over-the-counter medicines.  Previous problems you or members of your family have had with the use of anesthetics.  Any blood disorders you have.  Previous surgeries you have had.  Medical conditions you have. RISKS AND COMPLICATIONS Generally, lithotripsy for kidney stones is a safe procedure. However, as with any procedure, complications can occur. Possible complications include:  Infection.  Bleeding of the kidney.  Bruising of the kidney or skin.  Obstruction of the ureter.  Failure of the stone to fragment. BEFORE THE PROCEDURE  Do not eat or drink for 6-8 hours prior to the procedure. You may, however, take the medications with a sip of water that your physician instructs you to take  Do not take aspirin or  aspirin-containing products for 7 days prior to your procedure  Do not take nonsteroidal anti-inflammatory products for 7 days prior to your procedure PROCEDURE A stent (flexible tube with holes) may be placed in your ureter. The ureter is the tube that transports the urine from the kidneys to the bladder. Your health care provider may place a stent before the procedure. This will help keep urine flowing from the kidney if the fragments of the stone block the ureter. You may have an IV tube placed in one of your veins to give you fluids and medicines. These medicines may help you relax or make you sleep. During the procedure, you will lie comfortably on a fluid-filled cushion or in a warm-water bath. After an X-ray or ultrasound exam to locate your stone, shock waves are aimed at the stone. If you are awake, you may feel a tapping sensation as the shock waves pass through your body. If large stone particles remain after treatment, a second procedure may be necessary at a later date. For comfort during the test:  Relax as much as possible.  Try to remain still as much as possible.  Try to follow instructions to speed up the test.  Let your health care provider know if you are uncomfortable, anxious, or in pain. AFTER THE PROCEDURE  After surgery, you will be taken to the recovery area. A nurse will watch and check your progress. Once you're awake, stable, and taking fluids well, you will be allowed to go home as long as there are no problems. You will also  be allowed to pass your urine before discharge.You may be given antibiotics to help prevent infection. You may also be prescribed pain medicine if needed. In a week or two, your health care provider may remove your stent, if you have one. You may first have an X-ray exam to check on how successful the fragmentation of your stone has been and how much of the stone has passed. Your health care provider will check to see whether or not stone particles  remain. SEEK IMMEDIATE MEDICAL CARE IF:  You develop a fever or shaking chills.  Your pain is not relieved by medicine.  You feel sick to your stomach (nauseated) and you vomit.  You develop heavy bleeding.  You have difficulty urinating.  You start to pass your stent from your penis. Document Released: 08/26/2000 Document Revised: 06/19/2013 Document Reviewed: 03/14/2013 [facility] Patient Information 2015 [place], [place]. This information is not intended to replace advice given to you by your health care provider. Make sure you discuss any questions you have with your health care provider.

## 2015-01-29 DIAGNOSIS — E1129 Type 2 diabetes mellitus with other diabetic kidney complication: Secondary | ICD-10-CM | POA: Diagnosis not present

## 2015-01-29 DIAGNOSIS — Z6836 Body mass index (BMI) 36.0-36.9, adult: Secondary | ICD-10-CM | POA: Diagnosis not present

## 2015-01-29 DIAGNOSIS — I2510 Atherosclerotic heart disease of native coronary artery without angina pectoris: Secondary | ICD-10-CM | POA: Diagnosis not present

## 2015-01-29 DIAGNOSIS — G4733 Obstructive sleep apnea (adult) (pediatric): Secondary | ICD-10-CM | POA: Diagnosis not present

## 2015-02-10 DIAGNOSIS — N390 Urinary tract infection, site not specified: Secondary | ICD-10-CM | POA: Diagnosis not present

## 2015-02-10 DIAGNOSIS — N200 Calculus of kidney: Secondary | ICD-10-CM | POA: Diagnosis not present

## 2015-02-10 DIAGNOSIS — R312 Other microscopic hematuria: Secondary | ICD-10-CM | POA: Diagnosis not present

## 2015-03-09 ENCOUNTER — Other Ambulatory Visit: Payer: Self-pay

## 2015-03-18 DIAGNOSIS — N202 Calculus of kidney with calculus of ureter: Secondary | ICD-10-CM | POA: Diagnosis not present

## 2015-03-18 DIAGNOSIS — R3915 Urgency of urination: Secondary | ICD-10-CM | POA: Diagnosis not present

## 2015-03-18 DIAGNOSIS — N200 Calculus of kidney: Secondary | ICD-10-CM | POA: Diagnosis not present

## 2015-03-25 ENCOUNTER — Other Ambulatory Visit: Payer: Self-pay | Admitting: Family Medicine

## 2015-03-25 DIAGNOSIS — N200 Calculus of kidney: Secondary | ICD-10-CM | POA: Diagnosis not present

## 2015-03-26 ENCOUNTER — Ambulatory Visit: Payer: Medicare Other | Admitting: Neurology

## 2015-04-21 DIAGNOSIS — N200 Calculus of kidney: Secondary | ICD-10-CM | POA: Diagnosis not present

## 2015-04-22 DIAGNOSIS — I2510 Atherosclerotic heart disease of native coronary artery without angina pectoris: Secondary | ICD-10-CM | POA: Diagnosis not present

## 2015-04-22 DIAGNOSIS — G4733 Obstructive sleep apnea (adult) (pediatric): Secondary | ICD-10-CM | POA: Diagnosis not present

## 2015-04-22 DIAGNOSIS — I10 Essential (primary) hypertension: Secondary | ICD-10-CM | POA: Diagnosis not present

## 2015-04-22 DIAGNOSIS — Z6837 Body mass index (BMI) 37.0-37.9, adult: Secondary | ICD-10-CM | POA: Diagnosis not present

## 2015-04-22 DIAGNOSIS — E785 Hyperlipidemia, unspecified: Secondary | ICD-10-CM | POA: Diagnosis not present

## 2015-04-22 DIAGNOSIS — E1121 Type 2 diabetes mellitus with diabetic nephropathy: Secondary | ICD-10-CM | POA: Diagnosis not present

## 2015-05-12 DIAGNOSIS — Z23 Encounter for immunization: Secondary | ICD-10-CM | POA: Diagnosis not present

## 2015-06-24 ENCOUNTER — Ambulatory Visit (INDEPENDENT_AMBULATORY_CARE_PROVIDER_SITE_OTHER): Payer: Medicare Other | Admitting: Neurology

## 2015-06-24 ENCOUNTER — Encounter: Payer: Self-pay | Admitting: Neurology

## 2015-06-24 VITALS — BP 128/76 | HR 70 | Resp 18 | Ht 72.0 in | Wt 260.0 lb

## 2015-06-24 DIAGNOSIS — G4731 Primary central sleep apnea: Secondary | ICD-10-CM | POA: Diagnosis not present

## 2015-06-24 DIAGNOSIS — G4761 Periodic limb movement disorder: Secondary | ICD-10-CM | POA: Diagnosis not present

## 2015-06-24 DIAGNOSIS — G4733 Obstructive sleep apnea (adult) (pediatric): Secondary | ICD-10-CM | POA: Diagnosis not present

## 2015-06-24 NOTE — Progress Notes (Signed)
Subjective:    Patient ID: Keith [name]. is a 69 y.o. male.  HPI      Interim history:   Mr. Lewinski is a very pleasant 69 year old right-handed gentleman with an underlying medical history of obesity, BPH, bladder stones, type 2 diabetes, degenerative joint disease and chronic back pain, hyperlipidemia, chronic rhinitis, depression, and migraine headaches, who presents for followup consultation of his severe complex sleep apnea with a primary central component. The patient is unaccompanied today. I last saw him on 12/23/2014, at which time he reported that he was not using his BiPAP regularly secondary to a recent cough. He had increased his compliance however. He had some dryness of mouth. He still had nocturia about 3 times per night. He felt that this was actually better. Overall, he was doing fairly well. In the interim, he had left-sided lithotripsy for kidney stones on the left kidney on 01/26/2015.   Today, 06/24/2015: I reviewed his BiPAP compliance data from 05/24/2015 through 06/22/2015 which is a total of 30 days during which time he used his machine only 16 days with percent used days greater than 4 hours at only 13%, indicating poor compliance with an average usage of 3 hours and 17 minutes but overall daily usage of only 1 hour and 45 minutes, residual AHI at 1.7 per hour, leak acceptable for the 95th percentile at 12.8 L/m on a pressure of 22/18 with a rate of 12.  Today, 06/24/2015: He reports doing okay, wife had to have spine surgery at [facility] and he stayed with her for one week and also in her rehabilitation off and on so he could not use his BiPAP regularly at the time. Otherwise he is doing well with it. He sleeps better with it and tolerates the pressures. He was started on a new diabetes medication and has been able to lose a few pounds, almost 10 pounds and blood sugar values are little better as well. His A1c was still above 7 though.  Previously:   I saw him on  09/25/2014, at which time he reported still some trouble tolerating his mask. He started using his old mask from years ago and I tried to order it. Overall, he felt reasonably well treated with BiPAP and he was compliant. I suggested a six-month follow-up.  I reviewed his BiPAP ST compliance data from 11/22/2014 through 12/21/2014 which is a total of 30 days during which time he used his mask only 18 days with percent used days greater than 4 hours at only 23%, indicating poor compliance and much worse compliance than before. Average usage for all nights of only 2 hours and 2 minutes, pressure at 22/18 cm with a rate of 12. Residual AHI borderline at 5.9 and leak for the most part acceptable with the 95th percentile at 23.3 L/m.    I saw him on 06/23/2014, at which time he reported using his BiPAP machine regularly. He had left heart catheterization and also went on a 2 day trip to a friend's house where there was no access to electricity in the room he was not using his BiPAP during his naps. He reported severe mouth dryness at times. His nocturia had improved about 50% he felt. He had some air leak issues from the mask which improved when he changed the size of the mask. He was using a fullface mask. I discussed a sleep study results at length last time. His physical exam was stable. I asked him to continue to use  his BiPAP regularly, also during his naps.  I reviewed his compliance data from 08/23/2014 through 09/21/2014 which is a total of 30 days during which time he used his BiPAP every night except for 3 nights. Percent used days greater than 4 hours was 80%, indicating very good compliance, average usage of 5 hours and 26 minutes, setting of BiPAP ST at 22/18 with a rate of 12, residual AHI acceptable at 3.6 per hour and leak acceptable with the 95th percentile at 15.1 L/m.  I first met him on 03/13/2014, at which time he reported a prior diagnosis with obstructive sleep apnea over 25 years ago. He  had been using CPAP off and on for years with some improvement in snoring and in his sleep reported. He reported purchasing his own CPAP machine and had brought it to his appointment last time, which is a ResMed S6, without a humidifier and he had a large [employer] FFM, as he is a mouth breather. As he had not had a reevaluation of his sleep apnea, I suggested he return for a sleep study. He had a split-night sleep study on 04/29/2014 as well as a full night titration study subsequently on 05/19/2014 and I went over his sleep study results with him in detail today. His split-night sleep study from 04/29/2014 showed a baseline sleep efficiency at 73.9% with a latency sleep of 11.5 minutes and wake after sleep onset of 32 minutes with moderate to severe sleep fragmentation noted. He had an elevated arousal index. He had an elevated percentage of stage I and stage II sleep, and absence of dream sleep. He had multifocal PVCs. He had a brief 6 beat run of what appeared to be ventricular tachycardia followed by multifocal PVCs. Severe PLMS were noted with very little arousals. He had a total of 1 obstructive to mixed in 57 central apneas as well as 42 obstructive and 14 central hypopneas with an elevated AHI of 56.6 per hour. Baseline oxygen saturation was 91%, nadir was 78%. He was therefore started on CPAP. This was started at 5 cm and increased to 6 cm then decreased again to 4 cm because he had ongoing central sleep disordered breathing. His AHI remained high. He had an increased and deepened dream sleep. His baseline oxygen saturation was slightly improved at 92%. Nadir was 81%. He was switched to BiPAP therapy first at 10/6 cm and titrated gradually. He still had significant central sleep apneas. He was switched to BiPAP ST with a final pressure of 16/12 at a rate of 10. However, on the final settings he still had an AHI of 11.7 per hour. I suggested he start BiPAP ST at home but was also advised to  return for a full night BiPAP ST titration for proper optimalization of his treatment settings. He had a BiPAP study on 05/19/2014. Sleep efficiency was 87.6% with a latency to sleep of 14.5 minutes and wake after sleep onset of 46.5 minutes with mild to moderate sleep fragmentation noted. Arousal index was mildly elevated. He had an increased percentage of light stage sleep, nearly 5% of deep sleep, and a decreased percentage of dream sleep at only 3.2%. Latency to REM sleep was very prolonged at 350.5 minutes. Moderate periodic leg movements were noted at 36.8 per hour, resulting in an arousal index of 4.2 per hour. Rare PVCs were noted and overall his EKG looked much improved from his original sleep study from 04/29/2014. He had a total of one central apnea, 19  obstructive hypopneas and one mixed hypopneas for the study. Baseline oxygen saturation was much improved at 95% with a nadir of 80%. On the final setting his oxygen saturation remained above 90%. Snoring was eliminated. BiPAP ST was started at 10/6 cm with a rate of 10 using a large fullface mask. A chin strap was needed due to residual mouth opening. BiPAP was titrated to a pressure of 22/18 with a rate of 12 per minute. On this final setting his AHI was 0.8 per hour.   I reviewed his compliance data from 05/21/2014 through 06/19/2014 which is a total of 30 days during which time he used his machine every night, percent used days greater than 4 hours was 90%, indicating excellent compliance, settings of 22/18 with a rate of 12 with a residual AHI of 6.9 per hour but leak was at times high with the 95th percentile 42.3 L per minute. Average usage of 6 hours and 11 minutes. More recently in the last 10-14 days his sleep had improved. In the interim he had a LHC on 06/17/14.     His Past Medical History Is Significant For: Past Medical History  Diagnosis Date  . Hypertension   . Benign prostatic hypertrophy     takes Flomax daily  . ED (erectile  dysfunction)   . Complication of anesthesia     hard to wake up  . Hyperlipidemia   . Arthritis     bil knees  . Anxiety     takes Celexa  . Spinal headache   . PONV (postoperative nausea and vomiting)   . Diabetes mellitus without complication ([place])   . BPH with urinary obstruction 06/25/2013    127cc prostate with large middle lobe and bladder stones.   . Bladder stones 06/25/2013    1.9cm bladder stone.  . Anginal pain ([place])   . Sleep apnea     uses bipap machine    His Past Surgical History Is Significant For: Past Surgical History  Procedure Laterality Date  . Colonoscopy    . Knee arthroscopy  09/07/2010    left knee  . Cataract extraction w/ intraocular lens  implant, bilateral  03/13/2003  . Retinal detachment repair w/ scleral buckle le  10/14/2002    Dr Zadie Rhine  . Knee arthroscopy  05/27/2000    left knee  . Tonsillectomy      age 41  . Vasectomy  1982  . Total knee arthroplasty  08/01/2011    Procedure: TOTAL KNEE ARTHROPLASTY;  Surgeon: Lorn Junes, MD;  Location: [facility];  Service: Orthopedics;  Laterality: Right;  TOTAL KNEE ARTHROPLASTY  RIGHT SIDE  . Cystoscopy with litholapaxy N/A 06/25/2013    Procedure: CYSTOSCOPY WITH LITHOLAPAXY;  Surgeon: [name], MD;  Location: [facility];  Service: Urology;  Laterality: N/A;  . Transurethral resection of prostate N/A 06/25/2013    Procedure: TRANSURETHRAL RESECTION OF THE PROSTATE WITH GYRUS INSTRUMENTS;  Surgeon: [name], MD;  Location: [facility];  Service: Urology;  Laterality: N/A;  . Cardiac catheterization  06-15-14  . Left heart catheterization with coronary angiogram N/A 06/17/2014    Procedure: LEFT HEART CATHETERIZATION WITH CORONARY ANGIOGRAM;  Surgeon: Laverda Page, MD;  Location: [facility] CATH LAB;  Service: Cardiovascular;  Laterality: N/A;    His Family History Is Significant For: Family History  Problem Relation Age of Onset  . Arthritis Other   . Cancer Other     breast, skin  . Coronary artery  disease Other   .  Heart disease Other   . Hypertension Mother   . Heart failure Father   . Anesthesia problems Neg Hx   . Hypotension Neg Hx   . Malignant hyperthermia Neg Hx   . Pseudochol deficiency Neg Hx     His Social History Is Significant For: Social History   Social History  . Marital Status: Married    Spouse Name: N/A  . Number of Children: N/A  . Years of Education: N/A   Social History Main Topics  . Smoking status: Never Smoker   . Smokeless tobacco: None  . Alcohol Use: No  . Drug Use: No  . Sexual Activity: Not Asked   Other Topics Concern  . None   Social History Narrative   Right handed.     His Allergies Are:  Allergies  Allergen Reactions  . Penicillins Anaphylaxis    "eyes swell shut"  :   His Current Medications Are:  Outpatient Encounter Prescriptions as of 06/24/2015  Medication Sig  . atorvastatin (LIPITOR) 20 MG tablet Take 20 mg by mouth daily.  . Cholecalciferol (VITAMIN D) 2000 UNITS tablet Take 2,000 Units by mouth daily.  . citalopram (CELEXA) 40 MG tablet Take 40 mg by mouth daily.  Marland Kitchen FARXIGA 10 MG TABS tablet   . HYDROcodone-acetaminophen (NORCO/VICODIN) 5-325 MG per tablet Take 1-2 tablets by mouth every 6 (six) hours as needed.  Marland Kitchen lisinopril (PRINIVIL,ZESTRIL) 10 MG tablet Take 10 mg by mouth daily.  . meloxicam (MOBIC) 15 MG tablet Take 15 mg by mouth daily.  . metFORMIN (GLUCOPHAGE) 1000 MG tablet Take 1,000 mg by mouth 2 (two) times daily with a meal.  . metoprolol tartrate (LOPRESSOR) 25 MG tablet Take 25 mg by mouth 2 (two) times daily.  Marland Kitchen NITROSTAT 0.4 MG SL tablet Place 0.4 mg under the tongue every 5 (five) minutes as needed for chest pain.   Marland Kitchen senna-docusate (SENOKOT-S) 8.6-50 MG per tablet Take 1 tablet by mouth daily as needed for mild constipation.   No facility-administered encounter medications on file as of 06/24/2015.  :  Review of Systems:  Out of a complete 14 point review of systems, all are reviewed and  negative with the exception of these symptoms as listed below:   Review of Systems  Neurological:       No new concerns, patient reports that he is doing well on Bi-PAP.     Objective:  Neurologic Exam  Physical Exam Physical Examination:   Filed Vitals:   06/24/15 1124  BP: 128/76  Pulse: 70  Resp: 18   General Examination: The patient is a very pleasant 69 y.o. male in no acute distress. He appears well-developed and well-nourished and well groomed.   HEENT: Normocephalic, atraumatic, pupils are equal, round and reactive to light and accommodation. Extraocular tracking is good without limitation to gaze excursion or nystagmus noted. Normal smooth pursuit is noted. Hearing is grossly intact. Face is symmetric with normal facial animation and normal facial sensation. Speech is clear with no dysarthria noted. There is no hypophonia. There is no lip, neck/head, jaw or voice tremor. Neck is supple with full range of passive and active motion. There are no carotid bruits on auscultation. Oropharynx exam reveals: moderate mouth dryness, adequate dental hygiene and moderate airway crowding, due to redundant soft palate. Mallampati is class II. Tongue protrudes centrally and palate elevates symmetrically. Tonsils are absent. He has a Mild to moderate overbite.   Chest: Clear to auscultation without wheezing, rhonchi or crackles noted.  Heart: S1+S2+0, regular and normal without murmurs, rubs or gallops noted.   Abdomen: Soft, non-tender and non-distended with normal bowel sounds appreciated on auscultation.  Extremities: There is no pitting edema in the distal lower extremities. Pedal pulses are intact.  Skin: Warm and dry without trophic changes noted. There are no varicose veins except mild on the L.  Musculoskeletal: exam reveals no obvious joint deformities, tenderness or joint swelling or erythema. He had R knee replacement.   Neurologically:  Mental status: The patient is awake,  alert and oriented in all 4 spheres. His immediate and remote memory, attention, language skills and fund of knowledge are appropriate. There is no evidence of aphasia, agnosia, apraxia or anomia. Speech is clear with normal prosody and enunciation. Thought process is linear. Mood is normal and affect is normal.  Cranial nerves II - XII are as described above under HEENT exam. In addition: shoulder shrug is normal with equal shoulder height noted. Motor exam: Normal bulk, strength and tone is noted. There is no drift, tremor or rebound. Romberg is negative. Reflexes are 2+ throughout with the exception of trace reflex of his in the right knee, which is s/p TKA. Fine motor skills are intact.  Cerebellar testing: No dysmetria or intention tremor on finger to nose testing. Sensory exam: intact to light touch.  Gait, station and balance: He stands easily. No veering to one side is noted. No leaning to one side is noted. Posture is age-appropriate and stance is narrow based. Gait shows normal stride length and normal pace. No problems turning are noted. He turns en bloc. Tandem walk is mildly difficult for him, unchanged from last time.             Assessment and Plan:  In summary, Keith [name]. is a very pleasant 69 year old male with an underlying medical history of obesity, BPH, bladder stones, type 2 diabetes, degenerative joint disease and chronic back pain, hyperlipidemia, chronic rhinitis, depression, and migraine headaches, who was diagnosed with obstructive sleep apnea over 25 years ago and his sleep study with [place] in August 2015 demonstrated primary central apnea and obstructive sleep disordered breathing which we treated with BiPAP ST. He also came back for another BiPAP titration study in September 2015 to optimize treatment setting and mask fitting. He has been compliant with treatment overall. Recently, he had lapses in treatment, due to staying in the hospital with his wife. He has since then  restarted using his BiPAP machine regularly and his more compliant in the last week or 10 days. He is encouraged to continue with treatment and he also indicates that he has been feeling better and sleeping well with it. His BiPAP settings are fine. Residual AHI low at 1.7 and leak is fairly low at this time.  I would like to see him back in about 6 months, sooner if need be. I answered all his questions today and he was in agreement.  I spent 15 minutes in total face-to-face time with the patient, more than 50% of which was spent in counseling and coordination of care, reviewing test results, reviewing medication and discussing or reviewing the diagnosis of CSA and OSA, the prognosis and treatment options.

## 2015-06-24 NOTE — Patient Instructions (Signed)
Keep using your BiPAP machine, settings are fine. Follow up in 6 months, sooner if needed.

## 2015-08-03 DIAGNOSIS — H33302 Unspecified retinal break, left eye: Secondary | ICD-10-CM | POA: Diagnosis not present

## 2015-08-03 DIAGNOSIS — H35371 Puckering of macula, right eye: Secondary | ICD-10-CM | POA: Diagnosis not present

## 2015-08-03 DIAGNOSIS — G453 Amaurosis fugax: Secondary | ICD-10-CM | POA: Diagnosis not present

## 2015-08-04 ENCOUNTER — Other Ambulatory Visit (HOSPITAL_COMMUNITY): Payer: Self-pay | Admitting: Internal Medicine

## 2015-08-04 ENCOUNTER — Ambulatory Visit (HOSPITAL_COMMUNITY)
Admission: RE | Admit: 2015-08-04 | Discharge: 2015-08-04 | Disposition: A | Discharge status: Home / Self Care | Payer: Medicare Other | Source: Ambulatory Visit | Attending: Vascular Surgery | Admitting: Vascular Surgery

## 2015-08-04 DIAGNOSIS — G453 Amaurosis fugax: Secondary | ICD-10-CM

## 2015-08-05 ENCOUNTER — Emergency Department (HOSPITAL_COMMUNITY)
Admission: EM | Admit: 2015-08-05 | Discharge: 2015-08-05 | Disposition: A | Discharge status: Home / Self Care | Payer: Medicare Other | Attending: Emergency Medicine | Admitting: Emergency Medicine

## 2015-08-05 ENCOUNTER — Encounter (HOSPITAL_COMMUNITY): Payer: Self-pay | Admitting: Emergency Medicine

## 2015-08-05 ENCOUNTER — Emergency Department (HOSPITAL_COMMUNITY): Payer: Medicare Other

## 2015-08-05 DIAGNOSIS — H547 Unspecified visual loss: Secondary | ICD-10-CM

## 2015-08-05 DIAGNOSIS — Z9981 Dependence on supplemental oxygen: Secondary | ICD-10-CM | POA: Diagnosis not present

## 2015-08-05 DIAGNOSIS — Z7901 Long term (current) use of anticoagulants: Secondary | ICD-10-CM | POA: Insufficient documentation

## 2015-08-05 DIAGNOSIS — Z7982 Long term (current) use of aspirin: Secondary | ICD-10-CM | POA: Insufficient documentation

## 2015-08-05 DIAGNOSIS — Z79899 Other long term (current) drug therapy: Secondary | ICD-10-CM | POA: Diagnosis not present

## 2015-08-05 DIAGNOSIS — N400 Enlarged prostate without lower urinary tract symptoms: Secondary | ICD-10-CM | POA: Insufficient documentation

## 2015-08-05 DIAGNOSIS — M170 Bilateral primary osteoarthritis of knee: Secondary | ICD-10-CM | POA: Diagnosis not present

## 2015-08-05 DIAGNOSIS — G4730 Sleep apnea, unspecified: Secondary | ICD-10-CM | POA: Insufficient documentation

## 2015-08-05 DIAGNOSIS — I10 Essential (primary) hypertension: Secondary | ICD-10-CM | POA: Diagnosis not present

## 2015-08-05 DIAGNOSIS — R51 Headache: Secondary | ICD-10-CM | POA: Diagnosis not present

## 2015-08-05 DIAGNOSIS — E119 Type 2 diabetes mellitus without complications: Secondary | ICD-10-CM | POA: Insufficient documentation

## 2015-08-05 DIAGNOSIS — Z9889 Other specified postprocedural states: Secondary | ICD-10-CM | POA: Diagnosis not present

## 2015-08-05 DIAGNOSIS — E785 Hyperlipidemia, unspecified: Secondary | ICD-10-CM | POA: Insufficient documentation

## 2015-08-05 DIAGNOSIS — G43109 Migraine with aura, not intractable, without status migrainosus: Secondary | ICD-10-CM | POA: Diagnosis not present

## 2015-08-05 DIAGNOSIS — Z8669 Personal history of other diseases of the nervous system and sense organs: Secondary | ICD-10-CM | POA: Diagnosis not present

## 2015-08-05 DIAGNOSIS — G43809 Other migraine, not intractable, without status migrainosus: Secondary | ICD-10-CM | POA: Diagnosis not present

## 2015-08-05 DIAGNOSIS — H538 Other visual disturbances: Secondary | ICD-10-CM | POA: Diagnosis not present

## 2015-08-05 DIAGNOSIS — F419 Anxiety disorder, unspecified: Secondary | ICD-10-CM | POA: Insufficient documentation

## 2015-08-05 DIAGNOSIS — Z791 Long term (current) use of non-steroidal anti-inflammatories (NSAID): Secondary | ICD-10-CM | POA: Diagnosis not present

## 2015-08-05 DIAGNOSIS — H5710 Ocular pain, unspecified eye: Secondary | ICD-10-CM | POA: Diagnosis not present

## 2015-08-05 LAB — CBG MONITORING, ED: Glucose-Capillary: 101 mg/dL — ABNORMAL HIGH (ref 65–99)

## 2015-08-05 LAB — URINALYSIS, ROUTINE W REFLEX MICROSCOPIC
Bilirubin Urine: NEGATIVE
Glucose, UA: >1000 mg/dL — AB
Ketones, ur: NEGATIVE mg/dL
Leukocytes, UA: NEGATIVE
Nitrite: NEGATIVE
Protein, ur: NEGATIVE mg/dL
Specific Gravity, Urine: 1.026 (ref 1.005–1.030)
pH: 5 (ref 5.0–8.0)

## 2015-08-05 LAB — BASIC METABOLIC PANEL
Anion gap: 9 (ref 5–15)
BUN: 27 mg/dL — ABNORMAL HIGH (ref 6–20)
CO2: 20 mmol/L — ABNORMAL LOW (ref 22–32)
Calcium: 9.3 mg/dL (ref 8.9–10.3)
Chloride: 109 mmol/L (ref 101–111)
Creatinine, Ser: 1.65 mg/dL — ABNORMAL HIGH (ref 0.61–1.24)
GFR calc Af Amer: 48 mL/min — ABNORMAL LOW (ref >60)
GFR calc non Af Amer: 41 mL/min — ABNORMAL LOW (ref >60)
Glucose, Bld: 85 mg/dL (ref 65–99)
Potassium: 4.7 mmol/L (ref 3.5–5.1)
Sodium: 138 mmol/L (ref 135–145)

## 2015-08-05 LAB — CBC
HCT: 38.1 % — ABNORMAL LOW (ref 39.0–52.0)
Hemoglobin: 12.5 g/dL — ABNORMAL LOW (ref 13.0–17.0)
MCH: 30.5 pg (ref 26.0–34.0)
MCHC: 32.8 g/dL (ref 30.0–36.0)
MCV: 92.9 fL (ref 78.0–100.0)
Platelets: 239 10*3/uL (ref 150–400)
RBC: 4.1 MIL/uL — ABNORMAL LOW (ref 4.22–5.81)
RDW: 13.3 % (ref 11.5–15.5)
WBC: 9.3 10*3/uL (ref 4.0–10.5)

## 2015-08-05 LAB — URINE MICROSCOPIC-ADD ON

## 2015-08-05 MED ORDER — TOPIRAMATE 25 MG PO TABS: 25.0000 mg | ORAL_TABLET | Freq: Two times a day (BID) | ORAL | Status: DC

## 2015-08-05 MED ORDER — TOPIRAMATE 25 MG PO TABS
25.0000 mg | ORAL_TABLET | Freq: Two times a day (BID) | ORAL | Status: DC
  Administered 2015-08-05: 25 mg via ORAL
  Filled 2015-08-05 (×2): qty 1

## 2015-08-05 NOTE — Consult Note (Signed)
NEURO HOSPITALIST CONSULT NOTE   Requestig physician: Dr. Audie Pinto   Reason for Consult: HA with visual changes.   HPI:                                                                                                                                          Keith [name]. is an 69 y.o. male with history of migraine HA in the past without neurological changes.  This Thursday he was watching TV and noted he had a pounding HA over his left eye, with this HA he first noted wavy lines in all visual fields then bright spots.  He then noted he lost vision in his right eye for abut 1 minute.  HE did close either eye and noted no changes in the left eye when he lost vision in  Right eye.  This resolved but returned on Friday night and Saturday night but with no loss ov vision.  He went to his eye MD who wanted a carotid doppler "to rule out a RVO but not sure if he was told he had  A RVO". Currently asymptomatic.   Past Medical History  Diagnosis Date  . Hypertension   . Benign prostatic hypertrophy     takes Flomax daily  . ED (erectile dysfunction)   . Complication of anesthesia     hard to wake up  . Hyperlipidemia   . Arthritis     bil knees  . Anxiety     takes Celexa  . Spinal headache   . PONV (postoperative nausea and vomiting)   . Diabetes mellitus without complication ([place])   . BPH with urinary obstruction 06/25/2013    127cc prostate with large middle lobe and bladder stones.   . Bladder stones 06/25/2013    1.9cm bladder stone.  . Anginal pain ([place])   . Sleep apnea     uses bipap machine    Past Surgical History  Procedure Laterality Date  . Colonoscopy    . Knee arthroscopy  09/07/2010    left knee  . Cataract extraction w/ intraocular lens  implant, bilateral  03/13/2003  . Retinal detachment repair w/ scleral buckle le  10/14/2002    Dr Zadie Rhine  . Knee arthroscopy  05/27/2000    left knee  . Tonsillectomy      age 26  . Vasectomy  1982  .  Total knee arthroplasty  08/01/2011    Procedure: TOTAL KNEE ARTHROPLASTY;  Surgeon: Lorn Junes, MD;  Location: [facility];  Service: Orthopedics;  Laterality: Right;  TOTAL KNEE ARTHROPLASTY  RIGHT SIDE  . Cystoscopy with litholapaxy N/A 06/25/2013    Procedure: CYSTOSCOPY WITH LITHOLAPAXY;  Surgeon: [name], MD;  Location: [facility];  Service: Urology;  Laterality: N/A;  .  Transurethral resection of prostate N/A 06/25/2013    Procedure: TRANSURETHRAL RESECTION OF THE PROSTATE WITH GYRUS INSTRUMENTS;  Surgeon: [name], MD;  Location: [facility];  Service: Urology;  Laterality: N/A;  . Cardiac catheterization  06-15-14  . Left heart catheterization with coronary angiogram N/A 06/17/2014    Procedure: LEFT HEART CATHETERIZATION WITH CORONARY ANGIOGRAM;  Surgeon: Laverda Page, MD;  Location: [facility];  Service: Cardiovascular;  Laterality: N/A;    Family History  Problem Relation Age of Onset  . Arthritis Other   . Cancer Other     breast, skin  . Coronary artery disease Other   . Heart disease Other   . Hypertension Mother   . Heart failure Father   . Anesthesia problems Neg Hx   . Hypotension Neg Hx   . Malignant hyperthermia Neg Hx   . Pseudochol deficiency Neg Hx     Social History:  reports that he has never smoked. He does not have any smokeless tobacco history on file. He reports that he does not drink alcohol or use illicit drugs.  Allergies  Allergen Reactions  . Penicillins Anaphylaxis    "eyes swell shut"    MEDICATIONS:                                                                                                                     No current facility-administered medications for this encounter.   Current Outpatient Prescriptions  Medication Sig Dispense Refill  . atorvastatin (LIPITOR) 20 MG tablet Take 20 mg by mouth daily.    . Cholecalciferol (VITAMIN D) 2000 UNITS tablet Take 2,000 Units by mouth daily.    . citalopram (CELEXA) 40 MG tablet Take 40 mg by  mouth daily.    Marland Kitchen FARXIGA 10 MG TABS tablet     . HYDROcodone-acetaminophen (NORCO/VICODIN) 5-325 MG per tablet Take 1-2 tablets by mouth every 6 (six) hours as needed. 20 tablet 0  . lisinopril (PRINIVIL,ZESTRIL) 10 MG tablet Take 10 mg by mouth daily.    . meloxicam (MOBIC) 15 MG tablet Take 15 mg by mouth daily.    . metFORMIN (GLUCOPHAGE) 1000 MG tablet Take 1,000 mg by mouth 2 (two) times daily with a meal.    . metoprolol tartrate (LOPRESSOR) 25 MG tablet Take 25 mg by mouth 2 (two) times daily.    Marland Kitchen NITROSTAT 0.4 MG SL tablet Place 0.4 mg under the tongue every 5 (five) minutes as needed for chest pain.     Marland Kitchen senna-docusate (SENOKOT-S) 8.6-50 MG per tablet Take 1 tablet by mouth daily as needed for mild constipation.        ROS:  History obtained from the patient  General ROS: negative for - chills, fatigue, fever, night sweats, weight gain or weight loss Psychological ROS: negative for - behavioral disorder, hallucinations, memory difficulties, mood swings or suicidal ideation Ophthalmic ROS: negative for - blurry vision, double vision, eye pain or loss of vision ENT ROS: negative for - epistaxis, nasal discharge, oral lesions, sore throat, tinnitus or vertigo Allergy and Immunology ROS: negative for - hives or itchy/watery eyes Hematological and Lymphatic ROS: negative for - bleeding problems, bruising or swollen lymph nodes Endocrine ROS: negative for - galactorrhea, hair pattern changes, polydipsia/polyuria or temperature intolerance Respiratory ROS: negative for - cough, hemoptysis, shortness of breath or wheezing Cardiovascular ROS: negative for - chest pain, dyspnea on exertion, edema or irregular heartbeat Gastrointestinal ROS: negative for - abdominal pain, diarrhea, hematemesis, nausea/vomiting or stool incontinence Genito-Urinary ROS:  negative for - dysuria, hematuria, incontinence or urinary frequency/urgency Musculoskeletal ROS: negative for - joint swelling or muscular weakness Neurological ROS: as noted in HPI Dermatological ROS: negative for rash and skin lesion changes   Blood pressure 124/85, pulse 67, temperature 97.9 F (36.6 C), temperature source Oral, resp. rate 16, height 6\' 1"  (1.854 m), weight 117.935 kg (260 lb), SpO2 92 %.   Neurologic Examination:                                                                                                      HEENT-  Normocephalic, no lesions, without obvious abnormality.  Normal external eye and conjunctiva.  Normal TM's bilaterally.  Normal auditory canals and external ears. Normal external nose, mucus membranes and septum.  Normal pharynx. Cardiovascular- S1, S2 normal, pulses palpable throughout   Lungs- chest clear, no wheezing, rales, normal symmetric air entry Abdomen- normal findings: bowel sounds normal Extremities- no edema Lymph-no adenopathy palpable Musculoskeletal-no joint tenderness, deformity or swelling Skin-warm and dry, no hyperpigmentation, vitiligo, or suspicious lesions  Neurological Examination Mental Status: Alert, oriented, thought content appropriate.  Speech fluent without evidence of aphasia.  Able to follow 3 step commands without difficulty. Cranial Nerves: II: Discs flat bilaterally; Visual fields grossly normal, pupils equal, round, reactive to light and accommodation III,IV, VI: ptosis not present, extra-ocular motions intact bilaterally V,VII: smile symmetric, facial light touch sensation normal bilaterally VIII: hearing normal bilaterally IX,X: uvula rises symmetrically XI: bilateral shoulder shrug XII: midline tongue extension Motor: Right : Upper extremity   5/5    Left:     Upper extremity   5/5  Lower extremity   5/5     Lower extremity   5/5 Tone and bulk:normal tone throughout; no atrophy noted Sensory: Pinprick and  light touch intact throughout, bilaterally Deep Tendon Reflexes: 2+ and symmetric throughout Plantars: Right: downgoing   Left: downgoing Cerebellar: normal finger-to-nose,  and normal heel-to-shin test Gait: not tested      Lab Results: Basic Metabolic Panel:  Recent Labs Lab 08/05/15 1538  NA 138  K 4.7  CL 109  CO2 20*  GLUCOSE 85  BUN 27*  CREATININE 1.65*  CALCIUM 9.3    Liver Function Tests: No  results for input(s): AST, ALT, ALKPHOS, BILITOT, PROT, ALBUMIN in the last 168 hours. No results for input(s): LIPASE, AMYLASE in the last 168 hours. No results for input(s): AMMONIA in the last 168 hours.  CBC:  Recent Labs Lab 08/05/15 1538  WBC 9.3  HGB 12.5*  HCT 38.1*  MCV 92.9  PLT 239    Cardiac Enzymes: No results for input(s): CKTOTAL, CKMB, CKMBINDEX, TROPONINI in the last 168 hours.  Lipid Panel: No results for input(s): CHOL, TRIG, HDL, CHOLHDL, VLDL, LDLCALC in the last 168 hours.  CBG: No results for input(s): GLUCAP in the last 168 hours.  Microbiology: Results for orders placed or performed during the hospital encounter of 06/25/13  Stone analysis     Status: None   Collection Time: 06/25/13 11:33 AM  Result Value Ref Range Status   [place] Not observed  Final   Component 1 KSTONE See Below  Final    Comment: (NOTE) Uric Acid Dihydrate 80% Calcium Oxalate Monohydrate ([place]) 20%   Stone Weight KSTONE 3.3870 g Final    Comment: Performed at [facility].    Coagulation Studies: No results for input(s): LABPROT, INR in the last 72 hours.  Imaging: No results found.     Assessment and plan per attending neurologist  Etta Quill PA-C [facility] [phone]  08/05/2015, 4:12 PM   Assessment/Plan:   69 YO male with known migraine HA.  Over the last 5 days experiencing HA associated with visual changes. HE had a carotid doppler study yesterday showing 40% stenosis in the right carotid. Likely  migraine equivalents in patient with history of migraine headaches.   Recommend: 1) MRI/MRA; no further acute intervention is warranted if findings are unremarkable. Admission and further workup if an acute ischemic lesion or significant vascular anomaly is demonstrated. 2) Topamax 25 mg BID migraine prevention 3) Continue Plavix 75 mg per day 4) follow up with Dr. Baxter Flattery as out patient.   I personally participated in this patient's evaluation and management, including formulating above clinical impression and management recommendations.  Rush Farmer M.D. [facility] [phone]

## 2015-08-05 NOTE — Discharge Instructions (Signed)
Recurrent Migraine Headache A migraine headache is very bad, throbbing pain on one or both sides of your head. Recurrent migraines keep coming back. Talk to your doctor about what things may bring on (trigger) your migraine headaches. HOME CARE  Only take medicines as told by your doctor.  Lie down in a dark, quiet room when you have a migraine.  Keep a journal to find out if certain things bring on migraine headaches. For example, write down:  What you eat and drink.  How much sleep you get.  Any change to your diet or medicines.  Lessen how much alcohol you drink.  Quit smoking if you smoke.  Get enough sleep.  Lessen any stress in your life.  Keep lights dim if bright lights bother you or make your migraines worse. GET HELP IF:  Medicine does not help your migraines.  Your pain keeps coming back.  You have a fever. GET HELP RIGHT AWAY IF:   Your migraine becomes really bad.  You have a stiff neck.  You have trouble seeing.  Your muscles are weak, or you lose muscle control.  You lose your balance or have trouble walking.  You feel like you will pass out (faint), or you pass out.  You have really bad symptoms that are different than your first symptoms. MAKE SURE YOU:   Understand these instructions.  Will watch your condition.  Will get help right away if you are not doing well or get worse.   This information is not intended to replace advice given to you by your health care provider. Make sure you discuss any questions you have with your health care provider.   Document Released: 06/07/2008 Document Revised: 09/03/2013 Document Reviewed: 05/06/2013 Elsevier Interactive Patient Education [employer].

## 2015-08-05 NOTE — ED Provider Notes (Signed)
CSN: [id number]     Arrival date & time 08/05/15  1527 History   First MD Initiated Contact with Patient 08/05/15 1541     Chief Complaint  Patient presents with  . Blurred Vision      HPI Pt states that Thursday night he had a sudden onset of blurry vision in his right eye that transitioned to losing all the vision in his right eye for 15 minutes. Pt reports having a similar episode on Saturday and also at 1430 today. Today at 1430 pt had a sudden onset of blurry vision and was seeing double in left eye that also lasted 15 minutes and went away. Pt denies any vision changes at this time. pts speech is clear, hand grips equal, no other neuro deficits noted.  Past Medical History  Diagnosis Date  . Hypertension   . Benign prostatic hypertrophy     takes Flomax daily  . ED (erectile dysfunction)   . Complication of anesthesia     hard to wake up  . Hyperlipidemia   . Arthritis     bil knees  . Anxiety     takes Celexa  . Spinal headache   . PONV (postoperative nausea and vomiting)   . Diabetes mellitus without complication ([place])   . BPH with urinary obstruction 06/25/2013    127cc prostate with large middle lobe and bladder stones.   . Bladder stones 06/25/2013    1.9cm bladder stone.  . Anginal pain ([place])   . Sleep apnea     uses bipap machine   Past Surgical History  Procedure Laterality Date  . Colonoscopy    . Knee arthroscopy  09/07/2010    left knee  . Cataract extraction w/ intraocular lens  implant, bilateral  03/13/2003  . Retinal detachment repair w/ scleral buckle le  10/14/2002    Dr Zadie Rhine  . Knee arthroscopy  05/27/2000    left knee  . Tonsillectomy      age 16  . Vasectomy  1982  . Total knee arthroplasty  08/01/2011    Procedure: TOTAL KNEE ARTHROPLASTY;  Surgeon: Lorn Junes, MD;  Location: [facility];  Service: Orthopedics;  Laterality: Right;  TOTAL KNEE ARTHROPLASTY  RIGHT SIDE  . Cystoscopy with litholapaxy N/A 06/25/2013    Procedure: CYSTOSCOPY  WITH LITHOLAPAXY;  Surgeon: [name], MD;  Location: [facility];  Service: Urology;  Laterality: N/A;  . Transurethral resection of prostate N/A 06/25/2013    Procedure: TRANSURETHRAL RESECTION OF THE PROSTATE WITH GYRUS INSTRUMENTS;  Surgeon: [name], MD;  Location: [facility];  Service: Urology;  Laterality: N/A;  . Cardiac catheterization  06-15-14  . Left heart catheterization with coronary angiogram N/A 06/17/2014    Procedure: LEFT HEART CATHETERIZATION WITH CORONARY ANGIOGRAM;  Surgeon: Laverda Page, MD;  Location: [facility] CATH LAB;  Service: Cardiovascular;  Laterality: N/A;   Family History  Problem Relation Age of Onset  . Arthritis Other   . Cancer Other     breast, skin  . Coronary artery disease Other   . Heart disease Other   . Hypertension Mother   . Heart failure Father   . Anesthesia problems Neg Hx   . Hypotension Neg Hx   . Malignant hyperthermia Neg Hx   . Pseudochol deficiency Neg Hx    Social History  Substance Use Topics  . Smoking status: Never Smoker   . Smokeless tobacco: None  . Alcohol Use: No    Review of Systems  All  other systems reviewed and are negative.     Allergies  Penicillins  Home Medications   Prior to Admission medications   Medication Sig Start Date End Date Taking? Authorizing Provider  aspirin EC 81 MG tablet Take 81 mg by mouth daily.   Yes Historical Provider, MD  atorvastatin (LIPITOR) 20 MG tablet Take 20 mg by mouth daily.   Yes Historical Provider, MD  Cholecalciferol (VITAMIN D) 2000 UNITS tablet Take 2,000 Units by mouth daily.   Yes Historical Provider, MD  citalopram (CELEXA) 40 MG tablet Take 40 mg by mouth daily.   Yes Historical Provider, MD  clopidogrel (PLAVIX) 75 MG tablet Take 75 mg by mouth daily. 08/03/15  Yes Historical Provider, MD  FARXIGA 10 MG TABS tablet Take 10 mg by mouth daily.  06/15/15  Yes Historical Provider, MD  HYDROcodone-acetaminophen (NORCO/VICODIN) 5-325 MG per tablet Take 1-2 tablets by mouth  every 6 (six) hours as needed. Patient taking differently: Take 1-2 tablets by mouth every 6 (six) hours as needed for moderate pain or severe pain.  01/26/15  Yes Rana Snare, MD  lisinopril (PRINIVIL,ZESTRIL) 10 MG tablet Take 10 mg by mouth daily.   Yes Historical Provider, MD  meloxicam (MOBIC) 15 MG tablet Take 15 mg by mouth daily.   Yes Historical Provider, MD  metFORMIN (GLUCOPHAGE) 1000 MG tablet Take 1,000 mg by mouth 2 (two) times daily with a meal.   Yes Historical Provider, MD  metoprolol tartrate (LOPRESSOR) 25 MG tablet Take 25 mg by mouth 2 (two) times daily.   Yes Historical Provider, MD  NITROSTAT 0.4 MG SL tablet Place 0.4 mg under the tongue every 5 (five) minutes as needed for chest pain.  05/23/14  Yes Historical Provider, MD  senna-docusate (SENOKOT-S) 8.6-50 MG per tablet Take 1 tablet by mouth daily as needed for mild constipation.   Yes Historical Provider, MD  topiramate (TOPAMAX) 25 MG tablet Take 1 tablet (25 mg total) by mouth 2 (two) times daily. 08/05/15   Leonard Schwartz, MD   BP 131/86 mmHg  Pulse 58  Temp(Src) 98 F (36.7 C) (Oral)  Resp 18  Ht 6\' 1"  (1.854 m)  Wt 260 lb (117.935 kg)  BMI 34.31 kg/m2  SpO2 100% Physical Exam  Constitutional: He is oriented to person, place, and time. He appears well-developed and well-nourished. No distress.  HENT:  Head: Normocephalic and atraumatic.  Eyes: Conjunctivae and EOM are normal. Pupils are equal, round, and reactive to light. No scleral icterus.  Neck: Normal range of motion.  Cardiovascular: Normal rate and intact distal pulses.   Pulmonary/Chest: No respiratory distress.  Abdominal: Normal appearance. He exhibits no distension.  Musculoskeletal: Normal range of motion.  Neurological: He is alert and oriented to person, place, and time. No cranial nerve deficit.  Skin: Skin is warm and dry. No rash noted.  Psychiatric: He has a normal mood and affect. His behavior is normal.  Nursing note and vitals  reviewed.   ED Course  Procedures (including critical care time) Labs Review Labs Reviewed  BASIC METABOLIC PANEL - Abnormal; Notable for the following:    CO2 20 (*)    BUN 27 (*)    Creatinine, Ser 1.65 (*)    GFR calc non Af Amer 41 (*)    GFR calc Af Amer 48 (*)    All other components within normal limits  CBC - Abnormal; Notable for the following:    RBC 4.10 (*)    Hemoglobin 12.5 (*)  HCT 38.1 (*)    All other components within normal limits  URINALYSIS, ROUTINE W REFLEX MICROSCOPIC (NOT AT [facility] - Abnormal; Notable for the following:    Glucose, UA >1000 (*)    Hgb urine dipstick MODERATE (*)    All other components within normal limits  URINE MICROSCOPIC-ADD ON - Abnormal; Notable for the following:    Squamous Epithelial / LPF 0-5 (*)    Bacteria, UA FEW (*)    All other components within normal limits  CBG MONITORING, ED - Abnormal; Notable for the following:    Glucose-Capillary 101 (*)    All other components within normal limits    Imaging Review Mr Angiogram Head Wo Contrast  08/05/2015  CLINICAL DATA:  Headaches with recent visual disturbances. Currently asymptomatic. EXAM: MRI HEAD WITHOUT CONTRAST MRA HEAD WITHOUT CONTRAST TECHNIQUE: Multiplanar, multiecho pulse sequences of the brain and surrounding structures were obtained without intravenous contrast. Angiographic images of the head were obtained using MRA technique without contrast. COMPARISON:  CT orbits earlier today FINDINGS: MRI HEAD FINDINGS There is no evidence of acute infarct, intracranial hemorrhage, mass, midline shift, or extra-axial fluid collection. There is moderate generalized cerebral atrophy. No significant cerebral white matter disease is seen for age. Prior bilateral cataract extraction and a right scleral buckle are noted. Small left maxillary sinus mucous retention cysts are present. Mastoid air cells are clear. Major intracranial vascular flow voids are preserved. MRA HEAD FINDINGS The  visualized distal vertebral arteries are patent and codominant without stenosis. PICAs are not identified. AICA and SCA origins are patent. There is likely a small fenestration in the proximal basilar artery. Posterior communicating arteries are not identified. PCAs are patent without evidence of significant stenosis. Internal carotid arteries are patent from skullbase to carotid termini without stenosis. Ophthalmic artery origins are patent. ACAs and MCAs are patent without evidence of significant stenosis or sizable branch vessel occlusion. There is an early bifurcation of the left MCA. No intracranial aneurysm is identified. IMPRESSION: 1. No acute intracranial abnormality. 2. Moderate cerebral atrophy. 3. No medium or large vessel intracranial arterial occlusion or significant stenosis. Electronically Signed   By: Logan Bores M.D.   On: 08/05/2015 20:51   Mr Brain Wo Contrast  08/05/2015  CLINICAL DATA:  Headaches with recent visual disturbances. Currently asymptomatic. EXAM: MRI HEAD WITHOUT CONTRAST MRA HEAD WITHOUT CONTRAST TECHNIQUE: Multiplanar, multiecho pulse sequences of the brain and surrounding structures were obtained without intravenous contrast. Angiographic images of the head were obtained using MRA technique without contrast. COMPARISON:  CT orbits earlier today FINDINGS: MRI HEAD FINDINGS There is no evidence of acute infarct, intracranial hemorrhage, mass, midline shift, or extra-axial fluid collection. There is moderate generalized cerebral atrophy. No significant cerebral white matter disease is seen for age. Prior bilateral cataract extraction and a right scleral buckle are noted. Small left maxillary sinus mucous retention cysts are present. Mastoid air cells are clear. Major intracranial vascular flow voids are preserved. MRA HEAD FINDINGS The visualized distal vertebral arteries are patent and codominant without stenosis. PICAs are not identified. AICA and SCA origins are patent.  There is likely a small fenestration in the proximal basilar artery. Posterior communicating arteries are not identified. PCAs are patent without evidence of significant stenosis. Internal carotid arteries are patent from skullbase to carotid termini without stenosis. Ophthalmic artery origins are patent. ACAs and MCAs are patent without evidence of significant stenosis or sizable branch vessel occlusion. There is an early bifurcation of the left MCA. No intracranial  aneurysm is identified. IMPRESSION: 1. No acute intracranial abnormality. 2. Moderate cerebral atrophy. 3. No medium or large vessel intracranial arterial occlusion or significant stenosis. Electronically Signed   By: Logan Bores M.D.   On: 08/05/2015 20:51   Ct Orbitss W/o Cm  08/05/2015  CLINICAL DATA:  Ocular pain and pressure with blurry vision EXAM: CT ORBITS WITHOUT CONTRAST TECHNIQUE: Multidetector CT imaging of the orbits was performed following the standard protocol without intravenous contrast. COMPARISON:  None. FINDINGS: Bony structures are within normal limits. The paranasal sinuses demonstrate evidence of mucosal retention cysts within the left maxillary antrum. Postoperative changes are noted within the right eye consistent with the history of detached retina and repair. The orbits are otherwise within normal limits. Atrophic changes are noted intracranially. No definitive sellar mass is seen. IMPRESSION: Mild mucosal disease within the left maxillary antrum. Post operative changes in the right globe. No other focal abnormality is seen to correspond with the patient's given clinical symptomatology. Electronically Signed   By: Inez Catalina M.D.   On: 08/05/2015 18:59   I have personally reviewed and evaluated these images and lab results as part of my medical decision-making.   EKG Interpretation   Date/Time:  Wednesday August 05 2015 15:32:31 EST Ventricular Rate:  68 PR Interval:  202 QRS Duration: 122 QT Interval:   410 QTC Calculation: 435 R Axis:   -3 Text Interpretation:  Normal sinus rhythm Non-specific intra-ventricular  conduction delay ST \\T \ T wave abnormality, consider inferolateral  ischemia Abnormal ECG Confirmed by Kesleigh Morson  MD, Raynee Mccasland ([id number]) on  08/05/2015 5:43:52 PM     Neurology was consult and saw the patient in emergency department. MDM   Final diagnoses:  Vision problems  Other migraine without status migrainosus, not intractable        Leonard Schwartz, MD 08/05/15 2124

## 2015-08-05 NOTE — ED Notes (Signed)
Patient transported to MRI 

## 2015-08-05 NOTE — ED Notes (Signed)
MRI called to check on CT results.  Results still pending.

## 2015-08-05 NOTE — ED Notes (Signed)
Pt states that Thursday night he had a sudden onset of blurry vision in his right eye that transitioned to losing all the vision in his right eye for 15 minutes. Pt reports having a similar episode on Saturday and also at 1430 today. Today at 1430 pt had a sudden onset of blurry vision and was seeing double in left eye that also lasted 15 minutes and went away. Pt denies any vision changes at this time. pts speech is clear, hand grips equal, no other neuro deficits noted.

## 2015-08-10 DIAGNOSIS — H33302 Unspecified retinal break, left eye: Secondary | ICD-10-CM | POA: Diagnosis not present

## 2015-08-19 ENCOUNTER — Encounter: Payer: Self-pay | Admitting: Neurology

## 2015-08-19 ENCOUNTER — Ambulatory Visit (INDEPENDENT_AMBULATORY_CARE_PROVIDER_SITE_OTHER): Payer: Medicare Other | Admitting: Neurology

## 2015-08-19 VITALS — BP 110/58 | HR 72 | Resp 18 | Ht 72.0 in | Wt 257.0 lb

## 2015-08-19 DIAGNOSIS — G4733 Obstructive sleep apnea (adult) (pediatric): Secondary | ICD-10-CM | POA: Diagnosis not present

## 2015-08-19 DIAGNOSIS — R42 Dizziness and giddiness: Secondary | ICD-10-CM

## 2015-08-19 DIAGNOSIS — R51 Headache: Secondary | ICD-10-CM | POA: Diagnosis not present

## 2015-08-19 DIAGNOSIS — E669 Obesity, unspecified: Secondary | ICD-10-CM

## 2015-08-19 DIAGNOSIS — R519 Headache, unspecified: Secondary | ICD-10-CM

## 2015-08-19 DIAGNOSIS — G4731 Primary central sleep apnea: Secondary | ICD-10-CM | POA: Diagnosis not present

## 2015-08-19 MED ORDER — TOPIRAMATE 25 MG PO TABS: ORAL_TABLET | ORAL | Status: DC

## 2015-08-19 NOTE — Progress Notes (Signed)
Subjective:    Patient ID: Keith [name]. is a 69 y.o. male.  HPI     Interim history:  Keith [name] is a very pleasant 69 year old right-handed gentleman with an underlying medical history of obesity, BPH, bladder stones, type 2 diabetes, degenerative joint disease and chronic back pain, hyperlipidemia, chronic rhinitis, depression, and migraine headaches, who presents for a new problem of migraine HA, referred by the ER. The patient is accompanied by his wife today. I last saw him on 06/24/2015 for his complex sleep apnea for which he is on treatment with BiPAP therapy. He was not fully compliant with treatment at the time. He reported that his wife had to have spine surgery at [facility] and he was staying with her quite a bit and he could not use his BiPAP. He did note that he slept better when he was using his machine. He had started a new diabetes medication and had lost about 10 pounds. His sugar values improved as well. He presented to the emergency room on 08/05/2015 with intermittent throbbing headaches with visual disturbance. He had a throbbing headache behind his left eye. He had bright spots and what sounded like visual auras. He had decreased vision of the right eye for about a minute. He saw his eye doctor. He had a carotid Doppler study on 08/04/15, which showed less than 40% stenosis of the b/l ICAs. I reviewed the test results.  I also reviewed the emergency room records as well as the neurological consultation note by Dr. Nicole Kindred. During his emergency room stay he had a brain MRI without contrast as well as a brain MRA without contrast: IMPRESSION: 1. No acute intracranial abnormality. 2. Moderate cerebral atrophy. 3. No medium or large vessel intracranial arterial occlusion or significant stenosis.  He was started on Topamax for headache prevention, 25 mg twice daily. He was advised to continue with Plavix once daily.  Blood test results through the emergency room from 08/05/2015  showed: BUN elevated at 27, creatinine elevated at 1.65, CO2 at 20, capillary blood sugar was 101, urinalysis negative for infection but positive for hemoglobin. CBC showed hemoglobin mildly low at 12.5 and hematocrit mildly low normal at 38.1.   Today, 08/19/2015: I reviewed his BiPAP compliance data from 07/19/2015 through 08/17/2015 which is a total of 30 days during which time he used his machine 29 days with percent used days greater than 4 hours at 43%, indicating suboptimal compliance with an average usage of only 3 hours and 38 minutes, residual AHI low at 1.9 per hour, leaked low with the 95th percentile at 5.3 L/m on a pressure of 22/18 with a rate of 12/m.   Today, 08/19/2015: He reports that he is taking Topamax 25 mg twice daily, he seems to tolerated well. He has not noticed much in the way of improvement yet. He reports 5 or 6 brief headaches per day. They're usually bifrontal or behind his eyes. He had his ophthalmology follow-up recently within the last 3 weeks. He has not been sleeping well. He has to get up to use the bathroom usually 2 or 3 times per night. He tends to take his BiPAP mask off in the middle of the night. His wife adds that he has been more depressed lately. She endorses that they had a lot of stress because of her surgery and prolonged rehabilitation. He has had some intermittent dizziness. His blood pressure seems to be lower than normal. He does not have a blood pressure monitor  at home.   Previously:   I saw him on 12/23/2014, at which time he reported that he was not using his BiPAP regularly secondary to a recent cough. He had increased his compliance however. He had some dryness of mouth. He still had nocturia about 3 times per night. He felt that this was actually better. Overall, he was doing fairly well. In the interim, he had left-sided lithotripsy for kidney stones on the left kidney on 01/26/2015.   I reviewed his BiPAP compliance data from 05/24/2015  through 06/22/2015 which is a total of 30 days during which time he used his machine only 16 days with percent used days greater than 4 hours at only 13%, indicating poor compliance with an average usage of 3 hours and 17 minutes but overall daily usage of only 1 hour and 45 minutes, residual AHI at 1.7 per hour, leak acceptable for the 95th percentile at 12.8 L/m on a pressure of 22/18 with a rate of 12.  I saw him on 09/25/2014, at which time he reported still some trouble tolerating his mask. He started using his old mask from years ago and I tried to order it. Overall, he felt reasonably well treated with BiPAP and he was compliant. I suggested a six-month follow-up.  I reviewed his BiPAP ST compliance data from 11/22/2014 through 12/21/2014 which is a total of 30 days during which time he used his mask only 18 days with percent used days greater than 4 hours at only 23%, indicating poor compliance and much worse compliance than before. Average usage for all nights of only 2 hours and 2 minutes, pressure at 22/18 cm with a rate of 12. Residual AHI borderline at 5.9 and leak for the most part acceptable with the 95th percentile at 23.3 L/m.    I saw him on 06/23/2014, at which time he reported using his BiPAP machine regularly. He had left heart catheterization and also went on a 2 day trip to a friend's house where there was no access to electricity in the room he was not using his BiPAP during his naps. He reported severe mouth dryness at times. His nocturia had improved about 50% he felt. He had some air leak issues from the mask which improved when he changed the size of the mask. He was using a fullface mask. I discussed a sleep study results at length last time. His physical exam was stable. I asked him to continue to use his BiPAP regularly, also during his naps.  I reviewed his compliance data from 08/23/2014 through 09/21/2014 which is a total of 30 days during which time he used his BiPAP every  night except for 3 nights. Percent used days greater than 4 hours was 80%, indicating very good compliance, average usage of 5 hours and 26 minutes, setting of BiPAP ST at 22/18 with a rate of 12, residual AHI acceptable at 3.6 per hour and leak acceptable with the 95th percentile at 15.1 L/m.  I first met him on 03/13/2014, at which time he reported a prior diagnosis with obstructive sleep apnea over 25 years ago. He had been using CPAP off and on for years with some improvement in snoring and in his sleep reported. He reported purchasing his own CPAP machine and had brought it to his appointment last time, which is a ResMed S6, without a humidifier and he had a large [employer] FFM, as he is a mouth breather. As he had not had a reevaluation  of his sleep apnea, I suggested he return for a sleep study. He had a split-night sleep study on 04/29/2014 as well as a full night titration study subsequently on 05/19/2014 and I went over his sleep study results with him in detail today. His split-night sleep study from 04/29/2014 showed a baseline sleep efficiency at 73.9% with a latency sleep of 11.5 minutes and wake after sleep onset of 32 minutes with moderate to severe sleep fragmentation noted. He had an elevated arousal index. He had an elevated percentage of stage I and stage II sleep, and absence of dream sleep. He had multifocal PVCs. He had a brief 6 beat run of what appeared to be ventricular tachycardia followed by multifocal PVCs. Severe PLMS were noted with very little arousals. He had a total of 1 obstructive to mixed in 57 central apneas as well as 42 obstructive and 14 central hypopneas with an elevated AHI of 56.6 per hour. Baseline oxygen saturation was 91%, nadir was 78%. He was therefore started on CPAP. This was started at 5 cm and increased to 6 cm then decreased again to 4 cm because he had ongoing central sleep disordered breathing. His AHI remained high. He had an increased and  deepened dream sleep. His baseline oxygen saturation was slightly improved at 92%. Nadir was 81%. He was switched to BiPAP therapy first at 10/6 cm and titrated gradually. He still had significant central sleep apneas. He was switched to BiPAP ST with a final pressure of 16/12 at a rate of 10. However, on the final settings he still had an AHI of 11.7 per hour. I suggested he start BiPAP ST at home but was also advised to return for a full night BiPAP ST titration for proper optimalization of his treatment settings. He had a BiPAP study on 05/19/2014. Sleep efficiency was 87.6% with a latency to sleep of 14.5 minutes and wake after sleep onset of 46.5 minutes with mild to moderate sleep fragmentation noted. Arousal index was mildly elevated. He had an increased percentage of light stage sleep, nearly 5% of deep sleep, and a decreased percentage of dream sleep at only 3.2%. Latency to REM sleep was very prolonged at 350.5 minutes. Moderate periodic leg movements were noted at 36.8 per hour, resulting in an arousal index of 4.2 per hour. Rare PVCs were noted and overall his EKG looked much improved from his original sleep study from 04/29/2014. He had a total of one central apnea, 19 obstructive hypopneas and one mixed hypopneas for the study. Baseline oxygen saturation was much improved at 95% with a nadir of 80%. On the final setting his oxygen saturation remained above 90%. Snoring was eliminated. BiPAP ST was started at 10/6 cm with a rate of 10 using a large fullface mask. A chin strap was needed due to residual mouth opening. BiPAP was titrated to a pressure of 22/18 with a rate of 12 per minute. On this final setting his AHI was 0.8 per hour.   I reviewed his compliance data from 05/21/2014 through 06/19/2014 which is a total of 30 days during which time he used his machine every night, percent used days greater than 4 hours was 90%, indicating excellent compliance, settings of 22/18 with a rate of 12 with a  residual AHI of 6.9 per hour but leak was at times high with the 95th percentile 42.3 L per minute. Average usage of 6 hours and 11 minutes. More recently in the last 10-14 days his sleep had improved.  In the interim he had a LHC on 06/17/14.     His Past Medical History Is Significant For: Past Medical History  Diagnosis Date  . Hypertension   . Benign prostatic hypertrophy     takes Flomax daily  . ED (erectile dysfunction)   . Complication of anesthesia     hard to wake up  . Hyperlipidemia   . Arthritis     bil knees  . Anxiety     takes Celexa  . Spinal headache   . PONV (postoperative nausea and vomiting)   . Diabetes mellitus without complication ([place])   . BPH with urinary obstruction 06/25/2013    127cc prostate with large middle lobe and bladder stones.   . Bladder stones 06/25/2013    1.9cm bladder stone.  . Anginal pain ([place])   . Sleep apnea     uses bipap machine    His Past Surgical History Is Significant For: Past Surgical History  Procedure Laterality Date  . Colonoscopy    . Knee arthroscopy  09/07/2010    left knee  . Cataract extraction w/ intraocular lens  implant, bilateral  03/13/2003  . Retinal detachment repair w/ scleral buckle le  10/14/2002    Dr Zadie Rhine  . Knee arthroscopy  05/27/2000    left knee  . Tonsillectomy      age 50  . Vasectomy  1982  . Total knee arthroplasty  08/01/2011    Procedure: TOTAL KNEE ARTHROPLASTY;  Surgeon: Lorn Junes, MD;  Location: [facility];  Service: Orthopedics;  Laterality: Right;  TOTAL KNEE ARTHROPLASTY  RIGHT SIDE  . Cystoscopy with litholapaxy N/A 06/25/2013    Procedure: CYSTOSCOPY WITH LITHOLAPAXY;  Surgeon: [name], MD;  Location: [facility];  Service: Urology;  Laterality: N/A;  . Transurethral resection of prostate N/A 06/25/2013    Procedure: TRANSURETHRAL RESECTION OF THE PROSTATE WITH GYRUS INSTRUMENTS;  Surgeon: [name], MD;  Location: [facility];  Service: Urology;  Laterality: N/A;  . Cardiac  catheterization  06-15-14  . Left heart catheterization with coronary angiogram N/A 06/17/2014    Procedure: LEFT HEART CATHETERIZATION WITH CORONARY ANGIOGRAM;  Surgeon: Laverda Page, MD;  Location: [facility] CATH LAB;  Service: Cardiovascular;  Laterality: N/A;    His Family History Is Significant For: Family History  Problem Relation Age of Onset  . Arthritis Other   . Cancer Other     breast, skin  . Coronary artery disease Other   . Heart disease Other   . Hypertension Mother   . Heart failure Father   . Anesthesia problems Neg Hx   . Hypotension Neg Hx   . Malignant hyperthermia Neg Hx   . Pseudochol deficiency Neg Hx     His Social History Is Significant For: Social History   Social History  . Marital Status: Married    Spouse Name: N/A  . Number of Children: N/A  . Years of Education: N/A   Social History Main Topics  . Smoking status: Never Smoker   . Smokeless tobacco: None  . Alcohol Use: No  . Drug Use: No  . Sexual Activity: Not Asked   Other Topics Concern  . None   Social History Narrative   Right handed.     His Allergies Are:  See chart:   His Current Medications Are:  Outpatient Encounter Prescriptions as of 08/19/2015  Medication Sig  . aspirin EC 81 MG tablet Take 81 mg by mouth daily.  Marland Kitchen atorvastatin (LIPITOR) 20  MG tablet Take 20 mg by mouth daily.  . Cholecalciferol (VITAMIN D) 2000 UNITS tablet Take 2,000 Units by mouth daily.  . citalopram (CELEXA) 40 MG tablet Take 40 mg by mouth daily.  . clopidogrel (PLAVIX) 75 MG tablet Take 75 mg by mouth daily.  Marland Kitchen FARXIGA 10 MG TABS tablet Take 10 mg by mouth daily.   Marland Kitchen HYDROcodone-acetaminophen (NORCO/VICODIN) 5-325 MG per tablet Take 1-2 tablets by mouth every 6 (six) hours as needed. (Patient taking differently: Take 1-2 tablets by mouth every 6 (six) hours as needed for moderate pain or severe pain. )  . lisinopril (PRINIVIL,ZESTRIL) 10 MG tablet Take 10 mg by mouth daily.  . meloxicam (MOBIC) 15  MG tablet Take 15 mg by mouth daily.  . metFORMIN (GLUCOPHAGE) 1000 MG tablet Take 1,000 mg by mouth 2 (two) times daily with a meal.  . metoprolol tartrate (LOPRESSOR) 25 MG tablet Take 25 mg by mouth 2 (two) times daily.  Marland Kitchen NITROSTAT 0.4 MG SL tablet Place 0.4 mg under the tongue every 5 (five) minutes as needed for chest pain.   Marland Kitchen senna-docusate (SENOKOT-S) 8.6-50 MG per tablet Take 1 tablet by mouth daily as needed for mild constipation.  . topiramate (TOPAMAX) 25 MG tablet Take 1 in AM and 2 pill at night for 1 week, then 2 pills twice daily thereafter.  . [DISCONTINUED] topiramate (TOPAMAX) 25 MG tablet Take 1 tablet (25 mg total) by mouth 2 (two) times daily.   No facility-administered encounter medications on file as of 08/19/2015.  :  Review of Systems:  Out of a complete 14 point review of systems, all are reviewed and negative with the exception of these symptoms as listed below:   Review of Systems  Neurological: Positive for headaches.       Patient was seen in ED recently for headaches. He reports that he gets about 5-6 headaches a day.     Objective:  Neurologic Exam  Physical Exam Physical Examination:   Filed Vitals:   08/19/15 1437  BP: 110/58  Pulse: 72  Resp: 18   General Examination: The patient is a very pleasant 69 y.o. male in no acute distress. He appears well-developed and well-nourished and well groomed. He seems quiet and somewhat depressed today.  HEENT: Normocephalic, atraumatic, pupils are equal, round and reactive to light and accommodation. Extraocular tracking is good without limitation to gaze excursion or nystagmus noted. Normal smooth pursuit is noted. Hearing is grossly intact. Face is symmetric with normal facial animation and normal facial sensation. Speech is clear with no dysarthria noted. There is no hypophonia. There is no lip, neck/head, jaw or voice tremor. Neck is supple with full range of passive and active motion. There are no carotid  bruits on auscultation. Oropharynx exam reveals: moderate mouth dryness, adequate dental hygiene and moderate airway crowding, due to redundant soft palate. Mallampati is class II. Tongue protrudes centrally and palate elevates symmetrically. Tonsils are absent. He has a Mild to moderate overbite.   Chest: Clear to auscultation without wheezing, rhonchi or crackles noted.  Heart: S1+S2+0, regular and normal without murmurs, rubs or gallops noted.   Abdomen: Soft, non-tender and non-distended with normal bowel sounds appreciated on auscultation.  Extremities: There is no pitting edema in the distal lower extremities. Pedal pulses are intact.  Skin: Warm and dry without trophic changes noted. There are no varicose veins except mild on the L.  Musculoskeletal: exam reveals no obvious joint deformities, tenderness or joint swelling or erythema.  He had R knee replacement.   Neurologically:  Mental status: The patient is awake, alert and oriented in all 4 spheres. His immediate and remote memory, attention, language skills and fund of knowledge are appropriate. There is no evidence of aphasia, agnosia, apraxia or anomia. Speech is clear with normal prosody and enunciation. Thought process is linear. Mood is normal and affect is blunted.  Cranial nerves II - XII are as described above under HEENT exam. In addition: shoulder shrug is normal with equal shoulder height noted. Motor exam: Normal bulk, strength and tone is noted. There is no drift, tremor or rebound. Romberg is negative. Reflexes are 2+ throughout with the exception of trace reflex of his in the right knee, which is s/p TKA. Fine motor skills are intact.  Cerebellar testing: No dysmetria or intention tremor on finger to nose testing. Sensory exam: intact to light touch.  Gait, station and balance: He stands easily. No veering to one side is noted. No leaning to one side is noted. Posture is age-appropriate and stance is narrow based. Gait  shows normal stride length and normal pace. No problems turning are noted. He turns en bloc. Tandem walk is mildly difficult for him, unchanged from last time.             Assessment and Plan:  In summary, Keith [name]. is a very pleasant 69 year old male with an underlying medical history of obesity, BPH, bladder stones, type 2 diabetes, degenerative joint disease and chronic back pain, hyperlipidemia, chronic rhinitis, depression, headaches, who presents for a new problem of frequent and recurrent headaches. He went to the emergency room recently and had workup including MRI brain, MRA head without contrast, and he recently also had carotid Doppler studies which were benign. I reviewed his test results. He has a nonfocal exam today. He has been more depressed and his blood pressure is indeed lower than his typical today. He is advised to continue with Topamax but we should increase it gradually to 50 mg twice daily. I provided him with a new prescription and instructions in that regard. He is advised to continue to be more compliant with his BiPAP because untreated or undertreated sleep apnea can also cause recurrent headaches. He is advised to drink plenty of water and stay active. He is advised to make enough time for rest at night. He is advised to try to monitor his blood pressure and if it is consistently lower in the 100s or 110s over 50s and 60s, he is advised to talk to his primary care physician about blood pressure medication adjustment. Furthermore, for stress and mood related symptoms he is also encouraged to talk to Dr. Philip Aspen. He he has a routine follow-up appointment with his primary care physician scheduled for February. He may want to consider moving this up some. I've reassured him that his test results and neurological exam are nonfocal.  I suggested he keep his follow-up appointment with me in about 4 months, sooner if needed. I answered all their questions today and the patient and  his wife were in agreement.  I spent 25 minutes in total face-to-face time with the patient, more than 50% of which was spent in counseling and coordination of care, reviewing test results, reviewing medication and discussing or reviewing the diagnosis of recurrent HAs, CSA and OSA, the prognosis and treatment options.

## 2015-08-19 NOTE — Patient Instructions (Addendum)
Please continue using your BiPAP regularly. While your insurance requires that you use PAP at least 4 hours each night on 70% of the nights, I recommend, that you not skip any nights and use it throughout the night if you can. Untreated obstructive sleep apnea when it is moderate to severe can have an adverse impact on cardiovascular health and raise her risk for heart disease, arrhythmias, hypertension, congestive heart failure, stroke and diabetes. Untreated obstructive sleep apnea causes sleep disruption, nonrestorative sleep, and sleep deprivation. This can have an impact on your day to day functioning and cause daytime sleepiness and impairment of cognitive function, memory loss, mood disturbance, and problems focussing. Using BiPAP regularly can improve these symptoms.  Please be more compliant with your sleep apnea treatment, as your headaches may improve with full compliance.  Please remember, common headache triggers are: sleep deprivation, dehydration, overheating, stress, hypoglycemia or skipping meals and blood sugar fluctuations, excessive pain medications or excessive alcohol use or caffeine withdrawal. Some people have food triggers such as aged cheese, orange juice or chocolate, especially dark chocolate, or MSG (monosodium glutamate). Try to avoid these headache triggers as much possible. It may be helpful to keep a headache diary to figure out what makes your headaches worse or brings them on and what alleviates them. Some people report headache onset after exercise but studies have shown that regular exercise may actually prevent headaches from coming. If you have exercise-induced headaches, please make sure that you drink plenty of fluid before and after exercising and that you do not over do it and do not overheat.  Please monitor your blood pressure over the next several days, if you run consistently lower, in the 110s/50s and 60s, you will have to talk to Dr. Philip Aspen about BP  management.

## 2015-08-26 DIAGNOSIS — I10 Essential (primary) hypertension: Secondary | ICD-10-CM | POA: Diagnosis not present

## 2015-08-26 DIAGNOSIS — Z6835 Body mass index (BMI) 35.0-35.9, adult: Secondary | ICD-10-CM | POA: Diagnosis not present

## 2015-08-26 DIAGNOSIS — F4321 Adjustment disorder with depressed mood: Secondary | ICD-10-CM | POA: Diagnosis not present

## 2015-08-26 DIAGNOSIS — R413 Other amnesia: Secondary | ICD-10-CM | POA: Diagnosis not present

## 2015-08-26 DIAGNOSIS — E1121 Type 2 diabetes mellitus with diabetic nephropathy: Secondary | ICD-10-CM | POA: Diagnosis not present

## 2015-08-26 DIAGNOSIS — E1129 Type 2 diabetes mellitus with other diabetic kidney complication: Secondary | ICD-10-CM | POA: Diagnosis not present

## 2015-08-26 DIAGNOSIS — I2510 Atherosclerotic heart disease of native coronary artery without angina pectoris: Secondary | ICD-10-CM | POA: Diagnosis not present

## 2015-08-26 DIAGNOSIS — G4733 Obstructive sleep apnea (adult) (pediatric): Secondary | ICD-10-CM | POA: Diagnosis not present

## 2015-08-26 DIAGNOSIS — E784 Other hyperlipidemia: Secondary | ICD-10-CM | POA: Diagnosis not present

## 2015-10-28 DIAGNOSIS — I10 Essential (primary) hypertension: Secondary | ICD-10-CM | POA: Diagnosis not present

## 2015-10-28 DIAGNOSIS — Z125 Encounter for screening for malignant neoplasm of prostate: Secondary | ICD-10-CM | POA: Diagnosis not present

## 2015-10-28 DIAGNOSIS — E784 Other hyperlipidemia: Secondary | ICD-10-CM | POA: Diagnosis not present

## 2015-10-28 DIAGNOSIS — E1129 Type 2 diabetes mellitus with other diabetic kidney complication: Secondary | ICD-10-CM | POA: Diagnosis not present

## 2015-11-03 DIAGNOSIS — R413 Other amnesia: Secondary | ICD-10-CM | POA: Diagnosis not present

## 2015-11-03 DIAGNOSIS — E1121 Type 2 diabetes mellitus with diabetic nephropathy: Secondary | ICD-10-CM | POA: Diagnosis not present

## 2015-11-03 DIAGNOSIS — Z0000 Encounter for general adult medical examination without abnormal findings: Secondary | ICD-10-CM | POA: Diagnosis not present

## 2015-11-03 DIAGNOSIS — R3129 Other microscopic hematuria: Secondary | ICD-10-CM | POA: Diagnosis not present

## 2015-11-03 DIAGNOSIS — R8299 Other abnormal findings in urine: Secondary | ICD-10-CM | POA: Diagnosis not present

## 2015-11-03 DIAGNOSIS — M545 Low back pain: Secondary | ICD-10-CM | POA: Diagnosis not present

## 2015-11-03 DIAGNOSIS — E784 Other hyperlipidemia: Secondary | ICD-10-CM | POA: Diagnosis not present

## 2015-11-03 DIAGNOSIS — Z6835 Body mass index (BMI) 35.0-35.9, adult: Secondary | ICD-10-CM | POA: Diagnosis not present

## 2015-11-03 DIAGNOSIS — E1129 Type 2 diabetes mellitus with other diabetic kidney complication: Secondary | ICD-10-CM | POA: Diagnosis not present

## 2015-11-03 DIAGNOSIS — I2510 Atherosclerotic heart disease of native coronary artery without angina pectoris: Secondary | ICD-10-CM | POA: Diagnosis not present

## 2015-11-03 DIAGNOSIS — Z1389 Encounter for screening for other disorder: Secondary | ICD-10-CM | POA: Diagnosis not present

## 2015-11-03 DIAGNOSIS — G4733 Obstructive sleep apnea (adult) (pediatric): Secondary | ICD-10-CM | POA: Diagnosis not present

## 2015-11-03 DIAGNOSIS — F4321 Adjustment disorder with depressed mood: Secondary | ICD-10-CM | POA: Diagnosis not present

## 2015-11-09 DIAGNOSIS — H43391 Other vitreous opacities, right eye: Secondary | ICD-10-CM | POA: Diagnosis not present

## 2015-11-09 DIAGNOSIS — Z8669 Personal history of other diseases of the nervous system and sense organs: Secondary | ICD-10-CM | POA: Diagnosis not present

## 2015-11-09 DIAGNOSIS — H33302 Unspecified retinal break, left eye: Secondary | ICD-10-CM | POA: Diagnosis not present

## 2015-11-09 DIAGNOSIS — H35371 Puckering of macula, right eye: Secondary | ICD-10-CM | POA: Diagnosis not present

## 2015-11-10 DIAGNOSIS — Z1212 Encounter for screening for malignant neoplasm of rectum: Secondary | ICD-10-CM | POA: Diagnosis not present

## 2015-12-16 ENCOUNTER — Telehealth: Payer: Self-pay | Admitting: Neurology

## 2015-12-16 DIAGNOSIS — G4733 Obstructive sleep apnea (adult) (pediatric): Secondary | ICD-10-CM

## 2015-12-16 DIAGNOSIS — Z9989 Dependence on other enabling machines and devices: Principal | ICD-10-CM

## 2015-12-16 NOTE — Telephone Encounter (Signed)
Pt is requesting a new mask. The one he is using leaks air at the forehead and makes noise. He is requesting the same mask:  [facility], he got this in 2015.

## 2015-12-17 NOTE — Telephone Encounter (Signed)
Ok for new orders to [facility]?

## 2015-12-17 NOTE — Telephone Encounter (Signed)
Mary with [facility] has been notified.

## 2015-12-17 NOTE — Telephone Encounter (Signed)
Ordered supplies, new mask.

## 2015-12-28 ENCOUNTER — Encounter: Payer: Self-pay | Admitting: Neurology

## 2015-12-28 ENCOUNTER — Telehealth: Payer: Self-pay | Admitting: Neurology

## 2015-12-28 ENCOUNTER — Ambulatory Visit (INDEPENDENT_AMBULATORY_CARE_PROVIDER_SITE_OTHER): Payer: Medicare Other | Admitting: Neurology

## 2015-12-28 VITALS — BP 130/66 | HR 72 | Resp 18 | Ht 72.0 in | Wt 256.0 lb

## 2015-12-28 DIAGNOSIS — R51 Headache: Secondary | ICD-10-CM | POA: Diagnosis not present

## 2015-12-28 DIAGNOSIS — Z789 Other specified health status: Secondary | ICD-10-CM | POA: Diagnosis not present

## 2015-12-28 DIAGNOSIS — G4733 Obstructive sleep apnea (adult) (pediatric): Secondary | ICD-10-CM | POA: Diagnosis not present

## 2015-12-28 DIAGNOSIS — R519 Headache, unspecified: Secondary | ICD-10-CM

## 2015-12-28 NOTE — Telephone Encounter (Signed)
I sent [facility] with [facility] a message to check on the status of this.

## 2015-12-28 NOTE — Patient Instructions (Signed)
We may have to switch you to another mask to use with your BiPAP machine.   We will get in touch with your DME company, [facility].  Follow up in 6 months, continue topamax at the current dose.

## 2015-12-28 NOTE — Telephone Encounter (Signed)
Please get in touch with DME company regarding new mask needed. I placed an order on 12/16/2015. He states he did not get a new mask. Please find out if they need any additional records from [place] as this is what the patient stated.

## 2015-12-28 NOTE — Progress Notes (Signed)
Subjective:    Patient ID: Keith [name]. is a 70 y.o. male.  HPI     Interim history:   Keith [name] is a very pleasant 70 year old right-handed gentleman with an underlying medical history of obesity, BPH, bladder stones, type 2 diabetes, degenerative joint disease and chronic back pain, hyperlipidemia, chronic rhinitis, depression, and migraine headaches, who presents for follow-up consultation of his OSA and headaches. I last saw him on 08/19/2015, at which time he was referred for recurrent headaches. He had ophthalmology workup. He was on Topamax 25 mg twice daily and I suggested he increase this gradually to 50 mg twice daily.  Today, 12/28/2015: I reviewed his BiPAP compliance data from 09/28/2015 through 12/26/2015 which is a total of 90 days during which time he used his machine 60 days with complaints percentage of 46%, pressure of 22/18 with a rate of 12. Residual AHI 3.7, leak quite high. He has essentially stopped using his machine at the end of March.  Today, 12/28/2015: He reports that he was not able to use BiPAP recently. The mask is leaking too much. Of note, he has not received any mask. He said he had to pay for it out of pocket and did not do so. He was given a sample mask we had last year but he felt that the fit overall was not as good as his original fit life fullface mask and this has been several years old and has obvious signs of wear and tear and cracks. I renewed his supply order earlier this month on 12/16/2015 and orders were sent to his DME company. We will get in touch with his DME company regarding supplies and getting him fitted for a different fullface mask that he could potentially tolerate.headaches are under control. He takes one pill of Topamax in the morning and 2 at night, did not increase it and refills are provided by PCP.  Previously:   06/24/2015 for his complex sleep apnea for which he is on treatment with BiPAP therapy. He was not fully compliant with  treatment at the time. He reported that his wife had to have spine surgery at [facility] and he was staying with her quite a bit and he could not use his BiPAP. He did note that he slept better when he was using his machine. He had started a new diabetes medication and had lost about 10 pounds. His sugar values improved as well. He presented to the emergency room on 08/05/2015 with intermittent throbbing headaches with visual disturbance. He had a throbbing headache behind his left eye. He had bright spots and what sounded like visual auras. He had decreased vision of the right eye for about a minute. He saw his eye doctor. He had a carotid Doppler study on 08/04/15, which showed less than 40% stenosis of the b/l ICAs. I reviewed the test results.   I also reviewed the emergency room records as well as the neurological consultation note by Dr. Nicole Kindred. During his emergency room stay he had a brain MRI without contrast as well as a brain MRA without contrast: IMPRESSION: 1. No acute intracranial abnormality. 2. Moderate cerebral atrophy. 3. No medium or large vessel intracranial arterial occlusion or significant stenosis.  He was started on Topamax for headache prevention, 25 mg twice daily. He was advised to continue with Plavix once daily.  Blood test results through the emergency room from 08/05/2015 showed: BUN elevated at 27, creatinine elevated at 1.65, CO2 at 20, capillary blood sugar  was 101, urinalysis negative for infection but positive for hemoglobin. CBC showed hemoglobin mildly low at 12.5 and hematocrit mildly low normal at 38.1.   I reviewed his BiPAP compliance data from 07/19/2015 through 08/17/2015 which is a total of 30 days during which time he used his machine 29 days with percent used days greater than 4 hours at 43%, indicating suboptimal compliance with an average usage of only 3 hours and 38 minutes, residual AHI low at 1.9 per hour, leaked low with the 95th percentile at 5.3 L/m on a  pressure of 22/18 with a rate of 12/m.   I saw him on 12/23/2014, at which time he reported that he was not using his BiPAP regularly secondary to a recent cough. He had increased his compliance however. He had some dryness of mouth. He still had nocturia about 3 times per night. He felt that this was actually better. Overall, he was doing fairly well. In the interim, he had left-sided lithotripsy for kidney stones on the left kidney on 01/26/2015.   I reviewed his BiPAP compliance data from 05/24/2015 through 06/22/2015 which is a total of 30 days during which time he used his machine only 16 days with percent used days greater than 4 hours at only 13%, indicating poor compliance with an average usage of 3 hours and 17 minutes but overall daily usage of only 1 hour and 45 minutes, residual AHI at 1.7 per hour, leak acceptable for the 95th percentile at 12.8 L/m on a pressure of 22/18 with a rate of 12.  I saw him on 09/25/2014, at which time he reported still some trouble tolerating his mask. He started using his old mask from years ago and I tried to order it. Overall, he felt reasonably well treated with BiPAP and he was compliant. I suggested a six-month follow-up.  I reviewed his BiPAP ST compliance data from 11/22/2014 through 12/21/2014 which is a total of 30 days during which time he used his mask only 18 days with percent used days greater than 4 hours at only 23%, indicating poor compliance and much worse compliance than before. Average usage for all nights of only 2 hours and 2 minutes, pressure at 22/18 cm with a rate of 12. Residual AHI borderline at 5.9 and leak for the most part acceptable with the 95th percentile at 23.3 L/m.    I saw him on 06/23/2014, at which time he reported using his BiPAP machine regularly. He had left heart catheterization and also went on a 2 day trip to a friend's house where there was no access to electricity in the room he was not using his BiPAP during his  naps. He reported severe mouth dryness at times. His nocturia had improved about 50% he felt. He had some air leak issues from the mask which improved when he changed the size of the mask. He was using a fullface mask. I discussed a sleep study results at length last time. His physical exam was stable. I asked him to continue to use his BiPAP regularly, also during his naps.  I reviewed his compliance data from 08/23/2014 through 09/21/2014 which is a total of 30 days during which time he used his BiPAP every night except for 3 nights. Percent used days greater than 4 hours was 80%, indicating very good compliance, average usage of 5 hours and 26 minutes, setting of BiPAP ST at 22/18 with a rate of 12, residual AHI acceptable at 3.6 per hour and leak  acceptable with the 95th percentile at 15.1 L/m.  I first met him on 03/13/2014, at which time he reported a prior diagnosis with obstructive sleep apnea over 25 years ago. He had been using CPAP off and on for years with some improvement in snoring and in his sleep reported. He reported purchasing his own CPAP machine and had brought it to his appointment last time, which is a ResMed S6, without a humidifier and he had a large [employer] FFM, as he is a mouth breather. As he had not had a reevaluation of his sleep apnea, I suggested he return for a sleep study. He had a split-night sleep study on 04/29/2014 as well as a full night titration study subsequently on 05/19/2014 and I went over his sleep study results with him in detail today. His split-night sleep study from 04/29/2014 showed a baseline sleep efficiency at 73.9% with a latency sleep of 11.5 minutes and wake after sleep onset of 32 minutes with moderate to severe sleep fragmentation noted. He had an elevated arousal index. He had an elevated percentage of stage I and stage II sleep, and absence of dream sleep. He had multifocal PVCs. He had a brief 6 beat run of what appeared to be ventricular  tachycardia followed by multifocal PVCs. Severe PLMS were noted with very little arousals. He had a total of 1 obstructive to mixed in 57 central apneas as well as 42 obstructive and 14 central hypopneas with an elevated AHI of 56.6 per hour. Baseline oxygen saturation was 91%, nadir was 78%. He was therefore started on CPAP. This was started at 5 cm and increased to 6 cm then decreased again to 4 cm because he had ongoing central sleep disordered breathing. His AHI remained high. He had an increased and deepened dream sleep. His baseline oxygen saturation was slightly improved at 92%. Nadir was 81%. He was switched to BiPAP therapy first at 10/6 cm and titrated gradually. He still had significant central sleep apneas. He was switched to BiPAP ST with a final pressure of 16/12 at a rate of 10. However, on the final settings he still had an AHI of 11.7 per hour. I suggested he start BiPAP ST at home but was also advised to return for a full night BiPAP ST titration for proper optimalization of his treatment settings. He had a BiPAP study on 05/19/2014. Sleep efficiency was 87.6% with a latency to sleep of 14.5 minutes and wake after sleep onset of 46.5 minutes with mild to moderate sleep fragmentation noted. Arousal index was mildly elevated. He had an increased percentage of light stage sleep, nearly 5% of deep sleep, and a decreased percentage of dream sleep at only 3.2%. Latency to REM sleep was very prolonged at 350.5 minutes. Moderate periodic leg movements were noted at 36.8 per hour, resulting in an arousal index of 4.2 per hour. Rare PVCs were noted and overall his EKG looked much improved from his original sleep study from 04/29/2014. He had a total of one central apnea, 19 obstructive hypopneas and one mixed hypopneas for the study. Baseline oxygen saturation was much improved at 95% with a nadir of 80%. On the final setting his oxygen saturation remained above 90%. Snoring was eliminated. BiPAP ST was  started at 10/6 cm with a rate of 10 using a large fullface mask. A chin strap was needed due to residual mouth opening. BiPAP was titrated to a pressure of 22/18 with a rate of 12 per minute. On  this final setting his AHI was 0.8 per hour.   I reviewed his compliance data from 05/21/2014 through 06/19/2014 which is a total of 30 days during which time he used his machine every night, percent used days greater than 4 hours was 90%, indicating excellent compliance, settings of 22/18 with a rate of 12 with a residual AHI of 6.9 per hour but leak was at times high with the 95th percentile 42.3 L per minute. Average usage of 6 hours and 11 minutes. More recently in the last 10-14 days his sleep had improved. In the interim he had a LHC on 06/17/14.    His Past Medical History Is Significant For: Past Medical History  Diagnosis Date  . Hypertension   . Benign prostatic hypertrophy     takes Flomax daily  . ED (erectile dysfunction)   . Complication of anesthesia     hard to wake up  . Hyperlipidemia   . Arthritis     bil knees  . Anxiety     takes Celexa  . Spinal headache   . PONV (postoperative nausea and vomiting)   . Diabetes mellitus without complication ([place])   . BPH with urinary obstruction 06/25/2013    127cc prostate with large middle lobe and bladder stones.   . Bladder stones 06/25/2013    1.9cm bladder stone.  . Anginal pain ([place])   . Sleep apnea     uses bipap machine    His Past Surgical History Is Significant For: Past Surgical History  Procedure Laterality Date  . Colonoscopy    . Knee arthroscopy  09/07/2010    left knee  . Cataract extraction w/ intraocular lens  implant, bilateral  03/13/2003  . Retinal detachment repair w/ scleral buckle le  10/14/2002    Dr Zadie Rhine  . Knee arthroscopy  05/27/2000    left knee  . Tonsillectomy      age 30  . Vasectomy  1982  . Total knee arthroplasty  08/01/2011    Procedure: TOTAL KNEE ARTHROPLASTY;  Surgeon: Lorn Junes, MD;  Location: [facility];  Service: Orthopedics;  Laterality: Right;  TOTAL KNEE ARTHROPLASTY  RIGHT SIDE  . Cystoscopy with litholapaxy N/A 06/25/2013    Procedure: CYSTOSCOPY WITH LITHOLAPAXY;  Surgeon: [name], MD;  Location: [facility];  Service: Urology;  Laterality: N/A;  . Transurethral resection of prostate N/A 06/25/2013    Procedure: TRANSURETHRAL RESECTION OF THE PROSTATE WITH GYRUS INSTRUMENTS;  Surgeon: [name], MD;  Location: [facility];  Service: Urology;  Laterality: N/A;  . Cardiac catheterization  06-15-14  . Left heart catheterization with coronary angiogram N/A 06/17/2014    Procedure: LEFT HEART CATHETERIZATION WITH CORONARY ANGIOGRAM;  Surgeon: Laverda Page, MD;  [facility] C[facility];  Service: Cardiovascular;  Laterality: N/A;    His Family History Is Significant For: Family History  Problem Relation Age of Onset  . Arthritis Other   . Cancer Other     breast, skin  . Coronary artery disease Other   . Heart disease Other   . Hypertension Mother   . Heart failure Father   . Anesthesia problems Neg Hx   . Hypotension Neg Hx   . Malignant hyperthermia Neg Hx   . Pseudochol deficiency Neg Hx     His Social History Is Significant For: Social History   Social History  . Marital Status: Married    Spouse Name: N/A  . Number of Children: N/A  . Years of Education:  N/A   Social History Main Topics  . Smoking status: Never Smoker   . Smokeless tobacco: None  . Alcohol Use: No  . Drug Use: No  . Sexual Activity: Not Asked   Other Topics Concern  . None   Social History Narrative   Right handed.     His Allergies Are: PCN  His Current Medications Are:  Outpatient Encounter Prescriptions as of 12/28/2015  Medication Sig  . aspirin EC 81 MG tablet Take 81 mg by mouth daily.  Marland Kitchen atorvastatin (LIPITOR) 20 MG tablet Take 20 mg by mouth daily.  . Cholecalciferol (VITAMIN D) 2000 UNITS tablet Take 2,000 Units by mouth daily.  . citalopram (CELEXA) 40  MG tablet Take 40 mg by mouth daily.  . clopidogrel (PLAVIX) 75 MG tablet Take 75 mg by mouth daily.  Marland Kitchen FARXIGA 10 MG TABS tablet Take 10 mg by mouth daily.   Marland Kitchen lisinopril (PRINIVIL,ZESTRIL) 10 MG tablet Take 10 mg by mouth daily.  . meloxicam (MOBIC) 15 MG tablet Take 15 mg by mouth daily.  . metFORMIN (GLUCOPHAGE) 1000 MG tablet Take 1,000 mg by mouth 2 (two) times daily with a meal.  . metoprolol tartrate (LOPRESSOR) 25 MG tablet Take 25 mg by mouth 2 (two) times daily.  Marland Kitchen NITROSTAT 0.4 MG SL tablet Place 0.4 mg under the tongue every 5 (five) minutes as needed for chest pain.   Marland Kitchen senna-docusate (SENOKOT-S) 8.6-50 MG per tablet Take 1 tablet by mouth daily as needed for mild constipation.  . topiramate (TOPAMAX) 25 MG tablet Take 1 in AM and 2 pill at night for 1 week, then 2 pills twice daily thereafter.  . [DISCONTINUED] HYDROcodone-acetaminophen (NORCO/VICODIN) 5-325 MG per tablet Take 1-2 tablets by mouth every 6 (six) hours as needed. (Patient taking differently: Take 1-2 tablets by mouth every 6 (six) hours as needed for moderate pain or severe pain. )   No facility-administered encounter medications on file as of 12/28/2015.  :  Review of Systems:  Out of a complete 14 point review of systems, all are reviewed and negative with the exception of these symptoms as listed below:   Review of Systems  Neurological:       Patient reports that he is having trouble with his PAP machine. States that his mask leaks and has trouble sleeping with it.     Objective:  Neurologic Exam  Physical Exam Physical Examination:   Filed Vitals:   12/28/15 1154  BP: 130/66  Pulse: 72  Resp: 18   General Examination: The patient is a very pleasant 70 y.o. male in no acute distress. He appears well-developed and well-nourished and well groomed.   HEENT: Normocephalic, atraumatic, pupils are equal, round and reactive to light and accommodation. Extraocular tracking is good without limitation to  gaze excursion or nystagmus noted. Normal smooth pursuit is noted. Hearing is grossly intact. Face is symmetric with normal facial animation and normal facial sensation. Speech is clear with no dysarthria noted. There is no hypophonia. There is no lip, neck/head, jaw or voice tremor. Neck is supple with full range of passive and active motion. There are no carotid bruits on auscultation. Oropharynx exam reveals: moderate mouth dryness, adequate dental hygiene and moderate airway crowding, due to redundant soft palate. Mallampati is class II. Tongue protrudes centrally and palate elevates symmetrically. Tonsils are absent. He has a Mild to moderate overbite.   Chest: Clear to auscultation without wheezing, rhonchi or crackles noted.  Heart: S1+S2+0, regular and normal  without murmurs, rubs or gallops noted.   Abdomen: Soft, non-tender and non-distended with normal bowel sounds appreciated on auscultation.  Extremities: There is no pitting edema in the distal lower extremities. Pedal pulses are intact.  Skin: Warm and dry without trophic changes noted. There are no varicose veins except mild on the L.  Musculoskeletal: exam reveals no obvious joint deformities, tenderness or joint swelling or erythema. He had R knee replacement about 4 years ago.   Neurologically:  Mental status: The patient is awake, alert and oriented in all 4 spheres. His immediate and remote memory, attention, language skills and fund of knowledge are appropriate. There is no evidence of aphasia, agnosia, apraxia or anomia. Speech is clear with normal prosody and enunciation. Thought process is linear. Mood is normal and affect is blunted.  Cranial nerves II - XII are as described above under HEENT exam. In addition: shoulder shrug is normal with equal shoulder height noted. Motor exam: Normal bulk, strength and tone is noted. There is no drift, tremor or rebound. Romberg is negative. Reflexes are 2+ throughout with the exception  of trace reflex of his in the right knee, which is s/p TKA. Fine motor skills are intact.  Cerebellar testing: No dysmetria or intention tremor on finger to nose testing. Sensory exam: intact to light touch.  Gait, station and balance: He stands easily. No veering to one side is noted. No leaning to one side is noted. Posture is age-appropriate and stance is narrow based. Gait shows normal stride length and normal pace. No problems turning are noted. He turns en bloc. Tandem walk is mildly difficult for him, unchanged from last time.             Assessment and Plan:  In summary, Keith [name]. is a very pleasant 71 year old male with an underlying medical history of obesity, BPH, bladder stones, type 2 diabetes, degenerative joint disease and chronic back pain, hyperlipidemia, chronic rhinitis, depression, headaches, who presents for follow-up consultation of his OSA. Headaches have been stable. He increased Topamax slightly to 25 mg in the morning and 50 at night. He has not been able to use his BiPAP very much recently this month because of problems with his mask. We will get in touch with his DME company regarding refit for a different type of full face mask that he could tolerate. I have placed an order previously earlier this month for supplies. He feels stable regarding his headaches and is advised to continue with the current dose of Topamax, he did not need a refill today. I would like to see him back in 6 months, sooner if needed. I answered all his questions today and he was in agreement. I spent 20 minutes in total face-to-face time with the patient, more than 50% of which was spent in counseling and coordination of care, reviewing test results, reviewing medication and discussing or reviewing the diagnosis of recurrent HAs, CSA and OSA, the prognosis and treatment options.

## 2016-05-10 DIAGNOSIS — E1129 Type 2 diabetes mellitus with other diabetic kidney complication: Secondary | ICD-10-CM | POA: Diagnosis not present

## 2016-05-10 DIAGNOSIS — Z6836 Body mass index (BMI) 36.0-36.9, adult: Secondary | ICD-10-CM | POA: Diagnosis not present

## 2016-05-10 DIAGNOSIS — M25562 Pain in left knee: Secondary | ICD-10-CM | POA: Diagnosis not present

## 2016-05-10 DIAGNOSIS — M25561 Pain in right knee: Secondary | ICD-10-CM | POA: Diagnosis not present

## 2016-05-10 DIAGNOSIS — I10 Essential (primary) hypertension: Secondary | ICD-10-CM | POA: Diagnosis not present

## 2016-05-10 DIAGNOSIS — I2510 Atherosclerotic heart disease of native coronary artery without angina pectoris: Secondary | ICD-10-CM | POA: Diagnosis not present

## 2016-05-10 DIAGNOSIS — M79671 Pain in right foot: Secondary | ICD-10-CM | POA: Diagnosis not present

## 2016-05-10 DIAGNOSIS — I7389 Other specified peripheral vascular diseases: Secondary | ICD-10-CM | POA: Diagnosis not present

## 2016-05-10 DIAGNOSIS — G4733 Obstructive sleep apnea (adult) (pediatric): Secondary | ICD-10-CM | POA: Diagnosis not present

## 2016-05-10 DIAGNOSIS — Z23 Encounter for immunization: Secondary | ICD-10-CM | POA: Diagnosis not present

## 2016-05-10 DIAGNOSIS — E784 Other hyperlipidemia: Secondary | ICD-10-CM | POA: Diagnosis not present

## 2016-06-26 ENCOUNTER — Encounter: Payer: Self-pay | Admitting: Neurology

## 2016-06-28 ENCOUNTER — Encounter: Payer: Self-pay | Admitting: Neurology

## 2016-06-28 ENCOUNTER — Ambulatory Visit (INDEPENDENT_AMBULATORY_CARE_PROVIDER_SITE_OTHER): Payer: Medicare Other | Admitting: Neurology

## 2016-06-28 VITALS — BP 142/62 | HR 68 | Resp 18 | Ht 72.0 in | Wt 273.0 lb

## 2016-06-28 DIAGNOSIS — L570 Actinic keratosis: Secondary | ICD-10-CM | POA: Diagnosis not present

## 2016-06-28 DIAGNOSIS — L821 Other seborrheic keratosis: Secondary | ICD-10-CM | POA: Diagnosis not present

## 2016-06-28 DIAGNOSIS — Z85828 Personal history of other malignant neoplasm of skin: Secondary | ICD-10-CM | POA: Diagnosis not present

## 2016-06-28 DIAGNOSIS — L738 Other specified follicular disorders: Secondary | ICD-10-CM | POA: Diagnosis not present

## 2016-06-28 DIAGNOSIS — G8929 Other chronic pain: Secondary | ICD-10-CM

## 2016-06-28 DIAGNOSIS — M25561 Pain in right knee: Secondary | ICD-10-CM

## 2016-06-28 DIAGNOSIS — R519 Headache, unspecified: Secondary | ICD-10-CM

## 2016-06-28 DIAGNOSIS — R51 Headache: Secondary | ICD-10-CM

## 2016-06-28 DIAGNOSIS — L720 Epidermal cyst: Secondary | ICD-10-CM | POA: Diagnosis not present

## 2016-06-28 DIAGNOSIS — G4733 Obstructive sleep apnea (adult) (pediatric): Secondary | ICD-10-CM | POA: Diagnosis not present

## 2016-06-28 DIAGNOSIS — M25562 Pain in left knee: Secondary | ICD-10-CM

## 2016-06-28 NOTE — Progress Notes (Signed)
Subjective:    Patient ID: Keith [name]. is a 70 y.o. male.  HPI     Interim history:   Keith [name] is a very pleasant 70 year old right-handed gentleman with an underlying medical history of obesity, BPH, bladder stones, type 2 diabetes, degenerative joint disease and chronic back pain, hyperlipidemia, chronic rhinitis, depression, and migraine headaches, who presents for follow-up consultation of his OSA, on BiPAP ST, and his recurrent headaches. I last saw him on 12/28/2015, at which time he was no longer fully compliant with BiPAP ST. He had essentially stopped using his machine in March 2017 and reported that he was not able to use BiPAP, he had issues with tolerance to the high pressure and was not able to get replacement supplies. I had renewed his supplies in April 2017. He was on Topamax but had not increased the dose. His headaches were stable. I suggested he continue with his current dose. I prescribed new supplies for his BiPAP and suggested he try a different full face mask.  Today, 06/28/2016: I reviewed his BiPAP compliance data from 05/28/2016 through 06/26/2016 which is a total of 30 days, during which time he used his machine every night with percent used days greater than 4 hours at 83%, indicating very good compliance with an average usage of 5 hours and 40 minutes, residual AHI 4.3 per hour, leak either the 95th percentile at 61.2 L/m on a pressure of 22/18 with a rate of 12.  Today, 06/28/2016: He reports doing better with BiPAP, feels the leak still high, and mouth is perpetually dry. Has b/l knee pain, had R TKA in 2006 and 2 arthroscopic surgeries on the L, has new patient appointment with Dr. Alvan Dame next week. Has adapted to the new FFM reasonably well and Compliance is much better than last time! High leak has always been a problem. Has gained weight. Headaches are stable, is taking topiramate 25 mg twice a day at this time, does note some taste changes with it.    Previously:    I saw him on 08/19/2015, at which time he was referred for recurrent headaches. He had ophthalmology workup. He was on Topamax 25 mg twice daily and I suggested he increase this gradually to 50 mg twice daily.   I reviewed his BiPAP compliance data from 09/28/2015 through 12/26/2015 which is a total of 90 days during which time he used his machine 60 days with complaints percentage of 46%, pressure of 22/18 with a rate of 12. Residual AHI 3.7, leak quite high. He has essentially stopped using his machine at the end of March.   06/24/2015 for his complex sleep apnea for which he is on treatment with BiPAP therapy. He was not fully compliant with treatment at the time. He reported that his wife had to have spine surgery at [facility] and he was staying with her quite a bit and he could not use his BiPAP. He did note that he slept better when he was using his machine. He had started a new diabetes medication and had lost about 10 pounds. His sugar values improved as well. He presented to the emergency room on 08/05/2015 with intermittent throbbing headaches with visual disturbance. He had a throbbing headache behind his left eye. He had bright spots and what sounded like visual auras. He had decreased vision of the right eye for about a minute. He saw his eye doctor. He had a carotid Doppler study on 08/04/15, which showed less than 40% stenosis  of the b/l ICAs. I reviewed the test results.   I also reviewed the emergency room records as well as the neurological consultation note by Dr. Nicole Kindred. During his emergency room stay he had a brain MRI without contrast as well as a brain MRA without contrast: IMPRESSION: 1. No acute intracranial abnormality. 2. Moderate cerebral atrophy. 3. No medium or large vessel intracranial arterial occlusion or significant stenosis.   He was started on Topamax for headache prevention, 25 mg twice daily. He was advised to continue with Plavix once daily.   Blood  test results through the emergency room from 08/05/2015 showed: BUN elevated at 27, creatinine elevated at 1.65, CO2 at 20, capillary blood sugar was 101, urinalysis negative for infection but positive for hemoglobin. CBC showed hemoglobin mildly low at 12.5 and hematocrit mildly low normal at 38.1.    I reviewed his BiPAP compliance data from 07/19/2015 through 08/17/2015 which is a total of 30 days during which time he used his machine 29 days with percent used days greater than 4 hours at 43%, indicating suboptimal compliance with an average usage of only 3 hours and 38 minutes, residual AHI low at 1.9 per hour, leaked low with the 95th percentile at 5.3 L/m on a pressure of 22/18 with a rate of 12/m.    I saw him on 12/23/2014, at which time he reported that he was not using his BiPAP regularly secondary to a recent cough. He had increased his compliance however. He had some dryness of mouth. He still had nocturia about 3 times per night. He felt that this was actually better. Overall, he was doing fairly well. In the interim, he had left-sided lithotripsy for kidney stones on the left kidney on 01/26/2015.    I reviewed his BiPAP compliance data from 05/24/2015 through 06/22/2015 which is a total of 30 days during which time he used his machine only 16 days with percent used days greater than 4 hours at only 13%, indicating poor compliance with an average usage of 3 hours and 17 minutes but overall daily usage of only 1 hour and 45 minutes, residual AHI at 1.7 per hour, leak acceptable for the 95th percentile at 12.8 L/m on a pressure of 22/18 with a rate of 12.   I saw him on 09/25/2014, at which time he reported still some trouble tolerating his mask. He started using his old mask from years ago and I tried to order it. Overall, he felt reasonably well treated with BiPAP and he was compliant. I suggested a six-month follow-up.   I reviewed his BiPAP ST compliance data from 11/22/2014 through  12/21/2014 which is a total of 30 days during which time he used his mask only 18 days with percent used days greater than 4 hours at only 23%, indicating poor compliance and much worse compliance than before. Average usage for all nights of only 2 hours and 2 minutes, pressure at 22/18 cm with a rate of 12. Residual AHI borderline at 5.9 and leak for the most part acceptable with the 95th percentile at 23.3 L/m.      I saw him on 06/23/2014, at which time he reported using his BiPAP machine regularly. He had left heart catheterization and also went on a 2 day trip to a friend's house where there was no access to electricity in the room he was not using his BiPAP during his naps. He reported severe mouth dryness at times. His nocturia had improved about 50% he  felt. He had some air leak issues from the mask which improved when he changed the size of the mask. He was using a fullface mask. I discussed a sleep study results at length last time. His physical exam was stable. I asked him to continue to use his BiPAP regularly, also during his naps.   I reviewed his compliance data from 08/23/2014 through 09/21/2014 which is a total of 30 days during which time he used his BiPAP every night except for 3 nights. Percent used days greater than 4 hours was 80%, indicating very good compliance, average usage of 5 hours and 26 minutes, setting of BiPAP ST at 22/18 with a rate of 12, residual AHI acceptable at 3.6 per hour and leak acceptable with the 95th percentile at 15.1 L/m.   I first met him on 03/13/2014, at which time he reported a prior diagnosis with obstructive sleep apnea over 25 years ago. He had been using CPAP off and on for years with some improvement in snoring and in his sleep reported. He reported purchasing his own CPAP machine and had brought it to his appointment last time, which is a ResMed S6, without a humidifier and he had a large [employer] FFM, as he is a mouth breather. As he had  not had a reevaluation of his sleep apnea, I suggested he return for a sleep study. He had a split-night sleep study on 04/29/2014 as well as a full night titration study subsequently on 05/19/2014 and I went over his sleep study results with him in detail today. His split-night sleep study from 04/29/2014 showed a baseline sleep efficiency at 73.9% with a latency sleep of 11.5 minutes and wake after sleep onset of 32 minutes with moderate to severe sleep fragmentation noted. He had an elevated arousal index. He had an elevated percentage of stage I and stage II sleep, and absence of dream sleep. He had multifocal PVCs. He had a brief 6 beat run of what appeared to be ventricular tachycardia followed by multifocal PVCs. Severe PLMS were noted with very little arousals. He had a total of 1 obstructive to mixed in 57 central apneas as well as 42 obstructive and 14 central hypopneas with an elevated AHI of 56.6 per hour. Baseline oxygen saturation was 91%, nadir was 78%. He was therefore started on CPAP. This was started at 5 cm and increased to 6 cm then decreased again to 4 cm because he had ongoing central sleep disordered breathing. His AHI remained high. He had an increased and deepened dream sleep. His baseline oxygen saturation was slightly improved at 92%. Nadir was 81%. He was switched to BiPAP therapy first at 10/6 cm and titrated gradually. He still had significant central sleep apneas. He was switched to BiPAP ST with a final pressure of 16/12 at a rate of 10. However, on the final settings he still had an AHI of 11.7 per hour. I suggested he start BiPAP ST at home but was also advised to return for a full night BiPAP ST titration for proper optimalization of his treatment settings. He had a BiPAP study on 05/19/2014. Sleep efficiency was 87.6% with a latency to sleep of 14.5 minutes and wake after sleep onset of 46.5 minutes with mild to moderate sleep fragmentation noted. Arousal index was mildly  elevated. He had an increased percentage of light stage sleep, nearly 5% of deep sleep, and a decreased percentage of dream sleep at only 3.2%. Latency to REM sleep was very  prolonged at 350.5 minutes. Moderate periodic leg movements were noted at 36.8 per hour, resulting in an arousal index of 4.2 per hour. Rare PVCs were noted and overall his EKG looked much improved from his original sleep study from 04/29/2014. He had a total of one central apnea, 19 obstructive hypopneas and one mixed hypopneas for the study. Baseline oxygen saturation was much improved at 95% with a nadir of 80%. On the final setting his oxygen saturation remained above 90%. Snoring was eliminated. BiPAP ST was started at 10/6 cm with a rate of 10 using a large fullface mask. A chin strap was needed due to residual mouth opening. BiPAP was titrated to a pressure of 22/18 with a rate of 12 per minute. On this final setting his AHI was 0.8 per hour.   I reviewed his compliance data from 05/21/2014 through 06/19/2014 which is a total of 30 days during which time he used his machine every night, percent used days greater than 4 hours was 90%, indicating excellent compliance, settings of 22/18 with a rate of 12 with a residual AHI of 6.9 per hour but leak was at times high with the 95th percentile 42.3 L per minute. Average usage of 6 hours and 11 minutes. More recently in the last 10-14 days his sleep had improved. In the interim he had a LHC on 06/17/14.    His Past Medical History Is Significant For: Past Medical History:  Diagnosis Date  . Anginal pain ([place])   . Anxiety    takes Celexa  . Arthritis    bil knees  . Benign prostatic hypertrophy    takes Flomax daily  . Bladder stones 06/25/2013   1.9cm bladder stone.  Marland Kitchen BPH with urinary obstruction 06/25/2013   127cc prostate with large middle lobe and bladder stones.   . Complication of anesthesia    hard to wake up  . Diabetes mellitus without complication ([place])   . ED  (erectile dysfunction)   . Hyperlipidemia   . Hypertension   . PONV (postoperative nausea and vomiting)   . Sleep apnea    uses bipap machine  . Spinal headache     His Past Surgical History Is Significant For: Past Surgical History:  Procedure Laterality Date  . CARDIAC CATHETERIZATION  06-15-14  . CATARACT EXTRACTION W/ INTRAOCULAR LENS  IMPLANT, BILATERAL  03/13/2003  . COLONOSCOPY    . CYSTOSCOPY WITH LITHOLAPAXY N/A 06/25/2013   Procedure: CYSTOSCOPY WITH LITHOLAPAXY;  Surgeon: [name], MD;  Location: [facility];  Service: Urology;  Laterality: N/A;  . KNEE ARTHROSCOPY  09/07/2010   left knee  . KNEE ARTHROSCOPY  05/27/2000   left knee  . LEFT HEART CATHETERIZATION WITH CORONARY ANGIOGRAM N/A 06/17/2014   Procedure: LEFT HEART CATHETERIZATION WITH CORONARY ANGIOGRAM;  Surgeon: Laverda Page, MD;  Location: [facility] CATH LAB;  Service: Cardiovascular;  Laterality: N/A;  . RETINAL DETACHMENT REPAIR W/ SCLERAL BUCKLE LE  10/14/2002   Dr Zadie Rhine  . TONSILLECTOMY     age 14  . TOTAL KNEE ARTHROPLASTY  08/01/2011   Procedure: TOTAL KNEE ARTHROPLASTY;  Surgeon: Lorn Junes, MD;  Location: [facility];  Service: Orthopedics;  Laterality: Right;  TOTAL KNEE ARTHROPLASTY  RIGHT SIDE  . TRANSURETHRAL RESECTION OF PROSTATE N/A 06/25/2013   Procedure: TRANSURETHRAL RESECTION OF THE PROSTATE WITH GYRUS INSTRUMENTS;  Surgeon: [name], MD;  Location: [facility];  Service: Urology;  Laterality: N/A;  . [place]    His Family History Is  Significant For: Family History  Problem Relation Age of Onset  . Arthritis Other   . Cancer Other     breast, skin  . Coronary artery disease Other   . Heart disease Other   . Hypertension Mother   . Heart failure Father   . Anesthesia problems Neg Hx   . Hypotension Neg Hx   . Malignant hyperthermia Neg Hx   . Pseudochol deficiency Neg Hx     His Social History Is Significant For: Social History   Social History  . Marital status: Married     Spouse name: N/A  . Number of children: N/A  . Years of education: N/A   Social History Main Topics  . Smoking status: Never Smoker  . Smokeless tobacco: None  . Alcohol use No  . Drug use: No  . Sexual activity: Not Asked   Other Topics Concern  . None   Social History Narrative   Right handed.     His Allergies Are:  See chart:   His Current Medications Are:  Outpatient Encounter Prescriptions as of 06/28/2016  Medication Sig  . aspirin EC 81 MG tablet Take 81 mg by mouth daily.  Marland Kitchen atorvastatin (LIPITOR) 20 MG tablet Take 20 mg by mouth daily.  . Cholecalciferol (VITAMIN D) 2000 UNITS tablet Take 2,000 Units by mouth daily.  . citalopram (CELEXA) 40 MG tablet Take 40 mg by mouth daily.  . clopidogrel (PLAVIX) 75 MG tablet Take 75 mg by mouth daily.  Marland Kitchen FARXIGA 10 MG TABS tablet Take 10 mg by mouth daily.   Marland Kitchen lisinopril (PRINIVIL,ZESTRIL) 10 MG tablet Take 10 mg by mouth daily.  . meloxicam (MOBIC) 15 MG tablet Take 15 mg by mouth daily.  . metFORMIN (GLUCOPHAGE) 1000 MG tablet Take 1,000 mg by mouth 2 (two) times daily with a meal.  . metoprolol tartrate (LOPRESSOR) 25 MG tablet Take 25 mg by mouth 2 (two) times daily.  Marland Kitchen NITROSTAT 0.4 MG SL tablet Place 0.4 mg under the tongue every 5 (five) minutes as needed for chest pain.   Marland Kitchen senna-docusate (SENOKOT-S) 8.6-50 MG per tablet Take 1 tablet by mouth daily as needed for mild constipation.  . topiramate (TOPAMAX) 25 MG tablet Take 1 in AM and 2 pill at night for 1 week, then 2 pills twice daily thereafter.   No facility-administered encounter medications on file as of 06/28/2016.   :  Review of Systems:  Out of a complete 14 point review of systems, all are reviewed and negative with the exception of these symptoms as listed below:  Review of Systems  Neurological:       Patient states that he is doing well with BiPAP. No new concerns.     Objective:  Neurologic Exam  Physical Exam Physical Examination:   Vitals:    06/28/16 1303  BP: (!) 142/62  Pulse: 68  Resp: 18   General Examination: The patient is a very pleasant 70 y.o. male in no acute distress. He appears well-developed and well-nourished and well groomed.   HEENT: Normocephalic, atraumatic, pupils are equal, round and reactive to light and accommodation. Extraocular tracking is good without limitation to gaze excursion or nystagmus noted. Status post bilateral cataract extractions. Normal smooth pursuit is noted. Hearing is grossly intact. Face is symmetric with normal facial animation and normal facial sensation. Speech is clear with no dysarthria noted. There is no hypophonia. There is no lip, neck/head, jaw or voice tremor. Neck is supple with full range  of passive and active motion. There are no carotid bruits on auscultation. Oropharynx exam reveals: moderate mouth dryness, adequate dental hygiene and moderate airway crowding, due to redundant soft palate. Mallampati is class II. Tongue protrudes centrally and palate elevates symmetrically. Tonsils are absent. He has a Mild to moderate overbite.   Chest: Clear to auscultation without wheezing, rhonchi or crackles noted.  Heart: S1+S2+0, regular and normal without murmurs, rubs or gallops noted.   Abdomen: Soft, non-tender and non-distended with normal bowel sounds appreciated on auscultation.  Extremities: There is no pitting edema in the distal lower extremities. Pedal pulses are intact.  Skin: Warm and dry without trophic changes noted. There are no varicose veins except mild on the L.  Musculoskeletal: exam reveals no obvious joint deformities, tenderness or joint swelling or erythema. He had R knee replacement, reports b/l knee pain L>R.    Neurologically:  Mental status: The patient is awake, alert and oriented in all 4 spheres. His immediate and remote memory, attention, language skills and fund of knowledge are appropriate. There is no evidence of aphasia, agnosia, apraxia or  anomia. Speech is clear with normal prosody and enunciation. Thought process is linear. Mood is normal and affect is blunted.  Cranial nerves II - XII are as described above under HEENT exam. In addition: shoulder shrug is normal with equal shoulder height noted. Motor exam: Normal bulk, strength and tone is noted. There is no drift, tremor or rebound. Romberg is negative. Reflexes are 2+ throughout with the exception of trace reflex of his in the right knee, which is s/p TKA. Fine motor skills are intact.  Cerebellar testing: No dysmetria or intention tremor on finger to nose testing. Sensory exam: intact to light touch, temp and vibration in the UEs and LEs.  Gait, station and balance: He stands easily. No veering to one side is noted. No leaning to one side is noted. Posture is age-appropriate and stance is narrow based. Gait shows normal stride length and normal pace. No problems turning are noted. He turns en bloc. Tandem walk is mildly difficult for him, unchanged from last time.    Assessment and Plan:  In summary, Ygnacio Fecteau. is a very pleasant 70 year old male with an underlying medical history of obesity, BPH, bladder stones, type 2 diabetes, degenerative joint disease and chronic back pain, hyperlipidemia, chronic rhinitis, depression, headaches, and bilateral knee pain, status post right total knee replacement surgery some 10 years ago and arthroscopic knee surgeries on the left, who presents for follow-up consultation of his OSA treated with BiPAP ST. Headaches have been stable. He decreased Topamax to 25 mg twice a day. He has done better with his BiPAP ST compliance. Leak consistently high, AHI at goal of below 5 per hour. Mouth dryness is a consistent problem as well. He still struggles with the full face mask but at least has been able to adjust to a different type of mask. I commended him for his treatment compliance. I suggested we try to reduce the BiPAP pressure to 20/16 cm from now  for at least one month and monitor his symptoms and compliance download. He is reminded to call or email [place] in about a month and we can check his download after the change in pressure. I placed an order for this and we will fax this to his DME company. Otherwise, in the  has been stable and headaches are under reasonably good control. I will see him back in 6 months, sooner  as needed.  I answered all his questions today and he was in agreement. I spent 25 minutes in total face-to-face time with the patient, more than 50% of which was spent in counseling and coordination of care, reviewing test results, reviewing medication and discussing or reviewing the diagnosis of recurrent HAs, CSA and OSA, the prognosis and treatment options.

## 2016-06-28 NOTE — Patient Instructions (Signed)
Please continue with BiPAP. We will reduce the pressure to 20/16 cm for at least a month and monitor your numbers and symptoms. Please call or email through My Chart in about 1 month so we can pull a download report.   Try to hydrate well and try to lose weight.

## 2016-07-07 DIAGNOSIS — M25562 Pain in left knee: Secondary | ICD-10-CM | POA: Diagnosis not present

## 2016-07-07 DIAGNOSIS — M25561 Pain in right knee: Secondary | ICD-10-CM | POA: Diagnosis not present

## 2016-07-08 ENCOUNTER — Other Ambulatory Visit (HOSPITAL_COMMUNITY): Payer: Self-pay | Admitting: Orthopedic Surgery

## 2016-07-08 DIAGNOSIS — G8929 Other chronic pain: Secondary | ICD-10-CM

## 2016-07-08 DIAGNOSIS — M25562 Pain in left knee: Principal | ICD-10-CM

## 2016-07-20 ENCOUNTER — Encounter (HOSPITAL_COMMUNITY)
Admission: RE | Admit: 2016-07-20 | Discharge: 2016-07-20 | Disposition: A | Discharge status: Home / Self Care | Payer: Medicare Other | Source: Ambulatory Visit | Attending: Orthopedic Surgery | Admitting: Orthopedic Surgery

## 2016-07-20 DIAGNOSIS — M25562 Pain in left knee: Secondary | ICD-10-CM | POA: Insufficient documentation

## 2016-07-20 DIAGNOSIS — G8929 Other chronic pain: Secondary | ICD-10-CM | POA: Diagnosis not present

## 2016-07-20 DIAGNOSIS — M179 Osteoarthritis of knee, unspecified: Secondary | ICD-10-CM | POA: Diagnosis not present

## 2016-07-20 MED ORDER — TECHNETIUM TC 99M MEDRONATE IV KIT
25.0000 | PACK | Freq: Once | INTRAVENOUS | Status: AC | PRN
  Administered 2016-07-20: 25 via INTRAVENOUS

## 2016-07-28 DIAGNOSIS — G8929 Other chronic pain: Secondary | ICD-10-CM | POA: Diagnosis not present

## 2016-07-28 DIAGNOSIS — Z96651 Presence of right artificial knee joint: Secondary | ICD-10-CM | POA: Diagnosis not present

## 2016-07-28 DIAGNOSIS — M25562 Pain in left knee: Secondary | ICD-10-CM | POA: Diagnosis not present

## 2016-07-28 DIAGNOSIS — M1712 Unilateral primary osteoarthritis, left knee: Secondary | ICD-10-CM | POA: Diagnosis not present

## 2016-08-31 DIAGNOSIS — H35411 Lattice degeneration of retina, right eye: Secondary | ICD-10-CM | POA: Diagnosis not present

## 2016-08-31 DIAGNOSIS — H33302 Unspecified retinal break, left eye: Secondary | ICD-10-CM | POA: Diagnosis not present

## 2016-08-31 DIAGNOSIS — H43391 Other vitreous opacities, right eye: Secondary | ICD-10-CM | POA: Diagnosis not present

## 2016-08-31 DIAGNOSIS — H35371 Puckering of macula, right eye: Secondary | ICD-10-CM | POA: Diagnosis not present

## 2016-09-02 ENCOUNTER — Telehealth: Payer: Self-pay | Admitting: Neurology

## 2016-09-02 DIAGNOSIS — R310 Gross hematuria: Secondary | ICD-10-CM | POA: Diagnosis not present

## 2016-09-02 NOTE — Telephone Encounter (Signed)
His jerking and twitching at night time may be from his antidepressant medication, Celexa (citalopram), he appears to be on a fairly high dose. Has he had a recent dose increase? Please advise him to discuss with prescriber (PCP) whether he can have a decrease in dose.  If he is bothered by the leg movements, we may be able to try some medication for restless legs, but I would rather avoid adding more meds to the mix.  Please also make sure to ask if he had thyroid function test (blood test through PCP) this year and that his thyroid function was okay. Please call back to relay info to patient. thx

## 2016-09-02 NOTE — Telephone Encounter (Signed)
Rn call patient about Dr.Athar message about the jerking and twitching at night. Rn explain per Dr. Rexene Alberts she wants him to discuss with his PCP about decrease in the celexa dosage.Also speak with his MD about thyroid function test. Pt stated he had labs in 05/2016.Rn stated it was recommend he contact his PCP about a recheck of thyroid levels. Rn also stated that Dr. Rexene Alberts does not want to add anymore medicines for his legs movements at this time. Pt will call his PCP to discuss Dr. Rexene Alberts recommendations, and verbalized understanding.

## 2016-09-02 NOTE — Telephone Encounter (Signed)
Dr.Atha this is your patient .Please advise thanks.

## 2016-09-02 NOTE — Telephone Encounter (Signed)
Pt called stating for the last 3-4 weeks he has been jerking and twitching mostly at night time. Please call

## 2016-09-13 DIAGNOSIS — R3915 Urgency of urination: Secondary | ICD-10-CM | POA: Diagnosis not present

## 2016-09-13 DIAGNOSIS — R310 Gross hematuria: Secondary | ICD-10-CM | POA: Diagnosis not present

## 2016-09-15 DIAGNOSIS — N200 Calculus of kidney: Secondary | ICD-10-CM | POA: Diagnosis not present

## 2016-09-15 DIAGNOSIS — R310 Gross hematuria: Secondary | ICD-10-CM | POA: Diagnosis not present

## 2016-09-19 DIAGNOSIS — Z6835 Body mass index (BMI) 35.0-35.9, adult: Secondary | ICD-10-CM | POA: Diagnosis not present

## 2016-09-19 DIAGNOSIS — I10 Essential (primary) hypertension: Secondary | ICD-10-CM | POA: Diagnosis not present

## 2016-09-19 DIAGNOSIS — N400 Enlarged prostate without lower urinary tract symptoms: Secondary | ICD-10-CM | POA: Diagnosis not present

## 2016-09-19 DIAGNOSIS — E1129 Type 2 diabetes mellitus with other diabetic kidney complication: Secondary | ICD-10-CM | POA: Diagnosis not present

## 2016-09-19 DIAGNOSIS — M25561 Pain in right knee: Secondary | ICD-10-CM | POA: Diagnosis not present

## 2016-09-19 DIAGNOSIS — G4733 Obstructive sleep apnea (adult) (pediatric): Secondary | ICD-10-CM | POA: Diagnosis not present

## 2016-09-19 DIAGNOSIS — Z1389 Encounter for screening for other disorder: Secondary | ICD-10-CM | POA: Diagnosis not present

## 2016-09-19 DIAGNOSIS — I7389 Other specified peripheral vascular diseases: Secondary | ICD-10-CM | POA: Diagnosis not present

## 2016-09-19 DIAGNOSIS — I2510 Atherosclerotic heart disease of native coronary artery without angina pectoris: Secondary | ICD-10-CM | POA: Diagnosis not present

## 2016-09-19 DIAGNOSIS — R3911 Hesitancy of micturition: Secondary | ICD-10-CM | POA: Diagnosis not present

## 2016-10-14 DIAGNOSIS — N200 Calculus of kidney: Secondary | ICD-10-CM | POA: Diagnosis not present

## 2016-10-14 DIAGNOSIS — R310 Gross hematuria: Secondary | ICD-10-CM | POA: Diagnosis not present

## 2016-10-14 DIAGNOSIS — N401 Enlarged prostate with lower urinary tract symptoms: Secondary | ICD-10-CM | POA: Diagnosis not present

## 2016-10-14 DIAGNOSIS — R351 Nocturia: Secondary | ICD-10-CM | POA: Diagnosis not present

## 2016-11-26 ENCOUNTER — Other Ambulatory Visit: Payer: Self-pay | Admitting: Neurology

## 2016-11-26 DIAGNOSIS — R51 Headache: Principal | ICD-10-CM

## 2016-11-26 DIAGNOSIS — R519 Headache, unspecified: Secondary | ICD-10-CM

## 2016-11-26 DIAGNOSIS — G4731 Primary central sleep apnea: Secondary | ICD-10-CM

## 2016-11-26 DIAGNOSIS — G4733 Obstructive sleep apnea (adult) (pediatric): Secondary | ICD-10-CM

## 2016-11-26 DIAGNOSIS — E669 Obesity, unspecified: Secondary | ICD-10-CM

## 2016-11-26 DIAGNOSIS — R42 Dizziness and giddiness: Secondary | ICD-10-CM

## 2016-12-12 IMAGING — CT CT ORBITS W/O CM
3 series · 14 of 47 positions shown, 16 images · non-contrast
Comparison: None.

CLINICAL DATA: Ocular pain and pressure with blurry vision

EXAM:
CT ORBITS WITHOUT CONTRAST
TECHNIQUE: Multidetector CT imaging of the orbits was performed following the
standard protocol without intravenous contrast.

[Series 2: facial/ orbits 2.0 h30s · axial · 0.33mm/px · z∈[+1446,+1520]mm · 8 of 43 slices shown, 10 images]
[im 3/43  brain]
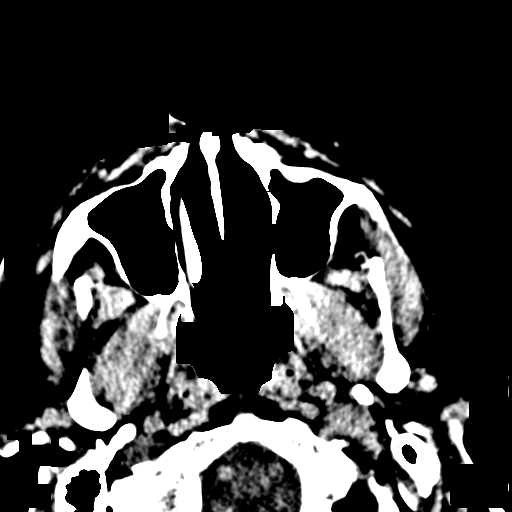
[im 3/43  bone]
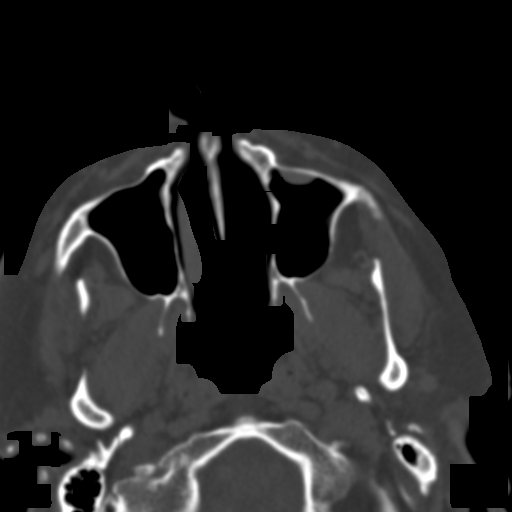
[im 9/43  bone]
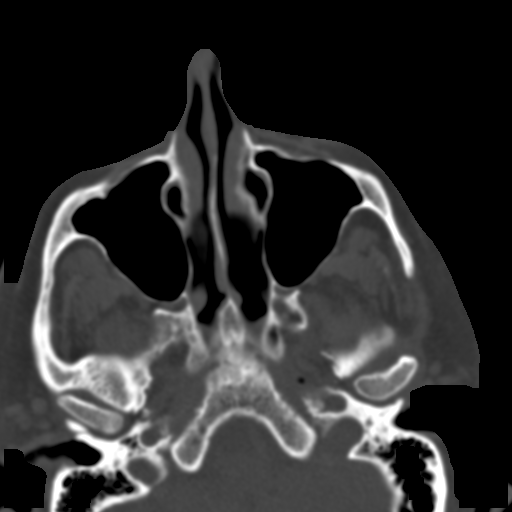
[im 14/43  bone]
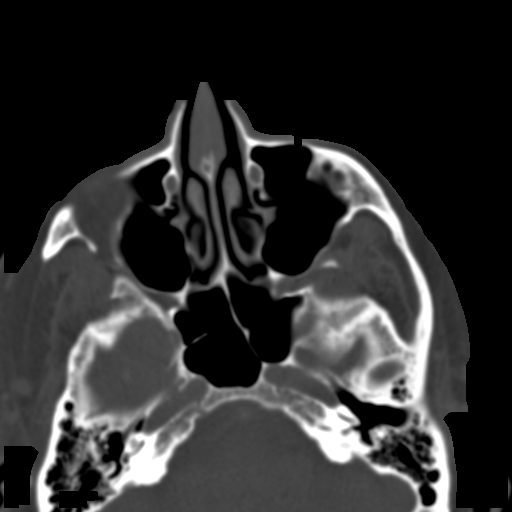
[im 19/43  bone]
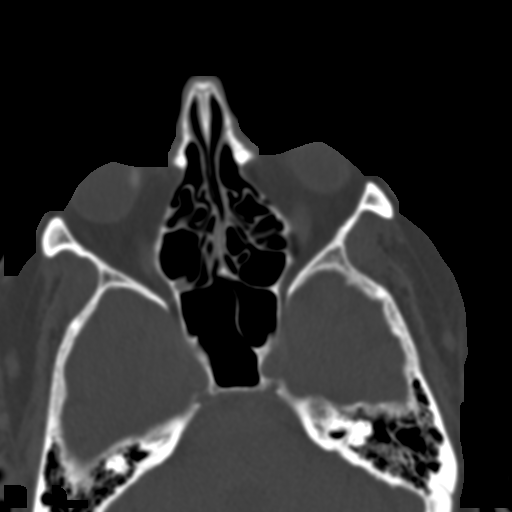
[im 24/43  brain]
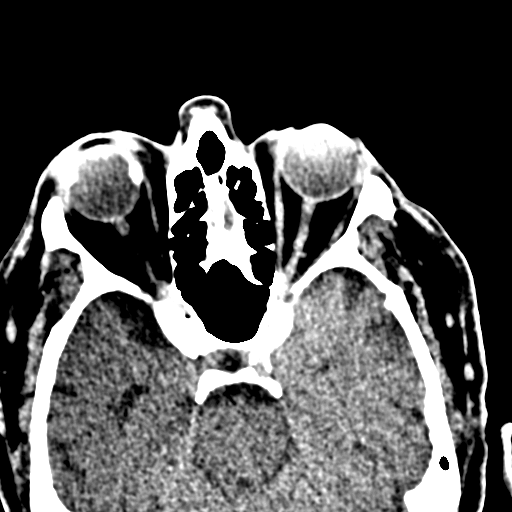
[im 24/43  bone]
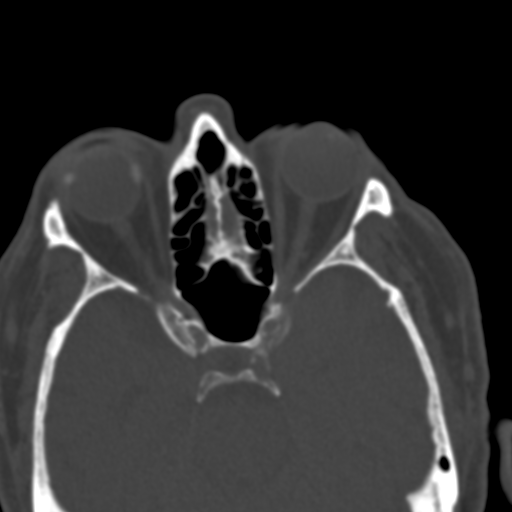
[im 29/43  bone]
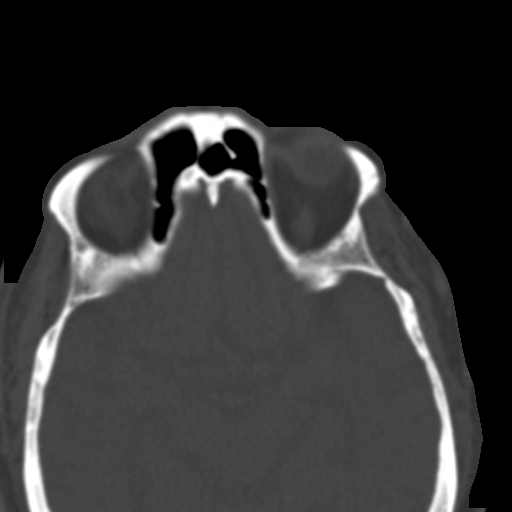
[im 34/43  bone]
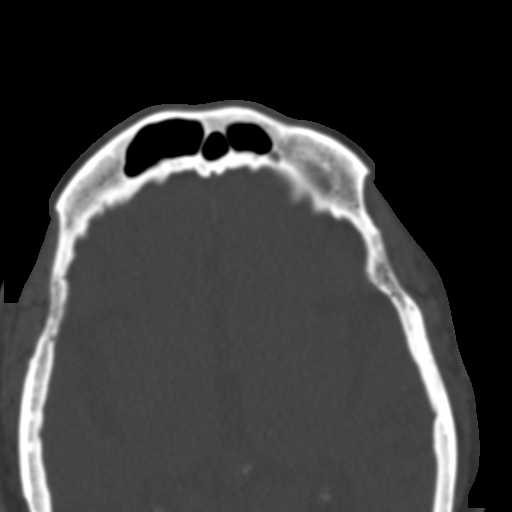
[im 40/43  bone]
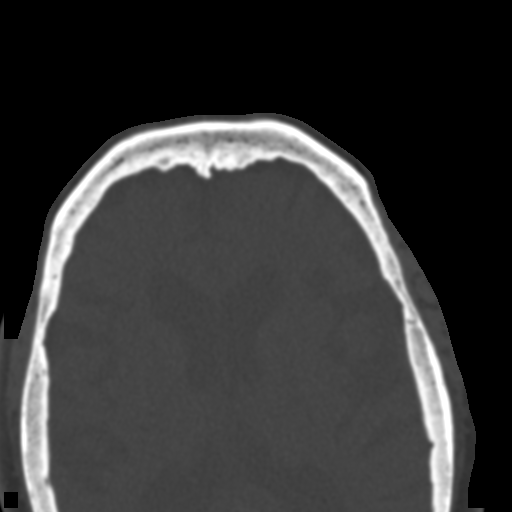

[Series 6: coronal soft tissue · coronal · 0.20mm/px · 3 of 64 slices shown]
[im 22/64  bone]
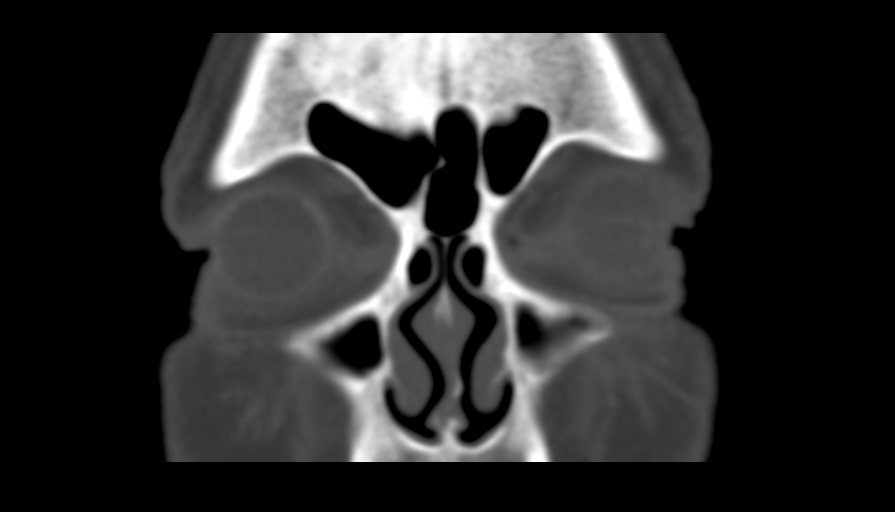
[im 29/64  bone]
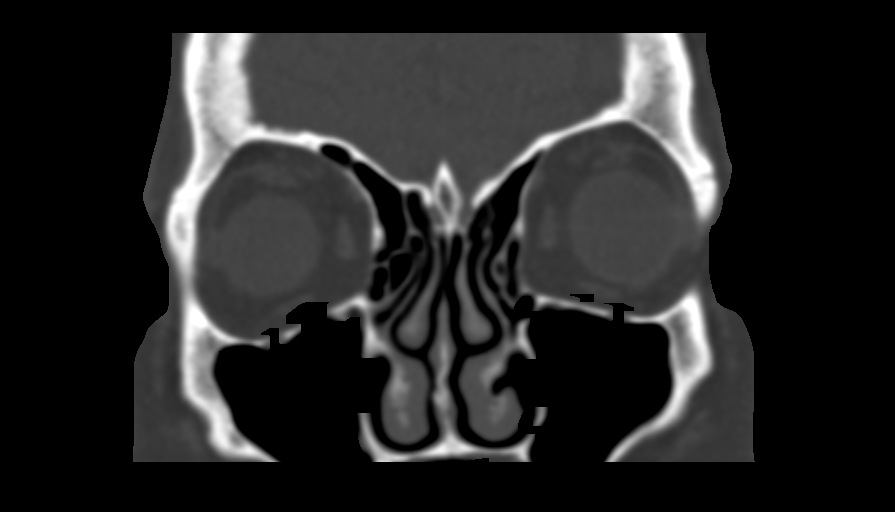
[im 36/64  bone]
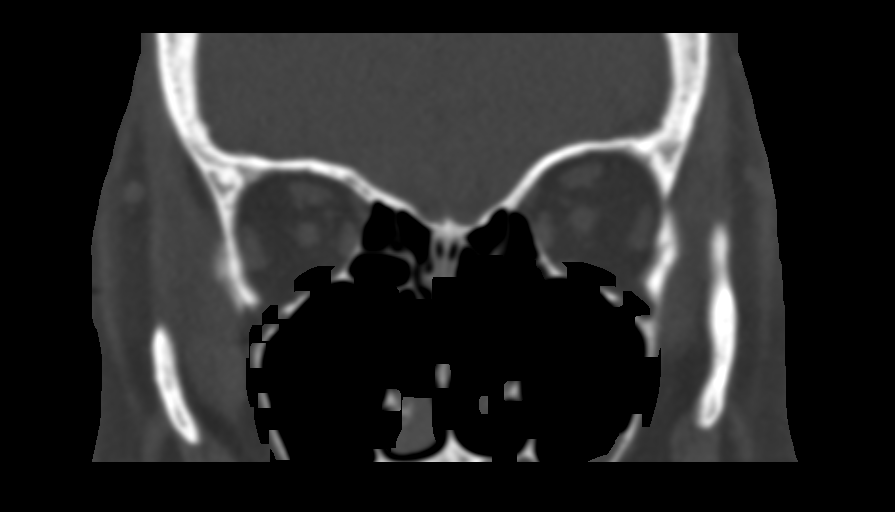

[Series 8: sagittal soft tissue · sagittal · 0.21mm/px · 3 of 76 slices shown]
[im 26/76  bone]
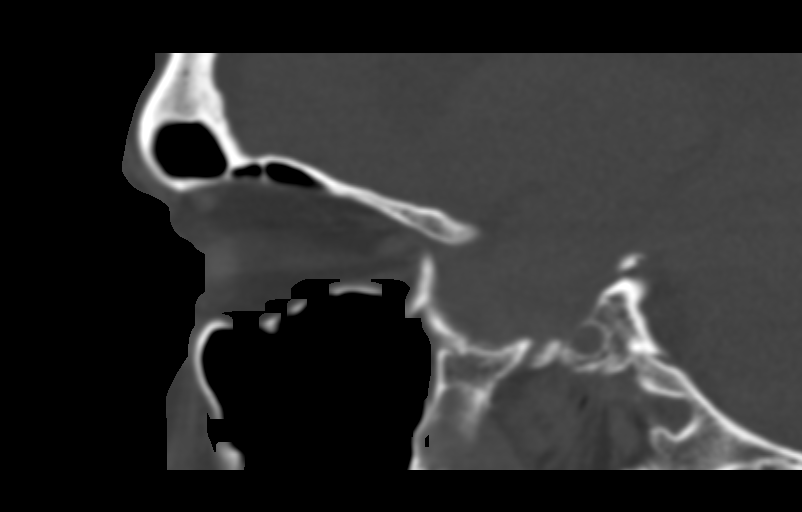
[im 38/76  bone]
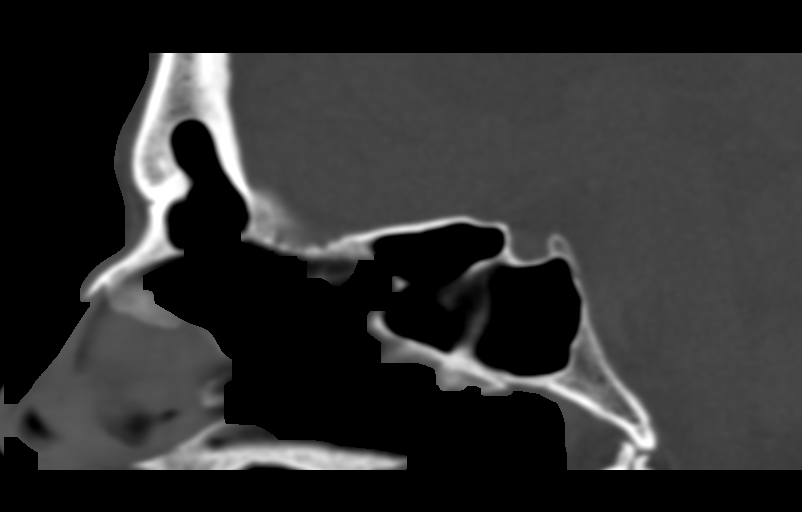
[im 51/76  bone]
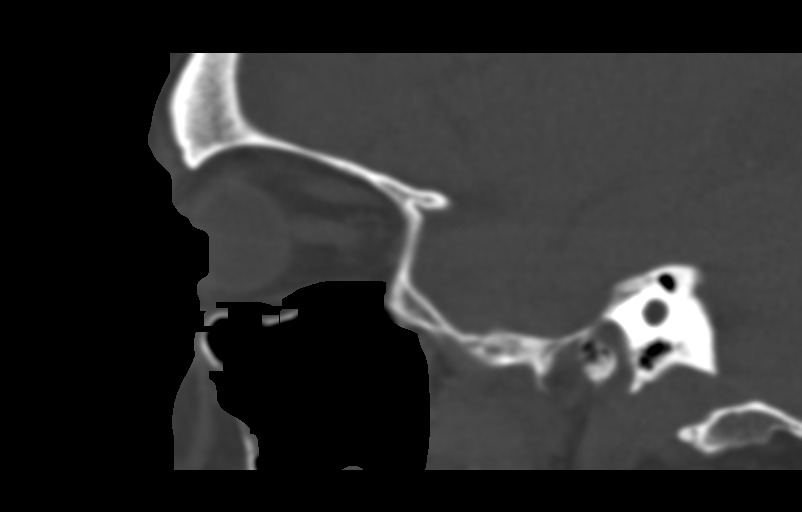

[14 of 47 positions shown; findings below may reference images not displayed]

FINDINGS: Bony structures are within normal limits. The paranasal sinuses
demonstrate evidence of mucosal retention cysts within the left
maxillary antrum. Postoperative changes are noted within the right
eye consistent with the history of detached retina and repair. The
orbits are otherwise within normal limits. Atrophic changes are
noted intracranially. No definitive sellar mass is seen.
IMPRESSION: Mild mucosal disease within the left maxillary antrum.

Post operative changes in the right globe.

No other focal abnormality is seen to correspond with the patient's
given clinical symptomatology.

## 2016-12-29 ENCOUNTER — Ambulatory Visit: Payer: Medicare Other | Admitting: Neurology

## 2017-01-04 DIAGNOSIS — N401 Enlarged prostate with lower urinary tract symptoms: Secondary | ICD-10-CM | POA: Diagnosis not present

## 2017-01-05 DIAGNOSIS — I7389 Other specified peripheral vascular diseases: Secondary | ICD-10-CM | POA: Diagnosis not present

## 2017-01-05 DIAGNOSIS — Z1389 Encounter for screening for other disorder: Secondary | ICD-10-CM | POA: Diagnosis not present

## 2017-01-05 DIAGNOSIS — E784 Other hyperlipidemia: Secondary | ICD-10-CM | POA: Diagnosis not present

## 2017-01-05 DIAGNOSIS — G4733 Obstructive sleep apnea (adult) (pediatric): Secondary | ICD-10-CM | POA: Diagnosis not present

## 2017-01-05 DIAGNOSIS — Z6835 Body mass index (BMI) 35.0-35.9, adult: Secondary | ICD-10-CM | POA: Diagnosis not present

## 2017-01-05 DIAGNOSIS — I10 Essential (primary) hypertension: Secondary | ICD-10-CM | POA: Diagnosis not present

## 2017-01-05 DIAGNOSIS — N400 Enlarged prostate without lower urinary tract symptoms: Secondary | ICD-10-CM | POA: Diagnosis not present

## 2017-01-05 DIAGNOSIS — E1129 Type 2 diabetes mellitus with other diabetic kidney complication: Secondary | ICD-10-CM | POA: Diagnosis not present

## 2017-01-05 DIAGNOSIS — I2510 Atherosclerotic heart disease of native coronary artery without angina pectoris: Secondary | ICD-10-CM | POA: Diagnosis not present

## 2017-01-24 DIAGNOSIS — M9903 Segmental and somatic dysfunction of lumbar region: Secondary | ICD-10-CM | POA: Diagnosis not present

## 2017-01-24 DIAGNOSIS — M9905 Segmental and somatic dysfunction of pelvic region: Secondary | ICD-10-CM | POA: Diagnosis not present

## 2017-01-24 DIAGNOSIS — Q72812 Congenital shortening of left lower limb: Secondary | ICD-10-CM | POA: Diagnosis not present

## 2017-01-24 DIAGNOSIS — M5137 Other intervertebral disc degeneration, lumbosacral region: Secondary | ICD-10-CM | POA: Diagnosis not present

## 2017-01-24 DIAGNOSIS — M9904 Segmental and somatic dysfunction of sacral region: Secondary | ICD-10-CM | POA: Diagnosis not present

## 2017-01-25 DIAGNOSIS — M9905 Segmental and somatic dysfunction of pelvic region: Secondary | ICD-10-CM | POA: Diagnosis not present

## 2017-01-25 DIAGNOSIS — M9904 Segmental and somatic dysfunction of sacral region: Secondary | ICD-10-CM | POA: Diagnosis not present

## 2017-01-25 DIAGNOSIS — Q72812 Congenital shortening of left lower limb: Secondary | ICD-10-CM | POA: Diagnosis not present

## 2017-01-25 DIAGNOSIS — M5137 Other intervertebral disc degeneration, lumbosacral region: Secondary | ICD-10-CM | POA: Diagnosis not present

## 2017-01-25 DIAGNOSIS — M9903 Segmental and somatic dysfunction of lumbar region: Secondary | ICD-10-CM | POA: Diagnosis not present

## 2017-03-14 ENCOUNTER — Ambulatory Visit: Payer: Medicare Other | Admitting: Neurology

## 2017-04-12 DIAGNOSIS — N401 Enlarged prostate with lower urinary tract symptoms: Secondary | ICD-10-CM | POA: Diagnosis not present

## 2017-04-12 DIAGNOSIS — R310 Gross hematuria: Secondary | ICD-10-CM | POA: Diagnosis not present

## 2017-04-12 DIAGNOSIS — N3941 Urge incontinence: Secondary | ICD-10-CM | POA: Diagnosis not present

## 2017-04-12 DIAGNOSIS — R351 Nocturia: Secondary | ICD-10-CM | POA: Diagnosis not present

## 2017-04-12 DIAGNOSIS — N200 Calculus of kidney: Secondary | ICD-10-CM | POA: Diagnosis not present

## 2017-05-09 DIAGNOSIS — E784 Other hyperlipidemia: Secondary | ICD-10-CM | POA: Diagnosis not present

## 2017-05-09 DIAGNOSIS — Z23 Encounter for immunization: Secondary | ICD-10-CM | POA: Diagnosis not present

## 2017-05-09 DIAGNOSIS — Z6836 Body mass index (BMI) 36.0-36.9, adult: Secondary | ICD-10-CM | POA: Diagnosis not present

## 2017-05-09 DIAGNOSIS — I2510 Atherosclerotic heart disease of native coronary artery without angina pectoris: Secondary | ICD-10-CM | POA: Diagnosis not present

## 2017-05-09 DIAGNOSIS — I10 Essential (primary) hypertension: Secondary | ICD-10-CM | POA: Diagnosis not present

## 2017-05-09 DIAGNOSIS — I7389 Other specified peripheral vascular diseases: Secondary | ICD-10-CM | POA: Diagnosis not present

## 2017-05-09 DIAGNOSIS — G4733 Obstructive sleep apnea (adult) (pediatric): Secondary | ICD-10-CM | POA: Diagnosis not present

## 2017-05-09 DIAGNOSIS — E1129 Type 2 diabetes mellitus with other diabetic kidney complication: Secondary | ICD-10-CM | POA: Diagnosis not present

## 2017-06-26 DIAGNOSIS — Z85828 Personal history of other malignant neoplasm of skin: Secondary | ICD-10-CM | POA: Diagnosis not present

## 2017-06-26 DIAGNOSIS — L400 Psoriasis vulgaris: Secondary | ICD-10-CM | POA: Diagnosis not present

## 2017-07-28 DIAGNOSIS — I2510 Atherosclerotic heart disease of native coronary artery without angina pectoris: Secondary | ICD-10-CM | POA: Diagnosis not present

## 2017-07-28 DIAGNOSIS — Z6837 Body mass index (BMI) 37.0-37.9, adult: Secondary | ICD-10-CM | POA: Diagnosis not present

## 2017-07-28 DIAGNOSIS — G4733 Obstructive sleep apnea (adult) (pediatric): Secondary | ICD-10-CM | POA: Diagnosis not present

## 2017-07-28 DIAGNOSIS — I10 Essential (primary) hypertension: Secondary | ICD-10-CM | POA: Diagnosis not present

## 2017-07-28 DIAGNOSIS — F4321 Adjustment disorder with depressed mood: Secondary | ICD-10-CM | POA: Diagnosis not present

## 2017-07-28 DIAGNOSIS — E1129 Type 2 diabetes mellitus with other diabetic kidney complication: Secondary | ICD-10-CM | POA: Diagnosis not present

## 2017-08-23 DIAGNOSIS — R81 Glycosuria: Secondary | ICD-10-CM | POA: Diagnosis not present

## 2017-08-23 DIAGNOSIS — R351 Nocturia: Secondary | ICD-10-CM | POA: Diagnosis not present

## 2017-08-23 DIAGNOSIS — R350 Frequency of micturition: Secondary | ICD-10-CM | POA: Diagnosis not present

## 2017-08-31 DIAGNOSIS — H33302 Unspecified retinal break, left eye: Secondary | ICD-10-CM | POA: Diagnosis not present

## 2017-08-31 DIAGNOSIS — H43391 Other vitreous opacities, right eye: Secondary | ICD-10-CM | POA: Diagnosis not present

## 2017-08-31 DIAGNOSIS — H35411 Lattice degeneration of retina, right eye: Secondary | ICD-10-CM | POA: Diagnosis not present

## 2017-08-31 DIAGNOSIS — H35371 Puckering of macula, right eye: Secondary | ICD-10-CM | POA: Diagnosis not present

## 2017-09-25 DIAGNOSIS — G4733 Obstructive sleep apnea (adult) (pediatric): Secondary | ICD-10-CM | POA: Diagnosis not present

## 2017-09-25 DIAGNOSIS — I2510 Atherosclerotic heart disease of native coronary artery without angina pectoris: Secondary | ICD-10-CM | POA: Diagnosis not present

## 2017-09-25 DIAGNOSIS — Z6836 Body mass index (BMI) 36.0-36.9, adult: Secondary | ICD-10-CM | POA: Diagnosis not present

## 2017-09-25 DIAGNOSIS — E1129 Type 2 diabetes mellitus with other diabetic kidney complication: Secondary | ICD-10-CM | POA: Diagnosis not present

## 2017-09-25 DIAGNOSIS — F4321 Adjustment disorder with depressed mood: Secondary | ICD-10-CM | POA: Diagnosis not present

## 2017-09-25 DIAGNOSIS — I10 Essential (primary) hypertension: Secondary | ICD-10-CM | POA: Diagnosis not present

## 2017-09-25 DIAGNOSIS — Z1389 Encounter for screening for other disorder: Secondary | ICD-10-CM | POA: Diagnosis not present

## 2017-09-25 DIAGNOSIS — I7389 Other specified peripheral vascular diseases: Secondary | ICD-10-CM | POA: Diagnosis not present

## 2017-11-29 DIAGNOSIS — N401 Enlarged prostate with lower urinary tract symptoms: Secondary | ICD-10-CM | POA: Diagnosis not present

## 2017-11-29 DIAGNOSIS — R3121 Asymptomatic microscopic hematuria: Secondary | ICD-10-CM | POA: Diagnosis not present

## 2017-11-29 DIAGNOSIS — N3941 Urge incontinence: Secondary | ICD-10-CM | POA: Diagnosis not present

## 2017-11-29 DIAGNOSIS — N200 Calculus of kidney: Secondary | ICD-10-CM | POA: Diagnosis not present

## 2017-11-29 DIAGNOSIS — R351 Nocturia: Secondary | ICD-10-CM | POA: Diagnosis not present

## 2017-12-28 DIAGNOSIS — M1712 Unilateral primary osteoarthritis, left knee: Secondary | ICD-10-CM | POA: Diagnosis not present

## 2018-01-16 DIAGNOSIS — G4733 Obstructive sleep apnea (adult) (pediatric): Secondary | ICD-10-CM | POA: Diagnosis not present

## 2018-01-16 DIAGNOSIS — E7849 Other hyperlipidemia: Secondary | ICD-10-CM | POA: Diagnosis not present

## 2018-01-16 DIAGNOSIS — F4321 Adjustment disorder with depressed mood: Secondary | ICD-10-CM | POA: Diagnosis not present

## 2018-01-16 DIAGNOSIS — I10 Essential (primary) hypertension: Secondary | ICD-10-CM | POA: Diagnosis not present

## 2018-01-16 DIAGNOSIS — E1121 Type 2 diabetes mellitus with diabetic nephropathy: Secondary | ICD-10-CM | POA: Diagnosis not present

## 2018-01-16 DIAGNOSIS — E1129 Type 2 diabetes mellitus with other diabetic kidney complication: Secondary | ICD-10-CM | POA: Diagnosis not present

## 2018-01-16 DIAGNOSIS — I2510 Atherosclerotic heart disease of native coronary artery without angina pectoris: Secondary | ICD-10-CM | POA: Diagnosis not present

## 2018-01-16 DIAGNOSIS — I7389 Other specified peripheral vascular diseases: Secondary | ICD-10-CM | POA: Diagnosis not present

## 2018-01-16 DIAGNOSIS — Z6833 Body mass index (BMI) 33.0-33.9, adult: Secondary | ICD-10-CM | POA: Diagnosis not present

## 2018-01-31 DIAGNOSIS — E1129 Type 2 diabetes mellitus with other diabetic kidney complication: Secondary | ICD-10-CM | POA: Diagnosis not present

## 2018-02-12 DIAGNOSIS — Z79899 Other long term (current) drug therapy: Secondary | ICD-10-CM | POA: Diagnosis not present

## 2018-02-13 ENCOUNTER — Ambulatory Visit (INDEPENDENT_AMBULATORY_CARE_PROVIDER_SITE_OTHER): Payer: Medicare Other | Admitting: Podiatry

## 2018-02-13 ENCOUNTER — Other Ambulatory Visit: Payer: Self-pay

## 2018-02-13 ENCOUNTER — Encounter: Payer: Self-pay | Admitting: Podiatry

## 2018-02-13 VITALS — BP 100/56 | HR 51

## 2018-02-13 DIAGNOSIS — L603 Nail dystrophy: Secondary | ICD-10-CM | POA: Diagnosis not present

## 2018-02-13 DIAGNOSIS — B351 Tinea unguium: Secondary | ICD-10-CM | POA: Diagnosis not present

## 2018-02-13 DIAGNOSIS — F329 Major depressive disorder, single episode, unspecified: Secondary | ICD-10-CM | POA: Insufficient documentation

## 2018-02-13 DIAGNOSIS — F32A Depression, unspecified: Secondary | ICD-10-CM | POA: Insufficient documentation

## 2018-02-13 NOTE — Progress Notes (Signed)
Subjective:  Patient ID: Keith [name]., male    DOB: June 19, 1946,  MRN: [id number] HPI Chief Complaint  Patient presents with  . Nail Problem    right foot great toe; pt stated, "think its fungus; been about 79mo, now starting ot hurt when i wear shoes; tender to touch at times, little discoloration"    72 y.o. male presents with the above complaint.   ROS: Denies fever chills nausea vomiting muscle aches pains calf pain back pain chest pain shortness of breath.  Past Medical History:  Diagnosis Date  . Anginal pain ([place])   . Anxiety    takes Celexa  . Arthritis    bil knees  . Benign prostatic hypertrophy    takes Flomax daily  . Bladder stones 06/25/2013   1.9cm bladder stone.  Marland Kitchen BPH with urinary obstruction 06/25/2013   127cc prostate with large middle lobe and bladder stones.   . Complication of anesthesia    hard to wake up  . Diabetes mellitus without complication ([place])   . ED (erectile dysfunction)   . Hyperlipidemia   . Hypertension   . PONV (postoperative nausea and vomiting)   . Sleep apnea    uses bipap machine  . Spinal headache    Past Surgical History:  Procedure Laterality Date  . CARDIAC CATHETERIZATION  06-15-14  . CATARACT EXTRACTION W/ INTRAOCULAR LENS  IMPLANT, BILATERAL  03/13/2003  . COLONOSCOPY    . CYSTOSCOPY WITH LITHOLAPAXY N/A 06/25/2013   Procedure: CYSTOSCOPY WITH LITHOLAPAXY;  Surgeon: [name], MD;  Location: [facility];  Service: Urology;  Laterality: N/A;  . KNEE ARTHROSCOPY  09/07/2010   left knee  . KNEE ARTHROSCOPY  05/27/2000   left knee  . LEFT HEART CATHETERIZATION WITH CORONARY ANGIOGRAM N/A 06/17/2014   Procedure: LEFT HEART CATHETERIZATION WITH CORONARY ANGIOGRAM;  Surgeon: Laverda Page, MD;  Location: [facility] CATH LAB;  Service: Cardiovascular;  Laterality: N/A;  . RETINAL DETACHMENT REPAIR W/ SCLERAL BUCKLE LE  10/14/2002   Dr Zadie Rhine  . TONSILLECTOMY     age 87  . TOTAL KNEE ARTHROPLASTY  08/01/2011   Procedure: TOTAL  KNEE ARTHROPLASTY;  Surgeon: Lorn Junes, MD;  Location: [place];  Service: Orthopedics;  Laterality: Right;  TOTAL KNEE ARTHROPLASTY  RIGHT SIDE  . TRANSURETHRAL RESECTION OF PROSTATE N/A 06/25/2013   Procedure: TRANSURETHRAL RESECTION OF THE PROSTATE WITH GYRUS INSTRUMENTS;  Surgeon: [name], MD;  Location: [facility];  Service: Urology;  Laterality: N/A;  . VASECTOMY  1982    Current Outpatient Medications:  .  aspirin EC 81 MG tablet, Take 81 mg by mouth daily., Disp: , Rfl:  .  atorvastatin (LIPITOR) 20 MG tablet, Take 20 mg by mouth daily., Disp: , Rfl:  .  buPROPion (WELLBUTRIN SR) 150 MG 12 hr tablet, bupropion HCl SR 150 mg tablet,12 hr sustained-release, Disp: , Rfl:  .  celecoxib (CELEBREX) 200 MG capsule, Take by mouth., Disp: , Rfl:  .  Cholecalciferol (VITAMIN D) 2000 UNITS tablet, Take 2,000 Units by mouth daily., Disp: , Rfl:  .  citalopram (CELEXA) 40 MG tablet, Take 40 mg by mouth daily., Disp: , Rfl:  .  clopidogrel (PLAVIX) 75 MG tablet, Take 75 mg by mouth daily., Disp: , Rfl:  .  escitalopram (LEXAPRO) 20 MG tablet, , Disp: , Rfl:  .  FARXIGA 10 MG TABS tablet, Take 10 mg by mouth daily. , Disp: , Rfl:  .  finasteride (PROSCAR) 5 MG tablet, , Disp: ,  Rfl:  .  lisinopril (PRINIVIL,ZESTRIL) 10 MG tablet, Take 10 mg by mouth daily., Disp: , Rfl:  .  metFORMIN (GLUCOPHAGE) 1000 MG tablet, Take 1,000 mg by mouth 2 (two) times daily with a meal., Disp: , Rfl:  .  metoprolol tartrate (LOPRESSOR) 25 MG tablet, Take 25 mg by mouth 2 (two) times daily., Disp: , Rfl:  .  mirabegron ER (MYRBETRIQ) 25 MG TB24 tablet, Myrbetriq 25 mg tablet,extended release, Disp: , Rfl:  .  NITROSTAT 0.4 MG SL tablet, Place 0.4 mg under the tongue every 5 (five) minutes as needed for chest pain. , Disp: , Rfl:  .  senna-docusate (SENOKOT-S) 8.6-50 MG per tablet, Take 1 tablet by mouth daily as needed for mild constipation., Disp: , Rfl:  .  topiramate (TOPAMAX) 25 MG tablet, Take 2 tablets (50 mg  total) by mouth 2 (two) times daily., Disp: 120 tablet, Rfl: 0 .  meloxicam (MOBIC) 15 MG tablet, Take 15 mg by mouth daily., Disp: , Rfl:    Allergies: Penicillin  Review of Systems Objective:   Vitals:   02/13/18 1028  BP: (!) 100/56  Pulse: (!) 51    General: Well developed, nourished, in no acute distress, alert and oriented x3   Dermatological: Skin is warm, dry and supple bilateral. Nails x 10 are well maintained; remaining integument appears unremarkable at this time. There are no open sores, no preulcerative lesions, no rash or signs of infection present.  Thick dystrophic possibly mycotic nail hallux right.  Vascular: Dorsalis Pedis artery and Posterior Tibial artery pedal pulses are 2/4 bilateral with immedate capillary fill time. Pedal hair growth present. No varicosities and no lower extremity edema present bilateral.   Neruologic: Grossly intact via light touch bilateral. Vibratory intact via tuning fork bilateral. Protective threshold with Semmes Wienstein monofilament intact to all pedal sites bilateral. Patellar and Achilles deep tendon reflexes 2+ bilateral. No Babinski or clonus noted bilateral.   Musculoskeletal: No gross boney pedal deformities bilateral. No pain, crepitus, or limitation noted with foot and ankle range of motion bilateral. Muscular strength 5/5 in all groups tested bilateral.  Gait: Unassisted, Nonantalgic.    Radiographs:  None taken  Assessment & Plan:   Assessment: Cannot rule out onychomycosis.   Plan: Samples of skin and nail were taken today and sent for pathologic evaluation.     Lannis Lichtenwalner T. Ewing, [place]

## 2018-03-13 ENCOUNTER — Encounter: Payer: Self-pay | Admitting: Podiatry

## 2018-03-13 ENCOUNTER — Ambulatory Visit (INDEPENDENT_AMBULATORY_CARE_PROVIDER_SITE_OTHER): Payer: Medicare Other | Admitting: Podiatry

## 2018-03-13 DIAGNOSIS — L603 Nail dystrophy: Secondary | ICD-10-CM

## 2018-03-13 DIAGNOSIS — Z79899 Other long term (current) drug therapy: Secondary | ICD-10-CM

## 2018-03-13 LAB — HEPATIC FUNCTION PANEL
AG Ratio: 1.8 (calc) (ref 1.0–2.5)
ALT: 16 U/L (ref 9–46)
AST: 13 U/L (ref 10–35)
Albumin: 4.4 g/dL (ref 3.6–5.1)
Alkaline phosphatase (APISO): 59 U/L (ref 40–115)
Bilirubin, Direct: 0.1 mg/dL (ref 0.0–0.2)
Globulin: 2.4 g/dL (calc) (ref 1.9–3.7)
Indirect Bilirubin: 0.4 mg/dL (calc) (ref 0.2–1.2)
Total Bilirubin: 0.5 mg/dL (ref 0.2–1.2)
Total Protein: 6.8 g/dL (ref 6.1–8.1)

## 2018-03-13 MED ORDER — TERBINAFINE HCL 250 MG PO TABS: 250.0000 mg | ORAL_TABLET | Freq: Every day | ORAL | 0 refills | Status: DC

## 2018-03-13 NOTE — Patient Instructions (Signed)

## 2018-03-13 NOTE — Progress Notes (Signed)
He presents today for follow-up of his pathology results regarding his toenails.  Objective: Vital signs are stable he is alert and oriented x3.  Pathology results do demonstrate onychomycosis.  Assessment: Onychomycosis.  Plan: Discussed etiology pathology conservative versus surgical therapies at this point we decided oral therapy would be best for him.  We did discuss the pros and cons of antifungal therapy he understands this and is amenable to it.  He understands that we will need a blood draw at least twice during this treatment..  I will follow-up with him in 1 month.  We did request a liver profile today should this come back abnormal I will notify him immediately.  Otherwise he will go ahead and start his Lamisil 250 mg tablets 1 p.o. daily.

## 2018-03-19 ENCOUNTER — Telehealth: Payer: Self-pay | Admitting: *Deleted

## 2018-03-19 NOTE — Telephone Encounter (Signed)
-----   Message from Garrel Ridgel, [place] sent at 03/14/2018  6:42 AM EDT ----- Blood work looks perfect and may continue medication.

## 2018-03-19 NOTE — Telephone Encounter (Signed)
I informed pt of Dr. Hyatt's review of results and orders. 

## 2018-04-06 ENCOUNTER — Other Ambulatory Visit: Payer: Self-pay | Admitting: Podiatry

## 2018-04-12 DIAGNOSIS — M1712 Unilateral primary osteoarthritis, left knee: Secondary | ICD-10-CM | POA: Diagnosis not present

## 2018-04-17 ENCOUNTER — Encounter: Payer: Self-pay | Admitting: Podiatry

## 2018-04-17 ENCOUNTER — Ambulatory Visit (INDEPENDENT_AMBULATORY_CARE_PROVIDER_SITE_OTHER): Payer: Medicare Other | Admitting: Podiatry

## 2018-04-17 DIAGNOSIS — Z79899 Other long term (current) drug therapy: Secondary | ICD-10-CM

## 2018-04-17 DIAGNOSIS — L603 Nail dystrophy: Secondary | ICD-10-CM

## 2018-04-17 MED ORDER — TERBINAFINE HCL 250 MG PO TABS: 250.0000 mg | ORAL_TABLET | Freq: Every day | ORAL | 0 refills | Status: DC

## 2018-04-18 ENCOUNTER — Telehealth: Payer: Self-pay | Admitting: *Deleted

## 2018-04-18 LAB — HEPATIC FUNCTION PANEL
AG Ratio: 1.6 (calc) (ref 1.0–2.5)
ALT: 10 U/L (ref 9–46)
AST: 12 U/L (ref 10–35)
Albumin: 4.2 g/dL (ref 3.6–5.1)
Alkaline phosphatase (APISO): 56 U/L (ref 40–115)
Bilirubin, Direct: 0.1 mg/dL (ref 0.0–0.2)
Globulin: 2.6 g/dL (calc) (ref 1.9–3.7)
Indirect Bilirubin: 0.6 mg/dL (calc) (ref 0.2–1.2)
Total Bilirubin: 0.7 mg/dL (ref 0.2–1.2)
Total Protein: 6.8 g/dL (ref 6.1–8.1)

## 2018-04-18 NOTE — Progress Notes (Signed)
He presents today for follow-up of his nail fungus has now completed 30 days of Lamisil with no ill effects.  He denies fever chills nausea vomiting muscle aches pains calf pain back pain chest itching or rashes.  Objective: Vital signs are stable he is alert and oriented x3 no change in his past medical history medications allergies toenails are unchanged at this point.  Assessment: Onychomycosis long-term treatment with Lamisil.  Plan: Another 90 days of Lamisil was prescribed along with a requisition for liver profile.  Should this come back abnormal we will notify him immediately.  Otherwise I will follow-up with him in 4 months

## 2018-04-18 NOTE — Telephone Encounter (Signed)
I informed pt of Dr. Hyatt's review of results and orders. 

## 2018-04-18 NOTE — Telephone Encounter (Signed)
-----   Message from Garrel Ridgel, [place] sent at 04/18/2018 11:57 AM EDT ----- Blood work looks perfect continue medication.

## 2018-04-23 DIAGNOSIS — N390 Urinary tract infection, site not specified: Secondary | ICD-10-CM | POA: Diagnosis not present

## 2018-04-23 DIAGNOSIS — I129 Hypertensive chronic kidney disease with stage 1 through stage 4 chronic kidney disease, or unspecified chronic kidney disease: Secondary | ICD-10-CM | POA: Diagnosis not present

## 2018-04-23 DIAGNOSIS — N183 Chronic kidney disease, stage 3 (moderate): Secondary | ICD-10-CM | POA: Diagnosis not present

## 2018-04-23 DIAGNOSIS — E1122 Type 2 diabetes mellitus with diabetic chronic kidney disease: Secondary | ICD-10-CM | POA: Diagnosis not present

## 2018-04-23 DIAGNOSIS — N200 Calculus of kidney: Secondary | ICD-10-CM | POA: Diagnosis not present

## 2018-04-23 DIAGNOSIS — N179 Acute kidney failure, unspecified: Secondary | ICD-10-CM | POA: Diagnosis not present

## 2018-04-25 ENCOUNTER — Other Ambulatory Visit: Payer: Self-pay | Admitting: Orthopedic Surgery

## 2018-04-25 ENCOUNTER — Other Ambulatory Visit: Payer: Self-pay | Admitting: Internal Medicine

## 2018-04-25 DIAGNOSIS — N183 Chronic kidney disease, stage 3 unspecified: Secondary | ICD-10-CM

## 2018-04-25 NOTE — H&P (Signed)
TOTAL KNEE ADMISSION H&P  Patient is being admitted for left total knee arthroplasty.  Subjective:  Chief Complaint:   Left knee primary OA / pain  HPI: Keith Drain., 72 y.o. male, has a history of pain and functional disability in the left knee due to arthritis and has failed non-surgical conservative treatments for greater than 12 weeks to include NSAID's and/or analgesics, corticosteriod injections and activity modification.  Onset of symptoms was gradual, starting 4 years ago with gradually worsening course since that time. The patient noted prior procedures on the knee to include  arthroscopy and menisectomy on the left knee(s).  Patient currently rates pain in the left knee(s) at 6 out of 10 with activity. Patient has worsening of pain with activity and weight bearing, pain that interferes with activities of daily living, pain with passive range of motion, crepitus and joint swelling.  Patient has evidence of periarticular osteophytes and joint space narrowing by imaging studies.  There is no active infection.  Risks, benefits and expectations were discussed with the patient.  Risks including but not limited to the risk of anesthesia, blood clots, nerve damage, blood vessel damage, failure of the prosthesis, infection and up to and including death.  Patient understand the risks, benefits and expectations and wishes to proceed with surgery.   PCP: Keith Battles, MD  D/C Plans:       Home   Post-op Meds:       No Rx given   Tranexamic Acid:      To be given - IV   Decadron:      Is to be given  FYI:     Plavix & ASA (on pre-op)  Norco  DME:   Pt already has equipment   PT:   OPPT Rx given   Patient Active Problem List   Diagnosis Date Noted  . Depression 02/13/2018  . BPH with urinary obstruction 06/25/2013  . Bladder stones 06/25/2013  . Diabetes type 2, uncontrolled ([place]) 08/14/2012  . History of total knee replacement, right 08/01/2011  . DJD (degenerative joint  disease) of knee 07/20/2011  . Hyperlipidemia 07/11/2011  . RHINITIS 01/28/2010  . MIGRAINE, CHRONIC 11/28/2008  . ERECTILE DYSFUNCTION, MILD 05/28/2008  . BENIGN PROSTATIC HYPERTROPHY 03/28/2008   Past Medical History:  Diagnosis Date  . Anginal pain ([place])   . Anxiety    takes Celexa  . Arthritis    bil knees  . Benign prostatic hypertrophy    takes Flomax daily  . Bladder stones 06/25/2013   1.9cm bladder stone.  Marland Kitchen BPH with urinary obstruction 06/25/2013   127cc prostate with large middle lobe and bladder stones.   . Complication of anesthesia    hard to wake up  . Diabetes mellitus without complication ([place])   . ED (erectile dysfunction)   . Hyperlipidemia   . Hypertension   . PONV (postoperative nausea and vomiting)   . Sleep apnea    uses bipap machine  . Spinal headache     Past Surgical History:  Procedure Laterality Date  . CARDIAC CATHETERIZATION  06-15-14  . CATARACT EXTRACTION W/ INTRAOCULAR LENS  IMPLANT, BILATERAL  03/13/2003  . COLONOSCOPY    . CYSTOSCOPY WITH LITHOLAPAXY N/A 06/25/2013   Procedure: CYSTOSCOPY WITH LITHOLAPAXY;  Surgeon: [name], MD;  Location: [facility];  Service: Urology;  Laterality: N/A;  . KNEE ARTHROSCOPY  09/07/2010   left knee  . KNEE ARTHROSCOPY  05/27/2000   left knee  . LEFT HEART CATHETERIZATION  WITH CORONARY ANGIOGRAM N/A 06/17/2014   Procedure: LEFT HEART CATHETERIZATION WITH CORONARY ANGIOGRAM;  Surgeon: Laverda Page, MD;  Location: [facility] CATH LAB;  Service: Cardiovascular;  Laterality: N/A;  . RETINAL DETACHMENT REPAIR W/ SCLERAL BUCKLE LE  10/14/2002   Dr Zadie Rhine  . TONSILLECTOMY     age 12  . TOTAL KNEE ARTHROPLASTY  08/01/2011   Procedure: TOTAL KNEE ARTHROPLASTY;  Surgeon: Lorn Junes, MD;  Location: [place];  Service: Orthopedics;  Laterality: Right;  TOTAL KNEE ARTHROPLASTY  RIGHT SIDE  . TRANSURETHRAL RESECTION OF PROSTATE N/A 06/25/2013   Procedure: TRANSURETHRAL RESECTION OF THE PROSTATE WITH GYRUS  INSTRUMENTS;  Surgeon: [name], MD;  Location: [facility];  Service: Urology;  Laterality: N/A;  . VASECTOMY  1982    No current facility-administered medications for this encounter.    Current Outpatient Medications  Medication Sig Dispense Refill Last Dose  . aspirin EC 81 MG tablet Take 81 mg by mouth daily.   Taking  . atorvastatin (LIPITOR) 20 MG tablet Take 20 mg by mouth daily.   Taking  . buPROPion (WELLBUTRIN SR) 150 MG 12 hr tablet bupropion HCl SR 150 mg tablet,12 hr sustained-release   Taking  . celecoxib (CELEBREX) 200 MG capsule Take by mouth.   Taking  . Cholecalciferol (VITAMIN D) 2000 UNITS tablet Take 2,000 Units by mouth daily.   Taking  . citalopram (CELEXA) 40 MG tablet Take 40 mg by mouth daily.   Taking  . clopidogrel (PLAVIX) 75 MG tablet Take 75 mg by mouth daily.   Taking  . escitalopram (LEXAPRO) 20 MG tablet    Taking  . FARXIGA 10 MG TABS tablet Take 10 mg by mouth daily.    Taking  . finasteride (PROSCAR) 5 MG tablet    Taking  . lisinopril (PRINIVIL,ZESTRIL) 10 MG tablet Take 10 mg by mouth daily.   Taking  . meloxicam (MOBIC) 15 MG tablet Take 15 mg by mouth daily.   Taking  . metFORMIN (GLUCOPHAGE) 1000 MG tablet Take 1,000 mg by mouth 2 (two) times daily with a meal.   Taking  . metoprolol tartrate (LOPRESSOR) 25 MG tablet Take 25 mg by mouth 2 (two) times daily.   Taking  . mirabegron ER (MYRBETRIQ) 25 MG TB24 tablet Myrbetriq 25 mg tablet,extended release   Taking  . NITROSTAT 0.4 MG SL tablet Place 0.4 mg under the tongue every 5 (five) minutes as needed for chest pain.    Taking  . senna-docusate (SENOKOT-S) 8.6-50 MG per tablet Take 1 tablet by mouth daily as needed for mild constipation.   Taking  . terbinafine (LAMISIL) 250 MG tablet Take 1 tablet (250 mg total) by mouth daily. 90 tablet 0   . topiramate (TOPAMAX) 25 MG tablet Take 2 tablets (50 mg total) by mouth 2 (two) times daily. 120 tablet 0 Taking   Allergies  Allergen Reactions  .  Penicillins Anaphylaxis    "eyes swell shut"    Social History   Tobacco Use  . Smoking status: Never Smoker  . Smokeless tobacco: Never Used  Substance Use Topics  . Alcohol use: No    Alcohol/week: 0.0 standard drinks    Family History  Problem Relation Age of Onset  . Hypertension Mother   . Heart failure Father   . Arthritis Other   . Cancer Other        breast, skin  . Coronary artery disease Other   . Heart disease Other   . Anesthesia  problems Neg Hx   . Hypotension Neg Hx   . Malignant hyperthermia Neg Hx   . Pseudochol deficiency Neg Hx      Review of Systems  Constitutional: Negative.   HENT: Negative.   Eyes: Negative.   Respiratory: Positive for shortness of breath (with exertion).   Cardiovascular: Negative.   Gastrointestinal: Negative.   Genitourinary: Positive for frequency.       Kidney stones & kidney disease  Musculoskeletal: Positive for joint pain.  Skin: Negative.   Neurological: Positive for headaches.  Endo/Heme/Allergies: Negative.   Psychiatric/Behavioral: The patient is nervous/anxious.     Objective:  Physical Exam  Constitutional: He is oriented to person, place, and time. He appears well-developed.  HENT:  Head: Normocephalic.  Eyes: Pupils are equal, round, and reactive to light.  Neck: Neck supple. No JVD present. No tracheal deviation present. No thyromegaly present.  Cardiovascular: Normal rate, regular rhythm and intact distal pulses.  Respiratory: Effort normal and breath sounds normal. No respiratory distress. He has no wheezes.  GI: Soft. There is no tenderness. There is no guarding.  Musculoskeletal:       Right knee: He exhibits decreased range of motion, swelling and bony tenderness. He exhibits no ecchymosis, no deformity, no laceration and no erythema. Tenderness found.  Lymphadenopathy:    He has no cervical adenopathy.  Neurological: He is alert and oriented to person, place, and time.  Skin: Skin is warm and dry.   Psychiatric: He has a normal mood and affect.      Labs:  Estimated body mass index is 37.03 kg/m as calculated from the following:   Height as of 06/28/16: 6' (1.829 m).   Weight as of 06/28/16: 123.8 kg.   Imaging Review Plain radiographs demonstrate severe degenerative joint disease of the right knee. The bone quality appears to be good for age and reported activity level.   Preoperative templating of the joint replacement has been completed, documented, and submitted to the Operating Room personnel in order to optimize intra-operative equipment management.    Patient's anticipated LOS is less than 2 midnights, meeting these requirements: - Lives within 1 hour of care - Has a competent adult at home to recover with post-op recover - NO history of  - Chronic pain requiring opiods  - Coronary Artery Disease  - Heart failure  - Heart attack  - Stroke  - DVT/VTE  - Cardiac arrhythmia  - Respiratory Failure/COPD  - Anemia  - Advanced Liver disease    Assessment/Plan:  End stage arthritis, left knee   The patient history, physical examination, clinical judgment of the provider and imaging studies are consistent with end stage degenerative joint disease of the left knee(s) and total knee arthroplasty is deemed medically necessary. The treatment options including medical management, injection therapy arthroscopy and arthroplasty were discussed at length. The risks and benefits of total knee arthroplasty were presented and reviewed. The risks due to aseptic loosening, infection, stiffness, patella tracking problems, thromboembolic complications and other imponderables were discussed. The patient acknowledged the explanation, agreed to proceed with the plan and consent was signed. Patient is being admitted for inpatient treatment for surgery, pain control, PT, OT, prophylactic antibiotics, VTE prophylaxis, progressive ambulation and ADL's and discharge planning. The patient is  planning to be discharged home.     West Pugh Laneshia Pina   PA-C  04/25/2018, 11:15 AM

## 2018-04-25 NOTE — Care Plan (Signed)
L TKA scheduled on 05-22-18 DCP:  Home with spouse.  2 story home with 2 ste. DME:  No needs.  Has a RW and 3-in-1 PT:  [facility].  PT eval scheduled on 05-25-18.

## 2018-04-26 ENCOUNTER — Ambulatory Visit
Admission: RE | Admit: 2018-04-26 | Discharge: 2018-04-26 | Disposition: A | Discharge status: Home / Self Care | Payer: Medicare Other | Source: Ambulatory Visit | Attending: Internal Medicine | Admitting: Internal Medicine

## 2018-04-26 DIAGNOSIS — N183 Chronic kidney disease, stage 3 unspecified: Secondary | ICD-10-CM

## 2018-04-26 DIAGNOSIS — N200 Calculus of kidney: Secondary | ICD-10-CM | POA: Diagnosis not present

## 2018-04-26 DIAGNOSIS — N189 Chronic kidney disease, unspecified: Secondary | ICD-10-CM | POA: Diagnosis not present

## 2018-05-16 ENCOUNTER — Encounter (HOSPITAL_COMMUNITY): Payer: Self-pay

## 2018-05-16 NOTE — Progress Notes (Signed)
Lov/ surgical clearance Dr. Philip Aspen on chart

## 2018-05-16 NOTE — Patient Instructions (Addendum)
Keith [name].  05/16/2018   Your procedure is scheduled on: 05-22-18   Report to [facility] Main  Entrance    Report to admitting at 5":30AM    Call this number if you have problems the morning of surgery [phone]   PLEASE BRING BiPap MASK AND  TUBING ONLY. DEVICE WILL BE PROVIDED!    Remember: Do not eat food or drink liquids :After Midnight.     Take these medicines the morning of surgery with A SIP OF WATER: atorvastatin, escitalopram, finasteride, metoprolol  PLEASE ASK YOUR HEALTH CARE PROVIDER FOR INSTRUCTIONS ON DIABETES MEDICATION FOR DAY OF SURGERY !                               You may not have any metal on your body including hair pins and              piercings  Do not wear jewelry, make-up, lotions, powders or perfumes, deodorant              Men may shave face and neck.   Do not bring valuables to the hospital. [place].  Contacts, dentures or bridgework may not be worn into surgery.  Leave suitcase in the car. After surgery it may be brought to your room.                 Please read over the following fact sheets you were given: _____________________________________________________________________             [facility] - Preparing for Surgery Before surgery, you can play an important role.  Because skin is not sterile, your skin needs to be as free of germs as possible.  You can reduce the number of germs on your skin by washing with CHG (chlorahexidine gluconate) soap before surgery.  CHG is an antiseptic cleaner which kills germs and bonds with the skin to continue killing germs even after washing. Please DO NOT use if you have an allergy to CHG or antibacterial soaps.  If your skin becomes reddened/irritated stop using the CHG and inform your nurse when you arrive at Short Stay. Do not shave (including legs and underarms) for at least 48 hours prior to the first CHG shower.   You may shave your face/neck. Please follow these instructions carefully:  1.  Shower with CHG Soap the night before surgery and the  morning of Surgery.  2.  If you choose to wash your hair, wash your hair first as usual with your  normal  shampoo.  3.  After you shampoo, rinse your hair and body thoroughly to remove the  shampoo.                           4.  Use CHG as you would any other liquid soap.  You can apply chg directly  to the skin and wash                       Gently with a scrungie or clean washcloth.  5.  Apply the CHG Soap to your body ONLY FROM THE NECK DOWN.   Do not use on face/ open  Wound or open sores. Avoid contact with eyes, ears mouth and genitals (private parts).                       Wash face,  Genitals (private parts) with your normal soap.             6.  Wash thoroughly, paying special attention to the area where your surgery  will be performed.  7.  Thoroughly rinse your body with warm water from the neck down.  8.  DO NOT shower/wash with your normal soap after using and rinsing off  the CHG Soap.                9.  Pat yourself dry with a clean towel.            10.  Wear clean pajamas.            11.  Place clean sheets on your bed the night of your first shower and do not  sleep with pets. Day of Surgery : Do not apply any lotions/deodorants the morning of surgery.  Please wear clean clothes to the hospital/surgery center.  FAILURE TO FOLLOW THESE INSTRUCTIONS MAY RESULT IN THE CANCELLATION OF YOUR SURGERY PATIENT SIGNATURE_________________________________  NURSE SIGNATURE__________________________________  ________________________________________________________________________   Keith [name]  An incentive spirometer is a tool that can help keep your lungs clear and active. This tool measures how well you are filling your lungs with each breath. Taking long deep breaths may help reverse or decrease the chance of  developing breathing (pulmonary) problems (especially infection) following:  A long period of time when you are unable to move or be active. BEFORE THE PROCEDURE   If the spirometer includes an indicator to show your best effort, your nurse or respiratory therapist will set it to a desired goal.  If possible, sit up straight or lean slightly forward. Try not to slouch.  Hold the incentive spirometer in an upright position. INSTRUCTIONS FOR USE  1. Sit on the edge of your bed if possible, or sit up as far as you can in bed or on a chair. 2. Hold the incentive spirometer in an upright position. 3. Breathe out normally. 4. Place the mouthpiece in your mouth and seal your lips tightly around it. 5. Breathe in slowly and as deeply as possible, raising the piston or the ball toward the top of the column. 6. Hold your breath for 3-5 seconds or for as long as possible. Allow the piston or ball to fall to the bottom of the column. 7. Remove the mouthpiece from your mouth and breathe out normally. 8. Rest for a few seconds and repeat Steps 1 through 7 at least 10 times every 1-2 hours when you are awake. Take your time and take a few normal breaths between deep breaths. 9. The spirometer may include an indicator to show your best effort. Use the indicator as a goal to work toward during each repetition. 10. After each set of 10 deep breaths, practice coughing to be sure your lungs are clear. If you have an incision (the cut made at the time of surgery), support your incision when coughing by placing a pillow or rolled up towels firmly against it. Once you are able to get out of bed, walk around indoors and cough well. You may stop using the incentive spirometer when instructed by your caregiver.  RISKS AND COMPLICATIONS  Take your time so you do not get  dizzy or light-headed.  If you are in pain, you may need to take or ask for pain medication before doing incentive spirometry. It is harder to take a  deep breath if you are having pain. AFTER USE  Rest and breathe slowly and easily.  It can be helpful to keep track of a log of your progress. Your caregiver can provide you with a simple table to help with this. If you are using the spirometer at home, follow these instructions: [place] IF:   You are having difficultly using the spirometer.  You have trouble using the spirometer as often as instructed.  Your pain medication is not giving enough relief while using the spirometer.  You develop fever of 100.5 F (38.1 C) or higher. SEEK IMMEDIATE MEDICAL CARE IF:   You cough up bloody sputum that had not been present before.  You develop fever of 102 F (38.9 C) or greater.  You develop worsening pain at or near the incision site. MAKE SURE YOU:   Understand these instructions.  Will watch your condition.  Will get help right away if you are not doing well or get worse. Document Released: 01/09/2007 Document Revised: 11/21/2011 Document Reviewed: 03/12/2007 ExitCare Patient Information 2014 Exit[employer], [place].   ________________________________________________________________________  WHAT IS A BLOOD TRANSFUSION? Blood Transfusion Information  A transfusion is the replacement of blood or some of its parts. Blood is made up of multiple cells which provide different functions.  Red blood cells carry oxygen and are used for blood loss replacement.  White blood cells fight against infection.  Platelets control bleeding.  Plasma helps clot blood.  Other blood products are available for specialized needs, such as hemophilia or other clotting disorders. BEFORE THE TRANSFUSION  Who gives blood for transfusions?   Healthy volunteers who are fully evaluated to make sure their blood is safe. This is blood bank blood. Transfusion therapy is the safest it has ever been in the practice of medicine. Before blood is taken from a donor, a complete history is taken to make  sure that person has no history of diseases nor engages in risky social behavior (examples are intravenous drug use or sexual activity with multiple partners). The donor's travel history is screened to minimize risk of transmitting infections, such as malaria. The donated blood is tested for signs of infectious diseases, such as HIV and hepatitis. The blood is then tested to be sure it is compatible with you in order to minimize the chance of a transfusion reaction. If you or a relative donates blood, this is often done in anticipation of surgery and is not appropriate for emergency situations. It takes many days to process the donated blood. RISKS AND COMPLICATIONS Although transfusion therapy is very safe and saves many lives, the main dangers of transfusion include:   Getting an infectious disease.  Developing a transfusion reaction. This is an allergic reaction to something in the blood you were given. Every precaution is taken to prevent this. The decision to have a blood transfusion has been considered carefully by your caregiver before blood is given. Blood is not given unless the benefits outweigh the risks. AFTER THE TRANSFUSION  Right after receiving a blood transfusion, you will usually feel much better and more energetic. This is especially true if your red blood cells have gotten low (anemic). The transfusion raises the level of the red blood cells which carry oxygen, and this usually causes an energy increase.  The nurse administering the transfusion will  monitor you carefully for complications. HOME CARE INSTRUCTIONS  No special instructions are needed after a transfusion. You may find your energy is better. Speak with your caregiver about any limitations on activity for underlying diseases you may have. SEEK MEDICAL CARE IF:   Your condition is not improving after your transfusion.  You develop redness or irritation at the intravenous (IV) site. SEEK IMMEDIATE MEDICAL CARE IF:   Any of the following symptoms occur over the next 12 hours:  Shaking chills.  You have a temperature by mouth above 102 F (38.9 C), not controlled by medicine.  Chest, back, or muscle pain.  People around you feel you are not acting correctly or are confused.  Shortness of breath or difficulty breathing.  Dizziness and fainting.  You get a rash or develop hives.  You have a decrease in urine output.  Your urine turns a dark color or changes to pink, red, or brown. Any of the following symptoms occur over the next 10 days:  You have a temperature by mouth above 102 F (38.9 C), not controlled by medicine.  Shortness of breath.  Weakness after normal activity.  The white part of the eye turns yellow (jaundice).  You have a decrease in the amount of urine or are urinating less often.  Your urine turns a dark color or changes to pink, red, or brown. Document Released: 08/26/2000 Document Revised: 11/21/2011 Document Reviewed: 04/14/2008 [facility] Patient Information 2014 [place], [place].  _______________________________________________________________________

## 2018-05-17 ENCOUNTER — Encounter (HOSPITAL_COMMUNITY)
Admission: RE | Admit: 2018-05-17 | Discharge: 2018-05-17 | Disposition: A | Discharge status: Home / Self Care | Payer: Medicare Other | Source: Ambulatory Visit | Attending: Orthopedic Surgery | Admitting: Orthopedic Surgery

## 2018-05-17 ENCOUNTER — Other Ambulatory Visit: Payer: Self-pay

## 2018-05-17 ENCOUNTER — Encounter (HOSPITAL_COMMUNITY): Payer: Self-pay

## 2018-05-17 DIAGNOSIS — M1712 Unilateral primary osteoarthritis, left knee: Secondary | ICD-10-CM | POA: Insufficient documentation

## 2018-05-17 DIAGNOSIS — I493 Ventricular premature depolarization: Secondary | ICD-10-CM | POA: Diagnosis not present

## 2018-05-17 DIAGNOSIS — Z01818 Encounter for other preprocedural examination: Secondary | ICD-10-CM | POA: Diagnosis not present

## 2018-05-17 DIAGNOSIS — I517 Cardiomegaly: Secondary | ICD-10-CM | POA: Diagnosis not present

## 2018-05-17 DIAGNOSIS — I440 Atrioventricular block, first degree: Secondary | ICD-10-CM | POA: Diagnosis not present

## 2018-05-17 DIAGNOSIS — N401 Enlarged prostate with lower urinary tract symptoms: Secondary | ICD-10-CM | POA: Diagnosis not present

## 2018-05-17 HISTORY — DX: Chronic kidney disease, stage 3 unspecified: N18.30

## 2018-05-17 HISTORY — DX: Type 2 diabetes mellitus with diabetic chronic kidney disease: E11.22

## 2018-05-17 HISTORY — DX: Chronic kidney disease, stage 3 (moderate): N18.3

## 2018-05-17 HISTORY — DX: Major depressive disorder, single episode, unspecified: F32.9

## 2018-05-17 HISTORY — DX: Depression, unspecified: F32.A

## 2018-05-17 HISTORY — DX: Hypertensive chronic kidney disease with stage 1 through stage 4 chronic kidney disease, or unspecified chronic kidney disease: I12.9

## 2018-05-17 LAB — BASIC METABOLIC PANEL
Anion gap: 9 (ref 5–15)
BUN: 29 mg/dL — ABNORMAL HIGH (ref 8–23)
CO2: 25 mmol/L (ref 22–32)
Calcium: 9 mg/dL (ref 8.9–10.3)
Chloride: 103 mmol/L (ref 98–111)
Creatinine, Ser: 2.23 mg/dL — ABNORMAL HIGH (ref 0.61–1.24)
GFR calc Af Amer: 32 mL/min — ABNORMAL LOW (ref >60)
GFR calc non Af Amer: 28 mL/min — ABNORMAL LOW (ref >60)
Glucose, Bld: 112 mg/dL — ABNORMAL HIGH (ref 70–99)
Potassium: 4.7 mmol/L (ref 3.5–5.1)
Sodium: 137 mmol/L (ref 135–145)

## 2018-05-17 LAB — CBC
HCT: 42.5 % (ref 39.0–52.0)
Hemoglobin: 14.1 g/dL (ref 13.0–17.0)
MCH: 30.6 pg (ref 26.0–34.0)
MCHC: 33.2 g/dL (ref 30.0–36.0)
MCV: 92.2 fL (ref 78.0–100.0)
Platelets: 287 10*3/uL (ref 150–400)
RBC: 4.61 MIL/uL (ref 4.22–5.81)
RDW: 13.9 % (ref 11.5–15.5)
WBC: 8.1 10*3/uL (ref 4.0–10.5)

## 2018-05-17 LAB — ABO/RH: ABO/RH(D): A POS

## 2018-05-17 LAB — SURGICAL PCR SCREEN
MRSA, PCR: NEGATIVE
Staphylococcus aureus: NEGATIVE

## 2018-05-17 LAB — GLUCOSE, CAPILLARY: Glucose-Capillary: 126 mg/dL — ABNORMAL HIGH (ref 70–99)

## 2018-05-17 LAB — HEMOGLOBIN A1C
Hgb A1c MFr Bld: 6 % — ABNORMAL HIGH (ref 4.8–5.6)
Mean Plasma Glucose: 125.5 mg/dL

## 2018-05-17 NOTE — Progress Notes (Signed)
BMP ROUTED VIA EPIC TO DR Alvan Dame

## 2018-05-17 NOTE — Progress Notes (Signed)
Patient presents to pre-op appt with wife. Upon checking vitals, VS read: BP 140/60,  P(fluctuating lowest 25, highest 36), RR 16, 02 SAT 97% ORA,  T98    PATIENT DENIES THE FOLLOWING: CP, FEELING OF HEART SKIPPING BEATS, HX OF DYSRHYTHMIA , N/V,  VISION CHANGE, SOB UNLESS OVERLY EXERTED ; ENDORSES THE FOLLOWING  "SPINAL HEADACHE" YESTERDAY FOR 2 HOURS HAVING RELIEF WITH TYLENOL.  PATIENT ALSO REPORTS TAKING METOPROLOL THIS TODAY ALTHOUGH WIFE ADMITS THAT HE HAS SKIPPED THE PREVIOUS 2 DOSES.     EKG OBTAINED AT PRE-OP AND ANESTHESIA CALLED FOR CONSULT. PER ANESTHESIA DR Iona Beard ROSE, PATIENT WILL NEED CARDIAC CLEARANCE BEFORE PROCEEDING WITH SURGERY.   RN RELAYED RECOMMENDATION TO PATIENT. PATIENT REPORTS HE HASN'T VSEEN CARDIOLOGY IN 3 YEARS BUT HE WAS SEEING A DR. GANJI PREVIOUSLY. RN EXPLAINED TO PATIENT THAT HIS SURGEON WILL BE MADE AWARE OF ANESTHESIA RECOMMENDATIONS ANT THEY WOULD F/U WITH HIM. PATIENT VERBALIZED UNDERSTANDING

## 2018-05-17 NOTE — Progress Notes (Signed)
RN CALLED AND LVMM FOR SS Keith [name] REQUESTING CARDIAC CLEARANCE . RN WILL CONTINUE TO F/U

## 2018-05-18 DIAGNOSIS — E782 Mixed hyperlipidemia: Secondary | ICD-10-CM | POA: Diagnosis not present

## 2018-05-18 DIAGNOSIS — G4731 Primary central sleep apnea: Secondary | ICD-10-CM | POA: Diagnosis not present

## 2018-05-18 DIAGNOSIS — I493 Ventricular premature depolarization: Secondary | ICD-10-CM | POA: Diagnosis not present

## 2018-05-18 DIAGNOSIS — Z01810 Encounter for preprocedural cardiovascular examination: Secondary | ICD-10-CM | POA: Diagnosis not present

## 2018-05-18 NOTE — Progress Notes (Signed)
Spoke with Kathryne Eriksson again. Per [name] patient is scheduled for in office visit with cardiologist Dr [name] and or his PA at 1:00pm today. Per [name] , if patient needs to have cardiac w/u or not , she will let pre-op RN know and will send associated documentation. [name] also state that [employer] will be handling [facility] patients next week and RN may get in touch with Claiborne Billings for f/u on patient if necessary. RN verbalized understanding

## 2018-05-18 NOTE — Progress Notes (Addendum)
Received clearance document from [facility] this morning. RN took clearance over to anesthesia Dr Suella Broad for review. Per Dr Smith Robert, since patient has not seen cardiologist "in office" since 2015 and with bradycardic event at pre-op visit, patient would need to see cardiologist in office to provide clearance before proceeding. Per protocol, RN contacted surgeon office to make aware; called and LVMM for [name] explaining the abovementioned.    [id number] Received call back from [facility]. Sherry to contact cardiologist office to organize in office visit for cardiac clearance. RN will continue to f/u

## 2018-05-21 DIAGNOSIS — R351 Nocturia: Secondary | ICD-10-CM | POA: Diagnosis not present

## 2018-05-21 DIAGNOSIS — N200 Calculus of kidney: Secondary | ICD-10-CM | POA: Diagnosis not present

## 2018-05-21 DIAGNOSIS — N401 Enlarged prostate with lower urinary tract symptoms: Secondary | ICD-10-CM | POA: Diagnosis not present

## 2018-05-21 DIAGNOSIS — N3941 Urge incontinence: Secondary | ICD-10-CM | POA: Diagnosis not present

## 2018-05-21 MED ORDER — SODIUM CHLORIDE 0.9 % IV SOLN
1500.0000 mg | INTRAVENOUS | Status: AC
  Administered 2018-05-22: 1500 mg via INTRAVENOUS
  Filled 2018-05-21: qty 1500

## 2018-05-21 MED ORDER — GENTAMICIN SULFATE 40 MG/ML IJ SOLN
5.0000 mg/kg | INTRAMUSCULAR | Status: AC
  Administered 2018-05-22: 460.4 mg via INTRAVENOUS
  Filled 2018-05-21: qty 11.5

## 2018-05-21 NOTE — Anesthesia Preprocedure Evaluation (Addendum)
Anesthesia Evaluation  Patient identified by MRN, date of birth, ID band Patient awake    Reviewed: Allergy & Precautions, NPO status , Patient's Chart, lab work & pertinent test results, reviewed documented beta blocker date and time   History of Anesthesia Complications (+) PONV, POST - OP SPINAL HEADACHE and history of anesthetic complications  Airway Mallampati: I  TM Distance: >3 FB Neck ROM: Full    Dental  (+) Teeth Intact, Caps   Pulmonary sleep apnea and Continuous Positive Airway Pressure Ventilation ,    Pulmonary exam normal breath sounds clear to auscultation       Cardiovascular hypertension, Pt. on medications and Pt. on home beta blockers + angina Normal cardiovascular exam Rhythm:Irregular Rate:Bradycardia     Neuro/Psych  Headaches, PSYCHIATRIC DISORDERS Anxiety Depression    GI/Hepatic negative GI ROS, Neg liver ROS,   Endo/Other  diabetes, Well Controlled, Type 2, Oral Hypoglycemic Agents  Renal/GU Renal InsufficiencyRenal disease   BPH ED    Musculoskeletal  (+) Arthritis , Osteoarthritis,    Abdominal (+) + obese,   Peds  Hematology negative hematology ROS (+)   Anesthesia Other Findings   Reproductive/Obstetrics                           Anesthesia Physical Anesthesia Plan  ASA: III  Anesthesia Plan: Spinal   Post-op Pain Management:  Regional for Post-op pain   Induction: Intravenous  PONV Risk Score and Plan: 3 and Propofol infusion, Dexamethasone, Ondansetron and Treatment may vary due to age or medical condition  Airway Management Planned: Natural Airway and Simple Face Mask  Additional Equipment:   Intra-op Plan:   Post-operative Plan:   Informed Consent: I have reviewed the patients History and Physical, chart, labs and discussed the procedure including the risks, benefits and alternatives for the proposed anesthesia with the patient or authorized  representative who has indicated his/her understanding and acceptance.   Dental advisory given  Plan Discussed with: CRNA and Surgeon  Anesthesia Plan Comments: (Use 24Ga [place]encan for SAB)       Anesthesia Quick Evaluation

## 2018-05-21 NOTE — Progress Notes (Signed)
Cardiac clearance received ; patient  cleared by Dr [name] at [facility] on 05-18-18 . Cardiac clearance placed on patient chart

## 2018-05-22 ENCOUNTER — Encounter (HOSPITAL_COMMUNITY)
Admission: RE | Disposition: A | Discharge status: Home / Self Care | Payer: Self-pay | Source: Ambulatory Visit | Attending: Orthopedic Surgery

## 2018-05-22 ENCOUNTER — Observation Stay (HOSPITAL_COMMUNITY)
Admission: RE | Admit: 2018-05-22 | Discharge: 2018-05-23 | Disposition: A | Discharge status: Home / Self Care | Payer: Medicare Other | Source: Ambulatory Visit | Attending: Orthopedic Surgery | Admitting: Orthopedic Surgery

## 2018-05-22 ENCOUNTER — Other Ambulatory Visit: Payer: Self-pay

## 2018-05-22 ENCOUNTER — Inpatient Hospital Stay (HOSPITAL_COMMUNITY): Payer: Medicare Other | Admitting: Anesthesiology

## 2018-05-22 ENCOUNTER — Encounter (HOSPITAL_COMMUNITY): Payer: Self-pay | Admitting: *Deleted

## 2018-05-22 DIAGNOSIS — N401 Enlarged prostate with lower urinary tract symptoms: Secondary | ICD-10-CM | POA: Insufficient documentation

## 2018-05-22 DIAGNOSIS — I129 Hypertensive chronic kidney disease with stage 1 through stage 4 chronic kidney disease, or unspecified chronic kidney disease: Secondary | ICD-10-CM | POA: Insufficient documentation

## 2018-05-22 DIAGNOSIS — Z7982 Long term (current) use of aspirin: Secondary | ICD-10-CM | POA: Diagnosis not present

## 2018-05-22 DIAGNOSIS — Z791 Long term (current) use of non-steroidal anti-inflammatories (NSAID): Secondary | ICD-10-CM | POA: Insufficient documentation

## 2018-05-22 DIAGNOSIS — Z79899 Other long term (current) drug therapy: Secondary | ICD-10-CM | POA: Insufficient documentation

## 2018-05-22 DIAGNOSIS — E785 Hyperlipidemia, unspecified: Secondary | ICD-10-CM | POA: Insufficient documentation

## 2018-05-22 DIAGNOSIS — Z7902 Long term (current) use of antithrombotics/antiplatelets: Secondary | ICD-10-CM | POA: Diagnosis not present

## 2018-05-22 DIAGNOSIS — E119 Type 2 diabetes mellitus without complications: Secondary | ICD-10-CM | POA: Diagnosis not present

## 2018-05-22 DIAGNOSIS — E1122 Type 2 diabetes mellitus with diabetic chronic kidney disease: Secondary | ICD-10-CM | POA: Insufficient documentation

## 2018-05-22 DIAGNOSIS — N138 Other obstructive and reflux uropathy: Secondary | ICD-10-CM | POA: Insufficient documentation

## 2018-05-22 DIAGNOSIS — Z96651 Presence of right artificial knee joint: Secondary | ICD-10-CM | POA: Diagnosis not present

## 2018-05-22 DIAGNOSIS — F419 Anxiety disorder, unspecified: Secondary | ICD-10-CM | POA: Diagnosis not present

## 2018-05-22 DIAGNOSIS — Z7984 Long term (current) use of oral hypoglycemic drugs: Secondary | ICD-10-CM | POA: Diagnosis not present

## 2018-05-22 DIAGNOSIS — G4730 Sleep apnea, unspecified: Secondary | ICD-10-CM | POA: Diagnosis not present

## 2018-05-22 DIAGNOSIS — Z96659 Presence of unspecified artificial knee joint: Secondary | ICD-10-CM

## 2018-05-22 DIAGNOSIS — M1712 Unilateral primary osteoarthritis, left knee: Principal | ICD-10-CM | POA: Insufficient documentation

## 2018-05-22 DIAGNOSIS — F329 Major depressive disorder, single episode, unspecified: Secondary | ICD-10-CM | POA: Insufficient documentation

## 2018-05-22 DIAGNOSIS — E669 Obesity, unspecified: Secondary | ICD-10-CM | POA: Diagnosis present

## 2018-05-22 DIAGNOSIS — N183 Chronic kidney disease, stage 3 (moderate): Secondary | ICD-10-CM | POA: Insufficient documentation

## 2018-05-22 DIAGNOSIS — I10 Essential (primary) hypertension: Secondary | ICD-10-CM | POA: Diagnosis not present

## 2018-05-22 DIAGNOSIS — G8918 Other acute postprocedural pain: Secondary | ICD-10-CM | POA: Diagnosis not present

## 2018-05-22 HISTORY — PX: TOTAL KNEE ARTHROPLASTY: SHX125

## 2018-05-22 LAB — GLUCOSE, CAPILLARY
Glucose-Capillary: 131 mg/dL — ABNORMAL HIGH (ref 70–99)
Glucose-Capillary: 101 mg/dL — ABNORMAL HIGH (ref 70–99)
Glucose-Capillary: 124 mg/dL — ABNORMAL HIGH (ref 70–99)
Glucose-Capillary: 162 mg/dL — ABNORMAL HIGH (ref 70–99)

## 2018-05-22 SURGERY — ARTHROPLASTY, KNEE, TOTAL
Anesthesia: Spinal | Site: Knee | Laterality: Left

## 2018-05-22 MED ORDER — TERBINAFINE HCL 250 MG PO TABS
250.0000 mg | ORAL_TABLET | Freq: Every day | ORAL | Status: DC
  Administered 2018-05-23: 250 mg via ORAL
  Filled 2018-05-22: qty 1

## 2018-05-22 MED ORDER — 0.9 % SODIUM CHLORIDE (POUR BTL) OPTIME
TOPICAL | Status: DC | PRN
  Administered 2018-05-22: 1000 mL

## 2018-05-22 MED ORDER — ONDANSETRON HCL 4 MG PO TABS: 4.0000 mg | ORAL_TABLET | Freq: Four times a day (QID) | ORAL | Status: DC | PRN

## 2018-05-22 MED ORDER — MIDAZOLAM HCL 2 MG/2ML IJ SOLN
INTRAMUSCULAR | Status: AC
  Filled 2018-05-22: qty 2

## 2018-05-22 MED ORDER — FERROUS SULFATE 325 (65 FE) MG PO TABS
325.0000 mg | ORAL_TABLET | Freq: Two times a day (BID) | ORAL | Status: DC
  Administered 2018-05-22 – 2018-05-23 (×2): 325 mg via ORAL
  Filled 2018-05-22 (×2): qty 1

## 2018-05-22 MED ORDER — BUPIVACAINE-EPINEPHRINE (PF) 0.5% -1:200000 IJ SOLN
INTRAMUSCULAR | Status: AC
  Filled 2018-05-22: qty 30

## 2018-05-22 MED ORDER — DEXAMETHASONE SODIUM PHOSPHATE 10 MG/ML IJ SOLN
INTRAMUSCULAR | Status: AC
  Filled 2018-05-22: qty 1

## 2018-05-22 MED ORDER — PHENOL 1.4 % MT LIQD: 1.0000 | OROMUCOSAL | Status: DC | PRN

## 2018-05-22 MED ORDER — POLYETHYLENE GLYCOL 3350 17 G PO PACK
17.0000 g | PACK | Freq: Two times a day (BID) | ORAL | Status: DC
  Administered 2018-05-22 (×2): 17 g via ORAL
  Filled 2018-05-22 (×3): qty 1

## 2018-05-22 MED ORDER — FENTANYL CITRATE (PF) 100 MCG/2ML IJ SOLN
INTRAMUSCULAR | Status: AC
  Filled 2018-05-22: qty 2

## 2018-05-22 MED ORDER — PROPOFOL 10 MG/ML IV BOLUS
INTRAVENOUS | Status: DC | PRN
  Administered 2018-05-22: 30 mg via INTRAVENOUS

## 2018-05-22 MED ORDER — BUPIVACAINE IN DEXTROSE 0.75-8.25 % IT SOLN
INTRATHECAL | Status: DC | PRN
  Administered 2018-05-22: 2 mL via INTRATHECAL

## 2018-05-22 MED ORDER — HYDROCODONE-ACETAMINOPHEN 7.5-325 MG PO TABS
1.0000 | ORAL_TABLET | ORAL | Status: DC | PRN
  Administered 2018-05-22 (×2): 1 via ORAL
  Filled 2018-05-22 (×2): qty 1

## 2018-05-22 MED ORDER — ONDANSETRON HCL 4 MG/2ML IJ SOLN
INTRAMUSCULAR | Status: DC | PRN
  Administered 2018-05-22: 4 mg via INTRAVENOUS

## 2018-05-22 MED ORDER — TRANEXAMIC ACID 1000 MG/10ML IV SOLN
1000.0000 mg | INTRAVENOUS | Status: AC
  Administered 2018-05-22: 1000 mg via INTRAVENOUS
  Filled 2018-05-22: qty 10

## 2018-05-22 MED ORDER — PROPOFOL 10 MG/ML IV BOLUS
INTRAVENOUS | Status: AC
  Filled 2018-05-22: qty 20

## 2018-05-22 MED ORDER — KETOROLAC TROMETHAMINE 30 MG/ML IJ SOLN
INTRAMUSCULAR | Status: DC | PRN
  Administered 2018-05-22: 30 mg

## 2018-05-22 MED ORDER — ACETAMINOPHEN 325 MG PO TABS: 325.0000 mg | ORAL_TABLET | Freq: Four times a day (QID) | ORAL | Status: DC | PRN

## 2018-05-22 MED ORDER — STERILE WATER FOR IRRIGATION IR SOLN
Status: DC | PRN
  Administered 2018-05-22: 1000 mL

## 2018-05-22 MED ORDER — SODIUM CHLORIDE 0.9 % IJ SOLN
INTRAMUSCULAR | Status: AC
  Filled 2018-05-22: qty 50

## 2018-05-22 MED ORDER — METOCLOPRAMIDE HCL 5 MG PO TABS: 5.0000 mg | ORAL_TABLET | Freq: Three times a day (TID) | ORAL | Status: DC | PRN

## 2018-05-22 MED ORDER — VANCOMYCIN HCL IN DEXTROSE 1-5 GM/200ML-% IV SOLN
1000.0000 mg | Freq: Two times a day (BID) | INTRAVENOUS | Status: AC
  Administered 2018-05-22: 1000 mg via INTRAVENOUS
  Filled 2018-05-22: qty 200

## 2018-05-22 MED ORDER — FENTANYL CITRATE (PF) 100 MCG/2ML IJ SOLN
INTRAMUSCULAR | Status: DC | PRN
  Administered 2018-05-22 (×2): 50 ug via INTRAVENOUS

## 2018-05-22 MED ORDER — ESCITALOPRAM OXALATE 20 MG PO TABS
20.0000 mg | ORAL_TABLET | Freq: Every day | ORAL | Status: DC
  Administered 2018-05-23: 20 mg via ORAL
  Filled 2018-05-22: qty 1

## 2018-05-22 MED ORDER — MIDAZOLAM HCL 5 MG/5ML IJ SOLN
INTRAMUSCULAR | Status: DC | PRN
  Administered 2018-05-22 (×2): 1 mg via INTRAVENOUS

## 2018-05-22 MED ORDER — HYDROCODONE-ACETAMINOPHEN 7.5-325 MG PO TABS: 1.0000 | ORAL_TABLET | ORAL | 0 refills | Status: DC | PRN

## 2018-05-22 MED ORDER — METHOCARBAMOL 500 MG IVPB - SIMPLE MED
INTRAVENOUS | Status: AC
  Filled 2018-05-22: qty 50

## 2018-05-22 MED ORDER — BUPIVACAINE HCL (PF) 0.25 % IJ SOLN
INTRAMUSCULAR | Status: AC
  Filled 2018-05-22: qty 30

## 2018-05-22 MED ORDER — PROPOFOL 500 MG/50ML IV EMUL
INTRAVENOUS | Status: DC | PRN
  Administered 2018-05-22: 100 ug/kg/min via INTRAVENOUS

## 2018-05-22 MED ORDER — KETOROLAC TROMETHAMINE 30 MG/ML IJ SOLN
INTRAMUSCULAR | Status: AC
  Filled 2018-05-22: qty 1

## 2018-05-22 MED ORDER — ONDANSETRON HCL 4 MG/2ML IJ SOLN
INTRAMUSCULAR | Status: AC
  Filled 2018-05-22: qty 2

## 2018-05-22 MED ORDER — MEPERIDINE HCL 50 MG/ML IJ SOLN: 6.2500 mg | INTRAMUSCULAR | Status: DC | PRN

## 2018-05-22 MED ORDER — DEXAMETHASONE SODIUM PHOSPHATE 10 MG/ML IJ SOLN
10.0000 mg | Freq: Once | INTRAMUSCULAR | Status: AC
  Administered 2018-05-22: 10 mg via INTRAVENOUS

## 2018-05-22 MED ORDER — MORPHINE SULFATE (PF) 2 MG/ML IV SOLN
0.5000 mg | INTRAVENOUS | Status: DC | PRN
  Administered 2018-05-23: 1 mg via INTRAVENOUS
  Filled 2018-05-22: qty 1

## 2018-05-22 MED ORDER — FERROUS SULFATE 325 (65 FE) MG PO TABS: 325.0000 mg | ORAL_TABLET | Freq: Three times a day (TID) | ORAL | 3 refills | Status: DC

## 2018-05-22 MED ORDER — CHLORHEXIDINE GLUCONATE 4 % EX LIQD: 60.0000 mL | Freq: Once | CUTANEOUS | Status: DC

## 2018-05-22 MED ORDER — ALUM & MAG HYDROXIDE-SIMETH 200-200-20 MG/5ML PO SUSP: 15.0000 mL | ORAL | Status: DC | PRN

## 2018-05-22 MED ORDER — BUPIVACAINE HCL (PF) 0.25 % IJ SOLN
INTRAMUSCULAR | Status: DC | PRN
  Administered 2018-05-22: 30 mL

## 2018-05-22 MED ORDER — ASPIRIN EC 81 MG PO TBEC
81.0000 mg | DELAYED_RELEASE_TABLET | Freq: Every day | ORAL | Status: DC
  Administered 2018-05-23: 81 mg via ORAL
  Filled 2018-05-22: qty 1

## 2018-05-22 MED ORDER — HYDROCODONE-ACETAMINOPHEN 5-325 MG PO TABS
1.0000 | ORAL_TABLET | ORAL | Status: DC | PRN
  Administered 2018-05-22: 1 via ORAL
  Administered 2018-05-23: 2 via ORAL
  Filled 2018-05-22: qty 2
  Filled 2018-05-22: qty 1

## 2018-05-22 MED ORDER — DOCUSATE SODIUM 100 MG PO CAPS
100.0000 mg | ORAL_CAPSULE | Freq: Two times a day (BID) | ORAL | Status: DC
  Administered 2018-05-22 – 2018-05-23 (×3): 100 mg via ORAL
  Filled 2018-05-22 (×3): qty 1

## 2018-05-22 MED ORDER — SODIUM CHLORIDE 0.9 % IR SOLN
Status: DC | PRN
  Administered 2018-05-22: 1000 mL

## 2018-05-22 MED ORDER — DEXAMETHASONE SODIUM PHOSPHATE 10 MG/ML IJ SOLN
10.0000 mg | Freq: Once | INTRAMUSCULAR | Status: AC
  Administered 2018-05-23: 10 mg via INTRAVENOUS
  Filled 2018-05-22: qty 1

## 2018-05-22 MED ORDER — SODIUM CHLORIDE 0.9 % IJ SOLN
INTRAMUSCULAR | Status: DC | PRN
  Administered 2018-05-22: 30 mL

## 2018-05-22 MED ORDER — ONDANSETRON HCL 4 MG/2ML IJ SOLN: 4.0000 mg | Freq: Once | INTRAMUSCULAR | Status: DC | PRN

## 2018-05-22 MED ORDER — BISACODYL 10 MG RE SUPP: 10.0000 mg | Freq: Every day | RECTAL | Status: DC | PRN

## 2018-05-22 MED ORDER — METOCLOPRAMIDE HCL 5 MG/ML IJ SOLN: 5.0000 mg | Freq: Three times a day (TID) | INTRAMUSCULAR | Status: DC | PRN

## 2018-05-22 MED ORDER — METHOCARBAMOL 500 MG PO TABS
500.0000 mg | ORAL_TABLET | Freq: Four times a day (QID) | ORAL | Status: DC | PRN
  Administered 2018-05-22 – 2018-05-23 (×3): 500 mg via ORAL
  Filled 2018-05-22 (×3): qty 1

## 2018-05-22 MED ORDER — SODIUM CHLORIDE 0.9 % IV SOLN
INTRAVENOUS | Status: DC
  Administered 2018-05-22 – 2018-05-23 (×2): via INTRAVENOUS

## 2018-05-22 MED ORDER — LACTATED RINGERS IV SOLN
INTRAVENOUS | Status: DC
  Administered 2018-05-22 (×2): via INTRAVENOUS

## 2018-05-22 MED ORDER — ROPIVACAINE HCL 7.5 MG/ML IJ SOLN
INTRAMUSCULAR | Status: DC | PRN
  Administered 2018-05-22: 20 mL via PERINEURAL

## 2018-05-22 MED ORDER — TRANEXAMIC ACID 1000 MG/10ML IV SOLN
1000.0000 mg | Freq: Once | INTRAVENOUS | Status: AC
  Administered 2018-05-22: 1000 mg via INTRAVENOUS
  Filled 2018-05-22: qty 1000

## 2018-05-22 MED ORDER — ATORVASTATIN CALCIUM 20 MG PO TABS
20.0000 mg | ORAL_TABLET | Freq: Every day | ORAL | Status: DC
  Administered 2018-05-23: 20 mg via ORAL
  Filled 2018-05-22: qty 1

## 2018-05-22 MED ORDER — DOCUSATE SODIUM 100 MG PO CAPS: 100.0000 mg | ORAL_CAPSULE | Freq: Two times a day (BID) | ORAL | 0 refills | Status: DC

## 2018-05-22 MED ORDER — DIPHENHYDRAMINE HCL 12.5 MG/5ML PO ELIX: 12.5000 mg | ORAL_SOLUTION | ORAL | Status: DC | PRN

## 2018-05-22 MED ORDER — CELECOXIB 200 MG PO CAPS
200.0000 mg | ORAL_CAPSULE | Freq: Two times a day (BID) | ORAL | Status: DC
  Administered 2018-05-22 (×2): 200 mg via ORAL
  Filled 2018-05-22 (×2): qty 1

## 2018-05-22 MED ORDER — METHOCARBAMOL 500 MG IVPB - SIMPLE MED
500.0000 mg | Freq: Four times a day (QID) | INTRAVENOUS | Status: DC | PRN
  Administered 2018-05-22: 500 mg via INTRAVENOUS
  Filled 2018-05-22: qty 50

## 2018-05-22 MED ORDER — NITROGLYCERIN 0.4 MG SL SUBL: 0.4000 mg | SUBLINGUAL_TABLET | SUBLINGUAL | Status: DC | PRN

## 2018-05-22 MED ORDER — FINASTERIDE 5 MG PO TABS
5.0000 mg | ORAL_TABLET | Freq: Every day | ORAL | Status: DC
  Administered 2018-05-23: 5 mg via ORAL
  Filled 2018-05-22: qty 1

## 2018-05-22 MED ORDER — MIRABEGRON ER 25 MG PO TB24
25.0000 mg | ORAL_TABLET | Freq: Every day | ORAL | Status: DC
  Administered 2018-05-23: 25 mg via ORAL
  Filled 2018-05-22: qty 1

## 2018-05-22 MED ORDER — MENTHOL 3 MG MT LOZG: 1.0000 | LOZENGE | OROMUCOSAL | Status: DC | PRN

## 2018-05-22 MED ORDER — METHOCARBAMOL 500 MG PO TABS: 500.0000 mg | ORAL_TABLET | Freq: Four times a day (QID) | ORAL | 0 refills | Status: DC | PRN

## 2018-05-22 MED ORDER — METOPROLOL TARTRATE 25 MG PO TABS
25.0000 mg | ORAL_TABLET | Freq: Two times a day (BID) | ORAL | Status: DC
  Administered 2018-05-22 – 2018-05-23 (×2): 25 mg via ORAL
  Filled 2018-05-22 (×2): qty 1

## 2018-05-22 MED ORDER — PROPOFOL 10 MG/ML IV BOLUS
INTRAVENOUS | Status: AC
  Filled 2018-05-22: qty 40

## 2018-05-22 MED ORDER — HYDROMORPHONE HCL 1 MG/ML IJ SOLN: 0.2500 mg | INTRAMUSCULAR | Status: DC | PRN

## 2018-05-22 MED ORDER — MAGNESIUM CITRATE PO SOLN: 1.0000 | Freq: Once | ORAL | Status: DC | PRN

## 2018-05-22 MED ORDER — ONDANSETRON HCL 4 MG/2ML IJ SOLN: 4.0000 mg | Freq: Four times a day (QID) | INTRAMUSCULAR | Status: DC | PRN

## 2018-05-22 MED ORDER — POLYETHYLENE GLYCOL 3350 17 G PO PACK: 17.0000 g | PACK | Freq: Two times a day (BID) | ORAL | 0 refills | Status: DC

## 2018-05-22 MED ORDER — CLOPIDOGREL BISULFATE 75 MG PO TABS
75.0000 mg | ORAL_TABLET | Freq: Every day | ORAL | Status: DC
  Administered 2018-05-23: 75 mg via ORAL
  Filled 2018-05-22: qty 1

## 2018-05-22 SURGICAL SUPPLY — 53 items
ADH SKN CLS APL DERMABOND .7 (GAUZE/BANDAGES/DRESSINGS) ×1
ATTUNE MED ANAT PAT 41 KNEE (Knees) ×2 IMPLANT
ATTUNE PS FEM LT SZ 6 CEM KNEE (Femur) ×1 IMPLANT
ATTUNE PSRP INSR SZ6 8 KNEE (Insert) ×1 IMPLANT
BAG SPEC THK2 15X12 ZIP CLS (MISCELLANEOUS)
BAG ZIPLOCK 12X15 (MISCELLANEOUS) IMPLANT
BANDAGE ACE 6X5 VEL STRL LF (GAUZE/BANDAGES/DRESSINGS) ×2 IMPLANT
BASE TIBIAL ROT PLAT SZ 7 KNEE (Knees) ×1 IMPLANT
BLADE SAW SGTL 11.0X1.19X90.0M (BLADE) IMPLANT
BLADE SAW SGTL 13.0X1.19X90.0M (BLADE) ×2 IMPLANT
BOWL SMART MIX CTS (DISPOSABLE) ×2 IMPLANT
BSPLAT TIB 7 CMNT ROT PLAT STR (Knees) ×1 IMPLANT
CEMENT HV SMART SET (Cement) ×4 IMPLANT
COVER SURGICAL LIGHT HANDLE (MISCELLANEOUS) ×2 IMPLANT
CUFF TOURN SGL QUICK 34 (TOURNIQUET CUFF) ×2
CUFF TRNQT CYL 34X4X40X1 (TOURNIQUET CUFF) ×1 IMPLANT
DECANTER SPIKE VIAL GLASS SM (MISCELLANEOUS) ×4 IMPLANT
DERMABOND ADVANCED (GAUZE/BANDAGES/DRESSINGS) ×1
DERMABOND ADVANCED .7 DNX12 (GAUZE/BANDAGES/DRESSINGS) ×1 IMPLANT
DRAPE U-SHAPE 47X51 STRL (DRAPES) ×2 IMPLANT
DRESSING AQUACEL AG SP 3.5X10 (GAUZE/BANDAGES/DRESSINGS) ×1 IMPLANT
DRSG AQUACEL AG SP 3.5X10 (GAUZE/BANDAGES/DRESSINGS) ×2
DURAPREP 26ML APPLICATOR (WOUND CARE) ×4 IMPLANT
ELECT REM PT RETURN 15FT ADLT (MISCELLANEOUS) ×2 IMPLANT
GLOVE BIOGEL M 7.0 STRL (GLOVE) IMPLANT
GLOVE BIOGEL PI IND STRL 7.5 (GLOVE) ×1 IMPLANT
GLOVE BIOGEL PI IND STRL 8.5 (GLOVE) ×1 IMPLANT
GLOVE BIOGEL PI INDICATOR 7.5 (GLOVE) ×1
GLOVE BIOGEL PI INDICATOR 8.5 (GLOVE) ×1
GLOVE ECLIPSE 8.0 STRL XLNG CF (GLOVE) ×2 IMPLANT
GLOVE ORTHO TXT STRL SZ7.5 (GLOVE) ×4 IMPLANT
GOWN STRL REUS W/TWL 2XL LVL3 (GOWN DISPOSABLE) ×2 IMPLANT
GOWN STRL REUS W/TWL LRG LVL3 (GOWN DISPOSABLE) ×2 IMPLANT
HANDPIECE INTERPULSE COAX TIP (DISPOSABLE) ×2
HOLDER FOLEY CATH W/STRAP (MISCELLANEOUS) ×2 IMPLANT
MANIFOLD NEPTUNE II (INSTRUMENTS) ×2 IMPLANT
NDL SAFETY ECLIPSE 18X1.5 (NEEDLE) ×1 IMPLANT
NEEDLE HYPO 18GX1.5 SHARP (NEEDLE) ×2
PACK TOTAL KNEE CUSTOM (KITS) ×2 IMPLANT
POSITIONER SURGICAL ARM (MISCELLANEOUS) ×2 IMPLANT
SET HNDPC FAN SPRY TIP SCT (DISPOSABLE) ×1 IMPLANT
SET PAD KNEE POSITIONER (MISCELLANEOUS) ×2 IMPLANT
SUT MNCRL AB 4-0 PS2 18 (SUTURE) ×2 IMPLANT
SUT STRATAFIX PDS+ 0 24IN (SUTURE) ×2 IMPLANT
SUT VIC AB 1 CT1 36 (SUTURE) ×2 IMPLANT
SUT VIC AB 2-0 CT1 27 (SUTURE) ×6
SUT VIC AB 2-0 CT1 TAPERPNT 27 (SUTURE) ×3 IMPLANT
SYRINGE 3CC LL L/F (MISCELLANEOUS) ×2 IMPLANT
TIBIAL BASE ROT PLAT SZ 7 KNEE (Knees) ×2 IMPLANT
TRAY FOLEY MTR SLVR 16FR STAT (SET/KITS/TRAYS/PACK) ×2 IMPLANT
WATER STERILE IRR 1000ML POUR (IV SOLUTION) ×2 IMPLANT
WRAP KNEE MAXI GEL POST OP (GAUZE/BANDAGES/DRESSINGS) ×2 IMPLANT
YANKAUER SUCT BULB TIP 10FT TU (MISCELLANEOUS) ×2 IMPLANT

## 2018-05-22 NOTE — Interval H&P Note (Signed)
History and Physical Interval Note:  05/22/2018 7:14 AM  [name]  has presented today for surgery, with the diagnosis of left knee osteoarthritis  The various methods of treatment have been discussed with the patient and family. After consideration of risks, benefits and other options for treatment, the patient has consented to  Procedure(s): LEFT TOTAL KNEE ARTHROPLASTY (Left) as a surgical intervention .  The patient's history has been reviewed, patient examined, no change in status, stable for surgery.  I have reviewed the patient's chart and labs.  Questions were answered to the patient's satisfaction.     Mauri Pole

## 2018-05-22 NOTE — Anesthesia Postprocedure Evaluation (Signed)
Anesthesia Post Note  Patient: Keith [name].  Procedure(s) Performed: LEFT TOTAL KNEE ARTHROPLASTY (Left Knee)     Patient location during evaluation: PACU Anesthesia Type: Spinal Level of consciousness: oriented and awake and alert Pain management: pain level controlled Vital Signs Assessment: post-procedure vital signs reviewed and stable Respiratory status: spontaneous breathing, respiratory function stable, patient connected to nasal cannula oxygen and nonlabored ventilation Cardiovascular status: blood pressure returned to baseline and stable Postop Assessment: no headache, no backache, no apparent nausea or vomiting, spinal receding and patient able to bend at knees Anesthetic complications: no    Last Vitals:  Vitals:   05/22/18 0553 05/22/18 0919  BP: 139/78 139/81  Pulse: (!) 58 68  Resp: 16 11  Temp: 36.9 C 37.1 C  SpO2: 96% 100%    Last Pain:  Vitals:   05/22/18 0919  TempSrc:   PainSc: 0-No pain                 Haddy Mullinax A.

## 2018-05-22 NOTE — Anesthesia Procedure Notes (Signed)
Anesthesia Regional Block: Adductor canal block   Pre-Anesthetic Checklist: ,, timeout performed, Correct Patient, Correct Site, Correct Laterality, Correct Procedure, Correct Position, site marked, Risks and benefits discussed,  Surgical consent,  Pre-op evaluation,  At surgeon's request and post-op pain management  Laterality: Left  Prep: chloraprep       Needles:  Injection technique: Single-shot  Needle Type: Echogenic Stimulator Needle     Needle Length: 9cm  Needle Gauge: 21   Needle insertion depth: 7 cm   Additional Needles:   Procedures:,,,, ultrasound used (permanent image in chart),,,,  Narrative:  Start time: 05/22/2018 7:00 AM End time: 05/22/2018 7:05 AM Injection made incrementally with aspirations every 5 mL.  Performed by: Personally  Anesthesiologist: Josephine Igo, MD  Additional Notes: Timeout performed. Patient sedated. Relevant anatomy ID'd using [place]. Incremental 2-56ml injection of LA with frequent aspiration. Patient tolerated procedure well.\        Left Adductor Canal Block

## 2018-05-22 NOTE — Evaluation (Signed)
Physical Therapy Evaluation Patient Details Name: Keith [name]. MRN: [id number] DOB: 1945/09/27 Today's Date: 05/22/2018   History of Present Illness  Pt is a 72 YO male s/p L TKR on 05/22/18. PMH includes depression, BPH, DMII, DJD, HLD, migraines, anxiety, sleep apnea. Past surgical history includes cardiac cath 2015, cataract extraction, L knee arthroscopy x2, L cardiac cath with coronary angiogram, R TKR 2012, resection of prostate 2014.  Clinical Impression   Pt s/p L TKR on 9/10. PMH as listed above. Pt presents with difficulty performing bed mobility/transfers/ambulation, decreased activity tolerance for ambulation, and L knee pain. Pt to benefit from acute PT to address deficits. Pt ambulated in hallway with min guard assist. PT to continue to follow and progress mobility as able.     Follow Up Recommendations Follow surgeon's recommendation for DC plan and follow-up therapies;Supervision for mobility/OOB(OPPT )    Equipment Recommendations  None recommended by PT    Recommendations for Other Services       Precautions / Restrictions Precautions Precautions: Fall;Knee Restrictions Weight Bearing Restrictions: No Other Position/Activity Restrictions: WBAT       Mobility  Bed Mobility Overal bed mobility: Needs Assistance Bed Mobility: Supine to Sit     Supine to sit: Min guard;HOB elevated     General bed mobility comments: Min guard for safety. Increased time and effort to perform. Pt used UEs to lift LLE to EOB.   Transfers Overall transfer level: Needs assistance Equipment used: Rolling walker (2 wheeled) Transfers: Sit to/from Stand Sit to Stand: Min guard;From elevated surface         General transfer comment: Min guard for safety. Verbal cuing for hand placement. Pt self-steadied upon standing. Upon standing, pt noted to have bowel movement on bed and gown. Sit to stand x2, once from bed and once from toilet.   Ambulation/Gait Ambulation/Gait  assistance: Min guard Gait Distance (Feet): 70 Feet(10 ft, 60 ft ) Assistive device: Rolling walker (2 wheeled) Gait Pattern/deviations: Step-to pattern;Step-through pattern;Decreased stance time - left;Decreased weight shift to left;Trunk flexed Gait velocity: decr    General Gait Details: Min guard for safety, no evident of LLE buckling. Pt ambulated to toilet post-BM, then ambulated in hall. Verbal cuing for placement in RW, sequencing.   Stairs            Wheelchair Mobility    Modified Rankin (Stroke Patients Only)       Balance Overall balance assessment: Mild deficits observed, not formally tested                                           Pertinent Vitals/Pain Pain Assessment: 0-10 Pain Score: 6  Pain Location: L knee  Pain Descriptors / Indicators: Sharp Pain Intervention(s): Limited activity within patient's tolerance;Monitored during session;Repositioned    Home Living Family/patient expects to be discharged to:: Private residence Living Arrangements: Spouse/significant other Available Help at Discharge: Family;Available PRN/intermittently(wife with history of spinal fusions, uses rollator for ambulation. ) Type of Home: Other(Comment)(condo) Home Access: Stairs to enter Entrance Stairs-Rails: None Entrance Stairs-Number of Steps: 3 Home Layout: Two level;Able to live on main level with bedroom/bathroom Home Equipment: Shower seat - built in;Hand held Tourist information centre manager - 2 wheels;Cane - single point;Crutches;Walker - 4 wheels      Prior Function Level of Independence: Independent  Hand Dominance   Dominant Hand: Right    Extremity/Trunk Assessment   Upper Extremity Assessment Upper Extremity Assessment: Overall WFL for tasks assessed    Lower Extremity Assessment Lower Extremity Assessment: LLE deficits/detail LLE Deficits / Details: suspected post-surgical LLE weakness; able to perform quad sets x5, ankle  pumps x10, SLR x2.  LLE Sensation: WNL    Cervical / Trunk Assessment Cervical / Trunk Assessment: Normal  Communication   Communication: No difficulties  Cognition Arousal/Alertness: Awake/alert Behavior During Therapy: WFL for tasks assessed/performed Overall Cognitive Status: Within Functional Limits for tasks assessed                                        General Comments      Exercises Total Joint Exercises Ankle Circles/Pumps: AROM;Both;10 reps;Seated Quad Sets: AROM;Left;5 reps;Seated Heel Slides: AAROM;Left;5 reps;Supine Straight Leg Raises: AAROM;Left;Seated(2 repetitions )   Assessment/Plan    PT Assessment Patient needs continued PT services  PT Problem List Decreased strength;Pain;Decreased range of motion;Decreased activity tolerance;Decreased knowledge of use of DME;Decreased balance;Decreased mobility       PT Treatment Interventions DME instruction;Therapeutic activities;Gait training;Therapeutic exercise;Patient/family education;Stair training;Balance training;Functional mobility training    PT Goals (Current goals can be found in the Care Plan section)  Acute Rehab PT Goals PT Goal Formulation: With patient Time For Goal Achievement: 06/05/18 Potential to Achieve Goals: Good    Frequency 7X/week   Barriers to discharge        Co-evaluation               AM-PAC PT "6 Clicks" Daily Activity  Outcome Measure Difficulty turning over in bed (including adjusting bedclothes, sheets and blankets)?: Unable Difficulty moving from lying on back to sitting on the side of the bed? : Unable Difficulty sitting down on and standing up from a chair with arms (e.g., wheelchair, bedside commode, etc,.)?: Unable Help needed moving to and from a bed to chair (including a wheelchair)?: A Little Help needed walking in hospital room?: A Little Help needed climbing 3-5 steps with a railing? : A Little 6 Click Score: 12    End of Session  Equipment Utilized During Treatment: Gait belt Activity Tolerance: Patient tolerated treatment well;No increased pain Patient left: in chair;with chair alarm set;with SCD's reapplied;with call bell/phone within reach Nurse Communication: Mobility status PT Visit Diagnosis: Other abnormalities of gait and mobility (R26.89);Difficulty in walking, not elsewhere classified (R26.2)    Time: 7062-3762 PT Time Calculation (min) (ACUTE ONLY): 34 min   Charges:   PT Evaluation $PT Eval Low Complexity: 1 Low PT Treatments $Gait Training: 8-22 mins       Amear Strojny Conception Chancy, PT, DPT  Pager # [phone]    Lexton Hidalgo D Allecia Bells 05/22/2018, 4:46 PM

## 2018-05-22 NOTE — Transfer of Care (Signed)
Immediate Anesthesia Transfer of Care Note  Patient: Keith [name].  Procedure(s) Performed: Procedure(s): LEFT TOTAL KNEE ARTHROPLASTY (Left)  Patient Location: PACU  Anesthesia Type:MAC, Regional and Spinal  Level of Consciousness: Patient easily awoken, sedated, comfortable, cooperative, following commands, responds to stimulation.   Airway & Oxygen Therapy: Patient spontaneously breathing, ventilating well, oxygen via simple oxygen mask.  Post-op Assessment: Report given to PACU RN, vital signs reviewed and stable.   Post vital signs: Reviewed and stable.  Complications: No apparent anesthesia complications Last Vitals:  Vitals Value Taken Time  BP 139/81 05/22/2018  9:19 AM  Temp    Pulse 66 05/22/2018  9:21 AM  Resp 14 05/22/2018  9:21 AM  SpO2 99 % 05/22/2018  9:21 AM  Vitals shown include unvalidated device data.  Last Pain:  Vitals:   05/22/18 0553  TempSrc: Oral      Patients Stated Pain Goal: 4 (78/67/54 4920)  Complications: No apparent anesthesia complications

## 2018-05-22 NOTE — Discharge Instructions (Signed)

## 2018-05-22 NOTE — Anesthesia Procedure Notes (Signed)
Spinal  Patient location during procedure: OR Start time: 05/22/2018 7:31 AM End time: 05/22/2018 7:35 AM Staffing Anesthesiologist: Josephine Igo, MD Performed: anesthesiologist  Preanesthetic Checklist Completed: patient identified, site marked, surgical consent, pre-op evaluation, timeout performed, IV checked, risks and benefits discussed and monitors and equipment checked Spinal Block Patient position: sitting Prep: site prepped and draped and DuraPrep Patient monitoring: heart rate, cardiac monitor, continuous pulse ox and blood pressure Approach: midline Location: L3-4 Injection technique: single-shot Needle Needle type: Pencan  Needle gauge: 24 G Needle length: 9 cm Needle insertion depth: 7 cm Assessment Sensory level: T4 Additional Notes Patient tolerated procedure well. Adequate sensory level.

## 2018-05-22 NOTE — Anesthesia Procedure Notes (Signed)
Procedure Name: MAC Date/Time: 05/22/2018 7:31 AM Performed by: Deliah Boston, CRNA Pre-anesthesia Checklist: Patient identified, Emergency Drugs available, Suction available, Patient being monitored and Timeout performed Patient Re-evaluated:Patient Re-evaluated prior to induction Oxygen Delivery Method: Simple face mask Placement Confirmation: CO2 detector and breath sounds checked- equal and bilateral

## 2018-05-22 NOTE — Op Note (Signed)
NAME:  Keith [name].                      MEDICAL RECORD NO.:  [id number]                             FACILITY:  [facility]      PHYSICIAN:  Pietro Cassis. Alvan Dame, M.D.  DATE OF BIRTH:  Jul 22, 1946      DATE OF PROCEDURE:  05/22/2018                                     OPERATIVE REPORT         PREOPERATIVE DIAGNOSIS:  Left knee osteoarthritis.      POSTOPERATIVE DIAGNOSIS:  Left knee osteoarthritis.      FINDINGS:  The patient was noted to have complete loss of cartilage and   bone-on-bone arthritis with associated osteophytes in the medial and patellofemoral compartments of   the knee with a significant synovitis and associated effusion.  The patient had failed months of conservative treatment including medications, injection therapy, activity modification.     PROCEDURE:  Left total knee replacement.      COMPONENTS USED:  DePuy Attune rotating platform posterior stabilized knee   system, a size 6 femur, 7 tibia, size 8 mm PS AOX insert, and 41 anatomic patellar   button.      SURGEON:  Pietro Cassis. Alvan Dame, M.D.      ASSISTANT:  Danae Orleans, PA-C.      ANESTHESIA:  Regional and Spinal.      SPECIMENS:  None.      COMPLICATION:  None.      DRAINS:  None.  EBL: <150cc      TOURNIQUET TIME:   Total Tourniquet Time Documented: Thigh (Left) - 31 minutes Total: Thigh (Left) - 31 minutes  .      The patient was stable to the recovery room.      INDICATION FOR PROCEDURE:  Keith [name]. is a 72 y.o. male patient of   mine.  The patient had been seen, evaluated, and treated for months conservatively in the   office with medication, activity modification, and injections.  The patient had   radiographic changes of bone-on-bone arthritis with endplate sclerosis and osteophytes noted.  Based on the radiographic changes and failed conservative measures, the patient   decided to proceed with definitive treatment, total knee replacement.  Risks of infection, DVT, component failure,  need for revision surgery, neurovascular injury were reviewed in the office setting.  The postop course was reviewed stressing the efforts to maximize post-operative satisfaction and function.  Consent was obtained for benefit of pain   relief.      PROCEDURE IN DETAIL:  The patient was brought to the operative theater.   Once adequate anesthesia, preoperative antibiotics, 1 gm of Vancomycin, weight dosed Gentamycin, 1 gm of Tranexamic Acid, and 10 mg of Decadron administered, the patient was positioned supine with a left thigh tourniquet placed.  The  left lower extremity was prepped and draped in sterile fashion.  A time-   out was performed identifying the patient, planned procedure, and the appropriate extremity.      The left lower extremity was placed in the [facility] leg holder.  The leg was   exsanguinated, tourniquet elevated to 250 mmHg.  A  midline incision was   made followed by median parapatellar arthrotomy.  Following initial   exposure, attention was first directed to the patella.  Precut   measurement was noted to be 23 mm.  I resected down to 14 mm and used a   41 anatomic patellar button to restore patellar height as well as cover the cut surface.      The lug holes were drilled and a metal shim was placed to protect the   patella from retractors and saw blade during the procedure.      At this point, attention was now directed to the femur.  The femoral   canal was opened with a drill, irrigated to try to prevent fat emboli.  An   intramedullary rod was passed at 5 degrees valgus, 9 mm of bone was   resected off the distal femur.  Following this resection, the tibia was   subluxated anteriorly.  Using the extramedullary guide, 2 mm of bone was resected off   the proximal medial tibia.  We confirmed the gap would be   stable medially and laterally with a size 6 spacer block as well as confirmed that the tibial cut was perpendicular in the coronal plane, checking with an  alignment rod.      Once this was done, I sized the femur to be a size 6 in the anterior-   posterior dimension, chose a standard component based on medial and   lateral dimension.  The size 6 rotation block was then pinned in   position anterior referenced using the C-clamp to set rotation.  The   anterior, posterior, and  chamfer cuts were made without difficulty nor   notching making certain that I was along the anterior cortex to help   with flexion gap stability.      The final box cut was made off the lateral aspect of distal femur.      At this point, the tibia was sized to be a size 7.  The size 7 tray was   then pinned in position through the medial third of the tubercle,   drilled, and keel punched.  Trial reduction was now carried with a 6 femur,  7 tibia, a size 7 then 8 mm PS insert, and the 41 anatomic patella botton.  The knee was brought to full extension with good flexion stability with the patella   tracking through the trochlea without application of pressure.  Given   all these findings the trial components removed.  Final components were   opened and cement was mixed.  The knee was irrigated with normal saline solution and pulse lavage.  The synovial lining was   then injected with 30 cc of 0.25% Marcaine with epinephrine, 1 cc of Toradol and 30 cc of NS for a total of 61 cc.     Final implants were then cemented onto cleaned and dried cut surfaces of bone with the knee brought to extension with a size 8 mm PS trial insert.      Once the cement had fully cured, excess cement was removed   throughout the knee.  I confirmed that I was satisfied with the range of   motion and stability, and the final size 8 mm PS AOX insert was chosen.  It was   placed into the knee.      The tourniquet had been let down at 31 minutes.  No significant   hemostasis was required.  The extensor  mechanism was then reapproximated using #1 Vicryl and #1 Stratafix sutures with the knee   in  flexion.  The   remaining wound was closed with 2-0 Vicryl and running 4-0 Monocryl.   The knee was cleaned, dried, dressed sterilely using Dermabond and   Aquacel dressing.  The patient was then   brought to recovery room in stable condition, tolerating the procedure   well.   Please note that Physician Assistant, Danae Orleans, PA-C was present for the entirety of the case, and was utilized for pre-operative positioning, peri-operative retractor management, general facilitation of the procedure and for primary wound closure at the end of the case.              Pietro Cassis Alvan Dame, M.D.    05/22/2018 8:51 AM

## 2018-05-23 ENCOUNTER — Encounter (HOSPITAL_COMMUNITY): Payer: Self-pay | Admitting: Orthopedic Surgery

## 2018-05-23 DIAGNOSIS — N138 Other obstructive and reflux uropathy: Secondary | ICD-10-CM | POA: Diagnosis not present

## 2018-05-23 DIAGNOSIS — F329 Major depressive disorder, single episode, unspecified: Secondary | ICD-10-CM | POA: Diagnosis not present

## 2018-05-23 DIAGNOSIS — E669 Obesity, unspecified: Secondary | ICD-10-CM | POA: Diagnosis present

## 2018-05-23 DIAGNOSIS — N401 Enlarged prostate with lower urinary tract symptoms: Secondary | ICD-10-CM | POA: Diagnosis not present

## 2018-05-23 DIAGNOSIS — E785 Hyperlipidemia, unspecified: Secondary | ICD-10-CM | POA: Diagnosis not present

## 2018-05-23 DIAGNOSIS — F419 Anxiety disorder, unspecified: Secondary | ICD-10-CM | POA: Diagnosis not present

## 2018-05-23 DIAGNOSIS — M1712 Unilateral primary osteoarthritis, left knee: Secondary | ICD-10-CM | POA: Diagnosis not present

## 2018-05-23 LAB — CBC
HCT: 32.5 % — ABNORMAL LOW (ref 39.0–52.0)
Hemoglobin: 10.8 g/dL — ABNORMAL LOW (ref 13.0–17.0)
MCH: 30.6 pg (ref 26.0–34.0)
MCHC: 33.2 g/dL (ref 30.0–36.0)
MCV: 92.1 fL (ref 78.0–100.0)
Platelets: 218 10*3/uL (ref 150–400)
RBC: 3.53 MIL/uL — ABNORMAL LOW (ref 4.22–5.81)
RDW: 13.6 % (ref 11.5–15.5)
WBC: 15.2 10*3/uL — ABNORMAL HIGH (ref 4.0–10.5)

## 2018-05-23 LAB — BASIC METABOLIC PANEL
Anion gap: 9 (ref 5–15)
BUN: 37 mg/dL — ABNORMAL HIGH (ref 8–23)
CO2: 22 mmol/L (ref 22–32)
Calcium: 8.4 mg/dL — ABNORMAL LOW (ref 8.9–10.3)
Chloride: 108 mmol/L (ref 98–111)
Creatinine, Ser: 1.97 mg/dL — ABNORMAL HIGH (ref 0.61–1.24)
GFR calc Af Amer: 38 mL/min — ABNORMAL LOW (ref >60)
GFR calc non Af Amer: 32 mL/min — ABNORMAL LOW (ref >60)
Glucose, Bld: 116 mg/dL — ABNORMAL HIGH (ref 70–99)
Potassium: 4.2 mmol/L (ref 3.5–5.1)
Sodium: 139 mmol/L (ref 135–145)

## 2018-05-23 NOTE — Plan of Care (Signed)
Pt is stable. Pain medication in progress, effective.

## 2018-05-23 NOTE — Addendum Note (Signed)
Addendum  created 05/23/18 0617 by Lollie Sails, CRNA   Charge Capture section accepted

## 2018-05-23 NOTE — Progress Notes (Addendum)
     Subjective: 1 Day Post-Op Procedure(s) (LRB): LEFT TOTAL KNEE ARTHROPLASTY (Left)   Patient reports pain as mild, pain controlled. No events throughout the night. Feels that he did well with PT yesterday.  Ready to be discharged home.    Patient's anticipated LOS is less than 2 midnights, meeting these requirements: - Lives within 1 hour of care - Has a competent adult at home to recover with post-op recover - NO history of  - Chronic pain requiring opiods  - Coronary Artery Disease  - Heart failure  - Heart attack  - Stroke  - DVT/VTE  - Cardiac arrhythmia  - Respiratory Failure/COPD  - Renal failure  - Anemia  - Advanced Liver disease    Objective:   VITALS:   Vitals:   05/23/18 0210 05/23/18 0524  BP: 131/74 133/67  Pulse: 75 64  Resp: 17 16  Temp: 98.2 F (36.8 C) 98.2 F (36.8 C)  SpO2: 95% 98%    Dorsiflexion/Plantar flexion intact Incision: dressing C/D/I No cellulitis present Compartment soft  LABS Recent Labs    05/23/18 0507  HGB 10.8*  HCT 32.5*  WBC 15.2*  PLT 218    Recent Labs    05/23/18 0507  NA 139  K 4.2  BUN 37*  CREATININE 1.97*  GLUCOSE 116*     Assessment/Plan: 1 Day Post-Op Procedure(s) (LRB): LEFT TOTAL KNEE ARTHROPLASTY (Left) Foley cath d/c'ed Advance diet Up with therapy D/C IV fluids Discharge home Follow up in 2 weeks at [facility] Follow up with OLIN,Courtenay Hirth D in 2 weeks.  Contact information:  [facility]) [address] [place] [phone]    Obese (BMI 30-39.9) Estimated body mass index is 34.04 kg/m as calculated from the following:   Height as of this encounter: 6' (1.829 m).   Weight as of this encounter: 113.9 kg. Patient also counseled that weight may inhibit the healing process Patient counseled that losing weight will help with future health issues        Keith [name]    PAC  05/23/2018, 8:09 AM

## 2018-05-23 NOTE — Progress Notes (Signed)
Physical Therapy Treatment Patient Details Name: Keith [name]. MRN: [id number] DOB: [birth year] Today's Date: 05/23/2018    History of Present Illness Pt is a 72 YO male s/p L TKR on 05/22/18. PMH includes depression, BPH, DMII, DJD, HLD, migraines, anxiety, sleep apnea. Past surgical history includes cardiac cath 2015, cataract extraction, L knee arthroscopy x2, L cardiac cath with coronary angiogram, R TKR 2012, resection of prostate 2014.    PT Comments    Pt ambulated in hallway and practiced safe stair technique.  Pt also performed LE exercise and provided with HEP handout.  Pt ready for d/c home and plans to f/u with OP PT on Friday.  Follow Up Recommendations  Follow surgeon's recommendation for DC plan and follow-up therapies(plans for OP PT)     Equipment Recommendations  None recommended by PT    Recommendations for Other Services       Precautions / Restrictions Precautions Precautions: Fall;Knee Restrictions Other Position/Activity Restrictions: WBAT     Mobility  Bed Mobility Overal bed mobility: Needs Assistance Bed Mobility: Supine to Sit     Supine to sit: HOB elevated;Supervision     General bed mobility comments: Pt used UEs to lift LLE to EOB.   Transfers Overall transfer level: Needs assistance Equipment used: Rolling walker (2 wheeled) Transfers: Sit to/from Stand Sit to Stand: Min guard;From elevated surface         General transfer comment: verbal cues for UE and LE positioning, no physical assist required  Ambulation/Gait Ambulation/Gait assistance: Min guard Gait Distance (Feet): 160 Feet Assistive device: Rolling walker (2 wheeled) Gait Pattern/deviations: Step-through pattern;Decreased stance time - left;Antalgic     General Gait Details: verbal cues for sequence, RW positioning, step length, posture   Stairs Stairs: Yes   Stair Management: Step to pattern;Forwards;One rail Left Number of Stairs: 3 General stair comments:  pt has left hand rail in garage so practiced with one rail with both hands on rail, cues for safety and sequence, pt performed with ease; reports understanding   Wheelchair Mobility    Modified Rankin (Stroke Patients Only)       Balance                                            Cognition Arousal/Alertness: Awake/alert Behavior During Therapy: WFL for tasks assessed/performed Overall Cognitive Status: Within Functional Limits for tasks assessed                                        Exercises Total Joint Exercises Ankle Circles/Pumps: AROM;Both;10 reps;Seated Quad Sets: AROM;Left;10 reps Short Arc Quad: 10 reps;AAROM;Left Heel Slides: AAROM;Left;10 reps;Seated Hip ABduction/ADduction: AROM;10 reps;Left Straight Leg Raises: AAROM;Left;Supine;10 reps Goniometric ROM: approx 50* AAROM L knee flexion sitting edge of chair    General Comments        Pertinent Vitals/Pain Pain Assessment: 0-10 Pain Score: 4  Pain Location: L knee  Pain Descriptors / Indicators: Sore Pain Intervention(s): Limited activity within patient's tolerance;Monitored during session;Premedicated before session;Repositioned    Home Living                      Prior Function            PT Goals (current goals can now be found in  the care plan section) Progress towards PT goals: Progressing toward goals    Frequency    7X/week      PT Plan Current plan remains appropriate    Co-evaluation              AM-PAC PT "6 Clicks" Daily Activity  Outcome Measure  Difficulty turning over in bed (including adjusting bedclothes, sheets and blankets)?: A Little Difficulty moving from lying on back to sitting on the side of the bed? : A Little Difficulty sitting down on and standing up from a chair with arms (e.g., wheelchair, bedside commode, etc,.)?: A Little Help needed moving to and from a bed to chair (including a wheelchair)?: A Little Help  needed walking in hospital room?: A Little Help needed climbing 3-5 steps with a railing? : A Little 6 Click Score: 18    End of Session Equipment Utilized During Treatment: Gait belt Activity Tolerance: Patient tolerated treatment well;No increased pain Patient left: in chair;with call bell/phone within reach Nurse Communication: Mobility status PT Visit Diagnosis: Other abnormalities of gait and mobility (R26.89);Difficulty in walking, not elsewhere classified (R26.2)     Time: 2197-5883 PT Time Calculation (min) (ACUTE ONLY): 28 min  Charges:  $Gait Training: 8-22 mins $Therapeutic Exercise: 8-22 mins                    Carmelia Bake, PT, DPT Acute Rehabilitation Services Office: [phone] Pager: [phone]  Trena Platt 05/23/2018, 1:30 PM

## 2018-05-25 DIAGNOSIS — M1712 Unilateral primary osteoarthritis, left knee: Secondary | ICD-10-CM | POA: Diagnosis not present

## 2018-05-25 DIAGNOSIS — M6281 Muscle weakness (generalized): Secondary | ICD-10-CM | POA: Diagnosis not present

## 2018-05-25 DIAGNOSIS — M25562 Pain in left knee: Secondary | ICD-10-CM | POA: Diagnosis not present

## 2018-05-25 DIAGNOSIS — Z96652 Presence of left artificial knee joint: Secondary | ICD-10-CM | POA: Diagnosis not present

## 2018-05-26 LAB — TYPE AND SCREEN
ABO/RH(D): A POS
Antibody Screen: NEGATIVE
Unit division: 0
Unit division: 0

## 2018-05-26 LAB — BPAM RBC
Blood Product Expiration Date: 201910022359
Blood Product Expiration Date: 201910032359
Unit Type and Rh: 6200
Unit Type and Rh: 6200

## 2018-05-29 NOTE — Discharge Summary (Signed)
Physician Discharge Summary  Patient ID: Keith [name]. MRN: [id number] DOB/AGE: 71-May-1947 71 y.o.  Admit date: 05/22/2018 Discharge date: 05/23/2018   Procedures:  Procedure(s) (LRB): LEFT TOTAL KNEE ARTHROPLASTY (Left)  Attending Physician:  Dr. Paralee Cancel   Admission Diagnoses:   Left knee primary OA / pain  Discharge Diagnoses:  Principal Problem:   S/P left TKA Active Problems:   Obese  Past Medical History:  Diagnosis Date  . Anginal pain ([place])   . Anxiety    takes Celexa  . Arthritis    bil knees  . Benign prostatic hypertrophy    takes Flomax daily  . Bladder stones 06/25/2013   1.9cm bladder stone.  Marland Kitchen BPH with urinary obstruction 06/25/2013   127cc prostate with large middle lobe and bladder stones.   . Complication of anesthesia    hard to wake up  . Depression   . Diabetes mellitus without complication ([place])   . ED (erectile dysfunction)   . Hyperlipidemia   . Hypertension   . PONV (postoperative nausea and vomiting)   . Sleep apnea    uses bipap machine  . Spinal headache    reports last spinal headache was yesterday 05-16-18, lasted for 2 hours a, relief with tylenol , denies vision change with yesterdays occurrence but wife endorses vision changes with past spinal headaches    . Type 2 DM with CKD stage 3 and hypertension ([facility])     HPI:    Keith [name]., 72 y.o. male, has a history of pain and functional disability in the left knee due to arthritis and has failed non-surgical conservative treatments for greater than 12 weeks to include NSAID's and/or analgesics, corticosteriod injections and activity modification.  Onset of symptoms was gradual, starting 4 years ago with gradually worsening course since that time. The patient noted prior procedures on the knee to include  arthroscopy and menisectomy on the left knee(s).  Patient currently rates pain in the left knee(s) at 6 out of 10 with activity. Patient has worsening of pain with activity  and weight bearing, pain that interferes with activities of daily living, pain with passive range of motion, crepitus and joint swelling.  Patient has evidence of periarticular osteophytes and joint space narrowing by imaging studies.  There is no active infection.  Risks, benefits and expectations were discussed with the patient.  Risks including but not limited to the risk of anesthesia, blood clots, nerve damage, blood vessel damage, failure of the prosthesis, infection and up to and including death.  Patient understand the risks, benefits and expectations and wishes to proceed with surgery.   PCP: Leanna Battles, MD   Discharged Condition: good  Hospital Course:  Patient underwent the above stated procedure on 05/22/2018. Patient tolerated the procedure well and brought to the recovery room in good condition and subsequently to the floor.  POD #1 BP: 133/67 ; Pulse: 64 ; Temp: 98.2 F (36.8 C) ; Resp: 16 Patient reports pain as mild, pain controlled. No events throughout the night. Feels that he did well with PT yesterday.  Ready to be discharged home.  Dorsiflexion/plantar flexion intact, incision: dressing C/D/I, no cellulitis present and compartment soft.   LABS  Basename    HGB     10.8  HCT     32.5    Discharge Exam: General appearance: alert, cooperative and no distress Extremities: Homans sign is negative, no sign of DVT, no edema, redness or tenderness in the calves or  thighs and no ulcers, gangrene or trophic changes  Disposition:  Home with follow up in 2 weeks   Follow-up Information    Paralee Cancel, MD. Schedule an appointment as soon as possible for a visit in 2 weeks.   Specialty:  Orthopedic Surgery Contact information: [address] [place] [address] [phone]           Discharge Instructions    Call MD / Call 911   Complete by:  As directed    If you experience chest pain or shortness of breath, CALL 911 and be transported to  the hospital emergency room.  If you develope a fever above 101 F, pus (white drainage) or increased drainage or redness at the wound, or calf pain, call your surgeon's office.   Change dressing   Complete by:  As directed    Maintain surgical dressing until follow up in the clinic. If the edges start to pull up, may reinforce with tape. If the dressing is no longer working, may remove and cover with gauze and tape, but must keep the area dry and clean.  Call with any questions or concerns.   Constipation Prevention   Complete by:  As directed    Drink plenty of fluids.  Prune juice may be helpful.  You may use a stool softener, such as Colace (over the counter) 100 mg twice a day.  Use MiraLax (over the counter) for constipation as needed.   Diet - low sodium heart healthy   Complete by:  As directed    Discharge instructions   Complete by:  As directed    Maintain surgical dressing until follow up in the clinic. If the edges start to pull up, may reinforce with tape. If the dressing is no longer working, may remove and cover with gauze and tape, but must keep the area dry and clean.  Follow up in 2 weeks at [facility]. Call with any questions or concerns.   Increase activity slowly as tolerated   Complete by:  As directed    Weight bearing as tolerated with assist device (walker, cane, etc) as directed, use it as long as suggested by your surgeon or therapist, typically at least 4-6 weeks.   TED hose   Complete by:  As directed    Use stockings (TED hose) for 2 weeks on both leg(s).  You may remove them at night for sleeping.        Signed: [place] P[place]. [place]ana Munford   PA-C  05/29/2018, 9:56 AM

## 2018-05-30 DIAGNOSIS — M1712 Unilateral primary osteoarthritis, left knee: Secondary | ICD-10-CM | POA: Diagnosis not present

## 2018-05-30 DIAGNOSIS — Z96652 Presence of left artificial knee joint: Secondary | ICD-10-CM | POA: Diagnosis not present

## 2018-05-30 DIAGNOSIS — M6281 Muscle weakness (generalized): Secondary | ICD-10-CM | POA: Diagnosis not present

## 2018-05-30 DIAGNOSIS — M25562 Pain in left knee: Secondary | ICD-10-CM | POA: Diagnosis not present

## 2018-06-04 DIAGNOSIS — Z96652 Presence of left artificial knee joint: Secondary | ICD-10-CM | POA: Diagnosis not present

## 2018-06-04 DIAGNOSIS — M6281 Muscle weakness (generalized): Secondary | ICD-10-CM | POA: Diagnosis not present

## 2018-06-04 DIAGNOSIS — M25562 Pain in left knee: Secondary | ICD-10-CM | POA: Diagnosis not present

## 2018-06-04 DIAGNOSIS — M1712 Unilateral primary osteoarthritis, left knee: Secondary | ICD-10-CM | POA: Diagnosis not present

## 2018-06-08 DIAGNOSIS — Z96652 Presence of left artificial knee joint: Secondary | ICD-10-CM | POA: Diagnosis not present

## 2018-06-08 DIAGNOSIS — M1712 Unilateral primary osteoarthritis, left knee: Secondary | ICD-10-CM | POA: Diagnosis not present

## 2018-06-08 DIAGNOSIS — M6281 Muscle weakness (generalized): Secondary | ICD-10-CM | POA: Diagnosis not present

## 2018-06-08 DIAGNOSIS — M25562 Pain in left knee: Secondary | ICD-10-CM | POA: Diagnosis not present

## 2018-06-11 DIAGNOSIS — M1712 Unilateral primary osteoarthritis, left knee: Secondary | ICD-10-CM | POA: Diagnosis not present

## 2018-06-11 DIAGNOSIS — M6281 Muscle weakness (generalized): Secondary | ICD-10-CM | POA: Diagnosis not present

## 2018-06-11 DIAGNOSIS — M25562 Pain in left knee: Secondary | ICD-10-CM | POA: Diagnosis not present

## 2018-06-11 DIAGNOSIS — Z96652 Presence of left artificial knee joint: Secondary | ICD-10-CM | POA: Diagnosis not present

## 2018-06-13 DIAGNOSIS — M25562 Pain in left knee: Secondary | ICD-10-CM | POA: Diagnosis not present

## 2018-06-13 DIAGNOSIS — E7849 Other hyperlipidemia: Secondary | ICD-10-CM | POA: Diagnosis not present

## 2018-06-13 DIAGNOSIS — Z96652 Presence of left artificial knee joint: Secondary | ICD-10-CM | POA: Diagnosis not present

## 2018-06-13 DIAGNOSIS — M6281 Muscle weakness (generalized): Secondary | ICD-10-CM | POA: Diagnosis not present

## 2018-06-13 DIAGNOSIS — I10 Essential (primary) hypertension: Secondary | ICD-10-CM | POA: Diagnosis not present

## 2018-06-13 DIAGNOSIS — Z125 Encounter for screening for malignant neoplasm of prostate: Secondary | ICD-10-CM | POA: Diagnosis not present

## 2018-06-13 DIAGNOSIS — E1129 Type 2 diabetes mellitus with other diabetic kidney complication: Secondary | ICD-10-CM | POA: Diagnosis not present

## 2018-06-13 DIAGNOSIS — M1712 Unilateral primary osteoarthritis, left knee: Secondary | ICD-10-CM | POA: Diagnosis not present

## 2018-06-19 DIAGNOSIS — M25562 Pain in left knee: Secondary | ICD-10-CM | POA: Diagnosis not present

## 2018-06-19 DIAGNOSIS — Z96652 Presence of left artificial knee joint: Secondary | ICD-10-CM | POA: Diagnosis not present

## 2018-06-19 DIAGNOSIS — M6281 Muscle weakness (generalized): Secondary | ICD-10-CM | POA: Diagnosis not present

## 2018-06-19 DIAGNOSIS — M1712 Unilateral primary osteoarthritis, left knee: Secondary | ICD-10-CM | POA: Diagnosis not present

## 2018-06-21 DIAGNOSIS — R82998 Other abnormal findings in urine: Secondary | ICD-10-CM | POA: Diagnosis not present

## 2018-06-21 DIAGNOSIS — I2510 Atherosclerotic heart disease of native coronary artery without angina pectoris: Secondary | ICD-10-CM | POA: Diagnosis not present

## 2018-06-21 DIAGNOSIS — M25562 Pain in left knee: Secondary | ICD-10-CM | POA: Diagnosis not present

## 2018-06-21 DIAGNOSIS — Z6833 Body mass index (BMI) 33.0-33.9, adult: Secondary | ICD-10-CM | POA: Diagnosis not present

## 2018-06-21 DIAGNOSIS — M545 Low back pain: Secondary | ICD-10-CM | POA: Diagnosis not present

## 2018-06-21 DIAGNOSIS — N3281 Overactive bladder: Secondary | ICD-10-CM | POA: Diagnosis not present

## 2018-06-21 DIAGNOSIS — Z23 Encounter for immunization: Secondary | ICD-10-CM | POA: Diagnosis not present

## 2018-06-21 DIAGNOSIS — Z0000 Encounter for general adult medical examination without abnormal findings: Secondary | ICD-10-CM | POA: Diagnosis not present

## 2018-06-21 DIAGNOSIS — N183 Chronic kidney disease, stage 3 (moderate): Secondary | ICD-10-CM | POA: Diagnosis not present

## 2018-06-21 DIAGNOSIS — G4733 Obstructive sleep apnea (adult) (pediatric): Secondary | ICD-10-CM | POA: Diagnosis not present

## 2018-06-21 DIAGNOSIS — Z1389 Encounter for screening for other disorder: Secondary | ICD-10-CM | POA: Diagnosis not present

## 2018-06-21 DIAGNOSIS — J3089 Other allergic rhinitis: Secondary | ICD-10-CM | POA: Diagnosis not present

## 2018-06-21 DIAGNOSIS — E1129 Type 2 diabetes mellitus with other diabetic kidney complication: Secondary | ICD-10-CM | POA: Diagnosis not present

## 2018-06-21 DIAGNOSIS — I7389 Other specified peripheral vascular diseases: Secondary | ICD-10-CM | POA: Diagnosis not present

## 2018-06-22 DIAGNOSIS — M6281 Muscle weakness (generalized): Secondary | ICD-10-CM | POA: Diagnosis not present

## 2018-06-22 DIAGNOSIS — M1712 Unilateral primary osteoarthritis, left knee: Secondary | ICD-10-CM | POA: Diagnosis not present

## 2018-06-22 DIAGNOSIS — Z96652 Presence of left artificial knee joint: Secondary | ICD-10-CM | POA: Diagnosis not present

## 2018-06-22 DIAGNOSIS — Z1212 Encounter for screening for malignant neoplasm of rectum: Secondary | ICD-10-CM | POA: Diagnosis not present

## 2018-06-22 DIAGNOSIS — M25562 Pain in left knee: Secondary | ICD-10-CM | POA: Diagnosis not present

## 2018-06-25 DIAGNOSIS — M25562 Pain in left knee: Secondary | ICD-10-CM | POA: Diagnosis not present

## 2018-06-25 DIAGNOSIS — M6281 Muscle weakness (generalized): Secondary | ICD-10-CM | POA: Diagnosis not present

## 2018-06-25 DIAGNOSIS — M1712 Unilateral primary osteoarthritis, left knee: Secondary | ICD-10-CM | POA: Diagnosis not present

## 2018-06-25 DIAGNOSIS — Z96652 Presence of left artificial knee joint: Secondary | ICD-10-CM | POA: Diagnosis not present

## 2018-06-26 DIAGNOSIS — N183 Chronic kidney disease, stage 3 (moderate): Secondary | ICD-10-CM | POA: Diagnosis not present

## 2018-06-26 DIAGNOSIS — E1122 Type 2 diabetes mellitus with diabetic chronic kidney disease: Secondary | ICD-10-CM | POA: Diagnosis not present

## 2018-06-26 DIAGNOSIS — N200 Calculus of kidney: Secondary | ICD-10-CM | POA: Diagnosis not present

## 2018-06-26 DIAGNOSIS — N179 Acute kidney failure, unspecified: Secondary | ICD-10-CM | POA: Diagnosis not present

## 2018-06-26 DIAGNOSIS — I129 Hypertensive chronic kidney disease with stage 1 through stage 4 chronic kidney disease, or unspecified chronic kidney disease: Secondary | ICD-10-CM | POA: Diagnosis not present

## 2018-06-27 DIAGNOSIS — Z96652 Presence of left artificial knee joint: Secondary | ICD-10-CM | POA: Diagnosis not present

## 2018-06-27 DIAGNOSIS — M25562 Pain in left knee: Secondary | ICD-10-CM | POA: Diagnosis not present

## 2018-06-27 DIAGNOSIS — M1712 Unilateral primary osteoarthritis, left knee: Secondary | ICD-10-CM | POA: Diagnosis not present

## 2018-06-27 DIAGNOSIS — M6281 Muscle weakness (generalized): Secondary | ICD-10-CM | POA: Diagnosis not present

## 2018-07-02 DIAGNOSIS — M25562 Pain in left knee: Secondary | ICD-10-CM | POA: Diagnosis not present

## 2018-07-02 DIAGNOSIS — Z96652 Presence of left artificial knee joint: Secondary | ICD-10-CM | POA: Diagnosis not present

## 2018-07-02 DIAGNOSIS — M6281 Muscle weakness (generalized): Secondary | ICD-10-CM | POA: Diagnosis not present

## 2018-07-02 DIAGNOSIS — M1712 Unilateral primary osteoarthritis, left knee: Secondary | ICD-10-CM | POA: Diagnosis not present

## 2018-07-04 DIAGNOSIS — M1712 Unilateral primary osteoarthritis, left knee: Secondary | ICD-10-CM | POA: Diagnosis not present

## 2018-07-04 DIAGNOSIS — M6281 Muscle weakness (generalized): Secondary | ICD-10-CM | POA: Diagnosis not present

## 2018-07-04 DIAGNOSIS — M25562 Pain in left knee: Secondary | ICD-10-CM | POA: Diagnosis not present

## 2018-07-04 DIAGNOSIS — Z96652 Presence of left artificial knee joint: Secondary | ICD-10-CM | POA: Diagnosis not present

## 2018-07-05 DIAGNOSIS — Z96652 Presence of left artificial knee joint: Secondary | ICD-10-CM | POA: Diagnosis not present

## 2018-07-05 DIAGNOSIS — Z471 Aftercare following joint replacement surgery: Secondary | ICD-10-CM | POA: Diagnosis not present

## 2018-07-09 DIAGNOSIS — M6281 Muscle weakness (generalized): Secondary | ICD-10-CM | POA: Diagnosis not present

## 2018-07-09 DIAGNOSIS — M25562 Pain in left knee: Secondary | ICD-10-CM | POA: Diagnosis not present

## 2018-07-09 DIAGNOSIS — M1712 Unilateral primary osteoarthritis, left knee: Secondary | ICD-10-CM | POA: Diagnosis not present

## 2018-07-09 DIAGNOSIS — Z96652 Presence of left artificial knee joint: Secondary | ICD-10-CM | POA: Diagnosis not present

## 2018-07-13 ENCOUNTER — Ambulatory Visit (INDEPENDENT_AMBULATORY_CARE_PROVIDER_SITE_OTHER): Payer: Medicare Other | Admitting: Podiatry

## 2018-07-13 DIAGNOSIS — M79674 Pain in right toe(s): Secondary | ICD-10-CM | POA: Diagnosis not present

## 2018-07-13 DIAGNOSIS — B351 Tinea unguium: Secondary | ICD-10-CM | POA: Diagnosis not present

## 2018-07-13 DIAGNOSIS — M79675 Pain in left toe(s): Secondary | ICD-10-CM | POA: Diagnosis not present

## 2018-07-13 NOTE — Patient Instructions (Signed)

## 2018-07-16 DIAGNOSIS — M6281 Muscle weakness (generalized): Secondary | ICD-10-CM | POA: Diagnosis not present

## 2018-07-16 DIAGNOSIS — Z96652 Presence of left artificial knee joint: Secondary | ICD-10-CM | POA: Diagnosis not present

## 2018-07-16 DIAGNOSIS — M25562 Pain in left knee: Secondary | ICD-10-CM | POA: Diagnosis not present

## 2018-07-16 DIAGNOSIS — M1712 Unilateral primary osteoarthritis, left knee: Secondary | ICD-10-CM | POA: Diagnosis not present

## 2018-07-25 DIAGNOSIS — M6281 Muscle weakness (generalized): Secondary | ICD-10-CM | POA: Diagnosis not present

## 2018-07-25 DIAGNOSIS — M1712 Unilateral primary osteoarthritis, left knee: Secondary | ICD-10-CM | POA: Diagnosis not present

## 2018-07-25 DIAGNOSIS — Z96652 Presence of left artificial knee joint: Secondary | ICD-10-CM | POA: Diagnosis not present

## 2018-07-25 DIAGNOSIS — M25562 Pain in left knee: Secondary | ICD-10-CM | POA: Diagnosis not present

## 2018-08-01 ENCOUNTER — Encounter: Payer: Self-pay | Admitting: Podiatry

## 2018-08-01 NOTE — Progress Notes (Signed)
Subjective: Keith [name]. presents today with cc of painful, discolored, thick toenail right great toe which interferes with daily activities and routine tasks.  Pain is aggravated when wearing enclosed shoe gear. He has completed oral Lamisil therapy. He has completed his oral Lamisil therapy with no side effects. LFTs normal in August. Objective: Vascular Examination: Capillary refill time immediate x 10 digits Dorsalis pedis and posterior tibial pulses 2/4 b/l + Digital hair x 10 digits Skin temperature warm to warm b/l  Dermatological Examination: Pedal skin with normal turgor, texture and tone b/l Toenail right hallux discolored, thick, dystrophic with subungual debris distal 1/2 of nail plate.  Musculoskeletal: Muscle strength 5/5 to all LE muscle groups No gross pedal abnormalities  Neurological: Sensation intact with 10 gram monofilament. Vibratory sensation intact.  Assessment: Painful onychomycosis toenail right hallux with completed Lamisil therapy  Plan: 1. Toenail right great toe was debrided in length and girth without iatrogenic bleeding. We will check progress of Lamisil therapy in 3 months. 2. Patient to continue soft, supportive shoe gear 3. Patient to report any pedal injuries to medical professional immediately. 4. Follow up 3 months. Patient/POA to call should there be a concern in the interim.

## 2018-08-13 DIAGNOSIS — Z5189 Encounter for other specified aftercare: Secondary | ICD-10-CM | POA: Insufficient documentation

## 2018-08-21 ENCOUNTER — Encounter: Payer: Self-pay | Admitting: Podiatry

## 2018-08-21 ENCOUNTER — Ambulatory Visit (INDEPENDENT_AMBULATORY_CARE_PROVIDER_SITE_OTHER): Payer: Medicare Other | Admitting: Podiatry

## 2018-08-21 DIAGNOSIS — B351 Tinea unguium: Secondary | ICD-10-CM | POA: Diagnosis not present

## 2018-08-21 DIAGNOSIS — M79675 Pain in left toe(s): Secondary | ICD-10-CM

## 2018-08-21 DIAGNOSIS — M79674 Pain in right toe(s): Secondary | ICD-10-CM | POA: Diagnosis not present

## 2018-08-21 DIAGNOSIS — L603 Nail dystrophy: Secondary | ICD-10-CM

## 2018-08-21 MED ORDER — TERBINAFINE HCL 250 MG PO TABS: 250.0000 mg | ORAL_TABLET | Freq: Every day | ORAL | 0 refills | Status: DC

## 2018-08-21 NOTE — Patient Instructions (Signed)
Dr. Hyatt has sent over a refill for Lamisil to your pharmacy today. The instructions on your bottle will say "take 1 tablet daily", however, he would like for you to take one pill every other day. He will follow up with you in 3 months to re-evaluate your toenails. 

## 2018-08-21 NOTE — Progress Notes (Signed)
He presents today states that he thinks that the Lamisil seems to be helping states that he still has a few left.  His toenails are long and painful.  Objective: Vital signs are stable he is alert and oriented x3.  There is no erythema edema cellulitis drainage or odor his toenails are long thick yellow dystrophic and the mycosis appears to be resolving he only has about 25% left to the distal medial aspect of the right hallux nail plate.  Assessment: Pain in limb secondary to onychomycosis and Lamisil therapy long-term.  Plan: Start him on an every other day dose of Lamisil.  Debrided nails for him today.

## 2018-09-10 DIAGNOSIS — H33302 Unspecified retinal break, left eye: Secondary | ICD-10-CM | POA: Diagnosis not present

## 2018-09-10 DIAGNOSIS — H43391 Other vitreous opacities, right eye: Secondary | ICD-10-CM | POA: Diagnosis not present

## 2018-09-10 DIAGNOSIS — H35371 Puckering of macula, right eye: Secondary | ICD-10-CM | POA: Diagnosis not present

## 2018-09-10 DIAGNOSIS — H35411 Lattice degeneration of retina, right eye: Secondary | ICD-10-CM | POA: Diagnosis not present

## 2018-09-20 DIAGNOSIS — I2510 Atherosclerotic heart disease of native coronary artery without angina pectoris: Secondary | ICD-10-CM | POA: Diagnosis not present

## 2018-09-20 DIAGNOSIS — Z6834 Body mass index (BMI) 34.0-34.9, adult: Secondary | ICD-10-CM | POA: Diagnosis not present

## 2018-09-20 DIAGNOSIS — N183 Chronic kidney disease, stage 3 (moderate): Secondary | ICD-10-CM | POA: Diagnosis not present

## 2018-09-20 DIAGNOSIS — G4733 Obstructive sleep apnea (adult) (pediatric): Secondary | ICD-10-CM | POA: Diagnosis not present

## 2018-09-20 DIAGNOSIS — I10 Essential (primary) hypertension: Secondary | ICD-10-CM | POA: Diagnosis not present

## 2018-09-20 DIAGNOSIS — E1129 Type 2 diabetes mellitus with other diabetic kidney complication: Secondary | ICD-10-CM | POA: Diagnosis not present

## 2018-10-05 DIAGNOSIS — Z6834 Body mass index (BMI) 34.0-34.9, adult: Secondary | ICD-10-CM | POA: Diagnosis not present

## 2018-10-05 DIAGNOSIS — L259 Unspecified contact dermatitis, unspecified cause: Secondary | ICD-10-CM | POA: Diagnosis not present

## 2018-10-10 ENCOUNTER — Encounter: Payer: Self-pay | Admitting: Podiatry

## 2018-10-10 ENCOUNTER — Ambulatory Visit (INDEPENDENT_AMBULATORY_CARE_PROVIDER_SITE_OTHER): Payer: Medicare Other | Admitting: Podiatry

## 2018-10-10 DIAGNOSIS — M79674 Pain in right toe(s): Secondary | ICD-10-CM

## 2018-10-10 DIAGNOSIS — M79675 Pain in left toe(s): Secondary | ICD-10-CM | POA: Diagnosis not present

## 2018-10-10 DIAGNOSIS — B351 Tinea unguium: Secondary | ICD-10-CM

## 2018-10-10 NOTE — Patient Instructions (Signed)

## 2018-10-10 NOTE — Progress Notes (Signed)
Subjective:  Patient presents to clinic with cc of  painful, thick, discolored, elongated toenails 1-5 b/l that become tender and cannot cut because of thickness.  Patient continues to take oral Lamisil every other day.  He is relates he has a few pills left.   Objective:  Physical Examination:  Neurovascular status intact bilaterally symmetrically  Noted improvement toenails 2 through 5 bilaterally.  Bilateral great toes remain yellow, thickened, dystrophic with subungual debris.  Bilateral great toes tender to palpation.    Assessment: Painful onychomycosis bilateral great toes  Plan:  Bilateral great toenails were debrided in length and girth.  He is to continue taking his oral Lamisil until all pills are gone.  He is to continue this instructions he was given by Dr. Milinda Pointer. Follow up 9 weeks.

## 2018-10-12 ENCOUNTER — Ambulatory Visit: Payer: Medicare Other | Admitting: Podiatry

## 2018-11-20 ENCOUNTER — Ambulatory Visit: Payer: Medicare Other | Admitting: Podiatry

## 2018-12-12 ENCOUNTER — Other Ambulatory Visit: Payer: Self-pay

## 2018-12-12 ENCOUNTER — Ambulatory Visit (INDEPENDENT_AMBULATORY_CARE_PROVIDER_SITE_OTHER): Payer: Medicare Other | Admitting: Podiatry

## 2018-12-12 VITALS — Temp 98.0°F

## 2018-12-12 DIAGNOSIS — N183 Chronic kidney disease, stage 3 (moderate): Secondary | ICD-10-CM | POA: Diagnosis not present

## 2018-12-12 DIAGNOSIS — N401 Enlarged prostate with lower urinary tract symptoms: Secondary | ICD-10-CM | POA: Diagnosis not present

## 2018-12-12 DIAGNOSIS — N3941 Urge incontinence: Secondary | ICD-10-CM | POA: Diagnosis not present

## 2018-12-12 DIAGNOSIS — L603 Nail dystrophy: Secondary | ICD-10-CM

## 2018-12-12 DIAGNOSIS — M79674 Pain in right toe(s): Secondary | ICD-10-CM | POA: Diagnosis not present

## 2018-12-12 DIAGNOSIS — M79675 Pain in left toe(s): Secondary | ICD-10-CM

## 2018-12-12 DIAGNOSIS — N200 Calculus of kidney: Secondary | ICD-10-CM | POA: Diagnosis not present

## 2018-12-12 DIAGNOSIS — B351 Tinea unguium: Secondary | ICD-10-CM

## 2018-12-12 DIAGNOSIS — E1129 Type 2 diabetes mellitus with other diabetic kidney complication: Secondary | ICD-10-CM | POA: Diagnosis not present

## 2018-12-13 DIAGNOSIS — N200 Calculus of kidney: Secondary | ICD-10-CM | POA: Diagnosis not present

## 2018-12-13 DIAGNOSIS — I129 Hypertensive chronic kidney disease with stage 1 through stage 4 chronic kidney disease, or unspecified chronic kidney disease: Secondary | ICD-10-CM | POA: Diagnosis not present

## 2018-12-13 DIAGNOSIS — E1122 Type 2 diabetes mellitus with diabetic chronic kidney disease: Secondary | ICD-10-CM | POA: Diagnosis not present

## 2018-12-13 DIAGNOSIS — N183 Chronic kidney disease, stage 3 (moderate): Secondary | ICD-10-CM | POA: Diagnosis not present

## 2018-12-16 ENCOUNTER — Encounter: Payer: Self-pay | Admitting: Podiatry

## 2018-12-16 NOTE — Progress Notes (Signed)
Subjective: Keith [name]. presents today with painful, thick toenails 1-5 b/l that he cannot cut and which interfere with daily activities.  Pain is aggravated when wearing enclosed shoe gear.  Leanna Battles, MD is his PCP.   ALLERGIES: PCN (ANAPHYLAXIS: EYES SWELL SHUT)  Objective: Vascular Examination: Capillary refill time immediate x 10 digits  Dorsalis pedis and Posterior tibial pulses palpable b/l  Digital hair present x 10 digits  Skin temperature gradient WNL b/l  Dermatological Examination: Skin with normal turgor, texture and tone b/l  Toenails b/l hallux  discolored, thick, dystrophic with subungual debris distal 1/5 of nailplates.  Musculoskeletal: Muscle strength 5/5 to all LE muscle groups  No gross bony deformities b/l.  No pain, crepitus or joint limitation noted with ROM.   Neurological: Sensation intact with 10 gram monofilament.  Vibratory sensation intact.  Assessment: Painful onychomycosis toenails b/l hallux   Plan: 1. Toenails b/l hallux were debrided in length and girth without iatrogenic bleeding. 2. Patient to continue soft, supportive shoe gear daily. 3. Patient to report any pedal injuries to medical professional immediately. 4. Follow up 9 weeks.  5. Patient/POA to call should there be a concern in the interim.

## 2018-12-17 DIAGNOSIS — N401 Enlarged prostate with lower urinary tract symptoms: Secondary | ICD-10-CM | POA: Diagnosis not present

## 2018-12-17 DIAGNOSIS — R350 Frequency of micturition: Secondary | ICD-10-CM | POA: Diagnosis not present

## 2018-12-17 DIAGNOSIS — R351 Nocturia: Secondary | ICD-10-CM | POA: Diagnosis not present

## 2018-12-17 DIAGNOSIS — N3941 Urge incontinence: Secondary | ICD-10-CM | POA: Diagnosis not present

## 2018-12-26 DIAGNOSIS — N3941 Urge incontinence: Secondary | ICD-10-CM | POA: Diagnosis not present

## 2018-12-26 DIAGNOSIS — N311 Reflex neuropathic bladder, not elsewhere classified: Secondary | ICD-10-CM | POA: Diagnosis not present

## 2018-12-26 DIAGNOSIS — R3914 Feeling of incomplete bladder emptying: Secondary | ICD-10-CM | POA: Diagnosis not present

## 2018-12-31 DIAGNOSIS — N3941 Urge incontinence: Secondary | ICD-10-CM | POA: Diagnosis not present

## 2018-12-31 DIAGNOSIS — N311 Reflex neuropathic bladder, not elsewhere classified: Secondary | ICD-10-CM | POA: Diagnosis not present

## 2018-12-31 DIAGNOSIS — M6281 Muscle weakness (generalized): Secondary | ICD-10-CM | POA: Diagnosis not present

## 2018-12-31 DIAGNOSIS — R350 Frequency of micturition: Secondary | ICD-10-CM | POA: Diagnosis not present

## 2018-12-31 DIAGNOSIS — M6289 Other specified disorders of muscle: Secondary | ICD-10-CM | POA: Diagnosis not present

## 2018-12-31 DIAGNOSIS — R3914 Feeling of incomplete bladder emptying: Secondary | ICD-10-CM | POA: Diagnosis not present

## 2019-01-09 DIAGNOSIS — R3914 Feeling of incomplete bladder emptying: Secondary | ICD-10-CM | POA: Diagnosis not present

## 2019-01-09 DIAGNOSIS — N3941 Urge incontinence: Secondary | ICD-10-CM | POA: Diagnosis not present

## 2019-01-09 DIAGNOSIS — R351 Nocturia: Secondary | ICD-10-CM | POA: Diagnosis not present

## 2019-01-09 DIAGNOSIS — M62838 Other muscle spasm: Secondary | ICD-10-CM | POA: Diagnosis not present

## 2019-01-09 DIAGNOSIS — M6281 Muscle weakness (generalized): Secondary | ICD-10-CM | POA: Diagnosis not present

## 2019-01-15 DIAGNOSIS — M62838 Other muscle spasm: Secondary | ICD-10-CM | POA: Diagnosis not present

## 2019-01-15 DIAGNOSIS — R3914 Feeling of incomplete bladder emptying: Secondary | ICD-10-CM | POA: Diagnosis not present

## 2019-01-15 DIAGNOSIS — R3915 Urgency of urination: Secondary | ICD-10-CM | POA: Diagnosis not present

## 2019-01-15 DIAGNOSIS — R350 Frequency of micturition: Secondary | ICD-10-CM | POA: Diagnosis not present

## 2019-01-15 DIAGNOSIS — M6281 Muscle weakness (generalized): Secondary | ICD-10-CM | POA: Diagnosis not present

## 2019-01-15 DIAGNOSIS — N3941 Urge incontinence: Secondary | ICD-10-CM | POA: Diagnosis not present

## 2019-01-22 DIAGNOSIS — M6281 Muscle weakness (generalized): Secondary | ICD-10-CM | POA: Diagnosis not present

## 2019-01-22 DIAGNOSIS — R350 Frequency of micturition: Secondary | ICD-10-CM | POA: Diagnosis not present

## 2019-01-22 DIAGNOSIS — R3914 Feeling of incomplete bladder emptying: Secondary | ICD-10-CM | POA: Diagnosis not present

## 2019-01-22 DIAGNOSIS — M62838 Other muscle spasm: Secondary | ICD-10-CM | POA: Diagnosis not present

## 2019-01-22 DIAGNOSIS — R351 Nocturia: Secondary | ICD-10-CM | POA: Diagnosis not present

## 2019-01-22 DIAGNOSIS — N3941 Urge incontinence: Secondary | ICD-10-CM | POA: Diagnosis not present

## 2019-02-05 DIAGNOSIS — N3941 Urge incontinence: Secondary | ICD-10-CM | POA: Diagnosis not present

## 2019-02-05 DIAGNOSIS — R350 Frequency of micturition: Secondary | ICD-10-CM | POA: Diagnosis not present

## 2019-02-05 DIAGNOSIS — R3914 Feeling of incomplete bladder emptying: Secondary | ICD-10-CM | POA: Diagnosis not present

## 2019-02-05 DIAGNOSIS — M62838 Other muscle spasm: Secondary | ICD-10-CM | POA: Diagnosis not present

## 2019-02-05 DIAGNOSIS — M6281 Muscle weakness (generalized): Secondary | ICD-10-CM | POA: Diagnosis not present

## 2019-02-19 DIAGNOSIS — M62838 Other muscle spasm: Secondary | ICD-10-CM | POA: Diagnosis not present

## 2019-02-19 DIAGNOSIS — N3941 Urge incontinence: Secondary | ICD-10-CM | POA: Diagnosis not present

## 2019-02-19 DIAGNOSIS — M6281 Muscle weakness (generalized): Secondary | ICD-10-CM | POA: Diagnosis not present

## 2019-02-22 ENCOUNTER — Ambulatory Visit: Payer: Medicare Other | Admitting: Podiatry

## 2019-03-08 ENCOUNTER — Ambulatory Visit: Payer: Medicare Other | Admitting: Podiatry

## 2019-03-14 DIAGNOSIS — L570 Actinic keratosis: Secondary | ICD-10-CM | POA: Diagnosis not present

## 2019-03-14 DIAGNOSIS — Z85828 Personal history of other malignant neoplasm of skin: Secondary | ICD-10-CM | POA: Diagnosis not present

## 2019-04-05 DIAGNOSIS — N200 Calculus of kidney: Secondary | ICD-10-CM | POA: Diagnosis not present

## 2019-04-05 DIAGNOSIS — N401 Enlarged prostate with lower urinary tract symptoms: Secondary | ICD-10-CM | POA: Diagnosis not present

## 2019-04-05 DIAGNOSIS — R351 Nocturia: Secondary | ICD-10-CM | POA: Diagnosis not present

## 2019-04-05 DIAGNOSIS — N311 Reflex neuropathic bladder, not elsewhere classified: Secondary | ICD-10-CM | POA: Diagnosis not present

## 2019-04-17 ENCOUNTER — Encounter: Payer: Self-pay | Admitting: Podiatry

## 2019-04-17 ENCOUNTER — Ambulatory Visit (INDEPENDENT_AMBULATORY_CARE_PROVIDER_SITE_OTHER): Payer: Medicare Other | Admitting: Podiatry

## 2019-04-17 ENCOUNTER — Other Ambulatory Visit: Payer: Self-pay

## 2019-04-17 DIAGNOSIS — M79674 Pain in right toe(s): Secondary | ICD-10-CM

## 2019-04-17 DIAGNOSIS — M79675 Pain in left toe(s): Secondary | ICD-10-CM

## 2019-04-17 DIAGNOSIS — B351 Tinea unguium: Secondary | ICD-10-CM | POA: Diagnosis not present

## 2019-04-17 NOTE — Patient Instructions (Addendum)
Diabetes Mellitus and Foot Care Foot care is an important part of your health, especially when you have diabetes. Diabetes may cause you to have problems because of poor blood flow (circulation) to your feet and legs, which can cause your skin to:  Become thinner and drier.  Break more easily.  Heal more slowly.  Peel and crack. You may also have nerve damage (neuropathy) in your legs and feet, causing decreased feeling in them. This means that you may not notice minor injuries to your feet that could lead to more serious problems. Noticing and addressing any potential problems early is the best way to prevent future foot problems. How to care for your feet Foot hygiene  Wash your feet daily with warm water and mild soap. Do not use hot water. Then, pat your feet and the areas between your toes until they are completely dry. Do not soak your feet as this can dry your skin.  Trim your toenails straight across. Do not dig under them or around the cuticle. File the edges of your nails with an emery board or nail file.  Apply a moisturizing lotion or petroleum jelly to the skin on your feet and to dry, brittle toenails. Use lotion that does not contain alcohol and is unscented. Do not apply lotion between your toes. Shoes and socks  Wear clean socks or stockings every day. Make sure they are not too tight. Do not wear knee-high stockings since they may decrease blood flow to your legs.  Wear shoes that fit properly and have enough cushioning. Always look in your shoes before you put them on to be sure there are no objects inside.  To break in new shoes, wear them for just a few hours a day. This prevents injuries on your feet. Wounds, scrapes, corns, and calluses  Check your feet daily for blisters, cuts, bruises, sores, and redness. If you cannot see the bottom of your feet, use a mirror or ask someone for help.  Do not cut corns or calluses or try to remove them with medicine.  If you  find a minor scrape, cut, or break in the skin on your feet, keep it and the skin around it clean and dry. You may clean these areas with mild soap and water. Do not clean the area with peroxide, alcohol, or iodine.  If you have a wound, scrape, corn, or callus on your foot, look at it several times a day to make sure it is healing and not infected. Check for: ? Redness, swelling, or pain. ? Fluid or blood. ? Warmth. ? Pus or a bad smell. General instructions  Do not cross your legs. This may decrease blood flow to your feet.  Do not use heating pads or hot water bottles on your feet. They may burn your skin. If you have lost feeling in your feet or legs, you may not know this is happening until it is too late.  Protect your feet from hot and cold by wearing shoes, such as at the beach or on hot pavement.  Schedule a complete foot exam at least once a year (annually) or more often if you have foot problems. If you have foot problems, report any cuts, sores, or bruises to your health care provider immediately. Contact a health care provider if:  You have a medical condition that increases your risk of infection and you have any cuts, sores, or bruises on your feet.  You have an injury that is not   healing.  You have redness on your legs or feet.  You feel burning or tingling in your legs or feet.  You have pain or cramps in your legs and feet.  Your legs or feet are numb.  Your feet always feel cold.  You have pain around a toenail. Get help right away if:  You have a wound, scrape, corn, or callus on your foot and: ? You have pain, swelling, or redness that gets worse. ? You have fluid or blood coming from the wound, scrape, corn, or callus. ? Your wound, scrape, corn, or callus feels warm to the touch. ? You have pus or a bad smell coming from the wound, scrape, corn, or callus. ? You have a fever. ? You have a red line going up your leg. Summary  Check your feet every day  for cuts, sores, red spots, swelling, and blisters.  Moisturize feet and legs daily.  Wear shoes that fit properly and have enough cushioning.  If you have foot problems, report any cuts, sores, or bruises to your health care provider immediately.  Schedule a complete foot exam at least once a year (annually) or more often if you have foot problems. This information is not intended to replace advice given to you by your health care provider. Make sure you discuss any questions you have with your health care provider. Document Released: 08/26/2000 Document Revised: 10/11/2017 Document Reviewed: 09/30/2016 Elsevier Patient Education  2020 [employer]   Onychomycosis/Fungal Toenails  WHAT IS IT? An infection that lies within the keratin of your nail plate that is caused by a fungus.  WHY ME? Fungal infections affect all ages, sexes, races, and creeds.  There may be many factors that predispose you to a fungal infection such as age, coexisting medical conditions such as diabetes, or an autoimmune disease; stress, medications, fatigue, genetics, etc.  Bottom line: fungus thrives in a warm, moist environment and your shoes offer such a location.  IS IT CONTAGIOUS? Theoretically, yes.  You do not want to share shoes, nail clippers or files with someone who has fungal toenails.  Walking around barefoot in the same room or sleeping in the same bed is unlikely to transfer the organism.  It is important to realize, however, that fungus can spread easily from one nail to the next on the same foot.  HOW DO WE TREAT THIS?  There are several ways to treat this condition.  Treatment may depend on many factors such as age, medications, pregnancy, liver and kidney conditions, etc.  It is best to ask your doctor which options are available to you.  1. No treatment.   Unlike many other medical concerns, you can live with this condition.  However for many people this can be a painful condition and may lead to  ingrown toenails or a bacterial infection.  It is recommended that you keep the nails cut short to help reduce the amount of fungal nail. 2. Topical treatment.  These range from herbal remedies to prescription strength nail lacquers.  About 40-50% effective, topicals require twice daily application for approximately 9 to 12 months or until an entirely new nail has grown out.  The most effective topicals are medical grade medications available through physicians offices. 3. Oral antifungal medications.  With an 80-90% cure rate, the most common oral medication requires 3 to 4 months of therapy and stays in your system for a year as the new nail grows out.  Oral antifungal medications do require   blood work to make sure it is a safe drug for you.  A liver function panel will be performed prior to starting the medication and after the first month of treatment.  It is important to have the blood work performed to avoid any harmful side effects.  In general, this medication safe but blood work is required. 4. Laser Therapy.  This treatment is performed by applying a specialized laser to the affected nail plate.  This therapy is noninvasive, fast, and non-painful.  It is not covered by insurance and is therefore, out of pocket.  The results have been very good with a 80-95% cure rate.  The [facility] is the only practice in the area to offer this therapy. 5. Permanent Nail Avulsion.  Removing the entire nail so that a new nail will not grow back. 

## 2019-04-21 NOTE — Progress Notes (Signed)
Subjective:  Keith Bluitt. presents to clinic today with cc of  painful, thick, discolored, elongated toenails b/l hallux that become tender and cannot cut because of thickness.  Pain is aggravated when wearing enclosed shoe gear and relieved with periodic professional debridement.   Current Outpatient Medications:  .  aspirin EC 81 MG tablet, Take 81 mg by mouth daily., Disp: , Rfl:  .  atorvastatin (LIPITOR) 20 MG tablet, Take 20 mg by mouth daily., Disp: , Rfl:  .  cholecalciferol (VITAMIN D) 1000 units tablet, Take 1,000 Units by mouth daily., Disp: , Rfl:  .  Cholecalciferol (VITAMIN D) 2000 UNITS tablet, Take 2,000 Units by mouth daily., Disp: , Rfl:  .  clindamycin (CLEOCIN) 300 MG capsule, , Disp: , Rfl:  .  clopidogrel (PLAVIX) 75 MG tablet, Take 75 mg by mouth daily., Disp: , Rfl:  .  docusate sodium (COLACE) 100 MG capsule, Take 1 capsule (100 mg total) by mouth 2 (two) times daily., Disp: 10 capsule, Rfl: 0 .  escitalopram (LEXAPRO) 20 MG tablet, Take 20 mg by mouth daily. , Disp: , Rfl:  .  ferrous sulfate (FERROUSUL) 325 (65 FE) MG tablet, Take 1 tablet (325 mg total) by mouth 3 (three) times daily with meals., Disp: , Rfl: 3 .  finasteride (PROSCAR) 5 MG tablet, Take 5 mg by mouth daily. , Disp: , Rfl:  .  HYDROcodone-acetaminophen (NORCO) 7.5-325 MG tablet, Take 1-2 tablets by mouth every 4 (four) hours as needed for moderate pain., Disp: 60 tablet, Rfl: 0 .  Investigational - Study Medication, Inject 5 mg into the skin every Tuesday. Study name:Tirzepatide 5mg  subcutaneously once a week on Tuesdays Additional study [place] [facility] Study, Disp: , Rfl:  .  magnesium oxide (MAG-OX) 400 MG tablet, Take 400 mg by mouth daily., Disp: , Rfl:  .  methocarbamol (ROBAXIN) 500 MG tablet, Take 1 tablet (500 mg total) by mouth every 6 (six) hours as needed for muscle spasms., Disp: 40 tablet, Rfl: 0 .  metoprolol tartrate (LOPRESSOR) 25 MG tablet, Take  25 mg by mouth 2 (two) times daily., Disp: , Rfl:  .  mirabegron ER (MYRBETRIQ) 25 MG TB24 tablet, Take 25 mg by mouth daily. , Disp: , Rfl:  .  NITROSTAT 0.4 MG SL tablet, Place 0.4 mg under the tongue every 5 (five) minutes as needed for chest pain. , Disp: , Rfl:  .  Omega-3 Fatty Acids (FISH OIL) 1000 MG CAPS, Take 1,000 mg by mouth daily., Disp: , Rfl:  .  oxybutynin (DITROPAN-XL) 10 MG 24 hr tablet, , Disp: , Rfl:  .  polyethylene glycol (MIRALAX / GLYCOLAX) packet, Take 17 g by mouth 2 (two) times daily., Disp: 14 each, Rfl: 0 .  predniSONE (DELTASONE) 20 MG tablet, , Disp: , Rfl:  .  terbinafine (LAMISIL) 250 MG tablet, Take 1 tablet (250 mg total) by mouth daily., Disp: 30 tablet, Rfl: 0 .  triamcinolone cream (KENALOG) 0.1 %, , Disp: , Rfl:    Allergies: PCN (eyes swell shut) Objective: Physical Examination:  Vascular Examination: Capillary refill time immediate x 10 digits.  Palpable DP/PT pulses b/l.  Digital hair present b/l.  No edema noted b/l.  Skin temperature gradient WNL b/l.  Dermatological Examination: Skin with normal turgor, texture and tone b/l.  No open wounds b/l.  No interdigital macerations noted b/l.  Elongated, thick, discolored brittle toenails with subungual debris and pain on dorsal palpation of nailbeds b/l hallux.  Musculoskeletal Examination: Muscle strength 5/5 to all muscle groups b/l.  No pain, crepitus or joint discomfort with active/passive ROM.  Neurological Examination: Sensation intact 5/5 b/l with 10 gram monofilament.  Vibratory sensation intact b/l.  Proprioceptive sensation intact b/l.  Assessment: Mycotic nail infection with pain 1-5 b/l  Plan: 1. Toenails b/l hallux were debrided in length and girth without iatrogenic laceration. 2.  Continue soft, supportive shoe gear daily. 3.  Report any pedal injuries to medical professional. 4.  Follow up 3 months. 5.  Patient/POA to call should there be a question/concern in  there interim.

## 2019-05-29 DIAGNOSIS — M25562 Pain in left knee: Secondary | ICD-10-CM | POA: Diagnosis not present

## 2019-06-10 DIAGNOSIS — Z23 Encounter for immunization: Secondary | ICD-10-CM | POA: Diagnosis not present

## 2019-06-20 DIAGNOSIS — N200 Calculus of kidney: Secondary | ICD-10-CM | POA: Diagnosis not present

## 2019-06-20 DIAGNOSIS — I129 Hypertensive chronic kidney disease with stage 1 through stage 4 chronic kidney disease, or unspecified chronic kidney disease: Secondary | ICD-10-CM | POA: Diagnosis not present

## 2019-06-20 DIAGNOSIS — N183 Chronic kidney disease, stage 3 unspecified: Secondary | ICD-10-CM | POA: Diagnosis not present

## 2019-07-03 ENCOUNTER — Ambulatory Visit (INDEPENDENT_AMBULATORY_CARE_PROVIDER_SITE_OTHER): Payer: Medicare Other | Admitting: Neurology

## 2019-07-03 ENCOUNTER — Encounter: Payer: Self-pay | Admitting: Neurology

## 2019-07-03 ENCOUNTER — Other Ambulatory Visit: Payer: Self-pay

## 2019-07-03 VITALS — BP 141/80 | HR 67 | Temp 97.8°F | Ht 72.0 in | Wt 267.0 lb

## 2019-07-03 DIAGNOSIS — E669 Obesity, unspecified: Secondary | ICD-10-CM

## 2019-07-03 DIAGNOSIS — G4733 Obstructive sleep apnea (adult) (pediatric): Secondary | ICD-10-CM | POA: Diagnosis not present

## 2019-07-03 DIAGNOSIS — R351 Nocturia: Secondary | ICD-10-CM

## 2019-07-03 DIAGNOSIS — R634 Abnormal weight loss: Secondary | ICD-10-CM

## 2019-07-03 DIAGNOSIS — R519 Headache, unspecified: Secondary | ICD-10-CM

## 2019-07-03 DIAGNOSIS — G4719 Other hypersomnia: Secondary | ICD-10-CM | POA: Diagnosis not present

## 2019-07-03 NOTE — Progress Notes (Addendum)
Subjective:    [name] ID: Keith [name]. is a 73 y.o. male.  HPI     Star Age, MD, PhD [facility] [address], [address] P.O. [place],  [address]  Dear Dr. Philip Aspen, I saw your [name], Keith [name], upon your kind request in my sleep clinic today for reevaluation of his obstructive sleep apnea, on treatment with BiPAP.  Keith [name] is accompanied by his wife today.  I have previously followed him for his obstructive sleep apnea and recurrent headaches.  I last saw him over 3 years ago at which time he was compliant with his BiPAP ST and he was taking Topamax low-dose twice daily for headache prevention.  As you know, Keith [name] is a 73 year old right-handed gentleman with an underlying medical history of type 2 diabetes, osteoarthritis, hyperlipidemia, depression, allergic rhinitis, kidney stones, recurrent headaches, status post retinal detachment surgery, status post right, then left knee replacements, status post TURP, and obesity, who has been on BiPAP ST for his sleep apnea for years.  He had sleep study testing through our office on 04/29/2014 which was a split-night sleep study and a subsequent titration study in September 2015.  I reviewed his BiPAP compliance data.  Out of Keith last 30 days he used his machine only 12 days, residual AHI around 8.1/h.  Average usage for days on treatment of 4 hours and 10 minutes, leak on Keith high side with a 95th percentile at 55 L/min, pressure of 20/16 with a backup rate of 12.  In Keith past 90 days, he used his machine 32 days.  Residual AHI around 8.4.  He reports that he is trying to get back on Keith machine, he finds it uncomfortable.  He uses a full facemask.  In Keith past 9 days he has consistently tried to use his machine.  His wife reports that he does not snore when he sleeps with Keith BiPAP and seems to be less restless, less twitching noted by wife.  He feels that Keith settings are not optimal, he had stopped  using his machine because he had been able to lose weight but he gained some weight back.  When he started gaining weight again, his wife noticed recurrence of snoring.  He would be willing to get retested for his sleep apnea and consider consistently using a CPAP or BiPAP machine.  He would be eligible for a new machine since his set up date was 05/12/2014.  His Epworth sleepiness score is 9 out of 24, fatigue severity score is 62 out of 63.  His sleep is interrupted.  He has nocturia about once per average night.  He generally goes to bed around 10, rise time is between 8 and 9.  He has to get up at 4 to let Keith 2 dogs out, then he has to feed him at 5 AM.  He has had morning headaches at times.  He has limited his caffeine intake to 1 cup of chai tea per day, occasional soda or occasional ice tea.  He does not smoke cigarettes, he smokes a cigar a month approximately. He drinks alcohol rarely.   Previously:  06/28/2016: 73 year old right-handed gentleman with an underlying medical history of obesity, BPH, bladder stones, type 2 diabetes, degenerative joint disease and chronic back pain, hyperlipidemia, chronic rhinitis, depression, and migraine headaches, who presents for follow-up consultation of his OSA, on BiPAP ST, and his recurrent headaches. I last saw him on 12/28/2015, at which time he  was no longer fully compliant with BiPAP ST. He had essentially stopped using his machine in March 2017 and reported that he was not able to use BiPAP, he had issues with tolerance to Keith high pressure and was not able to get replacement supplies. I had renewed his supplies in April 2017. He was on Topamax but had not increased Keith dose. His headaches were stable. I suggested he continue with his current dose. I prescribed new supplies for his BiPAP and suggested he try a different full face mask.   I reviewed his BiPAP compliance data from 05/28/2016 through 06/26/2016 which is a total of 30 days, during which time he  used his machine every night with percent used days greater than 4 hours at 83%, indicating very good compliance with an average usage of 5 hours and 40 minutes, residual AHI 4.3 per hour, leak either Keith 95th percentile at 61.2 L/m on a pressure of 22/18 with a rate of 12.     I saw him on 08/19/2015, at which time he was referred for recurrent headaches. He had ophthalmology workup. He was on Topamax 25 mg twice daily and I suggested he increase this gradually to 50 mg twice daily.    I reviewed his BiPAP compliance data from 09/28/2015 through 12/26/2015 which is a total of 90 days during which time he used his machine 60 days with complaints percentage of 46%, pressure of 22/18 with a rate of 12. Residual AHI 3.7, leak quite high. He has essentially stopped using his machine at Keith end of March.   06/24/2015 for his complex sleep apnea for which he is on treatment with BiPAP therapy. He was not fully compliant with treatment at Keith time. He reported that his wife had to have spine surgery at [facility] and he was staying with her quite a bit and he could not use his BiPAP. He did note that he slept better when he was using his machine. He had started a new diabetes medication and had lost about 10 pounds. His sugar values improved as well. He presented to Keith emergency room on 08/05/2015 with intermittent throbbing headaches with visual disturbance. He had a throbbing headache behind his left eye. He had bright spots and what sounded like visual auras. He had decreased vision of Keith right eye for about a minute. He saw his eye doctor. He had a carotid Doppler study on 08/04/15, which showed less than 40% stenosis of Keith b/l ICAs. I reviewed Keith test results.   I also reviewed Keith emergency room records as well as Keith neurological consultation note by Dr. Nicole Kindred. During his emergency room stay he had a brain MRI without contrast as well as a brain MRA without contrast: IMPRESSION: 1. No acute intracranial  abnormality. 2. Moderate cerebral atrophy. 3. No medium or large vessel intracranial arterial occlusion or significant stenosis.   He was started on Topamax for headache prevention, 25 mg twice daily. He was advised to continue with Plavix once daily.   Blood test results through Keith emergency room from 08/05/2015 showed: BUN elevated at 27, creatinine elevated at 1.65, CO2 at 20, capillary blood sugar was 101, urinalysis negative for infection but positive for hemoglobin. CBC showed hemoglobin mildly low at 12.5 and hematocrit mildly low normal at 38.1.    I reviewed his BiPAP compliance data from 07/19/2015 through 08/17/2015 which is a total of 30 days during which time he used his machine 29 days with percent used days greater than  4 hours at 43%, indicating suboptimal compliance with an average usage of only 3 hours and 38 minutes, residual AHI low at 1.9 per hour, leaked low with Keith 95th percentile at 5.3 L/m on a pressure of 22/18 with a rate of 12/m.    I saw him on 12/23/2014, at which time he reported that he was not using his BiPAP regularly secondary to a recent cough. He had increased his compliance however. He had some dryness of mouth. He still had nocturia about 3 times per night. He felt that this was actually better. Overall, he was doing fairly well. In Keith interim, he had left-sided lithotripsy for kidney stones on Keith left kidney on 01/26/2015.    I reviewed his BiPAP compliance data from 05/24/2015 through 06/22/2015 which is a total of 30 days during which time he used his machine only 16 days with percent used days greater than 4 hours at only 13%, indicating poor compliance with an average usage of 3 hours and 17 minutes but overall daily usage of only 1 hour and 45 minutes, residual AHI at 1.7 per hour, leak acceptable for Keith 95th percentile at 12.8 L/m on a pressure of 22/18 with a rate of 12.   I saw him on 09/25/2014, at which time he reported still some trouble  tolerating his mask. He started using his old mask from years ago and I tried to order it. Overall, he felt reasonably well treated with BiPAP and he was compliant. I suggested a six-month follow-up.   I reviewed his BiPAP ST compliance data from 11/22/2014 through 12/21/2014 which is a total of 30 days during which time he used his mask only 18 days with percent used days greater than 4 hours at only 23%, indicating poor compliance and much worse compliance than before. Average usage for all nights of only 2 hours and 2 minutes, pressure at 22/18 cm with a rate of 12. Residual AHI borderline at 5.9 and leak for Keith most part acceptable with Keith 95th percentile at 23.3 L/m.      I saw him on 06/23/2014, at which time he reported using his BiPAP machine regularly. He had left heart catheterization and also went on a 2 day trip to a friend's house where there was no access to electricity in Keith room he was not using his BiPAP during his naps. He reported severe mouth dryness at times. His nocturia had improved about 50% he felt. He had some air leak issues from Keith mask which improved when he changed Keith size of Keith mask. He was using a fullface mask. I discussed a sleep study results at length last time. His physical exam was stable. I asked him to continue to use his BiPAP regularly, also during his naps.   I reviewed his compliance data from 08/23/2014 through 09/21/2014 which is a total of 30 days during which time he used his BiPAP every night except for 3 nights. Percent used days greater than 4 hours was 80%, indicating very good compliance, average usage of 5 hours and 26 minutes, setting of BiPAP ST at 22/18 with a rate of 12, residual AHI acceptable at 3.6 per hour and leak acceptable with Keith 95th percentile at 15.1 L/m.   I first met him on 03/13/2014, at which time he reported a prior diagnosis with obstructive sleep apnea over 25 years ago. He had been using CPAP off and on for years with some  improvement in snoring and in his sleep  reported. He reported purchasing his own CPAP machine and had brought it to his appointment last time, which is a ResMed S6, without a humidifier and he had a large [employer] FFM, as he is a mouth breather. As he had not had a reevaluation of his sleep apnea, I suggested he return for a sleep study. He had a split-night sleep study on 04/29/2014 as well as a full night titration study subsequently on 05/19/2014 and I went over his sleep study results with him in detail today. His split-night sleep study from 04/29/2014 showed a baseline sleep efficiency at 73.9% with a latency sleep of 11.5 minutes and wake after sleep onset of 32 minutes with moderate to severe sleep fragmentation noted. He had an elevated arousal index. He had an elevated percentage of stage I and stage II sleep, and absence of dream sleep. He had multifocal PVCs. He had a brief 6 beat run of what appeared to be ventricular tachycardia followed by multifocal PVCs. Severe PLMS were noted with very little arousals. He had a total of 1 obstructive to mixed in 57 central apneas as well as 42 obstructive and 14 central hypopneas with an elevated AHI of 56.6 per hour. Baseline oxygen saturation was 91%, nadir was 78%. He was therefore started on CPAP. This was started at 5 cm and increased to 6 cm then decreased again to 4 cm because he had ongoing central sleep disordered breathing. His AHI remained high. He had an increased and deepened dream sleep. His baseline oxygen saturation was slightly improved at 92%. Nadir was 81%. He was switched to BiPAP therapy first at 10/6 cm and titrated gradually. He still had significant central sleep apneas. He was switched to BiPAP ST with a final pressure of 16/12 at a rate of 10. However, on Keith final settings he still had an AHI of 11.7 per hour. I suggested he start BiPAP ST at home but was also advised to return for a full night BiPAP ST titration for proper  optimalization of his treatment settings. He had a BiPAP study on 05/19/2014. Sleep efficiency was 87.6% with a latency to sleep of 14.5 minutes and wake after sleep onset of 46.5 minutes with mild to moderate sleep fragmentation noted. Arousal index was mildly elevated. He had an increased percentage of light stage sleep, nearly 5% of deep sleep, and a decreased percentage of dream sleep at only 3.2%. Latency to REM sleep was very prolonged at 350.5 minutes. Moderate periodic leg movements were noted at 36.8 per hour, resulting in an arousal index of 4.2 per hour. Rare PVCs were noted and overall his EKG looked much improved from his original sleep study from 04/29/2014. He had a total of one central apnea, 19 obstructive hypopneas and one mixed hypopneas for Keith study. Baseline oxygen saturation was much improved at 95% with a nadir of 80%. On Keith final setting his oxygen saturation remained above 90%. Snoring was eliminated. BiPAP ST was started at 10/6 cm with a rate of 10 using a large fullface mask. A chin strap was needed due to residual mouth opening. BiPAP was titrated to a pressure of 22/18 with a rate of 12 per minute. On this final setting his AHI was 0.8 per hour.   I reviewed his compliance data from 05/21/2014 through 06/19/2014 which is a total of 30 days during which time he used his machine every night, percent used days greater than 4 hours was 90%, indicating excellent compliance, settings of 22/18  with a rate of 12 with a residual AHI of 6.9 per hour but leak was at times high with Keith 95th percentile 42.3 L per minute. Average usage of 6 hours and 11 minutes. More recently in Keith last 10-14 days his sleep had improved. In Keith interim he had a LHC on 06/17/14.    His Past Medical History Is Significant For: Past Medical History:  Diagnosis Date  . Anginal pain ([place])   . Anxiety    takes Celexa  . Arthritis    bil knees  . Benign prostatic hypertrophy    takes Flomax daily  . Bladder  stones 06/25/2013   1.9cm bladder stone.  Marland Kitchen BPH with urinary obstruction 06/25/2013   127cc prostate with large middle lobe and bladder stones.   . Complication of anesthesia    hard to wake up  . Depression   . Diabetes mellitus without complication ([place])   . ED (erectile dysfunction)   . Hyperlipidemia   . Hypertension   . PONV (postoperative nausea and vomiting)   . Sleep apnea    uses bipap machine  . Spinal headache    reports last spinal headache was yesterday 05-16-18, lasted for 2 hours a, relief with tylenol , denies vision change with yesterdays occurrence but wife endorses vision changes with past spinal headaches    . Type 2 DM with CKD stage 3 and hypertension ([place])     His Past Surgical History Is Significant For: Past Surgical History:  Procedure Laterality Date  . CARDIAC CATHETERIZATION  06-15-14  . CATARACT EXTRACTION W/ INTRAOCULAR LENS  IMPLANT, BILATERAL  03/13/2003  . COLONOSCOPY    . CYSTOSCOPY WITH LITHOLAPAXY N/A 06/25/2013   Procedure: CYSTOSCOPY WITH LITHOLAPAXY;  Surgeon: [name], MD;  Location: [facility];  Service: Urology;  Laterality: N/A;  . KNEE ARTHROSCOPY  09/07/2010   left knee  . KNEE ARTHROSCOPY  05/27/2000   left knee  . LEFT HEART CATHETERIZATION WITH CORONARY ANGIOGRAM N/A 06/17/2014   Procedure: LEFT HEART CATHETERIZATION WITH CORONARY ANGIOGRAM;  Surgeon: Laverda Page, MD;  [facility];  Service: Cardiovascular;  Laterality: N/A;  . RETINAL DETACHMENT REPAIR W/ SCLERAL BUCKLE LE  10/14/2002   Dr Zadie Rhine  . TONSILLECTOMY     age 18  . TOTAL KNEE ARTHROPLASTY  08/01/2011   Procedure: TOTAL KNEE ARTHROPLASTY;  Surgeon: Lorn Junes, MD;  Location: [facility];  Service: Orthopedics;  Laterality: Right;  TOTAL KNEE ARTHROPLASTY  RIGHT SIDE  . TOTAL KNEE ARTHROPLASTY Left 05/22/2018   Procedure: LEFT TOTAL KNEE ARTHROPLASTY;  Surgeon: Paralee Cancel, MD;  Location: [facility];  Service: Orthopedics;  Laterality: Left;  . TRANSURETHRAL  RESECTION OF PROSTATE N/A 06/25/2013   Procedure: TRANSURETHRAL RESECTION OF Keith PROSTATE WITH GYRUS INSTRUMENTS;  Surgeon: [name], MD;  Location: [facility];  Service: Urology;  Laterality: N/A;  . [place]    His Family History Is Significant For: Family History  Problem Relation Age of Onset  . Hypertension Mother   . Heart failure Father   . Arthritis Other   . Cancer Other        breast, skin  . Coronary artery disease Other   . Heart disease Other   . Anesthesia problems Neg Hx   . Hypotension Neg Hx   . Malignant hyperthermia Neg Hx   . Pseudochol deficiency Neg Hx     His Social History Is Significant For: Social History   Socioeconomic History  . Marital  status: Married    Spouse name: Not on file  . Number of children: Not on file  . Years of education: Not on file  . Highest education level: Not on file  Occupational History  . Not on file  Social Needs  . Financial resource strain: Not on file  . Food insecurity    Worry: Not on file    Inability: Not on file  . Transportation needs    Medical: Not on file    Non-medical: Not on file  Tobacco Use  . Smoking status: Never Smoker  . Smokeless tobacco: Never Used  Substance and Sexual Activity  . Alcohol use: Yes    Alcohol/week: 0.0 standard drinks    Comment: rare   . Drug use: No  . Sexual activity: Not on file  Lifestyle  . Physical activity    Days per week: Not on file    Minutes per session: Not on file  . Stress: Not on file  Relationships  . Social Herbalist on phone: Not on file    Gets together: Not on file    Attends religious service: Not on file    Active member of club or organization: Not on file    Attends meetings of clubs or organizations: Not on file    Relationship status: Not on file  Other Topics Concern  . Not on file  Social History Narrative   Right handed.     His Allergies Are:  PCN   His Current Medications Are:  Outpatient Encounter  Medications as of 07/03/2019  Medication Sig  . aspirin EC 81 MG tablet Take 81 mg by mouth daily.  Marland Kitchen atorvastatin (LIPITOR) 20 MG tablet Take 20 mg by mouth daily.  . Cholecalciferol (VITAMIN D) 2000 UNITS tablet Take 2,000 Units by mouth daily.  . clindamycin (CLEOCIN) 300 MG capsule   . clopidogrel (PLAVIX) 75 MG tablet Take 75 mg by mouth daily.  Marland Kitchen escitalopram (LEXAPRO) 20 MG tablet Take 20 mg by mouth daily.   . Investigational - Study Medication Inject 5 mg into Keith skin every Tuesday. Study name:Tirzepatide 67m subcutaneously once a week on Tuesdays Additional study [facility] Study  . magnesium oxide (MAG-OX) 400 MG tablet Take 400 mg by mouth daily.  . methocarbamol (ROBAXIN) 500 MG tablet Take 1 tablet (500 mg total) by mouth every 6 (six) hours as needed for muscle spasms.  . metoprolol tartrate (LOPRESSOR) 25 MG tablet Take 25 mg by mouth 2 (two) times daily.  .Marland KitchenNITROSTAT 0.4 MG SL tablet Place 0.4 mg under Keith tongue every 5 (five) minutes as needed for chest pain.   . Omega-3 Fatty Acids (FISH OIL) 1000 MG CAPS Take 1,000 mg by mouth daily.  .Marland Kitchenoxybutynin (DITROPAN-XL) 10 MG 24 hr tablet   . [DISCONTINUED] cholecalciferol (VITAMIN D) 1000 units tablet Take 1,000 Units by mouth daily.  . [DISCONTINUED] docusate sodium (COLACE) 100 MG capsule Take 1 capsule (100 mg total) by mouth 2 (two) times daily.  . [DISCONTINUED] ferrous sulfate (FERROUSUL) 325 (65 FE) MG tablet Take 1 tablet (325 mg total) by mouth 3 (three) times daily with meals.  . [DISCONTINUED] finasteride (PROSCAR) 5 MG tablet Take 5 mg by mouth daily.   . [DISCONTINUED] HYDROcodone-acetaminophen (NORCO) 7.5-325 MG tablet Take 1-2 tablets by mouth every 4 (four) hours as needed for moderate pain.  . [DISCONTINUED] mirabegron ER (MYRBETRIQ) 25 MG TB24 tablet Take 25 mg by  mouth daily.   . [DISCONTINUED] polyethylene glycol (MIRALAX / GLYCOLAX) packet Take 17 g by mouth 2 (two)  times daily.  . [DISCONTINUED] predniSONE (DELTASONE) 20 MG tablet   . [DISCONTINUED] terbinafine (LAMISIL) 250 MG tablet Take 1 tablet (250 mg total) by mouth daily.  . [DISCONTINUED] triamcinolone cream (KENALOG) 0.1 %    No facility-administered encounter medications on file as of 07/03/2019.   :  Review of Systems:  Out of a complete 14 point review of systems, all are reviewed and negative with Keith exception of these symptoms as listed below: Review of Systems  Neurological:       Pt presents today to discuss his bipap. Pt reports that he believes Keith settings may need to be changed and he have another sleep study.  Epworth Sleepiness Scale 0= would never doze 1= slight chance of dozing 2= moderate chance of dozing 3= high chance of dozing  Sitting and reading: 2 Watching TV: 2 Sitting inactive in a public place (ex. Theater or meeting): 0 As a passenger in a car for an hour without a break: 1 Lying down to rest in Keith afternoon: 3 Sitting and talking to someone: 0 Sitting quietly after lunch (no alcohol): 1 In a car, while stopped in traffic: 0 Total: 9     Objective:  Neurological Exam  Physical Exam Physical Examination:   Vitals:   07/03/19 1540  BP: (!) 141/80  Pulse: 67  Temp: 97.8 F (36.6 C)   General Examination: Keith [name] is a very pleasant 73 y.o. male in no acute distress. He appears well-developed and well-nourished and well groomed.   HEENT: Normocephalic, atraumatic, pupils are equal, round and reactive to light, extraocular tracking is good without limitation to gaze excursion or nystagmus noted. Status post bilateral cataract extractions. Normal smooth pursuit is noted. Hearing is grossly intact. Face is symmetric with normal facial animation and normal facial sensation. Speech is clear with no dysarthria noted. There is no hypophonia. There is no lip, neck/head, jaw or voice tremor. Neck is supple with full range of passive and active motion.  There are no carotid bruits on auscultation. Oropharynx exam reveals: mild mouth dryness, adequate dental hygiene and moderate airway crowding, due to redundant soft palate. Mallampati is class II. Tongue protrudes centrally and palate elevates symmetrically. Tonsils are absent. Neck circumference is 18 and three-quarter inches.  Chest: Clear to auscultation without wheezing, rhonchi or crackles noted.  Heart: S1+S2+0, regular and normal without murmurs, rubs or gallops noted.   Abdomen: Soft, non-tender and non-distended with normal bowel sounds appreciated on auscultation.  Extremities: There is trace pitting edema in Keith distal lower extremities.  Skin: Warm and dry without trophic changes noted.   Musculoskeletal: exam reveals b/l knee pain.    Neurologically:  Mental status: Keith [name] is awake, alert and oriented in all 4 spheres. His immediate and remote memory, attention, language skills and fund of knowledge are appropriate. There is no evidence of aphasia, agnosia, apraxia or anomia. Speech is clear with normal prosody and enunciation. Thought process is linear. Mood is normal and affect is normal. Cranial nerves II - XII are as described above under HEENT exam. In addition: shoulder shrug is normal with equal shoulder height noted. Motor exam: Normal bulk, strength and tone is noted. There is no tremor. Fine motor skills are grossly intact.  Cerebellar testing: No dysmetria or intention tremor. Sensory exam: intact to light touch in Keith UEs and LEs.  Gait, station and balance:  He stands With difficulty and pushes himself up.  He walks slowly and cautiously, no walking aid.    Assessment and Plan:  In summary, Keith [name]. is a 73 year old male with an underlying medical history of type 2 diabetes, osteoarthritis, hyperlipidemia, depression, allergic rhinitis, kidney stones, recurrent headaches, status post retinal detachment surgery, status post right, then left knee  replacements, status post TURP, and obesity, who presents for Reevaluation of his severe sleep apnea.  He has been on BiPAP ST.  He had sleep study testing in early 2015.  His machine is over 63 years old.  He has difficulty tolerating BiPAP ST, he has had some weight fluctuation.  I think it would make most sense to reevaluate him with a diagnostic sleep study and hopefully adjust his treatment after that.  He is agreeable.  To that end, I will place an order for sleep study testing.  We will hopefully schedule him soon.In Keith interim, he is advised to try to be consistent with his current machine and use it to get back on track with using a positive airway pressure device.  He is willing to do this.I plan to see him back after sleep study testing.  I answered all their questions today and Keith [name] and his wife were in agreement.

## 2019-07-03 NOTE — Patient Instructions (Signed)
Thank you for choosing [facility] for your sleep related care! It was nice to meet you today! I appreciate that you entrust me with your sleep related healthcare concerns. I hope, I was able to address at least some of your concerns today, and that I can help you feel reassured and also get better.    Here is what we discussed today and what we came up with as our plan for you:    Based on your symptoms and your exam I believe you are still at risk for obstructive sleep apnea and would benefit from reevaluation as it has been over 5 years and you have had some Weight fluctuation, you would be eligible for a new machine and perhaps we need to change your settings also. Therefore, I think we should proceed with a sleep study to determine how severe your sleep apnea is. If you have more than mild OSA, I want you to consider ongoing treatment with CPAP or BiPAP. Please remember, the risks and ramifications of moderate to severe obstructive sleep apnea or OSA are: Cardiovascular disease, including congestive heart failure, stroke, difficult to control hypertension, arrhythmias, and even type 2 diabetes has been linked to untreated OSA. Sleep apnea causes disruption of sleep and sleep deprivation in most cases, which, in turn, can cause recurrent headaches, problems with memory, mood, concentration, focus, and vigilance. Most people with untreated sleep apnea report excessive daytime sleepiness, which can affect their ability to drive. Please do not drive if you feel sleepy.   I will likely see you back after your sleep study to go over the test results and where to go from there. We will call you after your sleep study to advise about the results (most likely, you will hear from [place], my nurse) and to set up an appointment at the time, as necessary.    Our sleep lab administrative assistant will call you to schedule your sleep study. If you don't hear back from her by about 2 weeks from  now, please feel free to call her at [phone]. You can leave a message with your phone number and concerns, if you get the voicemail box. She will call back as soon as possible.

## 2019-07-17 ENCOUNTER — Ambulatory Visit (INDEPENDENT_AMBULATORY_CARE_PROVIDER_SITE_OTHER): Payer: Medicare Other | Admitting: Podiatry

## 2019-07-17 ENCOUNTER — Encounter: Payer: Self-pay | Admitting: Podiatry

## 2019-07-17 ENCOUNTER — Other Ambulatory Visit: Payer: Self-pay

## 2019-07-17 DIAGNOSIS — M79675 Pain in left toe(s): Secondary | ICD-10-CM | POA: Diagnosis not present

## 2019-07-17 DIAGNOSIS — M79674 Pain in right toe(s): Secondary | ICD-10-CM

## 2019-07-17 DIAGNOSIS — R600 Localized edema: Secondary | ICD-10-CM | POA: Diagnosis not present

## 2019-07-17 DIAGNOSIS — B351 Tinea unguium: Secondary | ICD-10-CM | POA: Diagnosis not present

## 2019-07-17 DIAGNOSIS — I8393 Asymptomatic varicose veins of bilateral lower extremities: Secondary | ICD-10-CM

## 2019-07-17 NOTE — Progress Notes (Signed)
Subjective: Keith [name]. is seen today for follow up painful, elongated, thickened toenails 1-5 b/l feet that he cannot cut. Pain interferes with daily activities. Aggravating factor includes wearing enclosed shoe gear and relieved with periodic debridement.  Current Outpatient Medications on File Prior to Visit  Medication Sig  . aspirin EC 81 MG tablet Take 81 mg by mouth daily.  Marland Kitchen atorvastatin (LIPITOR) 20 MG tablet Take 20 mg by mouth daily.  . Cholecalciferol (VITAMIN D) 2000 UNITS tablet Take 2,000 Units by mouth daily.  . clindamycin (CLEOCIN) 300 MG capsule   . clopidogrel (PLAVIX) 75 MG tablet Take 75 mg by mouth daily.  Marland Kitchen escitalopram (LEXAPRO) 20 MG tablet Take 20 mg by mouth daily.   . Investigational - Study Medication Inject 5 mg into the skin every Tuesday. Study name:Tirzepatide 5mg  subcutaneously once a week on Tuesdays Additional study [place] [facility] Study  . magnesium oxide (MAG-OX) 400 MG tablet Take 400 mg by mouth daily.  . methocarbamol (ROBAXIN) 500 MG tablet Take 1 tablet (500 mg total) by mouth every 6 (six) hours as needed for muscle spasms.  . metoprolol tartrate (LOPRESSOR) 25 MG tablet Take 25 mg by mouth 2 (two) times daily.  Marland Kitchen NITROSTAT 0.4 MG SL tablet Place 0.4 mg under the tongue every 5 (five) minutes as needed for chest pain.   . Omega-3 Fatty Acids (FISH OIL) 1000 MG CAPS Take 1,000 mg by mouth daily.  Marland Kitchen oxybutynin (DITROPAN-XL) 10 MG 24 hr tablet    No current facility-administered medications on file prior to visit.     Objective:  Vascular Examination: Capillary refill time immediate x 10 digits.  Dorsalis pedis present b/l.  Posterior tibial pulses present b/l.  Digital hair present b/l.  Skin temperature gradient WNL b/l.    +Varicosities b/l ankles. Trace edema b/l. No open wounds b/l.  Dermatological Examination: Skin with normal turgor, texture and tone b/l.  Toenails 1-5 b/l discolored,  thick, dystrophic with subungual debris and pain with palpation to nailbeds due to thickness of nails.  Musculoskeletal: Muscle strength 5/5 to all LE muscle groups b/l.  No gross bony deformities b/l.  No pain, crepitus or joint limitation noted with ROM.   Neurological Examination: Protective sensation intact with 10 gram monofilament bilaterally.  Epicritic sensation present bilaterally.  Vibratory sensation intact bilaterally.   Assessment: Painful onychomycosis toenails 1-5 b/l  Varicosities with trace edema b/l  Plan: 1. Toenails 1-5 b/l were debrided in length and girth without iatrogenic bleeding. 2. Discussed varicosities and edema. Rx written to [facility] for two pair of compression hose 20-30 mmHg, level knee high. Requested patient's legs be measured. He is to apply every morning and remove every evening. 3. Patient to continue soft, supportive shoe gear daily. 4. Patient to report any pedal injuries to medical professional immediately. 5. Follow up 3 months.  6. Patient/POA to call should there be a concern in the interim.

## 2019-07-28 ENCOUNTER — Ambulatory Visit (INDEPENDENT_AMBULATORY_CARE_PROVIDER_SITE_OTHER): Payer: Medicare Other | Admitting: Neurology

## 2019-07-28 DIAGNOSIS — E669 Obesity, unspecified: Secondary | ICD-10-CM

## 2019-07-28 DIAGNOSIS — R351 Nocturia: Secondary | ICD-10-CM

## 2019-07-28 DIAGNOSIS — G4733 Obstructive sleep apnea (adult) (pediatric): Secondary | ICD-10-CM | POA: Diagnosis not present

## 2019-07-28 DIAGNOSIS — R634 Abnormal weight loss: Secondary | ICD-10-CM

## 2019-07-28 DIAGNOSIS — G4719 Other hypersomnia: Secondary | ICD-10-CM

## 2019-07-28 DIAGNOSIS — G4720 Circadian rhythm sleep disorder, unspecified type: Secondary | ICD-10-CM

## 2019-07-28 DIAGNOSIS — G4731 Primary central sleep apnea: Secondary | ICD-10-CM

## 2019-07-28 DIAGNOSIS — G4761 Periodic limb movement disorder: Secondary | ICD-10-CM

## 2019-07-28 DIAGNOSIS — R519 Headache, unspecified: Secondary | ICD-10-CM

## 2019-07-28 DIAGNOSIS — R9431 Abnormal electrocardiogram [ECG] [EKG]: Secondary | ICD-10-CM

## 2019-08-12 ENCOUNTER — Telehealth: Payer: Self-pay | Admitting: Neurology

## 2019-08-12 NOTE — Telephone Encounter (Signed)
Pt states it has been about 2 weeks since his sleep study, he would like a call with the results.

## 2019-08-13 ENCOUNTER — Encounter: Payer: Self-pay | Admitting: Neurology

## 2019-08-13 ENCOUNTER — Telehealth: Payer: Self-pay | Admitting: Neurology

## 2019-08-13 NOTE — Progress Notes (Signed)
Patient referred by Dr. Philip Aspen for re-eval of his sleep apnea, last seen by me on 07/03/19, diagnostic PSG on 07/28/19.   Please call and notify the patient that the recent sleep study showed severe central and obstructive sleep apnea. He will likely need ongoing treatment with BiPAP. Please reinforce compliance with his current machine and I recommend a titration study for treatment. This will require a repeat sleep study for proper titration and mask fitting and correct monitoring of the oxygen saturations. Please explain to patient. I have placed an order in the chart.  Also, there were frequent PVCs, ie extra beats on EKG. I would like for him to FU with PCP for a proper EKG and to discuss, whether he should see a cardiologist. Please copy PCP on the sleep study report too.  Thanks.  Star Age, MD, PhD [facility])

## 2019-08-13 NOTE — Telephone Encounter (Signed)
Attempted to call the patient 3-4 times and the phone would hang up before I could answer. I am writing the patient a mychart message with his sleep study results and if the patient calls back I will be happy to review with him.

## 2019-08-13 NOTE — Procedures (Signed)
PATIENT'S NAME:  Keith [name], Keith [name]. DOB:      [birth year]      MR#:    [id number]     DATE OF RECORDING: 07/28/2019 REFERRING M.D.:  Leanna Battles, M.D Study Performed:   Baseline Polysomnogram HISTORY: 73 year old man with a history of type 2 diabetes, osteoarthritis, hyperlipidemia, depression, allergic rhinitis, kidney stones, recurrent headaches, status post retinal detachment surgery, status post right, then left knee replacements, status post TURP, and obesity, who has been on BiPAP ST for his sleep apnea for years.  He had sleep study testing through our office on 04/29/2014 which was a split-night sleep study and a subsequent titration study in September 2015. He has difficulty tolerating his BiPAP. He has not been fully compliant. Weight has fluctuated. The patient endorsed the Epworth Sleepiness Scale at 9/24 points. The patient's weight 267 pounds with a height of 72 (inches), resulting in a BMI of 36.1 kg/m2. The patient's neck circumference measured 18.8 inches.  CURRENT MEDICATIONS: Aspirin, Lipitor, Vitamin D, Cleocin, Plavix, Lexapro, Mag-ox, Robaxin, Lopressor, Nitrostat, Omega-3, Ditropan-XL.   PROCEDURE:  This is a multichannel digital polysomnogram utilizing the Somnostar 11.2 system.  Electrodes and sensors were applied and monitored per AASM Specifications.   EEG, EOG, Chin and Limb EMG, were sampled at 200 Hz.  ECG, Snore and Nasal Pressure, Thermal Airflow, Respiratory Effort, CPAP Flow and Pressure, Oximetry was sampled at 50 Hz. Digital video and audio were recorded.      BASELINE STUDY  Lights Out was at 20:56 and Lights On at 05:00.  Total recording time (TRT) was 484 minutes, with a total sleep time (TST) of 420.5 minutes.   The patient's sleep latency was 14 minutes.  REM latency was 445.5 minutes, which is markedly delayed. The sleep efficiency was 86.9 %.     SLEEP ARCHITECTURE: WASO (Wake after sleep onset) was 49 minutes with mild to moderate sleep fragmentation  noted. There were 12 minutes in Stage N1, 381 minutes Stage N2, 22.5 minutes Stage N3 and 5 minutes in Stage REM.  The percentage of Stage N1 was 2.9%, Stage N2 was 90.6%, which is markedly increased, Stage N3 was 5.4% and Stage R (REM sleep) was 1.2%, which is markedly reduced. The arousals were noted as: 107 were spontaneous, 88 were associated with PLMs, 235 were associated with respiratory events.  RESPIRATORY ANALYSIS:  There were a total of 334 respiratory events:  61 obstructive apneas, 206 central apneas and 1 mixed apneas with a total of 268 apneas and an apnea index (AI) of 38.2 /hour. There were 66 hypopneas with a hypopnea index of 9.4 /hour. The patient also had 0 respiratory event related arousals (RERAs).      The total APNEA/HYPOPNEA INDEX (AHI) was 47.7/hour and the total RESPIRATORY DISTURBANCE INDEX was 47.7 /hour.  7 events occurred in REM sleep and 333 events in NREM. The REM AHI was 84 /hour, versus a non-REM AHI of 47.2. The patient spent 190 minutes of total sleep time in the supine position and 231 minutes in non-supine.. The supine AHI was 52.4 versus a non-supine AHI of 43.7.  OXYGEN SATURATION & C02:  The Wake baseline 02 saturation was 94%, with the lowest being 79%. Time spent below 89% saturation equaled 78 minutes.  PERIODIC LIMB MOVEMENTS: The patient had a total of 351 Periodic Limb Movements.  The Periodic Limb Movement (PLM) index was 50.1 and the PLM Arousal index was 12.6/hour.  Audio and video analysis did not show any abnormal or  unusual movements, behaviors, phonations or vocalizations. The patient took 1 bathroom break. Mild to moderate snoring was noted. The EKG showed frequent PVCs with bigeminy noted.   Post-study, the patient indicated that sleep was worse than usual.   IMPRESSION:  1. Primary central sleep apnea 2. Obstructive Sleep Apnea (OSA) 3. Periodic Limb Movement Disorder (PLMD) 4. Dysfunctions associated with sleep stages or arousal from  sleep 5. Non-specific abnormal EKG  RECOMMENDATIONS:  1. This study demonstrates severe central and obstructive sleep apnea, with a primary central component, total AHI of 47.7/hour, O2 nadir of 79%. Treatment with positive airway pressure is recommended and likely will require ongoing BiPAP, or BiPAP ST. The patient will be advised to return for a full night BiPAP titration study. He will be encouraged to use his current BiPAP ST. Other treatment options will be limited, given his central sleep apnea.     2. Please note that untreated obstructive sleep apnea may carry additional perioperative morbidity. Patients with significant obstructive sleep apnea should receive perioperative PAP therapy and the surgeons and particularly the anesthesiologist should be informed of the diagnosis and the severity of the sleep disordered breathing. 3. This study shows sleep fragmentation and abnormal sleep stage percentages; these are nonspecific findings and per se do not signify an intrinsic sleep disorder or a cause for the patient's sleep-related symptoms. Causes include (but are not limited to) the first night effect of the sleep study, circadian rhythm disturbances, medication effect or an underlying mood disorder or medical problem.  4. The patient should be cautioned not to drive, work at heights, or operate dangerous or heavy equipment when tired or sleepy. Review and reiteration of good sleep hygiene measures should be pursued with any patient. 5. Severe PLMs (periodic limb movements of sleep) were noted during this study with mild arousals; clinical correlation is recommended. Medication effect from the antidepressant medication should be considered.  6. The study showed frequent PVCs on single lead EKG; clinical correlation is recommended and consultation with cardiology may be feasible.  7. The patient will be seen in follow-up in the sleep clinic at [facility] for discussion of the test results, symptom and  treatment compliance review, further management strategies, etc. The referring provider will be notified of the test results.  I certify that I have reviewed the entire raw data recording prior to the issuance of this report in accordance with the Standards of Accreditation of the American Academy of Sleep Medicine (AASM)    Star Age, MD, PhD Diplomat, American Board of Neurology and Sleep Medicine (Neurology and Sleep Medicine)

## 2019-08-13 NOTE — Addendum Note (Signed)
Addended by: Star Age on: 08/13/2019 08:29 AM   Modules accepted: Orders

## 2019-08-13 NOTE — Telephone Encounter (Signed)
-----   Message from Star Age, MD sent at 08/13/2019  8:29 AM EST ----- Patient referred by Dr. Philip Aspen for re-eval of his sleep apnea, last seen by me on 07/03/19, diagnostic PSG on 07/28/19.   Please call and notify the patient that the recent sleep study showed severe central and obstructive sleep apnea. He will likely need ongoing treatment with BiPAP. Please reinforce compliance with his current machine and I recommend a titration study for treatment. This will require a repeat sleep study for proper titration and mask fitting and correct monitoring of the oxygen saturations. Please explain to patient. I have placed an order in the chart.  Also, there were frequent PVCs, ie extra beats on EKG. I would like for him to FU with PCP for a proper EKG and to discuss, whether he should see a cardiologist. Please copy PCP on the sleep study report too.  Thanks.  Star Age, MD, PhD [facility])

## 2019-08-14 ENCOUNTER — Telehealth: Payer: Self-pay | Admitting: *Deleted

## 2019-08-14 NOTE — Telephone Encounter (Signed)
-----   Message from Star Age, MD sent at 08/13/2019  8:29 AM EST ----- Patient referred by Dr. Philip Aspen for re-eval of his sleep apnea, last seen by me on 07/03/19, diagnostic PSG on 07/28/19.   Please call and notify the patient that the recent sleep study showed severe central and obstructive sleep apnea. He will likely need ongoing treatment with BiPAP. Please reinforce compliance with his current machine and I recommend a titration study for treatment. This will require a repeat sleep study for proper titration and mask fitting and correct monitoring of the oxygen saturations. Please explain to patient. I have placed an order in the chart.  Also, there were frequent PVCs, ie extra beats on EKG. I would like for him to FU with PCP for a proper EKG and to discuss, whether he should see a cardiologist. Please copy PCP on the sleep study report too.  Thanks.  Star Age, MD, PhD [facility])

## 2019-08-14 NOTE — Telephone Encounter (Signed)
I relayed the sleep study results to pt. He is ok to proceed with titration study.  Authorization to be done and then will be called to set up.  Faxed note to Dr. Conley Rolls, re: EKG, PVC's.  Pt was made aware.

## 2019-09-10 NOTE — Telephone Encounter (Signed)
Patient is calling in wanting to know if his results/and office notes faxed to Dr [name] by Monday 09/16/2019.

## 2019-09-10 NOTE — Telephone Encounter (Signed)
I have routed the sleep study electronically to Dr [name] office. Also they have access and should be able to see in epic.

## 2019-09-14 ENCOUNTER — Inpatient Hospital Stay (HOSPITAL_COMMUNITY): Admission: RE | Admit: 2019-09-14 | Payer: Self-pay | Source: Ambulatory Visit

## 2019-09-16 ENCOUNTER — Ambulatory Visit: Payer: Self-pay | Admitting: Cardiology

## 2019-09-16 NOTE — Progress Notes (Deleted)
Primary Physician:  Leanna Battles, MD   Patient ID: Keith Drain., male    DOB: 04-Jun-1946, 74 y.o.   MRN: [id number]  Subjective:    No chief complaint on file.   HPI: Keith [name].  is a 74 y.o. male  with ***  Past Medical History:  Diagnosis Date  . Anginal pain ([place])   . Anxiety    takes Celexa  . Arthritis    bil knees  . Benign prostatic hypertrophy    takes Flomax daily  . Bladder stones 06/25/2013   1.9cm bladder stone.  Marland Kitchen BPH with urinary obstruction 06/25/2013   127cc prostate with large middle lobe and bladder stones.   . Complication of anesthesia    hard to wake up  . Depression   . Diabetes mellitus without complication ([place])   . ED (erectile dysfunction)   . Hyperlipidemia   . Hypertension   . PONV (postoperative nausea and vomiting)   . Sleep apnea    uses bipap machine  . Spinal headache    reports last spinal headache was yesterday 05-16-18, lasted for 2 hours a, relief with tylenol , denies vision change with yesterdays occurrence but wife endorses vision changes with past spinal headaches    . Type 2 DM with CKD stage 3 and hypertension ([place])     Past Surgical History:  Procedure Laterality Date  . CARDIAC CATHETERIZATION  06-15-14  . CATARACT EXTRACTION W/ INTRAOCULAR LENS  IMPLANT, BILATERAL  03/13/2003  . COLONOSCOPY    . CYSTOSCOPY WITH LITHOLAPAXY N/A 06/25/2013   Procedure: CYSTOSCOPY WITH LITHOLAPAXY;  Surgeon: [name], MD;  Location: [facility];  Service: Urology;  Laterality: N/A;  . KNEE ARTHROSCOPY  09/07/2010   left knee  . KNEE ARTHROSCOPY  05/27/2000   left knee  . LEFT HEART CATHETERIZATION WITH CORONARY ANGIOGRAM N/A 06/17/2014   Procedure: LEFT HEART CATHETERIZATION WITH CORONARY ANGIOGRAM;  Surgeon: Laverda Page, MD;  Location: [facility] CATH LAB;  Service: Cardiovascular;  Laterality: N/A;  . RETINAL DETACHMENT REPAIR W/ SCLERAL BUCKLE LE  10/14/2002   Dr Zadie Rhine  . TONSILLECTOMY     age 49  . TOTAL KNEE  ARTHROPLASTY  08/01/2011   Procedure: TOTAL KNEE ARTHROPLASTY;  Surgeon: Lorn Junes, MD;  Location: [place];  Service: Orthopedics;  Laterality: Right;  TOTAL KNEE ARTHROPLASTY  RIGHT SIDE  . TOTAL KNEE ARTHROPLASTY Left 05/22/2018   Procedure: LEFT TOTAL KNEE ARTHROPLASTY;  Surgeon: Paralee Cancel, MD;  Location: [facility];  Service: Orthopedics;  Laterality: Left;  . TRANSURETHRAL RESECTION OF PROSTATE N/A 06/25/2013   Procedure: TRANSURETHRAL RESECTION OF THE PROSTATE WITH GYRUS INSTRUMENTS;  Surgeon: [name], MD;  Location: [facility];  Service: Urology;  Laterality: N/A;  . VASECTOMY  1982    Social History   Socioeconomic History  . Marital status: Married    Spouse name: Not on file  . Number of children: Not on file  . Years of education: Not on file  . Highest education level: Not on file  Occupational History  . Not on file  Tobacco Use  . Smoking status: Never Smoker  . Smokeless tobacco: Never Used  Substance and Sexual Activity  . Alcohol use: Yes    Alcohol/week: 0.0 standard drinks    Comment: rare   . Drug use: No  . Sexual activity: Not on file  Other Topics Concern  . Not on file  Social History Narrative   Right handed.  Social Determinants of Health   Financial Resource Strain:   . Difficulty of Paying Living Expenses: Not on file  Food Insecurity:   . Worried About Charity fundraiser in the Last Year: Not on file  . Ran Out of Food in the Last Year: Not on file  Transportation Needs:   . Lack of Transportation (Medical): Not on file  . Lack of Transportation (Non-Medical): Not on file  Physical Activity:   . Days of Exercise per Week: Not on file  . Minutes of Exercise per Session: Not on file  Stress:   . Feeling of Stress : Not on file  Social Connections:   . Frequency of Communication with Friends and Family: Not on file  . Frequency of Social Gatherings with Friends and Family: Not on file  . Attends Religious Services: Not on file  .  Active Member of Clubs or Organizations: Not on file  . Attends Archivist Meetings: Not on file  . Marital Status: Not on file  Intimate Partner Violence:   . Fear of Current or Ex-Partner: Not on file  . Emotionally Abused: Not on file  . Physically Abused: Not on file  . Sexually Abused: Not on file    ROS    Objective:  There were no vitals taken for this visit. There is no height or weight on file to calculate BMI.    Physical Exam Radiology: No results found.  Laboratory examination:   *** CMP Latest Ref Rng & Units 05/23/2018 05/17/2018 04/17/2018  Glucose 70 - 99 mg/dL 116(H) 112(H) -  BUN 8 - 23 mg/dL 37(H) 29(H) -  Creatinine 0.61 - 1.24 mg/dL 1.97(H) 2.23(H) -  Sodium 135 - 145 mmol/L 139 137 -  Potassium 3.5 - 5.1 mmol/L 4.2 4.7 -  Chloride 98 - 111 mmol/L 108 103 -  CO2 22 - 32 mmol/L 22 25 -  Calcium 8.9 - 10.3 mg/dL 8.4(L) 9.0 -  Total Protein 6.1 - 8.1 g/dL - - 6.8  Total Bilirubin 0.2 - 1.2 mg/dL - - 0.7  Alkaline Phos 39 - 117 U/L - - -  AST 10 - 35 U/L - - 12  ALT 9 - 46 U/L - - 10   CBC Latest Ref Rng & Units 05/23/2018 05/17/2018 08/05/2015  WBC 4.0 - 10.5 K/uL 15.2(H) 8.1 9.3  Hemoglobin 13.0 - 17.0 g/dL 10.8(L) 14.1 12.5(L)  Hematocrit 39.0 - 52.0 % 32.5(L) 42.5 38.1(L)  Platelets 150 - 400 K/uL 218 287 239   Lipid Panel     Component Value Date/Time   CHOL 119 11/12/2009 0856   TRIG 75.0 11/12/2009 0856   TRIG 118 06/29/2006 1325   HDL 47.00 11/12/2009 0856   CHOLHDL 3 11/12/2009 0856   VLDL 15.0 11/12/2009 0856   LDLCALC 57 11/12/2009 0856   LDLDIRECT 141.0 01/30/2007 1455   HEMOGLOBIN A1C Lab Results  Component Value Date   HGBA1C 6.0 (H) 05/17/2018   MPG 125.5 05/17/2018   TSH No results for input(s): TSH in the last 8760 hours.  PRN Meds:. There are no discontinued medications. No outpatient medications have been marked as taking for the 09/16/19 encounter (Appointment) with Miquel Dunn, NP.    Cardiac Studies:    ***  Assessment:   No diagnosis found.  ***  Recommendations:   ***  Miquel Dunn, MSN, APRN, FNP-C [facility]. [place] Office: [phone] Fax: [phone]

## 2019-09-17 ENCOUNTER — Ambulatory Visit (INDEPENDENT_AMBULATORY_CARE_PROVIDER_SITE_OTHER): Payer: Medicare Other | Admitting: Neurology

## 2019-09-17 DIAGNOSIS — R634 Abnormal weight loss: Secondary | ICD-10-CM

## 2019-09-17 DIAGNOSIS — G4761 Periodic limb movement disorder: Secondary | ICD-10-CM

## 2019-09-17 DIAGNOSIS — R9431 Abnormal electrocardiogram [ECG] [EKG]: Secondary | ICD-10-CM

## 2019-09-17 DIAGNOSIS — R519 Headache, unspecified: Secondary | ICD-10-CM

## 2019-09-17 DIAGNOSIS — G4731 Primary central sleep apnea: Secondary | ICD-10-CM

## 2019-09-17 DIAGNOSIS — G4733 Obstructive sleep apnea (adult) (pediatric): Secondary | ICD-10-CM

## 2019-09-17 DIAGNOSIS — R351 Nocturia: Secondary | ICD-10-CM

## 2019-09-17 DIAGNOSIS — G4720 Circadian rhythm sleep disorder, unspecified type: Secondary | ICD-10-CM

## 2019-09-17 DIAGNOSIS — E669 Obesity, unspecified: Secondary | ICD-10-CM

## 2019-09-17 DIAGNOSIS — G4719 Other hypersomnia: Secondary | ICD-10-CM

## 2019-09-19 ENCOUNTER — Other Ambulatory Visit: Payer: Self-pay

## 2019-09-19 ENCOUNTER — Ambulatory Visit (INDEPENDENT_AMBULATORY_CARE_PROVIDER_SITE_OTHER): Payer: Medicare Other | Admitting: Cardiology

## 2019-09-19 ENCOUNTER — Encounter: Payer: Self-pay | Admitting: Cardiology

## 2019-09-19 VITALS — BP 128/73 | HR 69 | Temp 98.2°F | Ht 73.0 in | Wt 268.8 lb

## 2019-09-19 DIAGNOSIS — E669 Obesity, unspecified: Secondary | ICD-10-CM | POA: Diagnosis not present

## 2019-09-19 DIAGNOSIS — G4731 Primary central sleep apnea: Secondary | ICD-10-CM | POA: Diagnosis not present

## 2019-09-19 DIAGNOSIS — I493 Ventricular premature depolarization: Secondary | ICD-10-CM

## 2019-09-19 DIAGNOSIS — I10 Essential (primary) hypertension: Secondary | ICD-10-CM

## 2019-09-19 DIAGNOSIS — E119 Type 2 diabetes mellitus without complications: Secondary | ICD-10-CM | POA: Diagnosis not present

## 2019-09-19 NOTE — Progress Notes (Signed)
Primary Physician:  Leanna Battles, MD   Patient ID: Keith Drain., male    DOB: [birth year], 74 y.o.   MRN: [id number]  Subjective:    Chief Complaint  Patient presents with  . ABN Sleep Study  . Follow-up    HPI: Keith [name].  is a 74 y.o. male  with HTN, HLD, DM type 2, central sleep apnea, and stage 3 CKD, last seen by [place] in 2019 for pre-op clearance due to frequent PVC's  Patient was last evaluated by [place] in 2015 with small area of ischemia by stress testing and was called high risk study due to decreased LVEF; however, echocardiogram showed normal EF and no wall motion abnormalities. Underwent coronary angiogram also at that time revealing normal coronary arteries.   He recently underwent sleep study that showed frequent PVC's, and has been referred back to [place] for follow up. He denies any palpitations or heart racing. States that he continues to have occasional chest pain on exertion with going up a flight of stairs that he states has been occurring since 2015. No increased frequency or changes. Does not occur daily with exertion. No shortness of breath or leg edema. He is trying to maintain a stable weight, but is having difficulty with this due to the gyms being closed.     Past Medical History:  Diagnosis Date  . Anginal pain ([place])   . Anxiety    takes Celexa  . Arthritis    bil knees  . Benign prostatic hypertrophy    takes Flomax daily  . Bladder stones 06/25/2013   1.9cm bladder stone.  Marland Kitchen BPH with urinary obstruction 06/25/2013   127cc prostate with large middle lobe and bladder stones.   . Complication of anesthesia    hard to wake up  . Depression   . Diabetes mellitus without complication ([place])   . ED (erectile dysfunction)   . Hyperlipidemia   . Hypertension   . PONV (postoperative nausea and vomiting)   . Sleep apnea    uses bipap machine  . Spinal headache    reports last spinal headache was yesterday 05-16-18, lasted for 2 hours a, relief  with tylenol , denies vision change with yesterdays occurrence but wife endorses vision changes with past spinal headaches    . Type 2 DM with CKD stage 3 and hypertension ([place])     Past Surgical History:  Procedure Laterality Date  . CARDIAC CATHETERIZATION  06-15-14  . CATARACT EXTRACTION W/ INTRAOCULAR LENS  IMPLANT, BILATERAL  03/13/2003  . COLONOSCOPY    . CYSTOSCOPY WITH LITHOLAPAXY N/A 06/25/2013   Procedure: CYSTOSCOPY WITH LITHOLAPAXY;  Surgeon: [name], MD;  Location: [facility];  Service: Urology;  Laterality: N/A;  . KNEE ARTHROSCOPY  09/07/2010   left knee  . KNEE ARTHROSCOPY  05/27/2000   left knee  . LEFT HEART CATHETERIZATION WITH CORONARY ANGIOGRAM N/A 06/17/2014   Procedure: LEFT HEART CATHETERIZATION WITH CORONARY ANGIOGRAM;  Surgeon: Laverda Page, MD;  Location[facility];  Service: Cardiovascular;  Laterality: N/A;  . RETINAL DETACHMENT REPAIR W/ SCLERAL BUCKLE LE  10/14/2002   Dr Zadie Rhine  . TONSILLECTOMY     age 66  . TOTAL KNEE ARTHROPLASTY  08/01/2011   Procedure: TOTAL KNEE ARTHROPLASTY;  Surgeon: Lorn Junes, MD;  Location: [facility];  Service: Orthopedics;  Laterality: Right;  TOTAL KNEE ARTHROPLASTY  RIGHT SIDE  . TOTAL KNEE ARTHROPLASTY Left 05/22/2018   Procedure: LEFT TOTAL KNEE ARTHROPLASTY;  Surgeon: Paralee Cancel, MD;  Location: [facility];  Service: Orthopedics;  Laterality: Left;  . TRANSURETHRAL RESECTION OF PROSTATE N/A 06/25/2013   Procedure: TRANSURETHRAL RESECTION OF THE PROSTATE WITH GYRUS INSTRUMENTS;  Surgeon: [name], MD;  Location: [facility];  Service: Urology;  Laterality: N/A;  . VASECTOMY  1982      Tobacco Use: Low Risk   . Smoking Tobacco Use: Never Smoker  . Smokeless Tobacco Use: Never Used     Review of Systems  Constitution: Negative for decreased appetite, malaise/fatigue, weight gain and weight loss.  Eyes: Negative for visual disturbance.  Cardiovascular: Positive for chest pain (occasional with exertion; stable since  2015). Negative for claudication, dyspnea on exertion, leg swelling, orthopnea, palpitations and syncope.  Respiratory: Negative for hemoptysis and wheezing.   Endocrine: Negative for cold intolerance and heat intolerance.  Hematologic/Lymphatic: Does not bruise/bleed easily.  Skin: Negative for nail changes.  Musculoskeletal: Negative for muscle weakness and myalgias.  Gastrointestinal: Negative for abdominal pain, change in bowel habit, nausea and vomiting.  Neurological: Negative for difficulty with concentration, dizziness, focal weakness and headaches.  Psychiatric/Behavioral: Negative for altered mental status and suicidal ideas.  All other systems reviewed and are negative.     Objective:  Blood pressure 128/73, pulse 69, temperature 98.2 F (36.8 C), height '6\' 1"'  (1.854 m), weight 268 lb 12.8 oz (121.9 kg), SpO2 94 %. Body mass index is 35.46 kg/m.    Physical Exam  Constitutional: He is oriented to person, place, and time. Vital signs are normal. He appears well-developed and well-nourished.  Well built and morbidly obese  HENT:  Head: Normocephalic and atraumatic.  Cardiovascular: Normal rate, regular rhythm, normal heart sounds and intact distal pulses.  Pulses:      Femoral pulses are 1+ on the right side and 1+ on the left side.      Popliteal pulses are 1+ on the right side and 1+ on the left side.       Dorsalis pedis pulses are 2+ on the right side and 2+ on the left side.       Posterior tibial pulses are 2+ on the right side and 2+ on the left side.  No edema  Pulmonary/Chest: Effort normal and breath sounds normal. No accessory muscle usage. No respiratory distress.  Abdominal: Soft. Bowel sounds are normal.  Musculoskeletal:        General: Normal range of motion.     Cervical back: Normal range of motion.  Neurological: He is alert and oriented to person, place, and time.  Skin: Skin is warm and dry.  Vitals reviewed.  Radiology: No results  found.  Laboratory examination:    CMP Latest Ref Rng & Units 05/23/2018 05/17/2018 04/17/2018  Glucose 70 - 99 mg/dL 116(H) 112(H) -  BUN 8 - 23 mg/dL 37(H) 29(H) -  Creatinine 0.61 - 1.24 mg/dL 1.97(H) 2.23(H) -  Sodium 135 - 145 mmol/L 139 137 -  Potassium 3.5 - 5.1 mmol/L 4.2 4.7 -  Chloride 98 - 111 mmol/L 108 103 -  CO2 22 - 32 mmol/L 22 25 -  Calcium 8.9 - 10.3 mg/dL 8.4(L) 9.0 -  Total Protein 6.1 - 8.1 g/dL - - 6.8  Total Bilirubin 0.2 - 1.2 mg/dL - - 0.7  Alkaline Phos 39 - 117 U/L - - -  AST 10 - 35 U/L - - 12  ALT 9 - 46 U/L - - 10   CBC Latest Ref Rng & Units 05/23/2018 05/17/2018 08/05/2015  WBC 4.0 - 10.5 K/uL 15.2(H) 8.1 9.3  Hemoglobin 13.0 - 17.0 g/dL 10.8(L) 14.1 12.5(L)  Hematocrit 39.0 - 52.0 % 32.5(L) 42.5 38.1(L)  Platelets 150 - 400 K/uL 218 287 239   Lipid Panel     Component Value Date/Time   CHOL 119 11/12/2009 0856   TRIG 75.0 11/12/2009 0856   TRIG 118 06/29/2006 1325   HDL 47.00 11/12/2009 0856   CHOLHDL 3 11/12/2009 0856   VLDL 15.0 11/12/2009 0856   LDLCALC 57 11/12/2009 0856   LDLDIRECT 141.0 01/30/2007 1455   HEMOGLOBIN A1C Lab Results  Component Value Date   HGBA1C 6.0 (H) 05/17/2018   MPG 125.5 05/17/2018   TSH No results for input(s): TSH in the last 8760 hours.  PRN Meds:. Medications Discontinued During This Encounter  Medication Reason  . metoprolol tartrate (LOPRESSOR) 25 MG tablet Discontinued by provider  . clopidogrel (PLAVIX) 75 MG tablet Discontinued by provider   Current Meds  Medication Sig  . aspirin EC 81 MG tablet Take 81 mg by mouth daily.  Marland Kitchen atorvastatin (LIPITOR) 20 MG tablet Take 20 mg by mouth daily.  . Cholecalciferol (VITAMIN D) 2000 UNITS tablet Take 2,000 Units by mouth daily.  . clindamycin (CLEOCIN) 300 MG capsule daily.   Marland Kitchen escitalopram (LEXAPRO) 20 MG tablet Take 20 mg by mouth daily.   . finasteride (PROSCAR) 5 MG tablet Take 5 mg by mouth daily.  . Investigational - Study Medication Inject 5 mg into  the skin every Tuesday. Study name:Tirzepatide 69m subcutaneously once a week on Tuesdays Additional study dManchesterDrug Study  . magnesium oxide (MAG-OX) 400 MG tablet Take 400 mg by mouth daily.  . methocarbamol (ROBAXIN) 500 MG tablet Take 1 tablet (500 mg total) by mouth every 6 (six) hours as needed for muscle spasms.  .Marland KitchenNITROSTAT 0.4 MG SL tablet Place 0.4 mg under the tongue every 5 (five) minutes as needed for chest pain.   . Omega-3 Fatty Acids (FISH OIL) 1000 MG CAPS Take 1,000 mg by mouth daily.  .Marland Kitchenoxybutynin (DITROPAN-XL) 10 MG 24 hr tablet daily.   . [DISCONTINUED] clopidogrel (PLAVIX) 75 MG tablet Take 75 mg by mouth daily.    Cardiac Studies:   Coronary Angiogram  [06/17/2014]: Mild noncritical coronary artery disease involving 10-20% stenosis, mid rca 20%, normal LV systolic function.  Echocardiogram  [05/13/2014]: - Left ventricle cavity is normal in size. Mild concentric hypertrophy of the left ventricle. Normal global wall motion. Syst. function is borderline normal. Visual EF is 50-55%. Doppler evidence of grade I (impaired) diastolic dysfunction. Left ventricle regional wall motion findings: No wall motion abnormalities. - Left atrial cavity is mildly dilated. - Structurally normal mitral valve with trace regurgitation. - Mild tricuspid regurgitation. No evidence of pulmonary  Nuclear stress test  [04/21/2014]: 1. Resting EKG demonstrates normal sinus rhythm, incomplete right bundle branch block, poor R-wave progression. Stress ECG negative for myocardial ischemia. Patient exercised for 5 minutes and 32 seconds and achieved 7.3 METS. Stress test terminated due to target heart rate met, patient developed mild dyspnea. 2. Perfusion imaging study demonstrates a medium size inferior, inferoapical and apical anterior ischemia extending from the mid ventricle. Dynamic QGS imaging reveal mild inferior wall hypokinesis. Left ventricular  systolic function calculated by QGS was markedly depressed at 36%. Although the ischemic burden was small, this represents a high risk study due to depressed ejection fraction. Multivessel coronary artery disease cannot be completely excluded. Clinical correlation is recommended.  ABI's [05/14/2014]: Normal bilateral ABI at 1.11 with normal triphasic waveform at the level of the ankle.  Assessment:   Frequent PVCs - Plan: EKG 12-Lead, PCV ECHOCARDIOGRAM COMPLETE  Primary hypertension - Plan: PCV ECHOCARDIOGRAM COMPLETE  Central sleep apnea  Type 2 diabetes mellitus without complication, without long-term current use of insulin (HCC)  Obesity (BMI 30-39.9)  EKG 09/19/2019: Normal sinus rhythm at 66 bpm with first degree AV block, left axis deviation, left anterior fasicular block, poor R-wave progression cannot exclude anterior infarct old. IVCD. No evidence of ischemia. No change compared to EKG 05/2018  Recommendations:   Patient presents for follow-up for frequent PVCs that were noted on EKG during recent sleep study.  He is not noted to have PVCs on EKG today.  He has history of PVCs in the past.  He is asymptomatic in regards to this.  I will obtain echocardiogram to exclude any structural abnormalities.    Since last seen by [place] in September 2019, he states that he is doing well without any significant complaints.  He continues to have occasional exertional chest pain with climbing stairs that he states is stable.  Does not occur each time he exerts himself.  He is trying to exercise, but has difficulty with this presently due to gym closure.  His blood pressure is well controlled.  He states that he is due for physical exam with his PCP and will have lipids performed at that time for follow-up.  States his diabetes is stable. He will continue to follow up with Neurology for sleep apnea. I have encouraged him to continue to work on efforts towards weight loss with dietary modifications to  help reduce his risk.    He is found to be on both aspirin and Plavix.  Do not see any clear indication for Plavix at this time, will discontinue.  Continue with aspirin therapy.  Unless echocardiogram is markedly abnormal, we will see him back on a as needed basis.  Keith Dunn, MSN, APRN, FNP-C [facility]. [place] Office: [phone] Fax: [phone]

## 2019-09-25 ENCOUNTER — Telehealth: Payer: Self-pay

## 2019-09-25 ENCOUNTER — Other Ambulatory Visit: Payer: Self-pay

## 2019-09-25 ENCOUNTER — Ambulatory Visit (INDEPENDENT_AMBULATORY_CARE_PROVIDER_SITE_OTHER): Payer: Medicare Other

## 2019-09-25 ENCOUNTER — Encounter: Payer: Self-pay | Admitting: Neurology

## 2019-09-25 DIAGNOSIS — I10 Essential (primary) hypertension: Secondary | ICD-10-CM

## 2019-09-25 DIAGNOSIS — I493 Ventricular premature depolarization: Secondary | ICD-10-CM

## 2019-09-25 NOTE — Telephone Encounter (Signed)
Error

## 2019-09-25 NOTE — Addendum Note (Signed)
Addended by: Star Age on: 09/25/2019 07:57 AM   Modules accepted: Orders

## 2019-09-25 NOTE — Telephone Encounter (Signed)
-----   Message from Star Age, MD sent at 09/25/2019  7:57 AM EST ----- Patient referred by Dr. Philip Aspen for re-eval of his sleep apnea, last seen by me on 07/03/19, diagnostic PSG on 07/28/19, PAP titration study on 09/17/19.  Please call and inform patient that I have entered an order for treatment with positive airway pressure (PAP) treatment for his central and obstructive sleep apnea (OSA). He did well during the latest sleep study with BiPAP ST, which he has been on for years, he may be eligible for a new machine. We will, therefore, arrange for a machine for home use through a DME (durable medical equipment) company of His choice; and I will see the patient back in follow-up in about 10 weeks. Please also explain to the patient that I will be looking out for compliance data, which can be downloaded from the machine (stored on an SD card, that is inserted in the machine) or via remote access through a modem, that is built into the machine. At the time of the followup appointment we will discuss sleep study results and how it is going with PAP treatment at home. Please advise patient to bring His machine at the time of the first FU visit, even though this is cumbersome. Bringing the machine for every visit after that will likely not be needed, but often helps for the first visit to troubleshoot if needed. Please re-enforce the importance of compliance with treatment and the need for [place] to monitor compliance data - often an insurance requirement and actually good feedback for the patient as far as how they are doing.  Also remind patient, that any interim PAP machine or mask issues should be first addressed with the DME company, as they can often help better with technical and mask fit issues. Please ask if patient has a preference regarding DME company.  Please also make sure, the patient has a follow-up appointment with me in about 10 weeks from the setup date, thanks. May see one of our nurse practitioners  if needed for proper timing of the FU appointment.  Please fax or rout report to the referring provider. Thanks,   Star Age, MD, PhD [facility])

## 2019-09-25 NOTE — Progress Notes (Signed)
Patient referred by Dr. Philip Aspen for re-eval of his sleep apnea, last seen by me on 07/03/19, diagnostic PSG on 07/28/19, PAP titration study on 09/17/19.  Please call and inform patient that I have entered an order for treatment with positive airway pressure (PAP) treatment for his central and obstructive sleep apnea (OSA). He did well during the latest sleep study with BiPAP ST, which he has been on for years, he may be eligible for a new machine. We will, therefore, arrange for a machine for home use through a DME (durable medical equipment) company of His choice; and I will see the patient back in follow-up in about 10 weeks. Please also explain to the patient that I will be looking out for compliance data, which can be downloaded from the machine (stored on an SD card, that is inserted in the machine) or via remote access through a modem, that is built into the machine. At the time of the followup appointment we will discuss sleep study results and how it is going with PAP treatment at home. Please advise patient to bring His machine at the time of the first FU visit, even though this is cumbersome. Bringing the machine for every visit after that will likely not be needed, but often helps for the first visit to troubleshoot if needed. Please re-enforce the importance of compliance with treatment and the need for [place] to monitor compliance data - often an insurance requirement and actually good feedback for the patient as far as how they are doing.  Also remind patient, that any interim PAP machine or mask issues should be first addressed with the DME company, as they can often help better with technical and mask fit issues. Please ask if patient has a preference regarding DME company.  Please also make sure, the patient has a follow-up appointment with me in about 10 weeks from the setup date, thanks. May see one of our nurse practitioners if needed for proper timing of the FU appointment.  Please fax or rout  report to the referring provider. Thanks,   Star Age, MD, PhD [facility])

## 2019-09-25 NOTE — Procedures (Signed)
PATIENT'S NAME:  Sampson, Self. DOB:      [birth year]      MR#:    [id number]     DATE OF RECORDING: 09/17/2019 REFERRING M.D.:  Leanna Battles, M.D Study Performed:   CPAP  Titration HISTORY: 74 year old man with a history of type 2 diabetes, osteoarthritis, hyperlipidemia, depression, allergic rhinitis, kidney stones, recurrent headaches, status post retinal detachment surgery, status post right, then left knee replacements, status post TURP, and obesity, who presents for a full night titration study. His baseline sleep study from 07/28/19 showed severe central and obstructive sleep apnea, with a primary central component, total AHI of 47.7/hour, O2 nadir of 79%. He used to be on BiPAP ST (20/16 cm, rate of 12). The patient endorsed the Epworth Sleepiness Scale at 9/24 points. The patient's weight 267 pounds with a height of 72 (inches), resulting in a BMI of 36.1 kg/m2. The patient's neck circumference measured 18.8 inches.  CURRENT MEDICATIONS: Aspirin, Lipitor, Vitamin D, Cleocin, Plavix, Lexapro, Mag-ox, Robaxin, Lopressor, Nitrostat, Omega-3, Ditropan-XL.   PROCEDURE:  This is a multichannel digital polysomnogram utilizing the SomnoStar 11.2 system.  Electrodes and sensors were applied and monitored per AASM Specifications.   EEG, EOG, Chin and Limb EMG, were sampled at 200 Hz.  ECG, Snore and Nasal Pressure, Thermal Airflow, Respiratory Effort, CPAP Flow and Pressure, Oximetry was sampled at 50 Hz. Digital video and audio were recorded.      The patient was fitted with a F[employer]P Vitera FFM, large. CPAP was initiated at 5 cmH20 with heated humidity per AASM split night standards and pressure was advanced to 12 cmH20 because of hypopneas, apneas and desaturations. He had ongoing central apneas, with a total AHI of 80.7/hour at that pressure. He was, therefore switched to standard BIPAP pressure of 13/9 cm and titrated to 16/12 cm, but his AHI remained high at 27.7/hour. A backup rate of 12 was added  and he was titrated to a final pressure of 19/15 cmH20 with a rate of 12/min, at which point his AHI was 3/hour, O2 nadir of 88% with supine NREM sleep achieved.   Lights Out was at 21:33 and Lights On at 05:10. Total recording time (TRT) was 458 minutes, with a total sleep time (TST) of 422.5 minutes. The patient's sleep latency was 6.5 minutes. REM latency was 127.5 minutes, which is mildly delayed. The sleep efficiency was 92.2 %.    SLEEP ARCHITECTURE: WASO (Wake after sleep onset) was 28.5 minutes. There were 6 minutes in Stage N1, 258.5 minutes Stage N2, 83.5 minutes Stage N3 and 74.5 minutes in Stage REM.  The percentage of Stage N1 was 1.4%, Stage N2 was 61.2%, which is increased, Stage N3 was 19.8% and Stage R (REM sleep) was 17.6%, which is mildly reduced. The arousals were noted as: 27 were spontaneous, 46 were associated with PLMs, 39 were associated with respiratory events.  RESPIRATORY ANALYSIS:  There was a total of 205 respiratory events: 35 obstructive apneas, 109 central apneas and 1 mixed apneas with a total of 145 apneas and an apnea index (AI) of 20.6 /hour. There were 60 hypopneas with a hypopnea index of 8.5/hour. The patient also had 0 respiratory event related arousals (RERAs).      The total APNEA/HYPOPNEA INDEX  (AHI) was 29.1 /hour and the total RESPIRATORY DISTURBANCE INDEX was 29.1 /hour  6 events occurred in REM sleep and 199 events in NREM. The REM AHI was 4.8 /hour versus a non-REM AHI of 34.3 /hour.  The patient spent 219.5 minutes of total sleep time in the supine position and 203 minutes in non-supine. The supine AHI was 26.6, versus a non-supine AHI of 31.9.  OXYGEN SATURATION & C02:  The baseline 02 saturation was 93%, with the lowest being 84%. Time spent below 89% saturation equaled 5 minutes.  PERIODIC LIMB MOVEMENTS:  The patient had a total of 427 Periodic Limb Movements. The Periodic Limb Movement (PLM) index was 60.6 and the PLM Arousal index was 6.5  /hour.  Audio and video analysis did not show any abnormal or unusual movements, behaviors, phonations or vocalizations. The patient took 1 bathroom break. The EKG was in keeping with normal sinus rhythm (NSR) with occasional PVCs noted.  Post-study, the patient indicated that sleep was better than usual.   IMPRESSION:   1. Primary central sleep apnea 2. Obstructive Sleep Apnea (OSA) 3. Periodic Limb Movement Disorder (PLMD) 4. Dysfunctions associated with sleep stages or arousal from sleep 5. Non-specific abnormal EKG   RECOMMENDATIONS:   1. This study demonstrates eventual significant improvement of the patients complex sleep apnea, with a primary central component. He did not respond to CPAP or standard BiPAP, but did well with BiPAP ST, which was his treatment modality previously as well. I will, therefore, start the patient on home BiPAP ST treatment at a pressure of 19/15 cm with a backup rate of 12/min via large FFM with heated humidity. The patient should be reminded to be fully compliant with PAP therapy to improve sleep related symptoms and decrease long term cardiovascular risks. The patient should be reminded, that it may take up to 3 months to get fully used to using PAP with all planned sleep. The earlier full compliance is achieved, the better long term compliance tends to be. Please note that untreated obstructive sleep apnea may carry additional perioperative morbidity. Patients with significant obstructive sleep apnea should receive perioperative PAP therapy and the surgeons and particularly the anesthesiologist should be informed of the diagnosis and the severity of the sleep disordered breathing. 2. This study shows sleep fragmentation and abnormal sleep stage percentages; these are nonspecific findings and per se do not signify an intrinsic sleep disorder or a cause for the patient's sleep-related symptoms. Causes include (but are not limited to) the first night effect of the  sleep study, circadian rhythm disturbances, medication effect or an underlying mood disorder or medical problem.  3. The patient should be cautioned not to drive, work at heights, or operate dangerous or heavy equipment when tired or sleepy. Review and reiteration of good sleep hygiene measures should be pursued with any patient. 4. Severe PLMs (periodic limb movements of sleep) were noted during this study with minimal arousals; clinical correlation is recommended. Medication effect from the antidepressant medication should be considered.  5. The study showed occasional PVCs on single lead EKG; clinical correlation is recommended and consultation with cardiology may be feasible.  6. The patient will be seen in follow-up in the sleep clinic at [facility] for discussion of the test results, symptom and treatment compliance review, further management strategies, etc. The referring provider will be notified of the test results.   I certify that I have reviewed the entire raw data recording prior to the issuance of this report in accordance with the Standards of Accreditation of the American Academy of Sleep Medicine (AASM)       Star Age, MD, PhD Diplomat, American Board of Neurology and Sleep Medicine (Neurology and Sleep Medicine)

## 2019-09-25 NOTE — Telephone Encounter (Signed)
I reached out to the pt and spoke with his wife Colletta [place]) ok per dpr. She verbalized understanding on instructions and will advise Keith [name] on them as well.  F.u for bipap appointment has been made of 12/18/2019 with AL, NP. Pt would prefer to use [employer] for DME.  I have submitted a community message updating on the new order.   Letter has been mailed to the patient with this information as well.

## 2019-10-01 DIAGNOSIS — Z85828 Personal history of other malignant neoplasm of skin: Secondary | ICD-10-CM | POA: Diagnosis not present

## 2019-10-01 DIAGNOSIS — D692 Other nonthrombocytopenic purpura: Secondary | ICD-10-CM | POA: Diagnosis not present

## 2019-10-01 NOTE — Progress Notes (Signed)
Spoke with pt can you schedule a follow up

## 2019-10-02 ENCOUNTER — Encounter: Payer: Self-pay | Admitting: Cardiology

## 2019-10-02 ENCOUNTER — Ambulatory Visit: Payer: Medicare Other

## 2019-10-02 ENCOUNTER — Other Ambulatory Visit: Payer: Self-pay

## 2019-10-02 ENCOUNTER — Ambulatory Visit (INDEPENDENT_AMBULATORY_CARE_PROVIDER_SITE_OTHER): Payer: Medicare Other | Admitting: Cardiology

## 2019-10-02 VITALS — BP 134/77 | HR 79 | Temp 98.7°F | Resp 16 | Ht 73.0 in | Wt 262.1 lb

## 2019-10-02 DIAGNOSIS — I10 Essential (primary) hypertension: Secondary | ICD-10-CM | POA: Diagnosis not present

## 2019-10-02 DIAGNOSIS — I493 Ventricular premature depolarization: Secondary | ICD-10-CM

## 2019-10-02 DIAGNOSIS — I429 Cardiomyopathy, unspecified: Secondary | ICD-10-CM

## 2019-10-02 DIAGNOSIS — E669 Obesity, unspecified: Secondary | ICD-10-CM

## 2019-10-02 DIAGNOSIS — G4731 Primary central sleep apnea: Secondary | ICD-10-CM | POA: Diagnosis not present

## 2019-10-02 NOTE — Patient Instructions (Signed)
Heart Failure Eating Plan Heart failure, also called congestive heart failure, occurs when your heart does not pump blood well enough to meet your body's needs for oxygen-rich blood. Heart failure is a long-term (chronic) condition. Living with heart failure can be challenging. However, following your health care provider's instructions about a healthy lifestyle and working with a diet and nutrition specialist (dietitian) to choose the right foods may help to improve your symptoms. What are tips for following this plan? Reading food labels  Check food labels for the amount of sodium per serving. Choose foods that have less than 140 mg (milligrams) of sodium in each serving.  Check food labels for the number of calories per serving. This is important if you need to limit your daily calorie intake to lose weight.  Check food labels for the serving size. If you eat more than one serving, you will be eating more sodium and calories than what is listed on the label.  Look for foods that are labeled as "sodium-free," "very low sodium," or "low sodium." ? Foods labeled as "reduced sodium" or "lightly salted" may still have more sodium than what is recommended for you. Cooking  Avoid adding salt when cooking. Ask your health care provider or dietitian before using salt substitutes.  Season food with salt-free seasonings, spices, or herbs. Check the label of seasoning mixes to make sure they do not contain salt.  Cook with heart-healthy oils, such as olive, canola, soybean, or sunflower oil.  Do not fry foods. Cook foods using low-fat methods, such as baking, boiling, grilling, and broiling.  Limit unhealthy fats when cooking by: ? Removing the skin from poultry, such as chicken. ? Removing all visible fats from meats. ? Skimming the fat off from stews, soups, and gravies before serving them. Meal planning   Limit your intake of: ? Processed, canned, or pre-packaged foods. ? Foods that are  high in trans fat, such as fried foods. ? Sweets, desserts, sugary drinks, and other foods with added sugar. ? Full-fat dairy products, such as whole milk.  Eat a balanced diet that includes: ? 4-5 servings of fruit each day and 4-5 servings of vegetables each day. At each meal, try to fill half of your plate with fruits and vegetables. ? Up to 6-8 servings of whole grains each day. ? Up to 2 servings of lean meat, poultry, or fish each day. One serving of meat is equal to 3 oz. This is about the same size as a deck of cards. ? 2 servings of low-fat dairy each day. ? Heart-healthy fats. Healthy fats called omega-3 fatty acids are found in foods such as flaxseed and cold-water fish like sardines, salmon, and mackerel.  Aim to eat 25-35 g (grams) of fiber a day. Foods that are high in fiber include apples, broccoli, carrots, beans, peas, and whole grains.  Do not add salt or condiments that contain salt (such as soy sauce) to foods before eating.  When eating at a restaurant, ask that your food be prepared with less salt or no salt, if possible.  Try to eat 2 or more vegetarian meals each week.  Eat more home-cooked food and eat less restaurant, buffet, and fast food. General information  Do not eat more than 2,300 mg of salt (sodium) a day. The amount of sodium that is recommended for you may be lower, depending on your condition.  Maintain a healthy body weight as directed. Ask your health care provider what a healthy weight is   for you. ? Check your weight every day. ? Work with your health care provider and dietitian to make a plan that is right for you to lose weight or maintain your current weight.  Limit how much fluid you drink. Ask your health care provider or dietitian how much fluid you can have each day.  Limit or avoid alcohol as told by your health care provider or dietitian. Recommended foods The items listed may not be a complete list. Talk with your dietitian about what  dietary choices are best for you. Fruits All fresh, frozen, and canned fruits. Dried fruits, such as raisins, prunes, and cranberries. Vegetables All fresh vegetables. Vegetables that are frozen without sauce or added salt. Low-sodium or sodium-free canned vegetables. Grains Bread with less than 80 mg of sodium per slice. Whole-wheat pasta, quinoa, and brown rice. Oats and oatmeal. Barley. Millet. Grits and cream of wheat. Whole-grain and whole-wheat cold cereal. Meats and other protein foods Lean cuts of meat. Skinless chicken and turkey. Fish with high omega-3 fatty acids, such as salmon, sardines, and other cold-water fishes. Eggs. Dried beans, peas, and edamame. Unsalted nuts and nut butters. Dairy Low-fat or nonfat (skim) milk and dried milk. Rice milk, soy milk, and almond milk. Low-fat or nonfat yogurt. Small amounts of reduced-sodium block cheese. Low-sodium cottage cheese. Fats and oils Olive, canola, soybean, flaxseed, or sunflower oil. Avocado. Sweets and desserts Apple sauce. Granola bars. Sugar-free pudding and gelatin. Frozen fruit bars. Seasoning and other foods Fresh and dried herbs. Lemon or lime juice. Vinegar. Low-sodium ketchup. Salt-free marinades, salad dressings, sauces, and seasonings. The items listed above may not be a complete list of foods and beverages you can eat. Contact a dietitian for more information. Foods to avoid The items listed may not be a complete list. Talk with your dietitian about what dietary choices are best for you. Fruits Fruits that are dried with sodium-containing preservatives. Vegetables Canned vegetables. Frozen vegetables with sauce or seasonings. Creamed vegetables. French fries. Onion rings. Pickled vegetables and sauerkraut. Grains Bread with more than 80 mg of sodium per slice. Hot or cold cereal with more than 140 mg sodium per serving. Salted pretzels and crackers. Pre-packaged breadcrumbs. Bagels, croissants, and biscuits. Meats  and other protein foods Ribs and chicken wings. Bacon, ham, pepperoni, bologna, salami, and packaged luncheon meats. Hot dogs, bratwurst, and sausage. Canned meat. Smoked meat and fish. Salted nuts and seeds. Dairy Whole milk, half-and-half, and cream. Buttermilk. Processed cheese, cheese spreads, and cheese curds. Regular cottage cheese. Feta cheese. Shredded cheese. String cheese. Fats and oils Butter, lard, shortening, ghee, and bacon fat. Canned and packaged gravies. Seasoning and other foods Onion salt, garlic salt, table salt, and sea salt. Marinades. Regular salad dressings. Relishes, pickles, and olives. Meat flavorings and tenderizers, and bouillon cubes. Horseradish, ketchup, and mustard. Worcestershire sauce. Teriyaki sauce, soy sauce (including reduced sodium). Hot sauce and Tabasco sauce. Steak sauce, fish sauce, oyster sauce, and cocktail sauce. Taco seasonings. Barbecue sauce. Tartar sauce. The items listed above may not be a complete list of foods and beverages you should avoid. Contact a dietitian for more information. Summary  A heart failure eating plan includes changes that limit your intake of sodium and unhealthy fat, and it may help you lose weight or maintain a healthy weight. Your health care provider may also recommend limiting how much fluid you drink.  Most people with heart failure should eat no more than 2,300 mg of salt (sodium) a day. The amount of sodium   that is recommended for you may be lower, depending on your condition.  Contact your health care provider or dietitian before making any major changes to your diet. This information is not intended to replace advice given to you by your health care provider. Make sure you discuss any questions you have with your health care provider. Document Revised: 10/25/2018 Document Reviewed: 01/13/2017 Elsevier Patient Education  2020 Elsevier Inc.  

## 2019-10-02 NOTE — Progress Notes (Signed)
Primary Physician:  Leanna Battles, MD   Patient ID: Keith Drain., male    DOB: 1945-12-22, 74 y.o.   MRN: [id number]  Subjective:    Chief Complaint  Patient presents with  . Results    Echocardiogram    HPI: Keith [name].  is a 74 y.o. male  with HTN, HLD, DM type 2, central sleep apnea, and stage 3 CKD, last seen by [place] in 2019 for pre-op clearance due to frequent PVC's  He underwent coronary angiogram in 2015 revealing normal coronary arteries.   He recently underwent sleep study that showed frequent PVC's, and was reevaluated by [place]. He has had history of frequent PVC's in the past. Underwent echocardiogram and now presents for follow up. He denies any palpitations or heart racing. He has previously had symptoms of angina; however, states that he has not had this in sometime.   He had previously lost significant amount of weight and had stopped using Bipap; however, has now gained the weight back and is working to get back on his Bipap.    Past Medical History:  Diagnosis Date  . Anginal pain ([place])   . Anxiety    takes Celexa  . Arthritis    bil knees  . Benign prostatic hypertrophy    takes Flomax daily  . Bladder stones 06/25/2013   1.9cm bladder stone.  Marland Kitchen BPH with urinary obstruction 06/25/2013   127cc prostate with large middle lobe and bladder stones.   . Complication of anesthesia    hard to wake up  . Depression   . Diabetes mellitus without complication ([place])   . ED (erectile dysfunction)   . Hyperlipidemia   . Hypertension   . PONV (postoperative nausea and vomiting)   . Sleep apnea    uses bipap machine  . Spinal headache    reports last spinal headache was yesterday 05-16-18, lasted for 2 hours a, relief with tylenol , denies vision change with yesterdays occurrence but wife endorses vision changes with past spinal headaches    . Type 2 DM with CKD stage 3 and hypertension ([place])     Past Surgical History:  Procedure Laterality Date  .  CARDIAC CATHETERIZATION  06-15-14  . CATARACT EXTRACTION W/ INTRAOCULAR LENS  IMPLANT, BILATERAL  03/13/2003  . COLONOSCOPY    . CYSTOSCOPY WITH LITHOLAPAXY N/A 06/25/2013   Procedure: CYSTOSCOPY WITH LITHOLAPAXY;  Surgeon: [name], MD;  Location: [facility];  Service: Urology;  Laterality: N/A;  . KNEE ARTHROSCOPY  09/07/2010   left knee  . KNEE ARTHROSCOPY  05/27/2000   left knee  . LEFT HEART CATHETERIZATION WITH CORONARY ANGIOGRAM N/A 06/17/2014   Procedure: LEFT HEART CATHETERIZATION WITH CORONARY ANGIOGRAM;  Surgeon: Laverda Page, MD;  Location: [facility] C[facility];  Service: Cardiovascular;  Laterality: N/A;  . RETINAL DETACHMENT REPAIR W/ SCLERAL BUCKLE LE  10/14/2002   Dr Zadie Rhine  . TONSILLECTOMY     age 75  . TOTAL KNEE ARTHROPLASTY  08/01/2011   Procedure: TOTAL KNEE ARTHROPLASTY;  Surgeon: Lorn Junes, MD;  Location: [facility];  Service: Orthopedics;  Laterality: Right;  TOTAL KNEE ARTHROPLASTY  RIGHT SIDE  . TOTAL KNEE ARTHROPLASTY Left 05/22/2018   Procedure: LEFT TOTAL KNEE ARTHROPLASTY;  Surgeon: Paralee Cancel, MD;  Location: [facility];  Service: Orthopedics;  Laterality: Left;  . TRANSURETHRAL RESECTION OF PROSTATE N/A 06/25/2013   Procedure: TRANSURETHRAL RESECTION OF THE PROSTATE WITH GYRUS INSTRUMENTS;  Surgeon: [name], MD;  Location: [facility];  Service: Urology;  Laterality: N/A;  . VASECTOMY  1982      Tobacco Use: Low Risk   . Smoking Tobacco Use: Never Smoker  . Smokeless Tobacco Use: Never Used     Review of Systems  Constitution: Negative for decreased appetite, malaise/fatigue, weight gain and weight loss.  Eyes: Negative for visual disturbance.  Cardiovascular: Positive for chest pain (occasional with exertion; stable since 2015). Negative for claudication, dyspnea on exertion, leg swelling, orthopnea, palpitations and syncope.  Respiratory: Negative for hemoptysis and wheezing.   Endocrine: Negative for cold intolerance and heat intolerance.    Hematologic/Lymphatic: Does not bruise/bleed easily.  Skin: Negative for nail changes.  Musculoskeletal: Negative for muscle weakness and myalgias.  Gastrointestinal: Negative for abdominal pain, change in bowel habit, nausea and vomiting.  Neurological: Negative for difficulty with concentration, dizziness, focal weakness and headaches.  Psychiatric/Behavioral: Negative for altered mental status and suicidal ideas.  All other systems reviewed and are negative.     Objective:  Blood pressure 134/77, pulse 79, temperature 98.7 F (37.1 C), temperature source Temporal, resp. rate 16, height '6\' 1"'  (1.854 m), weight 262 lb 1.6 oz (118.9 kg), SpO2 96 %. Body mass index is 34.58 kg/m.    Physical Exam  Constitutional: He is oriented to person, place, and time. Vital signs are normal. He appears well-developed and well-nourished.  Well built and morbidly obese  HENT:  Head: Normocephalic and atraumatic.  Cardiovascular: Normal rate, regular rhythm, normal heart sounds and intact distal pulses.  Pulses:      Femoral pulses are 1+ on the right side and 1+ on the left side.      Popliteal pulses are 1+ on the right side and 1+ on the left side.       Dorsalis pedis pulses are 2+ on the right side and 2+ on the left side.       Posterior tibial pulses are 2+ on the right side and 2+ on the left side.  No edema  Pulmonary/Chest: Effort normal and breath sounds normal. No accessory muscle usage. No respiratory distress.  Abdominal: Soft. Bowel sounds are normal.  Musculoskeletal:        General: Normal range of motion.     Cervical back: Normal range of motion.  Neurological: He is alert and oriented to person, place, and time.  Skin: Skin is warm and dry.  Vitals reviewed.  Radiology: No results found.  Laboratory examination:    CMP Latest Ref Rng & Units 05/23/2018 05/17/2018 04/17/2018  Glucose 70 - 99 mg/dL 116(H) 112(H) -  BUN 8 - 23 mg/dL 37(H) 29(H) -  Creatinine 0.61 - 1.24 mg/dL  1.97(H) 2.23(H) -  Sodium 135 - 145 mmol/L 139 137 -  Potassium 3.5 - 5.1 mmol/L 4.2 4.7 -  Chloride 98 - 111 mmol/L 108 103 -  CO2 22 - 32 mmol/L 22 25 -  Calcium 8.9 - 10.3 mg/dL 8.4(L) 9.0 -  Total Protein 6.1 - 8.1 g/dL - - 6.8  Total Bilirubin 0.2 - 1.2 mg/dL - - 0.7  Alkaline Phos 39 - 117 U/L - - -  AST 10 - 35 U/L - - 12  ALT 9 - 46 U/L - - 10   CBC Latest Ref Rng & Units 05/23/2018 05/17/2018 08/05/2015  WBC 4.0 - 10.5 K/uL 15.2(H) 8.1 9.3  Hemoglobin 13.0 - 17.0 g/dL 10.8(L) 14.1 12.5(L)  Hematocrit 39.0 - 52.0 % 32.5(L) 42.5 38.1(L)  Platelets 150 - 400 K/uL 218 287  239   Lipid Panel     Component Value Date/Time   CHOL 119 11/12/2009 0856   TRIG 75.0 11/12/2009 0856   TRIG 118 06/29/2006 1325   HDL 47.00 11/12/2009 0856   CHOLHDL 3 11/12/2009 0856   VLDL 15.0 11/12/2009 0856   LDLCALC 57 11/12/2009 0856   LDLDIRECT 141.0 01/30/2007 1455   HEMOGLOBIN A1C Lab Results  Component Value Date   HGBA1C 6.0 (H) 05/17/2018   MPG 125.5 05/17/2018   TSH No results for input(s): TSH in the last 8760 hours.  PRN Meds:. Medications Discontinued During This Encounter  Medication Reason  . clindamycin (CLEOCIN) 300 MG capsule Error   Current Meds  Medication Sig  . aspirin EC 81 MG tablet Take 81 mg by mouth daily.  Marland Kitchen atorvastatin (LIPITOR) 20 MG tablet Take 20 mg by mouth daily.  . Cholecalciferol (VITAMIN D) 2000 UNITS tablet Take 2,000 Units by mouth daily.  Marland Kitchen escitalopram (LEXAPRO) 20 MG tablet Take 20 mg by mouth daily.   . finasteride (PROSCAR) 5 MG tablet Take 5 mg by mouth daily.  Marland Kitchen gabapentin (NEURONTIN) 100 MG capsule Take 1 capsule by mouth at bedtime.  . Investigational - Study Medication Inject 5 mg into the skin every Tuesday. Study name:Tirzepatide 61m subcutaneously once a week on Tuesdays Additional study dBerlinDrug Study  . magnesium oxide (MAG-OX) 400 MG tablet Take 400 mg by mouth daily.  . methocarbamol  (ROBAXIN) 500 MG tablet Take 1 tablet (500 mg total) by mouth every 6 (six) hours as needed for muscle spasms.  .Marland KitchenNITROSTAT 0.4 MG SL tablet Place 0.4 mg under the tongue every 5 (five) minutes as needed for chest pain.   . Omega-3 Fatty Acids (FISH OIL) 1000 MG CAPS Take 1,000 mg by mouth daily.    Cardiac Studies:   Echocardiogram 09/25/2019:  Left ventricle cavity is normal in size. Moderate concentric hypertrophy of the left ventricle. Hypokinetic global wall motion. Doppler evidence of grade I (impaired) diastolic dysfunction, normal LAP. Mildly depressed LV systolic function with visual EF 40-45%.  Left atrial cavity is moderately dilated at 4.9 cm. Trileaflet aortic valve.  Mild (Grade I) aortic regurgitation. Compared to 09/20/2017, previously LVEF was normal around 55%. AV regurgitation new.   Coronary Angiogram  [06/17/2014]: Mild noncritical coronary artery disease involving 10-20% stenosis, mid rca 20%, normal LV systolic function.  Nuclear stress test  [04/21/2014]: 1. Resting EKG demonstrates normal sinus rhythm, incomplete right bundle branch block, poor R-wave progression. Stress ECG negative for myocardial ischemia. Patient exercised for 5 minutes and 32 seconds and achieved 7.3 METS. Stress test terminated due to target heart rate met, patient developed mild dyspnea. 2. Perfusion imaging study demonstrates a medium size inferior, inferoapical and apical anterior ischemia extending from the mid ventricle. Dynamic QGS imaging reveal mild inferior wall hypokinesis. Left ventricular systolic function calculated by QGS was markedly depressed at 36%. Although the ischemic burden was small, this represents a high risk study due to depressed ejection fraction. Multivessel coronary artery disease cannot be completely excluded. Clinical correlation is recommended.  ABI's [05/14/2014]: Normal bilateral ABI at 1.11 with normal triphasic waveform at the level of the ankle.  Assessment:    Frequent PVCs - Plan: HOLTER MONITOR - 24 HOUR, PCV MYOCARDIAL PERFUSION WITH LEXISCAN  Cardiomyopathy, unspecified type (HAstatula - Plan: PCV MYOCARDIAL PERFUSION WITH LEXISCAN  Primary hypertension  Central sleep apnea  Obesity (BMI 30-39.9)  EKG 09/19/2019: Normal sinus rhythm at 66 bpm  with first degree AV block, left axis deviation, left anterior fasicular block, poor R-wave progression cannot exclude anterior infarct old. IVCD. No evidence of ischemia. No change compared to EKG 05/2018  Recommendations:   I had asked patient to come back to see [place] as his recent echocardiogram showed depressed LVEF compared to his previous echo in 2019.  I suspect this may be related to his frequent PVCs, but will also need to consider myocardial ischemia given his history of symptoms of angina in the past.  He has not had recent episodes of angina.  He had mild noncritical CAD on coronary angiogram in 2015.  I will schedule him for Lexiscan nuclear stress testing for further evaluation.  I will also place him on 24-hour Holter monitor to evaluate his PVC burden.  I suspect his PVCs are likely contributed by untreated sleep apnea, which she is working with neurology for management.  Depending upon his PVC burden, will start him on beta-blocker therapy.  No clinical evidence of heart failure.  Echocardiogram findings and plan of care was extensively discussed with the patient's wife who was also present at bedside.  All questions were answered.  I have encouraged him to start some light exercises and also make needed dietary changes to help with weight loss and to reduce his risk. I will see him back after the test for further recommendations and reevaluation.  Miquel Dunn, MSN, APRN, FNP-C [facility]. [place] Office: [phone] Fax: [phone]

## 2019-10-05 ENCOUNTER — Encounter: Payer: Self-pay | Admitting: Cardiology

## 2019-10-07 DIAGNOSIS — I10 Essential (primary) hypertension: Secondary | ICD-10-CM | POA: Diagnosis not present

## 2019-10-07 DIAGNOSIS — I4891 Unspecified atrial fibrillation: Secondary | ICD-10-CM | POA: Diagnosis not present

## 2019-10-09 ENCOUNTER — Ambulatory Visit (INDEPENDENT_AMBULATORY_CARE_PROVIDER_SITE_OTHER): Payer: Medicare Other

## 2019-10-09 ENCOUNTER — Other Ambulatory Visit: Payer: Self-pay

## 2019-10-09 DIAGNOSIS — I429 Cardiomyopathy, unspecified: Secondary | ICD-10-CM | POA: Diagnosis not present

## 2019-10-09 DIAGNOSIS — E119 Type 2 diabetes mellitus without complications: Secondary | ICD-10-CM | POA: Diagnosis not present

## 2019-10-09 DIAGNOSIS — I493 Ventricular premature depolarization: Secondary | ICD-10-CM

## 2019-10-10 ENCOUNTER — Telehealth: Payer: Self-pay

## 2019-10-10 NOTE — Telephone Encounter (Signed)
Discussed results with patient. He verbalized understanding.

## 2019-10-10 NOTE — Telephone Encounter (Signed)
-----   Message from Miquel Dunn, NP sent at 10/10/2019 12:56 PM EST ----- No evidence of ischemia or lack of blood supply. Will discuss at upcoming appointment.

## 2019-10-11 ENCOUNTER — Ambulatory Visit: Payer: Medicare Other

## 2019-10-11 DIAGNOSIS — E1129 Type 2 diabetes mellitus with other diabetic kidney complication: Secondary | ICD-10-CM | POA: Diagnosis not present

## 2019-10-11 DIAGNOSIS — N1831 Chronic kidney disease, stage 3a: Secondary | ICD-10-CM | POA: Diagnosis not present

## 2019-10-11 DIAGNOSIS — I739 Peripheral vascular disease, unspecified: Secondary | ICD-10-CM | POA: Diagnosis not present

## 2019-10-11 DIAGNOSIS — G4733 Obstructive sleep apnea (adult) (pediatric): Secondary | ICD-10-CM | POA: Diagnosis not present

## 2019-10-11 DIAGNOSIS — G4731 Primary central sleep apnea: Secondary | ICD-10-CM | POA: Diagnosis not present

## 2019-10-11 DIAGNOSIS — Z1331 Encounter for screening for depression: Secondary | ICD-10-CM | POA: Diagnosis not present

## 2019-10-11 DIAGNOSIS — F39 Unspecified mood [affective] disorder: Secondary | ICD-10-CM | POA: Diagnosis not present

## 2019-10-16 ENCOUNTER — Ambulatory Visit: Payer: Medicare Other

## 2019-10-17 ENCOUNTER — Ambulatory Visit: Payer: Medicare Other | Attending: Internal Medicine

## 2019-10-17 DIAGNOSIS — Z23 Encounter for immunization: Secondary | ICD-10-CM

## 2019-10-17 NOTE — Progress Notes (Signed)
   [id number]  Name:  Keith [name].    MRN: [id number] DOB: Sep 15, 1945  10/17/2019  Mr. Keith [name] was observed post Covid-19 immunization for 15 minutes without incidence. He was provided with Vaccine Information Sheet and instruction to access the V-Safe system.   Mr. Keith [name] was instructed to call 911 with any severe reactions post vaccine: Marland Kitchen Difficulty breathing  . Swelling of your face and throat  . A fast heartbeat  . A bad rash all over your body  . Dizziness and weakness    Immunizations Administered    Name Date Dose VIS Date Route   Pfizer COVID-19 Vaccine 10/17/2019 12:46 PM 0.3 mL 08/23/2019 Intramuscular   Manufacturer: [place]   Lot: [id number]   [place]: [id number]

## 2019-10-18 ENCOUNTER — Encounter: Payer: Self-pay | Admitting: Podiatry

## 2019-10-18 ENCOUNTER — Other Ambulatory Visit: Payer: Self-pay

## 2019-10-18 ENCOUNTER — Ambulatory Visit (INDEPENDENT_AMBULATORY_CARE_PROVIDER_SITE_OTHER): Payer: Medicare Other | Admitting: Podiatry

## 2019-10-18 DIAGNOSIS — R600 Localized edema: Secondary | ICD-10-CM

## 2019-10-18 DIAGNOSIS — B351 Tinea unguium: Secondary | ICD-10-CM

## 2019-10-18 DIAGNOSIS — M79675 Pain in left toe(s): Secondary | ICD-10-CM | POA: Diagnosis not present

## 2019-10-18 DIAGNOSIS — M79674 Pain in right toe(s): Secondary | ICD-10-CM

## 2019-10-18 NOTE — Progress Notes (Signed)
Subjective: Keith [name]. presents today for follow up of preventative diabetic foot care and painful mycotic nails b/l that are difficult to trim. Pain interferes with ambulation. Aggravating factors include wearing enclosed shoe gear. Pain is relieved with periodic professional debridement.  He states he did get compression hose for his edema. He finds them difficult to donn, so his wife puts them on for him.  Allergies: Penicillins and 5-alpha reductase inhibitors   Objective: There were no vitals filed for this visit.  Vascular Examination:  Capillary refill time to digits immediate b/l, palpable DP pulses b/l, palpable PT pulses b/l, pedal hair present b/l, skin temperature gradient within normal limits b/l, trace edema noted b/l feet and evidence of chronic venous insufficiency b/l LE  Dermatological Examination: Pedal skin with normal turgor, texture and tone bilaterally, no open wounds bilaterally, no interdigital macerations bilaterally and toenails 1-5 b/l elongated, dystrophic, thickened, crumbly with subungual debris  Musculoskeletal: Normal muscle strength 5/5 to all lower extremity muscle groups bilaterally and no pain crepitus or joint limitation noted with ROM b/l  Neurological: Protective sensation intact 5/5 intact bilaterally with 10g monofilament b/l and vibratory sensation intact b/l  Assessment: 1. Pain due to onychomycosis of toenails of both feet   2. Localized edema     Plan: -Continue diabetic foot care principles. Literature dispensed on today.  -Toenails 1-5 b/l were debrided in length and girth without iatrogenic bleeding. -Advised patient to purchase stocking butler aid to assist him in donning his compression stockings. He may purchase on [employer] or at [employer] supply store. He related understanding. -Patient to continue soft, supportive shoe gear daily. -Patient to report any pedal injuries to medical professional immediately. -Patient/POA to  call should there be question/concern in the interim.  Return in about 3 months (around 01/15/2020) for nail trim.

## 2019-10-18 NOTE — Patient Instructions (Addendum)
PLEASE PURCHASE COMPRESSION STOCKING BUTLER AID TO ASSIST YOU IN DONNING YOUR COMPRESSION HOSE.   Onychomycosis/Fungal Toenails  WHAT IS IT? An infection that lies within the keratin of your nail plate that is caused by a fungus.  WHY ME? Fungal infections affect all ages, sexes, races, and creeds.  There may be many factors that predispose you to a fungal infection such as age, coexisting medical conditions such as diabetes, or an autoimmune disease; stress, medications, fatigue, genetics, etc.  Bottom line: fungus thrives in a warm, moist environment and your shoes offer such a location.  IS IT CONTAGIOUS? Theoretically, yes.  You do not want to share shoes, nail clippers or files with someone who has fungal toenails.  Walking around barefoot in the same room or sleeping in the same bed is unlikely to transfer the organism.  It is important to realize, however, that fungus can spread easily from one nail to the next on the same foot.  HOW DO WE TREAT THIS?  There are several ways to treat this condition.  Treatment may depend on many factors such as age, medications, pregnancy, liver and kidney conditions, etc.  It is best to ask your doctor which options are available to you.  1. No treatment.   Unlike many other medical concerns, you can live with this condition.  However for many people this can be a painful condition and may lead to ingrown toenails or a bacterial infection.  It is recommended that you keep the nails cut short to help reduce the amount of fungal nail. 2. Topical treatment.  These range from herbal remedies to prescription strength nail lacquers.  About 40-50% effective, topicals require twice daily application for approximately 9 to 12 months or until an entirely new nail has grown out.  The most effective topicals are medical grade medications available through physicians offices. 3. Oral antifungal medications.  With an 80-90% cure rate, the most common oral medication  requires 3 to 4 months of therapy and stays in your system for a year as the new nail grows out.  Oral antifungal medications do require blood work to make sure it is a safe drug for you.  A liver function panel will be performed prior to starting the medication and after the first month of treatment.  It is important to have the blood work performed to avoid any harmful side effects.  In general, this medication safe but blood work is required. 4. Laser Therapy.  This treatment is performed by applying a specialized laser to the affected nail plate.  This therapy is noninvasive, fast, and non-painful.  It is not covered by insurance and is therefore, out of pocket.  The results have been very good with a 80-95% cure rate.  [facility] is the only practice in the area to offer this therapy. 5. Permanent Nail Avulsion.  Removing the entire nail so that a new nail will not grow back.

## 2019-10-30 ENCOUNTER — Ambulatory Visit: Payer: Medicare Other | Admitting: Cardiology

## 2019-10-30 ENCOUNTER — Encounter: Payer: Self-pay | Admitting: Cardiology

## 2019-10-30 ENCOUNTER — Other Ambulatory Visit: Payer: Self-pay

## 2019-10-30 VITALS — BP 149/71 | HR 57 | Temp 97.7°F | Ht 73.0 in | Wt 266.0 lb

## 2019-10-30 DIAGNOSIS — E669 Obesity, unspecified: Secondary | ICD-10-CM

## 2019-10-30 DIAGNOSIS — I493 Ventricular premature depolarization: Secondary | ICD-10-CM | POA: Diagnosis not present

## 2019-10-30 DIAGNOSIS — I428 Other cardiomyopathies: Secondary | ICD-10-CM | POA: Diagnosis not present

## 2019-10-30 DIAGNOSIS — G4731 Primary central sleep apnea: Secondary | ICD-10-CM | POA: Diagnosis not present

## 2019-10-30 DIAGNOSIS — I10 Essential (primary) hypertension: Secondary | ICD-10-CM

## 2019-10-30 MED ORDER — NITROGLYCERIN 0.4 MG SL SUBL: 0.4000 mg | SUBLINGUAL_TABLET | SUBLINGUAL | 3 refills | Status: DC | PRN

## 2019-10-30 MED ORDER — VALSARTAN 160 MG PO TABS: 160.0000 mg | ORAL_TABLET | Freq: Every day | ORAL | 1 refills | Status: DC

## 2019-10-30 MED ORDER — METOPROLOL SUCCINATE ER 25 MG PO TB24: 25.0000 mg | ORAL_TABLET | Freq: Every day | ORAL | 2 refills | Status: DC

## 2019-10-30 NOTE — Progress Notes (Signed)
Primary Physician:  Leanna Battles, MD   Patient ID: Keith Drain., male    DOB: 11/02/1945, 74 y.o.   MRN: [id number]  Subjective:    Chief Complaint  Patient presents with  . Cardiomyopathy    test results    HPI: Keith [name].  is a 74 y.o. male  with HTN, HLD, DM type 2, central sleep apnea, and stage 3 CKD, recently reevaluated by [place] for frequent PVCs seen during sleep study.  He underwent echocardiogram which showed newly noted depressed LVEF at 40 to 45%. He has had normal coronary arteries by cath in 2015. He underwent Lexiscan nuclear stress testing on 24-hour Holter monitor now presents for follow-up.  Patient is without complaints today.  Wife is present at bedside.  Denies any symptoms of chest pain or dyspnea on exertion.  He has been making dietary changes, but unfortunately has not been able to lose weight.  He previously had lost approximately 30 pounds but over the last year has gained the weight back.  He is now obtained BiPAP machine.  States that several years ago he was told to have hypertension, but after his weight loss, blood pressure was dropping too low with medications and hence these were discontinued.  He states that his diabetes is well controlled, currently enrolled in study.    Past Medical History:  Diagnosis Date  . Anginal pain ([place])   . Anxiety    takes Celexa  . Arthritis    bil knees  . Benign prostatic hypertrophy    takes Flomax daily  . Bladder stones 06/25/2013   1.9cm bladder stone.  Marland Kitchen BPH with urinary obstruction 06/25/2013   127cc prostate with large middle lobe and bladder stones.   . Complication of anesthesia    hard to wake up  . Depression   . Diabetes mellitus without complication ([place])   . ED (erectile dysfunction)   . Hyperlipidemia   . Hypertension   . PONV (postoperative nausea and vomiting)   . Sleep apnea    uses bipap machine  . Spinal headache    reports last spinal headache was yesterday 05-16-18,  lasted for 2 hours a, relief with tylenol , denies vision change with yesterdays occurrence but wife endorses vision changes with past spinal headaches    . Type 2 DM with CKD stage 3 and hypertension ([place])     Past Surgical History:  Procedure Laterality Date  . CARDIAC CATHETERIZATION  06-15-14  . CATARACT EXTRACTION W/ INTRAOCULAR LENS  IMPLANT, BILATERAL  03/13/2003  . COLONOSCOPY    . CYSTOSCOPY WITH LITHOLAPAXY N/A 06/25/2013   Procedure: CYSTOSCOPY WITH LITHOLAPAXY;  Surgeon: [name], MD;  Location: [facility];  Service: Urology;  Laterality: N/A;  . KNEE ARTHROSCOPY  09/07/2010   left knee  . KNEE ARTHROSCOPY  05/27/2000   left knee  . LEFT HEART CATHETERIZATION WITH CORONARY ANGIOGRAM N/A 06/17/2014   Procedure: LEFT HEART CATHETERIZATION WITH CORONARY ANGIOGRAM;  Surgeon: Laverda Page, MD;  Location: [facility] CATH [facility];  Service: Cardiovascular;  Laterality: N/A;  . RETINAL DETACHMENT REPAIR W/ SCLERAL BUCKLE LE  10/14/2002   Dr Zadie Rhine  . TONSILLECTOMY     age 26  . TOTAL KNEE ARTHROPLASTY  08/01/2011   Procedure: TOTAL KNEE ARTHROPLASTY;  Surgeon: Lorn Junes, MD;  Location: [facility];  Service: Orthopedics;  Laterality: Right;  TOTAL KNEE ARTHROPLASTY  RIGHT SIDE  . TOTAL KNEE ARTHROPLASTY Left 05/22/2018   Procedure: LEFT TOTAL  KNEE ARTHROPLASTY;  Surgeon: Paralee Cancel, MD;  Location: [facility];  Service: Orthopedics;  Laterality: Left;  . TRANSURETHRAL RESECTION OF PROSTATE N/A 06/25/2013   Procedure: TRANSURETHRAL RESECTION OF THE PROSTATE WITH GYRUS INSTRUMENTS;  Surgeon: [name], MD;  Location: [facility];  Service: Urology;  Laterality: N/A;  . VASECTOMY  1982      Tobacco Use: Low Risk   . Smoking Tobacco Use: Never Smoker  . Smokeless Tobacco Use: Never Used     Review of Systems  Constitution: Negative for decreased appetite, malaise/fatigue, weight gain and weight loss.  Eyes: Negative for visual disturbance.  Cardiovascular: Positive for chest pain (occasional  with exertion; stable since 2015). Negative for claudication, dyspnea on exertion, leg swelling, orthopnea, palpitations and syncope.  Respiratory: Negative for hemoptysis and wheezing.   Endocrine: Negative for cold intolerance and heat intolerance.  Hematologic/Lymphatic: Does not bruise/bleed easily.  Skin: Negative for nail changes.  Musculoskeletal: Negative for muscle weakness and myalgias.  Gastrointestinal: Negative for abdominal pain, change in bowel habit, nausea and vomiting.  Neurological: Negative for difficulty with concentration, dizziness, focal weakness and headaches.  Psychiatric/Behavioral: Negative for altered mental status and suicidal ideas.  All other systems reviewed and are negative.     Objective:  Blood pressure (!) 149/71, pulse (!) 57, temperature 97.7 F (36.5 C), height 6\' 1"  (1.854 m), weight 266 lb (120.7 kg), SpO2 96 %. Body mass index is 35.09 kg/m.    Physical Exam  Constitutional: He is oriented to person, place, and time. Vital signs are normal. He appears well-developed and well-nourished.  Well built and morbidly obese  HENT:  Head: Normocephalic and atraumatic.  Cardiovascular: Normal rate, regular rhythm, normal heart sounds and intact distal pulses.  Pulses:      Femoral pulses are 1+ on the right side and 1+ on the left side.      Popliteal pulses are 1+ on the right side and 1+ on the left side.       Dorsalis pedis pulses are 2+ on the right side and 2+ on the left side.       Posterior tibial pulses are 2+ on the right side and 2+ on the left side.  No edema  Pulmonary/Chest: Effort normal and breath sounds normal. No accessory muscle usage. No respiratory distress.  Abdominal: Soft. Bowel sounds are normal.  Musculoskeletal:        General: Normal range of motion.     Cervical back: Normal range of motion.  Neurological: He is alert and oriented to person, place, and time.  Skin: Skin is warm and dry.  Vitals  reviewed.  Radiology: No results found.  Laboratory examination:   External labs: Lipid Panel completed 06/14/2018 HDL 43 MG/DL 06/14/2018 LDL 62.000 mg 06/14/2018 Cholesterol, total 129.000 m 06/14/2018 Triglycerides 122.000 06/14/2018 A1C 6.600 % 10/09/2019 Glucose Random 275.000 M 06/20/2019 MicroAlbumin Urine 28.000 06/21/2018 MicroAlbumin/Creat 24.4 MG/ 06/21/2018 BUN 21.000 M 06/20/2019 Creatinine, Serum 1.950 MG/ 06/20/2019  CMP Latest Ref Rng & Units 05/23/2018 05/17/2018 04/17/2018  Glucose 70 - 99 mg/dL 116(H) 112(H) -  BUN 8 - 23 mg/dL 37(H) 29(H) -  Creatinine 0.61 - 1.24 mg/dL 1.97(H) 2.23(H) -  Sodium 135 - 145 mmol/L 139 137 -  Potassium 3.5 - 5.1 mmol/L 4.2 4.7 -  Chloride 98 - 111 mmol/L 108 103 -  CO2 22 - 32 mmol/L 22 25 -  Calcium 8.9 - 10.3 mg/dL 8.4(L) 9.0 -  Total Protein 6.1 - 8.1 g/dL - -  6.8  Total Bilirubin 0.2 - 1.2 mg/dL - - 0.7  Alkaline Phos 39 - 117 U/L - - -  AST 10 - 35 U/L - - 12  ALT 9 - 46 U/L - - 10   CBC Latest Ref Rng & Units 05/23/2018 05/17/2018 08/05/2015  WBC 4.0 - 10.5 K/uL 15.2(H) 8.1 9.3  Hemoglobin 13.0 - 17.0 g/dL 10.8(L) 14.1 12.5(L)  Hematocrit 39.0 - 52.0 % 32.5(L) 42.5 38.1(L)  Platelets 150 - 400 K/uL 218 287 239   Lipid Panel     Component Value Date/Time   CHOL 119 11/12/2009 0856   TRIG 75.0 11/12/2009 0856   TRIG 118 06/29/2006 1325   HDL 47.00 11/12/2009 0856   CHOLHDL 3 11/12/2009 0856   VLDL 15.0 11/12/2009 0856   LDLCALC 57 11/12/2009 0856   LDLDIRECT 141.0 01/30/2007 1455   HEMOGLOBIN A1C Lab Results  Component Value Date   HGBA1C 6.0 (H) 05/17/2018   MPG 125.5 05/17/2018   TSH No results for input(s): TSH in the last 8760 hours.  PRN Meds:. Medications Discontinued During This Encounter  Medication Reason  . finasteride (PROSCAR) 5 MG tablet Completed Course  . valsartan (DIOVAN) 160 MG tablet Discontinued by provider   Current Meds  Medication Sig  . aspirin EC 81 MG tablet Take 81 mg by mouth daily.   Marland Kitchen atorvastatin (LIPITOR) 20 MG tablet Take 20 mg by mouth daily.  . Cholecalciferol (VITAMIN D) 2000 UNITS tablet Take 2,000 Units by mouth daily.  Marland Kitchen escitalopram (LEXAPRO) 20 MG tablet Take 20 mg by mouth daily.   Marland Kitchen gabapentin (NEURONTIN) 100 MG capsule Take 1 capsule by mouth at bedtime.  . magnesium oxide (MAG-OX) 400 MG tablet Take 400 mg by mouth daily.  . methocarbamol (ROBAXIN) 500 MG tablet Take 1 tablet (500 mg total) by mouth every 6 (six) hours as needed for muscle spasms.  . Omega-3 Fatty Acids (FISH OIL) 1000 MG CAPS Take 1,000 mg by mouth daily.  Marland Kitchen oxybutynin (DITROPAN-XL) 10 MG 24 hr tablet daily.   . [DISCONTINUED] NITROSTAT 0.4 MG SL tablet Place 0.4 mg under the tongue every 5 (five) minutes as needed for chest pain.     Cardiac Studies:   Lexiscan (Walking with mod Bruce)Tetrofosmin Stress Test  10/09/2019: Nondiagnostic ECG stress. Resting EKG/ECG demonstrated normal sinus rhythm. Left ventricular strain patterns present.  Peak EKG/ECG revealed no significant ST-T change from baseline abnormality. There is a fixed moderate defect in the inferior and apical regions consistent with scar versus diaphragmatic attenuation. Global hypokinesis with stress LV EF: 35%. LV is mildly dilated in both rest and stress images.  No significant change from 04/21/2014.  Intermediate risk study due to low LVEF.   24-hour Holter monitor 10/02/2019: Normal sinus rhythm.  Rare PAC and PVC.  PVC occurred both in the form of isolated PVCs and in bigeminal pattern.  No atrial fibrillation was noted. No reported symptoms  Echocardiogram 09/25/2019:  Left ventricle cavity is normal in size. Moderate concentric hypertrophy of the left ventricle. Hypokinetic global wall motion. Doppler evidence of grade I (impaired) diastolic dysfunction, normal LAP. Mildly depressed LV systolic function with visual EF 40-45%.  Left atrial cavity is moderately dilated at 4.9 cm. Trileaflet aortic valve.  Mild (Grade  I) aortic regurgitation. Compared to 09/20/2017, previously LVEF was normal around 55%. AV regurgitation new.   Coronary Angiogram  [06/17/2014]: Mild noncritical coronary artery disease involving 10-20% stenosis, mid rca 20%, normal LV systolic function.  ABI's [05/14/2014]: Normal bilateral  ABI at 1.11 with normal triphasic waveform at the level of the ankle.  Assessment:   Nonischemic cardiomyopathy (HCC)  PVC (premature ventricular contraction)  Elevated blood-pressure reading without diagnosis of hypertension  Obesity (BMI 30-39.9)  Central sleep apnea  EKG 09/19/2019: Normal sinus rhythm at 66 bpm with first degree AV block, left axis deviation, left anterior fasicular block, poor R-wave progression cannot exclude anterior infarct old. IVCD. No evidence of ischemia. No change compared to EKG 05/2018  Recommendations:   I have reviewed and discussed recent 24-hour Holter monitor and stress test results with the patient and his wife, no evidence of ischemia was noted by stress testing.  He did not have a significant PVC burden by Holter monitor.  He has newly noted nonischemic cardiomyopathy by recent echocardiogram.  No evidence of decompensated heart failure.  He denies any symptoms of dyspnea or chest pain.  We will recommend starting medical therapy for nonischemic cardiomyopathy and repeating echocardiogram potentially in 6 months.    I have not started him on ACE inhibitor or ARB therapy in view of chronic kidney disease that is stage III.  Will request records from nephrology.  In view of his cardiomyopathy and occasional PVCs, will start him on metoprolol succinate 25 mg daily.  He was previously on metoprolol and tolerated this well.  Blood pressure is also elevated today, encouraged him to start regular monitoring. I suspect that he may have redeveloped hypertension with weight gain, which could be contributing to his low EF.   He has been making dietary changes to help with  weight loss and following low sodium diet.  He was previously lost 30 pounds, and had recently regained this discussed the importance of again losing weight. He is now using Bipap for central sleep apnea.  I will plan to see him back in 6 weeks for follow-up.  We will consider starting BiDil at his next appointment.  Miquel Dunn, MSN, APRN, FNP-C [facility]. [place] Office: [phone] Fax: [phone]

## 2019-11-06 NOTE — Progress Notes (Signed)
24-hour Holter monitor 10/02/2019: Normal sinus rhythm.  Rare PAC and PVC.  PVC occurred both in the form of isolated PVCs and in bigeminal pattern.  No atrial fibrillation was noted. No reported symptoms

## 2019-11-11 ENCOUNTER — Ambulatory Visit: Payer: Medicare Other | Attending: Internal Medicine

## 2019-11-11 DIAGNOSIS — Z23 Encounter for immunization: Secondary | ICD-10-CM | POA: Insufficient documentation

## 2019-11-11 NOTE — Progress Notes (Signed)
   [id number]  Name:  Keith [name].    MRN: [id number] DOB: 1946/07/07  11/11/2019  [name] was observed post Covid-19 immunization for 15 minutes without incidence. He was provided with Vaccine Information Sheet and instruction to access the V-Safe system.   [name] was instructed to call 911 with any severe reactions post vaccine: Marland Kitchen Difficulty breathing  . Swelling of your face and throat  . A fast heartbeat  . A bad rash all over your body  . Dizziness and weakness    Immunizations Administered    Name Date Dose VIS Date Route   Pfizer COVID-19 Vaccine 11/11/2019  3:41 PM 0.3 mL 08/23/2019 Intramuscular   Manufacturer: [place]   Lot: [id number]   [place]: [id number]

## 2019-12-11 DIAGNOSIS — I10 Essential (primary) hypertension: Secondary | ICD-10-CM | POA: Diagnosis not present

## 2019-12-11 DIAGNOSIS — E1129 Type 2 diabetes mellitus with other diabetic kidney complication: Secondary | ICD-10-CM | POA: Diagnosis not present

## 2019-12-11 DIAGNOSIS — N1832 Chronic kidney disease, stage 3b: Secondary | ICD-10-CM | POA: Diagnosis not present

## 2019-12-11 DIAGNOSIS — I2510 Atherosclerotic heart disease of native coronary artery without angina pectoris: Secondary | ICD-10-CM | POA: Diagnosis not present

## 2019-12-18 ENCOUNTER — Encounter: Payer: Self-pay | Admitting: Family Medicine

## 2019-12-18 ENCOUNTER — Ambulatory Visit (INDEPENDENT_AMBULATORY_CARE_PROVIDER_SITE_OTHER): Payer: Medicare Other | Admitting: Family Medicine

## 2019-12-18 ENCOUNTER — Other Ambulatory Visit: Payer: Self-pay

## 2019-12-18 VITALS — BP 122/86 | HR 74 | Temp 97.5°F | Ht 73.0 in | Wt 260.0 lb

## 2019-12-18 DIAGNOSIS — G4730 Sleep apnea, unspecified: Secondary | ICD-10-CM | POA: Insufficient documentation

## 2019-12-18 DIAGNOSIS — G4731 Primary central sleep apnea: Secondary | ICD-10-CM | POA: Diagnosis not present

## 2019-12-18 NOTE — Progress Notes (Addendum)
PATIENT: Keith [name]. DOB: 1945-10-15  REASON FOR VISIT: follow up HISTORY FROM: patient  Chief Complaint  Patient presents with  . Follow-up    bipap fu,pt states he is sleep well     HISTORY OF PRESENT ILLNESS: Today 12/18/19 Keith [name]. is a 74 y.o. male here today for follow up for primary central sleep apnea recently restarted on BiPAP therapy. Sleep study in 07/2019 revealed "severe central and obstructive sleep apnea, with a primary central component, total AHI of 47.7/hour, O2 nadir of 79%". Titration study confirmed adequate treatment with BiPAP therapy. He is doing well on BiPAP therapy.  He does feel that he is sleeping better.  He has noted a leak in his mask.  He is currently using a full facemask.  He does like the mask he is using and is willing to continue adjusting his headgear to ensure appropriate fit of mask. He does see cardiology regularly.  Lexiscan stress test on 10/09/2019 was stable from 2015.  Holter monitor on 10/02/2019 did reveal rare PACs and PVCs both in isolated and bigeminal pattern.  Atrial fibrillation was not noted.  Compliance report dated 11/18/2019 3 12/17/2019 reveals that he has used BiPAP therapy 28 of the last 30 days for compliance of 93%.  He used BiPAP greater than 4 hours 26 of the last 30 days for compliance of 87%.  Average usage was 7 hours and 9 minutes.  Residual AHI was 5.0 with IPAP of 19 cm of water and EPAP of 15 cm of water.   HISTORY: (copied from Dr Guadelupe Sabin note on 07/03/2019)  Dear Dr. Philip Aspen, I saw your patient, Keith [name], upon your kind request in my sleep clinic today for reevaluation of his obstructive sleep apnea, on treatment with BiPAP.  The patient is accompanied by his wife today.  I have previously followed him for his obstructive sleep apnea and recurrent headaches.  I last saw him over 3 years ago at which time he was compliant with his BiPAP ST and he was taking Topamax low-dose twice daily for headache  prevention.  As you know, [name] is a 74 year old right-handed gentleman with an underlying medical history of type 2 diabetes, osteoarthritis, hyperlipidemia, depression, allergic rhinitis, kidney stones, recurrent headaches, status post retinal detachment surgery, status post right, then left knee replacements, status post TURP, and obesity, who has been on BiPAP ST for his sleep apnea for years.  He had sleep study testing through our office on 04/29/2014 which was a split-night sleep study and a subsequent titration study in September 2015.  I reviewed his BiPAP compliance data.  Out of the last 30 days he used his machine only 12 days, residual AHI around 8.1/h.  Average usage for days on treatment of 4 hours and 10 minutes, leak on the high side with a 95th percentile at 55 L/min, pressure of 20/16 with a backup rate of 12.  In the past 90 days, he used his machine 32 days.  Residual AHI around 8.4.  He reports that he is trying to get back on the machine, he finds it uncomfortable.  He uses a full facemask.  In the past 9 days he has consistently tried to use his machine.  His wife reports that he does not snore when he sleeps with the BiPAP and seems to be less restless, less twitching noted by wife.  He feels that the settings are not optimal, he had stopped using his machine because  he had been able to lose weight but he gained some weight back.  When he started gaining weight again, his wife noticed recurrence of snoring.  He would be willing to get retested for his sleep apnea and consider consistently using a CPAP or BiPAP machine.  He would be eligible for a new machine since his set up date was 05/12/2014.  His Epworth sleepiness score is 9 out of 24, fatigue severity score is 62 out of 63.  His sleep is interrupted.  He has nocturia about once per average night.  He generally goes to bed around 10, rise time is between 8 and 9.  He has to get up at 4 to let the 2 dogs out, then he has to feed him at  5 AM.  He has had morning headaches at times.  He has limited his caffeine intake to 1 cup of chai tea per day, occasional soda or occasional ice tea.  He does not smoke cigarettes, he smokes a cigar a month approximately. He drinks alcohol rarely.    REVIEW OF SYSTEMS: Out of a complete 14 system review of symptoms, the patient complains only of the following symptoms, none and all other reviewed systems are negative.  ESS: 12 FSS: 28  ALLERGIES: Allergies  Allergen Reactions  . Penicillins Anaphylaxis    eyes swell shut  Has patient had a PCN reaction causing immediate rash, facial/tongue/throat swelling, SOB or lightheadedness with hypotension: yes Has patient had a PCN reaction causing severe rash involving mucus membranes or skin necrosis: no Has patient had a PCN reaction that required hospitalization no Has patient had a PCN reaction occurring within the last 10 years: no If all of the above answers are "NO", then may proceed with Cephalosporin use.  Judeth Cornfield Reductase Inhibitors     HOME MEDICATIONS: Outpatient Medications Prior to Visit  Medication Sig Dispense Refill  . aspirin EC 81 MG tablet Take 81 mg by mouth daily.    Marland Kitchen atorvastatin (LIPITOR) 20 MG tablet Take 20 mg by mouth daily.    . Cholecalciferol (VITAMIN D) 2000 UNITS tablet Take 2,000 Units by mouth daily.    Marland Kitchen escitalopram (LEXAPRO) 20 MG tablet Take 20 mg by mouth daily.     Marland Kitchen gabapentin (NEURONTIN) 100 MG capsule Take 1 capsule by mouth at bedtime.    . Investigational - Study Medication Inject 5 mg into the skin every Tuesday. Study name:Tirzepatide 5mg  subcutaneously once a week on Tuesdays Additional study [place] [facility] Study    . magnesium oxide (MAG-OX) 400 MG tablet Take 400 mg by mouth daily.    . methocarbamol (ROBAXIN) 500 MG tablet Take 1 tablet (500 mg total) by mouth every 6 (six) hours as needed for muscle spasms. 40 tablet 0  . metoprolol succinate  (TOPROL-XL) 25 MG 24 hr tablet Take 1 tablet (25 mg total) by mouth daily. Take with or immediately following a meal. 30 tablet 2  . nitroGLYCERIN (NITROSTAT) 0.4 MG SL tablet Place 1 tablet (0.4 mg total) under the tongue every 5 (five) minutes as needed for chest pain. 25 tablet 3  . Omega-3 Fatty Acids (FISH OIL) 1000 MG CAPS Take 1,000 mg by mouth daily.    Marland Kitchen oxybutynin (DITROPAN-XL) 10 MG 24 hr tablet daily.      No facility-administered medications prior to visit.    PAST MEDICAL HISTORY: Past Medical History:  Diagnosis Date  . Anginal pain ([place])   . Anxiety  takes Celexa  . Arthritis    bil knees  . Benign prostatic hypertrophy    takes Flomax daily  . Bladder stones 06/25/2013   1.9cm bladder stone.  Marland Kitchen BPH with urinary obstruction 06/25/2013   127cc prostate with large middle lobe and bladder stones.   . Complication of anesthesia    hard to wake up  . Depression   . Diabetes mellitus without complication ([place])   . ED (erectile dysfunction)   . Hyperlipidemia   . Hypertension   . PONV (postoperative nausea and vomiting)   . Sleep apnea    uses bipap machine  . Spinal headache    reports last spinal headache was yesterday 05-16-18, lasted for 2 hours a, relief with tylenol , denies vision change with yesterdays occurrence but wife endorses vision changes with past spinal headaches    . Type 2 DM with CKD stage 3 and hypertension ([place])     PAST SURGICAL HISTORY: Past Surgical History:  Procedure Laterality Date  . CARDIAC CATHETERIZATION  06-15-14  . CATARACT EXTRACTION W/ INTRAOCULAR LENS  IMPLANT, BILATERAL  03/13/2003  . COLONOSCOPY    . CYSTOSCOPY WITH LITHOLAPAXY N/A 06/25/2013   Procedure: CYSTOSCOPY WITH LITHOLAPAXY;  Surgeon: [name], MD;  Location: [facility];  Service: Urology;  Laterality: N/A;  . KNEE ARTHROSCOPY  09/07/2010   left knee  . KNEE ARTHROSCOPY  05/27/2000   left knee  . LEFT HEART CATHETERIZATION WITH CORONARY ANGIOGRAM N/A 06/17/2014    Procedure: LEFT HEART CATHETERIZATION WITH CORONARY ANGIOGRAM;  Surgeon: Laverda Page, MD;  Location: [facility] CATH LAB;  Service: Cardiovascular;  Laterality: N/A;  . RETINAL DETACHMENT REPAIR W/ SCLERAL BUCKLE LE  10/14/2002   Dr Zadie Rhine  . TONSILLECTOMY     age 5  . TOTAL KNEE ARTHROPLASTY  08/01/2011   Procedure: TOTAL KNEE ARTHROPLASTY;  Surgeon: Lorn Junes, MD;  Location: [place];  Service: Orthopedics;  Laterality: Right;  TOTAL KNEE ARTHROPLASTY  RIGHT SIDE  . TOTAL KNEE ARTHROPLASTY Left 05/22/2018   Procedure: LEFT TOTAL KNEE ARTHROPLASTY;  Surgeon: Paralee Cancel, MD;  Location: [facility];  Service: Orthopedics;  Laterality: Left;  . TRANSURETHRAL RESECTION OF PROSTATE N/A 06/25/2013   Procedure: TRANSURETHRAL RESECTION OF THE PROSTATE WITH GYRUS INSTRUMENTS;  Surgeon: [name], MD;  Location: [facility];  Service: Urology;  Laterality: N/A;  . VASECTOMY  1982    FAMILY HISTORY: Family History  Problem Relation Age of Onset  . Hypertension Mother   . Heart failure Mother   . Heart failure Father   . Arthritis Other   . Cancer Other        breast, skin  . Coronary artery disease Other   . Heart disease Other   . Anesthesia problems Neg Hx   . Hypotension Neg Hx   . Malignant hyperthermia Neg Hx   . Pseudochol deficiency Neg Hx     SOCIAL HISTORY: Social History   Socioeconomic History  . Marital status: Married    Spouse name: Not on file  . Number of children: 2  . Years of education: Not on file  . Highest education level: Not on file  Occupational History  . Not on file  Tobacco Use  . Smoking status: Never Smoker  . Smokeless tobacco: Never Used  Substance and Sexual Activity  . Alcohol use: Yes    Alcohol/week: 0.0 standard drinks    Comment: rare   . Drug use: No  . Sexual activity: Not on file  Other Topics Concern  . Not on file  Social History Narrative   Right handed.    Social Determinants of Health   Financial Resource Strain:   . Difficulty  of Paying Living Expenses:   Food Insecurity:   . Worried About Charity fundraiser in the Last Year:   . Arboriculturist in the Last Year:   Transportation Needs:   . Film/video editor (Medical):   Marland Kitchen Lack of Transportation (Non-Medical):   Physical Activity:   . Days of Exercise per Week:   . Minutes of Exercise per Session:   Stress:   . Feeling of Stress :   Social Connections:   . Frequency of Communication with Friends and Family:   . Frequency of Social Gatherings with Friends and Family:   . Attends Religious Services:   . Active Member of Clubs or Organizations:   . Attends Archivist Meetings:   Marland Kitchen Marital Status:   Intimate Partner Violence:   . Fear of Current or Ex-Partner:   . Emotionally Abused:   Marland Kitchen Physically Abused:   . Sexually Abused:       PHYSICAL EXAM  Vitals:   12/18/19 1459  BP: 122/86  Pulse: 74  Temp: (!) 97.5 F (36.4 C)  Weight: 260 lb (117.9 kg)  Height: 6\' 1"  (1.854 m)   Body mass index is 34.3 kg/m.  Generalized: Well developed, in no acute distress  Cardiology: normal rate and rhythm, no murmur noted Respiratory: Clear to auscultation bilaterally Neurological examination  Mentation: Alert oriented to time, place, history taking. Follows all commands speech and language fluent Cranial nerve II-XII: Pupils were equal round reactive to light. Extraocular movements were full, visual field were full  Motor: The motor testing reveals 5 over 5 strength of all 4 extremities. Good symmetric motor tone is noted throughout.   Gait and station: Gait is normal.    DIAGNOSTIC DATA (LABS, IMAGING, TESTING) - I reviewed patient records, labs, notes, testing and imaging myself where available.  No flowsheet data found.   Lab Results  Component Value Date   WBC 15.2 (H) 05/23/2018   HGB 10.8 (L) 05/23/2018   HCT 32.5 (L) 05/23/2018   MCV 92.1 05/23/2018   PLT 218 05/23/2018      Component Value Date/Time   NA 139 05/23/2018  0507   K 4.2 05/23/2018 0507   CL 108 05/23/2018 0507   CO2 22 05/23/2018 0507   GLUCOSE 116 (H) 05/23/2018 0507   GLUCOSE 139 (H) 06/29/2006 1325   BUN 37 (H) 05/23/2018 0507   CREATININE 1.97 (H) 05/23/2018 0507   CALCIUM 8.4 (L) 05/23/2018 0507   PROT 6.8 04/17/2018 1146   ALBUMIN 3.9 07/26/2011 0939   AST 12 04/17/2018 1146   ALT 10 04/17/2018 1146   ALKPHOS 63 07/26/2011 0939   BILITOT 0.7 04/17/2018 1146   GFRNONAA 32 (L) 05/23/2018 0507   GFRAA 38 (L) 05/23/2018 0507   Lab Results  Component Value Date   CHOL 119 11/12/2009   HDL 47.00 11/12/2009   LDLCALC 57 11/12/2009   LDLDIRECT 141.0 01/30/2007   TRIG 75.0 11/12/2009   CHOLHDL 3 11/12/2009   Lab Results  Component Value Date   HGBA1C 6.0 (H) 05/17/2018   No results found for: [id number] Lab Results  Component Value Date   TSH 1.90 08/19/2010       ASSESSMENT AND PLAN 74 y.o. year old male  has a past medical history of Anginal  pain ([place]), Anxiety, Arthritis, Benign prostatic hypertrophy, Bladder stones (06/25/2013), BPH with urinary obstruction (24/26/8341), Complication of anesthesia, Depression, Diabetes mellitus without complication ([place]), ED (erectile dysfunction), Hyperlipidemia, Hypertension, PONV (postoperative nausea and vomiting), Sleep apnea, Spinal headache, and Type 2 DM with CKD stage 3 and hypertension ([place]). here with     ICD-10-CM   1. Primary central sleep apnea  G47.31   2. Sleep apnea treated with nocturnal BiPAP  G47.30     Mr Mccadden is doing well with his new BiPAP machine.  He denies any difficulty with his machine but does note a leak.  He does hear an audible alarm on his BiPAP indicating that the mask is not fitting well.  He does feel that the full facemask is the best option for him at this time.  He was encouraged to continue working with his mask and headgear to find the appropriate fit.  He can monitor this on his BiPAP machine by watching for the smiley face as well as listening  for the alarm.  Compliance report reveals optimum compliance.  He was encouraged to continue using BiPAP therapy nightly and for greater than 4 hours each night.  He will continue to follow-up closely with primary care and cardiology.  He will follow-up with me in 3 months, sooner if needed.  He verbalizes understanding and agreement with this plan.   No orders of the defined types were placed in this encounter.    No orders of the defined types were placed in this encounter.     I spent 15 minutes with the patient. 50% of this time was spent counseling and educating patient on plan of care and medications.    Debbora Presto, FNP-C 12/18/2019, 3:43 PM [facility] [address], [place], [place] [address] [phone]  I reviewed the above note and documentation by the Nurse Practitioner and agree with the history, exam, assessment and plan as outlined above. I was available for consultation. Star Age, MD, PhD [facility])

## 2019-12-18 NOTE — Patient Instructions (Signed)
Please continue using your BiPAP regularly. While your insurance requires that you use BiPAP at least 4 hours each night on 70% of the nights, I recommend, that you not skip any nights and use it throughout the night if you can. Getting used to BiPAP and staying with the treatment long term does take time and patience and discipline. Untreated obstructive sleep apnea when it is moderate to severe can have an adverse impact on cardiovascular health and raise her risk for heart disease, arrhythmias, hypertension, congestive heart failure, stroke and diabetes. Untreated obstructive sleep apnea causes sleep disruption, nonrestorative sleep, and sleep deprivation. This can have an impact on your day to day functioning and cause daytime sleepiness and impairment of cognitive function, memory loss, mood disturbance, and problems focussing. Using BiPAP regularly can improve these symptoms.   Please make sure that headgear is snug and mask is fitting well. Look for green smiley face on BiPAP mask fit   Follow up in 3 months   Sleep Apnea Sleep apnea affects breathing during sleep. It causes breathing to stop for a short time or to become shallow. It can also increase the risk of:  Heart attack.  Stroke.  Being very overweight (obese).  Diabetes.  Heart failure.  Irregular heartbeat. The goal of treatment is to help you breathe normally again. What are the causes? There are three kinds of sleep apnea:  Obstructive sleep apnea. This is caused by a blocked or collapsed airway.  Central sleep apnea. This happens when the brain does not send the right signals to the muscles that control breathing.  Mixed sleep apnea. This is a combination of obstructive and central sleep apnea. The most common cause of this condition is a collapsed or blocked airway. This can happen if:  Your throat muscles are too relaxed.  Your tongue and tonsils are too large.  You are overweight.  Your airway is too  small. What increases the risk?  Being overweight.  Smoking.  Having a small airway.  Being older.  Being male.  Drinking alcohol.  Taking medicines to calm yourself (sedatives or tranquilizers).  Having family members with the condition. What are the signs or symptoms?  Trouble staying asleep.  Being sleepy or tired during the day.  Getting angry a lot.  Loud snoring.  Headaches in the morning.  Not being able to focus your mind (concentrate).  Forgetting things.  Less interest in sex.  Mood swings.  Personality changes.  Feelings of sadness (depression).  Waking up a lot during the night to pee (urinate).  Dry mouth.  Sore throat. How is this diagnosed?  Your medical history.  A physical exam.  A test that is done when you are sleeping (sleep study). The test is most often done in a sleep lab but may also be done at home. How is this treated?   Sleeping on your side.  Using a medicine to get rid of mucus in your nose (decongestant).  Avoiding the use of alcohol, medicines to help you relax, or certain pain medicines (narcotics).  Losing weight, if needed.  Changing your diet.  Not smoking.  Using a machine to open your airway while you sleep, such as: ? An oral appliance. This is a mouthpiece that shifts your lower jaw forward. ? A CPAP device. This device blows air through a mask when you breathe out (exhale). ? An EPAP device. This has valves that you put in each nostril. ? A BPAP device. This device  blows air through a mask when you breathe in (inhale) and breathe out.  Having surgery if other treatments do not work. It is important to get treatment for sleep apnea. Without treatment, it can lead to:  High blood pressure.  Coronary artery disease.  In men, not being able to have an erection (impotence).  Reduced thinking ability. Follow these instructions at home: Lifestyle  Make changes that your doctor recommends.  Eat a  healthy diet.  Lose weight if needed.  Avoid alcohol, medicines to help you relax, and some pain medicines.  Do not use any products that contain nicotine or tobacco, such as cigarettes, e-cigarettes, and chewing tobacco. If you need help quitting, ask your doctor. General instructions  Take over-the-counter and prescription medicines only as told by your doctor.  If you were given a machine to use while you sleep, use it only as told by your doctor.  If you are having surgery, make sure to tell your doctor you have sleep apnea. You may need to bring your device with you.  Keep all follow-up visits as told by your doctor. This is important. Contact a doctor if:  The machine that you were given to use during sleep bothers you or does not seem to be working.  You do not get better.  You get worse. Get help right away if:  Your chest hurts.  You have trouble breathing in enough air.  You have an uncomfortable feeling in your back, arms, or stomach.  You have trouble talking.  One side of your body feels weak.  A part of your face is hanging down. These symptoms may be an emergency. Do not wait to see if the symptoms will go away. Get medical help right away. Call your local emergency services (911 in the [place] Do not drive yourself to the hospital. Summary  This condition affects breathing during sleep.  The most common cause is a collapsed or blocked airway.  The goal of treatment is to help you breathe normally while you sleep. This information is not intended to replace advice given to you by your health care provider. Make sure you discuss any questions you have with your health care provider. Document Revised: 06/15/2018 Document Reviewed: 04/24/2018 Elsevier Patient Education  [place].

## 2019-12-23 ENCOUNTER — Encounter: Payer: Self-pay | Admitting: Cardiology

## 2019-12-23 ENCOUNTER — Ambulatory Visit: Payer: Medicare Other | Admitting: Cardiology

## 2019-12-23 ENCOUNTER — Other Ambulatory Visit: Payer: Self-pay

## 2019-12-23 VITALS — BP 153/82 | HR 60 | Temp 97.9°F | Resp 16 | Ht 73.0 in | Wt 265.9 lb

## 2019-12-23 DIAGNOSIS — Z794 Long term (current) use of insulin: Secondary | ICD-10-CM | POA: Diagnosis not present

## 2019-12-23 DIAGNOSIS — I493 Ventricular premature depolarization: Secondary | ICD-10-CM

## 2019-12-23 DIAGNOSIS — I428 Other cardiomyopathies: Secondary | ICD-10-CM | POA: Diagnosis not present

## 2019-12-23 DIAGNOSIS — I10 Essential (primary) hypertension: Secondary | ICD-10-CM | POA: Diagnosis not present

## 2019-12-23 DIAGNOSIS — N184 Chronic kidney disease, stage 4 (severe): Secondary | ICD-10-CM | POA: Diagnosis not present

## 2019-12-23 DIAGNOSIS — E1122 Type 2 diabetes mellitus with diabetic chronic kidney disease: Secondary | ICD-10-CM

## 2019-12-23 MED ORDER — CARVEDILOL 12.5 MG PO TABS: 12.5000 mg | ORAL_TABLET | Freq: Two times a day (BID) | ORAL | 2 refills | Status: DC

## 2019-12-23 NOTE — Progress Notes (Signed)
Primary Physician:  Leanna Battles, MD   Patient ID: Keith [name]., male    DOB: 01-11-1946, 74 y.o.   MRN: [id number]  Subjective:    Chief Complaint  Patient presents with  . Nonischemic Cardiomyopathy  . PCV  . Hypertension  . Follow-up    6 week    HPI: Keith [name].  is a 74 y.o. male  with HTN, HLD, DM type 2, central sleep apnea, and stage 3 CKD, recently reevaluated by [place] for frequent PVCs seen during sleep study  echocardiogram in January 2021 revealing new cardiomyopathy with EF 40 to 45%, nuclear stress test confirmed nonischemic etiology with reduced EF at 35%.  Patient also has had normal coronary arteries by angiography in 2015.  24-hour Holter monitor revealed occasional PVCs. This is a 3 month OV.   Patient is without complaints today. Denies any symptoms of chest pain or dyspnea on exertion.  He has been making dietary changes, but unfortunately has not been able to lose weight.  He is sedentary.  Previously he was on CPAP and now for the past 1 year is on BiPAP machine and has been compliant.  Past Medical History:  Diagnosis Date  . Anginal pain ([place])   . Anxiety    takes Celexa  . Arthritis    bil knees  . Benign prostatic hypertrophy    takes Flomax daily  . Bladder stones 06/25/2013   1.9cm bladder stone.  Marland Kitchen BPH with urinary obstruction 06/25/2013   127cc prostate with large middle lobe and bladder stones.   . Complication of anesthesia    hard to wake up  . Depression   . Diabetes mellitus without complication ([place])   . ED (erectile dysfunction)   . Hyperlipidemia   . Hypertension   . PONV (postoperative nausea and vomiting)   . Sleep apnea    uses bipap machine  . Spinal headache    reports last spinal headache was yesterday 05-16-18, lasted for 2 hours a, relief with tylenol , denies vision change with yesterdays occurrence but wife endorses vision changes with past spinal headaches    . Type 2 DM with CKD stage 3 and hypertension ([place])      Past Surgical History:  Procedure Laterality Date  . CARDIAC CATHETERIZATION  06-15-14  . CATARACT EXTRACTION W/ INTRAOCULAR LENS  IMPLANT, BILATERAL  03/13/2003  . COLONOSCOPY    . CYSTOSCOPY WITH LITHOLAPAXY N/A 06/25/2013   Procedure: CYSTOSCOPY WITH LITHOLAPAXY;  Surgeon: [name], MD;  [facility];  Service: Urology;  Laterality: N/A;  . KNEE ARTHROSCOPY  09/07/2010   left knee  . KNEE ARTHROSCOPY  05/27/2000   left knee  . LEFT HEART CATHETERIZATION WITH CORONARY ANGIOGRAM N/A 06/17/2014   Procedure: LEFT HEART CATHETERIZATION WITH CORONARY ANGIOGRAM;  Surgeon: Laverda Page, MD;  Location: [facility] C[facility];  Service: Cardiovascular;  Laterality: N/A;  . RETINAL DETACHMENT REPAIR W/ SCLERAL BUCKLE LE  10/14/2002   Dr Zadie Rhine  . TONSILLECTOMY     age 50  . TOTAL KNEE ARTHROPLASTY  08/01/2011   Procedure: TOTAL KNEE ARTHROPLASTY;  Surgeon: Lorn Junes, MD;  Location: [facility];  Service: Orthopedics;  Laterality: Right;  TOTAL KNEE ARTHROPLASTY  RIGHT SIDE  . TOTAL KNEE ARTHROPLASTY Left 05/22/2018   Procedure: LEFT TOTAL KNEE ARTHROPLASTY;  Surgeon: Paralee Cancel, MD;  [facility];  Service: Orthopedics;  Laterality: Left;  . TRANSURETHRAL RESECTION OF PROSTATE N/A 06/25/2013   Procedure: TRANSURETHRAL RESECTION  OF THE PROSTATE WITH GYRUS INSTRUMENTS;  Surgeon: [name], MD;  Location: [facility];  Service: Urology;  Laterality: N/A;  . VASECTOMY  1982     Tobacco Use: Low Risk   . Smoking Tobacco Use: Never Smoker  . Smokeless Tobacco Use: Never Used  Marital Atatus: Married   Review of Systems  Cardiovascular: Positive for leg swelling. Negative for chest pain and dyspnea on exertion.  Gastrointestinal: Negative for melena.   Objective:  Blood pressure (!) 153/82, pulse 60, temperature 97.9 F (36.6 C), temperature source Temporal, resp. rate 16, height 6\' 1"  (1.854 m), weight 265 lb 14.4 oz (120.6 kg), SpO2 97 %. Body mass index is 35.08 kg/m.   Vitals with  BMI 12/23/2019 12/18/2019 10/30/2019  Height 6\' 1"  6\' 1"  6\' 1"   Weight 265 lbs 14 oz 260 lbs 266 lbs  BMI 35.09 66.44 03.4  Systolic 742 595 638  Diastolic 82 86 71  Pulse 60 74 57      Physical Exam  Constitutional: Vital signs are normal. He appears well-developed and well-nourished.  Well built and morbidly obese  Cardiovascular: Normal rate, regular rhythm, normal heart sounds, intact distal pulses and normal pulses.  2+ bilateral pitting leg edema No JVD   Pulmonary/Chest: Effort normal and breath sounds normal. No accessory muscle usage. No respiratory distress.  Abdominal: Soft. Bowel sounds are normal.  Vitals reviewed.  Radiology: No results found.  Laboratory examination:   External labs: Lipid Panel completed 06/14/2018 HDL 43 MG/DL 06/14/2018 LDL 62.000 mg 06/14/2018 Cholesterol, total 129.000 m 06/14/2018 Triglycerides 122.000 06/14/2018  A1C 6.600 % 10/09/2019  Glucose Random 275.000 M 06/20/2019 MicroAlbumin Urine 28.000 06/21/2018 MicroAlbumin/Creat 24.4 MG/ 06/21/2018  BUN 21.000 M 06/20/2019 Creatinine, Serum 1.950 MG/ 06/20/2019  CMP Latest Ref Rng & Units 05/23/2018 05/17/2018 04/17/2018  Glucose 70 - 99 mg/dL 116(H) 112(H) -  BUN 8 - 23 mg/dL 37(H) 29(H) -  Creatinine 0.61 - 1.24 mg/dL 1.97(H) 2.23(H) -  Sodium 135 - 145 mmol/L 139 137 -  Potassium 3.5 - 5.1 mmol/L 4.2 4.7 -  Chloride 98 - 111 mmol/L 108 103 -  CO2 22 - 32 mmol/L 22 25 -  Calcium 8.9 - 10.3 mg/dL 8.4(L) 9.0 -  Total Protein 6.1 - 8.1 g/dL - - 6.8  Total Bilirubin 0.2 - 1.2 mg/dL - - 0.7  Alkaline Phos 39 - 117 U/L - - -  AST 10 - 35 U/L - - 12  ALT 9 - 46 U/L - - 10   CBC Latest Ref Rng & Units 05/23/2018 05/17/2018 08/05/2015  WBC 4.0 - 10.5 K/uL 15.2(H) 8.1 9.3  Hemoglobin 13.0 - 17.0 g/dL 10.8(L) 14.1 12.5(L)  Hematocrit 39.0 - 52.0 % 32.5(L) 42.5 38.1(L)  Platelets 150 - 400 K/uL 218 287 239   Lipid Panel     Component Value Date/Time   CHOL 119 11/12/2009 0856   TRIG 75.0  11/12/2009 0856   TRIG 118 06/29/2006 1325   HDL 47.00 11/12/2009 0856   CHOLHDL 3 11/12/2009 0856   VLDL 15.0 11/12/2009 0856   LDLCALC 57 11/12/2009 0856   LDLDIRECT 141.0 01/30/2007 1455   HEMOGLOBIN A1C Lab Results  Component Value Date   HGBA1C 6.0 (H) 05/17/2018   MPG 125.5 05/17/2018   TSH No results for input(s): TSH in the last 8760 hours.  PRN Meds:. Medications Discontinued During This Encounter  Medication Reason  . Investigational - Study Medication Completed Course  . metoprolol succinate (TOPROL-XL) 25 MG 24 hr tablet  Change in therapy   Current Meds  Medication Sig  . aspirin EC 81 MG tablet Take 81 mg by mouth daily.  Marland Kitchen atorvastatin (LIPITOR) 20 MG tablet Take 20 mg by mouth daily.  . Cholecalciferol (VITAMIN D) 2000 UNITS tablet Take 2,000 Units by mouth daily.  Marland Kitchen escitalopram (LEXAPRO) 20 MG tablet Take 20 mg by mouth daily.   Marland Kitchen gabapentin (NEURONTIN) 100 MG capsule Take 1 capsule by mouth at bedtime.  . magnesium oxide (MAG-OX) 400 MG tablet Take 400 mg by mouth daily.  . methocarbamol (ROBAXIN) 500 MG tablet Take 1 tablet (500 mg total) by mouth every 6 (six) hours as needed for muscle spasms.  . nitroGLYCERIN (NITROSTAT) 0.4 MG SL tablet Place 1 tablet (0.4 mg total) under the tongue every 5 (five) minutes as needed for chest pain.  . Omega-3 Fatty Acids (FISH OIL) 1000 MG CAPS Take 1,000 mg by mouth daily.  Marland Kitchen oxybutynin (DITROPAN-XL) 10 MG 24 hr tablet daily.   . [DISCONTINUED] metoprolol succinate (TOPROL-XL) 25 MG 24 hr tablet Take 1 tablet (25 mg total) by mouth daily. Take with or immediately following a meal.    Cardiac Studies:   Coronary Angiogram  [06/17/2014]: Mild noncritical coronary artery disease involving 10-20% stenosis, mid rca 20%, normal LV systolic function.  ABI's [05/14/2014]: Normal bilateral ABI at 1.11 with normal triphasic waveform at the level of the ankle.  Lexiscan (Walking with mod Bruce)Tetrofosmin Stress Test   10/09/2019: Nondiagnostic ECG stress. Resting EKG/ECG demonstrated normal sinus rhythm. Left ventricular strain patterns present.  Peak EKG/ECG revealed no significant ST-T change from baseline abnormality. There is a fixed moderate defect in the inferior and apical regions consistent with scar versus diaphragmatic attenuation. Global hypokinesis with stress LV EF: 35%. LV is mildly dilated in both rest and stress images.  No significant change from 04/21/2014.  Intermediate risk study due to low LVEF.   24-hour Holter monitor 10/02/2019: Normal sinus rhythm.  Rare PAC and PVC.  PVC occurred both in the form of isolated PVCs and in bigeminal pattern.  No atrial fibrillation was noted. No reported symptoms  Echocardiogram 09/25/2019:  Left ventricle cavity is normal in size. Moderate concentric hypertrophy of the left ventricle. Hypokinetic global wall motion. Doppler evidence of grade I (impaired) diastolic dysfunction, normal LAP. Mildly depressed LV systolic function with visual EF 40-45%.  Left atrial cavity is moderately dilated at 4.9 cm. Trileaflet aortic valve.  Mild (Grade I) aortic regurgitation. Compared to 09/20/2017, previously LVEF was normal around 55%. AV regurgitation new.   EKG:  EKG 09/19/2019: Normal sinus rhythm at 66 bpm with first degree AV block, left axis deviation, left anterior fasicular block, poor R-wave progression cannot exclude anterior infarct old. IVCD. No evidence of ischemia. No change compared to EKG 05/2018  Assessment:     ICD-10-CM   1. Nonischemic cardiomyopathy (HCC)  I42.8 carvedilol (COREG) 12.5 MG tablet  2. Primary hypertension  I10   3. PVC (premature ventricular contraction)  I49.3   4. Type 2 diabetes mellitus with stage 4 chronic kidney disease, with long-term current use of insulin [facility])  E11.22    N18.4    Z79.4     Recommendations:   Keith [name].  is a 74 y.o. male  with HTN, HLD, DM type 2, central sleep apnea, and stage 3 CKD,  recently reevaluated by [place] for frequent PVCs seen during sleep study  echocardiogram in January 2021 revealing new cardiomyopathy with EF 40 to 45%, nuclear stress  test confirmed nonischemic etiology with reduced EF at 35%.  Patient also has had normal coronary arteries by angiography in 2015.  24-hour Holter monitor revealed occasional PVCs.  He now presents for a 62-month office visit and follow-up.  He is markedly sedentary, no clinical evidence of heart failure, he does have chronic leg edema.  I discontinue metoprolol succinate and switch him to carvedilol 12.5 mg p.o. twice daily for hypertension control.  Reviewed his external labs, lipids are well controlled, diabetes is well controlled but does have stage IV chronic kidney disease.  Hence not on ACE inhibitor or ARB.  I have extensively discussed with him regarding sedentary lifestyle, risk of obesity associated hypertension and congestive heart failure.  Suspect his cardiomyopathy and frequent PVCs noted during sleep study were related to sleep apnea, he is now compliant with BiPAP, for the past 1 year.  Previously he was on CPAP and had been compliant for the last 8 to 10 years.  I am hoping that lack of PVCs noted on the Holter monitor bears in good long-term prognosis.  I would like to see him back in 2 months for follow-up of cardiomyopathy and hypertension.  [name], MD, [facility] 12/23/2019, 4:59 PM [facility] [place] Office: [phone]

## 2020-01-08 ENCOUNTER — Other Ambulatory Visit: Payer: Self-pay

## 2020-01-08 ENCOUNTER — Inpatient Hospital Stay (HOSPITAL_COMMUNITY)
Admission: EM | Admit: 2020-01-08 | Discharge: 2020-01-11 | DRG: 661 | Disposition: A | Discharge status: Home / Self Care | Payer: Medicare Other | Attending: Family Medicine | Admitting: Family Medicine

## 2020-01-08 ENCOUNTER — Inpatient Hospital Stay (HOSPITAL_COMMUNITY): Payer: Medicare Other | Admitting: Certified Registered Nurse Anesthetist

## 2020-01-08 ENCOUNTER — Encounter (HOSPITAL_COMMUNITY): Payer: Self-pay | Admitting: Emergency Medicine

## 2020-01-08 ENCOUNTER — Emergency Department (HOSPITAL_COMMUNITY): Payer: Medicare Other

## 2020-01-08 ENCOUNTER — Encounter (HOSPITAL_COMMUNITY)
Admission: EM | Disposition: A | Discharge status: Home / Self Care | Payer: Self-pay | Source: Home / Self Care | Attending: Family Medicine

## 2020-01-08 DIAGNOSIS — N201 Calculus of ureter: Secondary | ICD-10-CM

## 2020-01-08 DIAGNOSIS — N401 Enlarged prostate with lower urinary tract symptoms: Secondary | ICD-10-CM | POA: Diagnosis not present

## 2020-01-08 DIAGNOSIS — N132 Hydronephrosis with renal and ureteral calculous obstruction: Principal | ICD-10-CM | POA: Diagnosis present

## 2020-01-08 DIAGNOSIS — Z888 Allergy status to other drugs, medicaments and biological substances status: Secondary | ICD-10-CM

## 2020-01-08 DIAGNOSIS — I509 Heart failure, unspecified: Secondary | ICD-10-CM | POA: Diagnosis not present

## 2020-01-08 DIAGNOSIS — E785 Hyperlipidemia, unspecified: Secondary | ICD-10-CM | POA: Diagnosis present

## 2020-01-08 DIAGNOSIS — Z880 Allergy status to penicillin: Secondary | ICD-10-CM

## 2020-01-08 DIAGNOSIS — I130 Hypertensive heart and chronic kidney disease with heart failure and stage 1 through stage 4 chronic kidney disease, or unspecified chronic kidney disease: Secondary | ICD-10-CM | POA: Diagnosis not present

## 2020-01-08 DIAGNOSIS — Z20822 Contact with and (suspected) exposure to covid-19: Secondary | ICD-10-CM | POA: Diagnosis not present

## 2020-01-08 DIAGNOSIS — Z66 Do not resuscitate: Secondary | ICD-10-CM | POA: Diagnosis present

## 2020-01-08 DIAGNOSIS — R52 Pain, unspecified: Secondary | ICD-10-CM | POA: Diagnosis not present

## 2020-01-08 DIAGNOSIS — N19 Unspecified kidney failure: Secondary | ICD-10-CM | POA: Diagnosis present

## 2020-01-08 DIAGNOSIS — I447 Left bundle-branch block, unspecified: Secondary | ICD-10-CM | POA: Diagnosis not present

## 2020-01-08 DIAGNOSIS — N1832 Chronic kidney disease, stage 3b: Secondary | ICD-10-CM

## 2020-01-08 DIAGNOSIS — F419 Anxiety disorder, unspecified: Secondary | ICD-10-CM | POA: Diagnosis present

## 2020-01-08 DIAGNOSIS — Z8261 Family history of arthritis: Secondary | ICD-10-CM

## 2020-01-08 DIAGNOSIS — F329 Major depressive disorder, single episode, unspecified: Secondary | ICD-10-CM | POA: Diagnosis present

## 2020-01-08 DIAGNOSIS — Z8249 Family history of ischemic heart disease and other diseases of the circulatory system: Secondary | ICD-10-CM

## 2020-01-08 DIAGNOSIS — E1159 Type 2 diabetes mellitus with other circulatory complications: Secondary | ICD-10-CM

## 2020-01-08 DIAGNOSIS — N179 Acute kidney failure, unspecified: Secondary | ICD-10-CM

## 2020-01-08 DIAGNOSIS — N210 Calculus in bladder: Secondary | ICD-10-CM | POA: Diagnosis present

## 2020-01-08 DIAGNOSIS — I10 Essential (primary) hypertension: Secondary | ICD-10-CM | POA: Diagnosis not present

## 2020-01-08 DIAGNOSIS — E1169 Type 2 diabetes mellitus with other specified complication: Secondary | ICD-10-CM

## 2020-01-08 DIAGNOSIS — Z03818 Encounter for observation for suspected exposure to other biological agents ruled out: Secondary | ICD-10-CM | POA: Diagnosis not present

## 2020-01-08 DIAGNOSIS — G4733 Obstructive sleep apnea (adult) (pediatric): Secondary | ICD-10-CM | POA: Diagnosis not present

## 2020-01-08 DIAGNOSIS — Z87892 Personal history of anaphylaxis: Secondary | ICD-10-CM

## 2020-01-08 DIAGNOSIS — I152 Hypertension secondary to endocrine disorders: Secondary | ICD-10-CM

## 2020-01-08 DIAGNOSIS — N1330 Unspecified hydronephrosis: Secondary | ICD-10-CM

## 2020-01-08 DIAGNOSIS — R0902 Hypoxemia: Secondary | ICD-10-CM | POA: Diagnosis not present

## 2020-01-08 DIAGNOSIS — E1122 Type 2 diabetes mellitus with diabetic chronic kidney disease: Secondary | ICD-10-CM

## 2020-01-08 DIAGNOSIS — N183 Chronic kidney disease, stage 3 unspecified: Secondary | ICD-10-CM

## 2020-01-08 DIAGNOSIS — G4730 Sleep apnea, unspecified: Secondary | ICD-10-CM | POA: Diagnosis present

## 2020-01-08 DIAGNOSIS — M5489 Other dorsalgia: Secondary | ICD-10-CM | POA: Diagnosis not present

## 2020-01-08 DIAGNOSIS — N200 Calculus of kidney: Secondary | ICD-10-CM

## 2020-01-08 DIAGNOSIS — M545 Low back pain: Secondary | ICD-10-CM | POA: Diagnosis not present

## 2020-01-08 HISTORY — PX: CYSTOSCOPY W/ URETERAL STENT PLACEMENT: SHX1429

## 2020-01-08 LAB — COMPREHENSIVE METABOLIC PANEL
ALT: 14 U/L (ref 0–44)
AST: 14 U/L — ABNORMAL LOW (ref 15–41)
Albumin: 3.4 g/dL — ABNORMAL LOW (ref 3.5–5.0)
Alkaline Phosphatase: 69 U/L (ref 38–126)
Anion gap: 11 (ref 5–15)
BUN: 73 mg/dL — ABNORMAL HIGH (ref 8–23)
CO2: 19 mmol/L — ABNORMAL LOW (ref 22–32)
Calcium: 8.1 mg/dL — ABNORMAL LOW (ref 8.9–10.3)
Chloride: 101 mmol/L (ref 98–111)
Creatinine, Ser: 10.68 mg/dL — ABNORMAL HIGH (ref 0.61–1.24)
GFR calc Af Amer: 5 mL/min — ABNORMAL LOW (ref >60)
GFR calc non Af Amer: 4 mL/min — ABNORMAL LOW (ref >60)
Glucose, Bld: 125 mg/dL — ABNORMAL HIGH (ref 70–99)
Potassium: 4.7 mmol/L (ref 3.5–5.1)
Sodium: 131 mmol/L — ABNORMAL LOW (ref 135–145)
Total Bilirubin: 0.6 mg/dL (ref 0.3–1.2)
Total Protein: 6.6 g/dL (ref 6.5–8.1)

## 2020-01-08 LAB — CBC WITH DIFFERENTIAL/PLATELET
Abs Immature Granulocytes: 0.08 10*3/uL — ABNORMAL HIGH (ref 0.00–0.07)
Basophils Absolute: 0 10*3/uL (ref 0.0–0.1)
Basophils Relative: 0 %
Eosinophils Absolute: 0 10*3/uL (ref 0.0–0.5)
Eosinophils Relative: 0 %
HCT: 40 % (ref 39.0–52.0)
Hemoglobin: 13 g/dL (ref 13.0–17.0)
Immature Granulocytes: 1 %
Lymphocytes Relative: 7 %
Lymphs Abs: 0.6 10*3/uL — ABNORMAL LOW (ref 0.7–4.0)
MCH: 30.7 pg (ref 26.0–34.0)
MCHC: 32.5 g/dL (ref 30.0–36.0)
MCV: 94.3 fL (ref 80.0–100.0)
Monocytes Absolute: 1 10*3/uL (ref 0.1–1.0)
Monocytes Relative: 13 %
Neutro Abs: 6.2 10*3/uL (ref 1.7–7.7)
Neutrophils Relative %: 79 %
Platelets: 158 10*3/uL (ref 150–400)
RBC: 4.24 MIL/uL (ref 4.22–5.81)
RDW: 13.2 % (ref 11.5–15.5)
WBC: 7.9 10*3/uL (ref 4.0–10.5)
nRBC: 0 % (ref 0.0–0.2)

## 2020-01-08 LAB — RESPIRATORY PANEL BY RT PCR (FLU A&B, COVID)
Influenza A by PCR: NEGATIVE
Influenza B by PCR: NEGATIVE
SARS Coronavirus 2 by RT PCR: NEGATIVE

## 2020-01-08 LAB — HEMOGLOBIN A1C
Hgb A1c MFr Bld: 7.4 % — ABNORMAL HIGH (ref 4.8–5.6)
Mean Plasma Glucose: 165.68 mg/dL

## 2020-01-08 SURGERY — CYSTOSCOPY, WITH RETROGRADE PYELOGRAM AND URETERAL STENT INSERTION
Anesthesia: General | Site: Ureter | Laterality: Bilateral

## 2020-01-08 MED ORDER — ASPIRIN EC 81 MG PO TBEC
81.0000 mg | DELAYED_RELEASE_TABLET | Freq: Every day | ORAL | Status: DC
  Administered 2020-01-09 – 2020-01-11 (×3): 81 mg via ORAL
  Filled 2020-01-08 (×3): qty 1

## 2020-01-08 MED ORDER — CARVEDILOL 12.5 MG PO TABS
12.5000 mg | ORAL_TABLET | Freq: Two times a day (BID) | ORAL | Status: DC
  Administered 2020-01-08 – 2020-01-11 (×6): 12.5 mg via ORAL
  Filled 2020-01-08 (×6): qty 1

## 2020-01-08 MED ORDER — VITAMIN D3 25 MCG (1000 UNIT) PO TABS
2000.0000 [IU] | ORAL_TABLET | Freq: Every day | ORAL | Status: DC
  Administered 2020-01-09 – 2020-01-11 (×3): 2000 [IU] via ORAL
  Filled 2020-01-08 (×3): qty 2

## 2020-01-08 MED ORDER — MODAFINIL 200 MG PO TABS
200.0000 mg | ORAL_TABLET | Freq: Every day | ORAL | Status: DC
  Administered 2020-01-09: 200 mg via ORAL
  Filled 2020-01-08 (×2): qty 1

## 2020-01-08 MED ORDER — ATORVASTATIN CALCIUM 20 MG PO TABS
20.0000 mg | ORAL_TABLET | Freq: Every day | ORAL | Status: DC
  Administered 2020-01-09 – 2020-01-11 (×3): 20 mg via ORAL
  Filled 2020-01-08 (×3): qty 1

## 2020-01-08 MED ORDER — ONDANSETRON HCL 4 MG PO TABS: 4.0000 mg | ORAL_TABLET | Freq: Four times a day (QID) | ORAL | Status: DC | PRN

## 2020-01-08 MED ORDER — FENTANYL CITRATE (PF) 100 MCG/2ML IJ SOLN: 25.0000 ug | INTRAMUSCULAR | Status: DC | PRN

## 2020-01-08 MED ORDER — PROPOFOL 10 MG/ML IV BOLUS
INTRAVENOUS | Status: DC | PRN
  Administered 2020-01-08: 200 mg via INTRAVENOUS

## 2020-01-08 MED ORDER — ACETAMINOPHEN 10 MG/ML IV SOLN
INTRAVENOUS | Status: AC
  Filled 2020-01-08: qty 100

## 2020-01-08 MED ORDER — SODIUM CHLORIDE 0.9 % IR SOLN
Status: DC | PRN
  Administered 2020-01-08: 3000 mL

## 2020-01-08 MED ORDER — SODIUM CHLORIDE 0.9 % IV SOLN: INTRAVENOUS | Status: DC

## 2020-01-08 MED ORDER — INSULIN ASPART 100 UNIT/ML ~~LOC~~ SOLN
0.0000 [IU] | SUBCUTANEOUS | Status: DC
  Administered 2020-01-09: 1 [IU] via SUBCUTANEOUS
  Filled 2020-01-08: qty 0.09

## 2020-01-08 MED ORDER — FINASTERIDE 5 MG PO TABS
5.0000 mg | ORAL_TABLET | Freq: Every day | ORAL | Status: DC
  Administered 2020-01-09 – 2020-01-11 (×3): 5 mg via ORAL
  Filled 2020-01-08 (×3): qty 1

## 2020-01-08 MED ORDER — FENTANYL CITRATE (PF) 100 MCG/2ML IJ SOLN
INTRAMUSCULAR | Status: AC
  Filled 2020-01-08: qty 2

## 2020-01-08 MED ORDER — MAGNESIUM OXIDE 400 (241.3 MG) MG PO TABS
400.0000 mg | ORAL_TABLET | Freq: Every day | ORAL | Status: DC
  Administered 2020-01-09 – 2020-01-11 (×3): 400 mg via ORAL
  Filled 2020-01-08 (×3): qty 1

## 2020-01-08 MED ORDER — ONDANSETRON HCL 4 MG/2ML IJ SOLN
INTRAMUSCULAR | Status: DC | PRN
  Administered 2020-01-08: 4 mg via INTRAVENOUS

## 2020-01-08 MED ORDER — CIPROFLOXACIN IN D5W 400 MG/200ML IV SOLN
INTRAVENOUS | Status: AC
  Filled 2020-01-08: qty 200

## 2020-01-08 MED ORDER — ACETAMINOPHEN 10 MG/ML IV SOLN
1000.0000 mg | Freq: Once | INTRAVENOUS | Status: AC
  Administered 2020-01-08: 1000 mg via INTRAVENOUS

## 2020-01-08 MED ORDER — PROPOFOL 10 MG/ML IV BOLUS
INTRAVENOUS | Status: AC
  Filled 2020-01-08: qty 20

## 2020-01-08 MED ORDER — IOHEXOL 300 MG/ML  SOLN
INTRAMUSCULAR | Status: DC | PRN
  Administered 2020-01-08: 50 mL

## 2020-01-08 MED ORDER — LIDOCAINE 2% (20 MG/ML) 5 ML SYRINGE
INTRAMUSCULAR | Status: AC
  Filled 2020-01-08: qty 5

## 2020-01-08 MED ORDER — GABAPENTIN 100 MG PO CAPS: 100.0000 mg | ORAL_CAPSULE | Freq: Every day | ORAL | Status: DC | PRN

## 2020-01-08 MED ORDER — OXYBUTYNIN CHLORIDE ER 5 MG PO TB24
10.0000 mg | ORAL_TABLET | Freq: Every day | ORAL | Status: DC
  Administered 2020-01-08 – 2020-01-10 (×3): 10 mg via ORAL
  Filled 2020-01-08 (×3): qty 2

## 2020-01-08 MED ORDER — FENTANYL CITRATE (PF) 100 MCG/2ML IJ SOLN
INTRAMUSCULAR | Status: DC | PRN
  Administered 2020-01-08 (×2): 50 ug via INTRAVENOUS

## 2020-01-08 MED ORDER — ACETAMINOPHEN 650 MG RE SUPP: 650.0000 mg | Freq: Four times a day (QID) | RECTAL | Status: DC | PRN

## 2020-01-08 MED ORDER — NITROGLYCERIN 0.4 MG SL SUBL: 0.4000 mg | SUBLINGUAL_TABLET | SUBLINGUAL | Status: DC | PRN

## 2020-01-08 MED ORDER — ONDANSETRON HCL 4 MG/2ML IJ SOLN
4.0000 mg | Freq: Four times a day (QID) | INTRAMUSCULAR | Status: DC | PRN
  Administered 2020-01-08: 4 mg via INTRAVENOUS
  Filled 2020-01-08: qty 2

## 2020-01-08 MED ORDER — ESCITALOPRAM OXALATE 20 MG PO TABS
20.0000 mg | ORAL_TABLET | Freq: Every day | ORAL | Status: DC
  Administered 2020-01-09 – 2020-01-11 (×3): 20 mg via ORAL
  Filled 2020-01-08 (×3): qty 1

## 2020-01-08 MED ORDER — LIDOCAINE 2% (20 MG/ML) 5 ML SYRINGE
INTRAMUSCULAR | Status: DC | PRN
  Administered 2020-01-08: 100 mg via INTRAVENOUS

## 2020-01-08 MED ORDER — HYDROMORPHONE HCL 1 MG/ML IJ SOLN: 1.0000 mg | INTRAMUSCULAR | Status: DC | PRN

## 2020-01-08 MED ORDER — ONDANSETRON HCL 4 MG/2ML IJ SOLN
INTRAMUSCULAR | Status: AC
  Filled 2020-01-08: qty 2

## 2020-01-08 MED ORDER — DEXAMETHASONE SODIUM PHOSPHATE 10 MG/ML IJ SOLN
INTRAMUSCULAR | Status: DC | PRN
  Administered 2020-01-08: 4 mg via INTRAVENOUS

## 2020-01-08 MED ORDER — ACETAMINOPHEN 325 MG PO TABS
650.0000 mg | ORAL_TABLET | Freq: Four times a day (QID) | ORAL | Status: DC | PRN
  Administered 2020-01-09 – 2020-01-10 (×2): 650 mg via ORAL
  Filled 2020-01-08 (×3): qty 2

## 2020-01-08 MED ORDER — OXYCODONE HCL 5 MG PO TABS: 5.0000 mg | ORAL_TABLET | ORAL | Status: DC | PRN

## 2020-01-08 MED ORDER — CIPROFLOXACIN IN D5W 400 MG/200ML IV SOLN
INTRAVENOUS | Status: DC | PRN
  Administered 2020-01-08: 400 mg via INTRAVENOUS

## 2020-01-08 SURGICAL SUPPLY — 12 items
BAG URO CATCHER STRL LF (MISCELLANEOUS) ×2 IMPLANT
CATH INTERMIT  6FR 70CM (CATHETERS) ×2 IMPLANT
CLOTH BEACON ORANGE TIMEOUT ST (SAFETY) ×2 IMPLANT
GLOVE BIOGEL M STRL SZ7.5 (GLOVE) ×2 IMPLANT
GOWN STRL REUS W/TWL XL LVL3 (GOWN DISPOSABLE) ×4 IMPLANT
GUIDEWIRE STR DUAL SENSOR (WIRE) ×2 IMPLANT
KIT TURNOVER KIT A (KITS) IMPLANT
MANIFOLD NEPTUNE II (INSTRUMENTS) ×2 IMPLANT
STENT URET 6FRX26 CONTOUR (STENTS) ×4 IMPLANT
TRAY CYSTO PACK (CUSTOM PROCEDURE TRAY) ×2 IMPLANT
TRAY FOLEY MTR SLVR 16FR STAT (SET/KITS/TRAYS/PACK) ×1 IMPLANT
TUBING CONNECTING 10 (TUBING) ×2 IMPLANT

## 2020-01-08 NOTE — Op Note (Signed)
Preoperative diagnosis: Bilateral ureteral stones and renal failure Postoperative diagnosis: Bilateral ureteral stones and renal failure Surgery: Cystoscopy bilateral retrograde ureterogram's and bilateral ureteral stent insertion Surgeon: Dr. Nicki Reaper Joetta Delprado  The patient has the above diagnosis and consented the above procedure.  He was not infected but I gave him a loading dose of ciprofloxacin.  Care was taken leg positioning  24 French cystoscope was used.  Penile bulbar membranous urethra normal.  Bilobar enlargement of the prostate.  He had a previous prostate resection.  He had a hypertrophied trigone.  Mild bladder trabeculation.  Sediment in the bladder that was irrigated.  I passed a sensor wire to the lower right ureter under fluoroscopic and cystoscopic guidance.  I then passed an open-ended ureteral catheter over the wire removing the wire.  I did a retrograde and there was no distal dilation and mild hydronephrosis in the proximal right renal pelvis.  Sensor wire was delivered to the upper pole calyx.  Open-ended ureteral catheter was removed.  26 x 6 stent was inserted fluoroscopically and cystoscopically.  It curled nicely in the right renal pelvis and in the bladder.  Identical procedure was done on the left side.  I did a retrograde and there was no dilation.  I could not see any filling defects distally.  I passed a wire curling in the upper pole calyx.  I passed a 26 x 6 stent into the left renal pelvis and in the bladder.  He is just over 6 feet tall and a place a stent quite far in.  At the end of the case he did curl once but was still in the left renal pelvis.  He had a lot of belly breathing and I triple checked to make sure that the stent was in the correct position.  I then placed a 16 French Foley catheter easily.  Patient will be taken to recovery.

## 2020-01-08 NOTE — ED Notes (Signed)
Just asked PT for urine and he stated that he would try to void.

## 2020-01-08 NOTE — Transfer of Care (Signed)
Immediate Anesthesia Transfer of Care Note  Patient: Keith [name].  Procedure(s) Performed: CYSTOSCOPY WITH bilateral  RETROGRADE PYELOGRAM/ bilateral URETERAL STENT PLACEMENT (Bilateral Ureter)  Patient Location: PACU  Anesthesia Type:General  Level of Consciousness: drowsy and patient cooperative  Airway & Oxygen Therapy: Patient Spontanous Breathing and Patient connected to face mask oxygen  Post-op Assessment: Report given to RN and Post -op Vital signs reviewed and stable  Post vital signs: Reviewed and stable  Last Vitals:  Vitals Value Taken Time  BP 147/81 01/08/20 2118  Temp    Pulse 85 01/08/20 2120  Resp 21 01/08/20 2120  SpO2 97 % 01/08/20 2120  Vitals shown include unvalidated device data.  Last Pain:  Vitals:   01/08/20 1059  TempSrc: Oral  PainSc: 2          Complications: No apparent anesthesia complications

## 2020-01-08 NOTE — Anesthesia Preprocedure Evaluation (Addendum)
Anesthesia Evaluation  Patient identified by MRN, date of birth, ID band Patient awake    Reviewed: Allergy & Precautions, NPO status , Patient's Chart, lab work & pertinent test results  History of Anesthesia Complications (+) PONV  Airway Mallampati: II  TM Distance: >3 FB Neck ROM: Full    Dental no notable dental hx.    Pulmonary sleep apnea ,    Pulmonary exam normal breath sounds clear to auscultation       Cardiovascular hypertension, +CHF  Normal cardiovascular exam+ Valvular Problems/Murmurs AI  Rhythm:Regular Rate:Normal  EF 35-40%   Neuro/Psych negative neurological ROS  negative psych ROS   GI/Hepatic negative GI ROS, Neg liver ROS,   Endo/Other  diabetes  Renal/GU ARFRenal disease  negative genitourinary   Musculoskeletal negative musculoskeletal ROS (+)   Abdominal   Peds negative pediatric ROS (+)  Hematology negative hematology ROS (+)   Anesthesia Other Findings   Reproductive/Obstetrics negative OB ROS                            Anesthesia Physical Anesthesia Plan  ASA: IV and emergent  Anesthesia Plan: General   Post-op Pain Management:    Induction: Intravenous and Rapid sequence  PONV Risk Score and Plan: 3 and Ondansetron, Dexamethasone and Treatment may vary due to age or medical condition  Airway Management Planned: Oral ETT  Additional Equipment:   Intra-op Plan:   Post-operative Plan: Extubation in OR  Informed Consent: I have reviewed the patients History and Physical, chart, labs and discussed the procedure including the risks, benefits and alternatives for the proposed anesthesia with the patient or authorized representative who has indicated his/her understanding and acceptance.     Dental advisory given  Plan Discussed with: CRNA and Surgeon  Anesthesia Plan Comments:         Anesthesia Quick Evaluation

## 2020-01-08 NOTE — Consult Note (Signed)
Urology Consult  Referring physician: Willette Cluster Reason for referral: renal failure ureteral stone  Chief Complaint: ureteral stone renal failure  History of Present Illness: back pain; found to be in renal failure with bilateral stones and atrophic left kidney; patient previous bladder stone and TURP Dr Jeffie Pollock; appointment tomorrow Came in with acute diffuse back pain and nausea and not eating well   WBC 15.2 Base Cr 1.97; today Cr 10.68 CT: mild rt hydro and proximal ureter 5 mm stone; atrophic left kidney with and two 1-2 mm distal left ureteral stones and a more proximal stone in left ureter and no hydro   Past Medical History:  Diagnosis Date  . Anginal pain ([place])   . Anxiety    takes Celexa  . Arthritis    bil knees  . Benign prostatic hypertrophy    takes Flomax daily  . Bladder stones 06/25/2013   1.9cm bladder stone.  Marland Kitchen BPH with urinary obstruction 06/25/2013   127cc prostate with large middle lobe and bladder stones.   . Complication of anesthesia    hard to wake up  . Depression   . Diabetes mellitus without complication ([place])   . ED (erectile dysfunction)   . Hyperlipidemia   . Hypertension   . PONV (postoperative nausea and vomiting)   . Sleep apnea    uses bipap machine  . Spinal headache    reports last spinal headache was yesterday 05-16-18, lasted for 2 hours a, relief with tylenol , denies vision change with yesterdays occurrence but wife endorses vision changes with past spinal headaches    . Type 2 DM with CKD stage 3 and hypertension ([place])    Past Surgical History:  Procedure Laterality Date  . CARDIAC CATHETERIZATION  06-15-14  . CATARACT EXTRACTION W/ INTRAOCULAR LENS  IMPLANT, BILATERAL  03/13/2003  . COLONOSCOPY    . CYSTOSCOPY WITH LITHOLAPAXY N/A 06/25/2013   Procedure: CYSTOSCOPY WITH LITHOLAPAXY;  Surgeon: [name], MD;  [facility];  Service: Urology;  Laterality: N/A;  . KNEE ARTHROSCOPY  09/07/2010   left knee  . KNEE ARTHROSCOPY   05/27/2000   left knee  . LEFT HEART CATHETERIZATION WITH CORONARY ANGIOGRAM N/A 06/17/2014   Procedure: LEFT HEART CATHETERIZATION WITH CORONARY ANGIOGRAM;  Surgeon: Laverda Page, MD;  Location: [facility] CATH LAB;  Service: Cardiovascular;  Laterality: N/A;  . RETINAL DETACHMENT REPAIR W/ SCLERAL BUCKLE LE  10/14/2002   Dr Zadie Rhine  . TONSILLECTOMY     age 74  . TOTAL KNEE ARTHROPLASTY  08/01/2011   Procedure: TOTAL KNEE ARTHROPLASTY;  Surgeon: Lorn Junes, MD;  Location: [facility];  Service: Orthopedics;  Laterality: Right;  TOTAL KNEE ARTHROPLASTY  RIGHT SIDE  . TOTAL KNEE ARTHROPLASTY Left 05/22/2018   Procedure: LEFT TOTAL KNEE ARTHROPLASTY;  Surgeon: Paralee Cancel, MD;  [facility];  Service: Orthopedics;  Laterality: Left;  . TRANSURETHRAL RESECTION OF PROSTATE N/A 06/25/2013   Procedure: TRANSURETHRAL RESECTION OF THE PROSTATE WITH GYRUS INSTRUMENTS;  Surgeon: [name], MD;  [facility];  Service: Urology;  Laterality: N/A;  . VASECTOMY  1982    Medications: I have reviewed the patient's current medications. Allergies:  Allergies  Allergen Reactions  . Penicillins Anaphylaxis    eyes swell shut  Has patient had a PCN reaction causing immediate rash, facial/tongue/throat swelling, SOB or lightheadedness with hypotension: yes Has patient had a PCN reaction causing severe rash involving mucus membranes or skin necrosis: no Has patient had a PCN reaction that required  hospitalization no Has patient had a PCN reaction occurring within the last 10 years: no If all of the above answers are "NO", then may proceed with Cephalosporin use.  Judeth Cornfield Reductase Inhibitors     Family History  Problem Relation Age of Onset  . Hypertension Mother   . Heart failure Mother   . Heart failure Father   . Arthritis Other   . Cancer Other        breast, skin  . Coronary artery disease Other   . Heart disease Other   . Anesthesia problems Neg Hx   . Hypotension Neg Hx   .  Malignant hyperthermia Neg Hx   . Pseudochol deficiency Neg Hx    Social History:  reports that he has never smoked. He has never used smokeless tobacco. He reports current alcohol use. He reports that he does not use drugs.  ROS: All systems are reviewed and negative except as noted. Rest negative  Physical Exam:  Vital signs in last 24 hours: Temp:  [98.2 F (36.8 C)] 98.2 F (36.8 C) (04/28 1059) Pulse Rate:  [57-60] 57 (04/28 1700) Resp:  [15-18] 15 (04/28 1700) BP: (125-151)/(66-73) 151/73 (04/28 1700) SpO2:  [95 %-99 %] 97 % (04/28 1700)  Cardiovascular: Skin warm; not flushed Respiratory: Breaths quiet; no shortness of breath Abdomen: No masses Neurological: Normal sensation to touch Musculoskeletal: Normal motor function arms and legs Lymphatics: No inguinal adenopathy Skin: No rashes Genitourinary:nontoxic; no belly tender; srotum normal  Laboratory Data:  Results for orders placed or performed during the hospital encounter of 01/08/20 (from the past 72 hour(s))  CBC with Differential     Status: Abnormal   Collection Time: 01/08/20  3:46 PM  Result Value Ref Range   WBC 7.9 4.0 - 10.5 K/uL   RBC 4.24 4.22 - 5.81 MIL/uL   Hemoglobin 13.0 13.0 - 17.0 g/dL   HCT 40.0 39.0 - 52.0 %   MCV 94.3 80.0 - 100.0 fL   MCH 30.7 26.0 - 34.0 pg   MCHC 32.5 30.0 - 36.0 g/dL   RDW 13.2 11.5 - 15.5 %   Platelets 158 150 - 400 K/uL   nRBC 0.0 0.0 - 0.2 %   Neutrophils Relative % 79 %   Neutro Abs 6.2 1.7 - 7.7 K/uL   Lymphocytes Relative 7 %   Lymphs Abs 0.6 (L) 0.7 - 4.0 K/uL   Monocytes Relative 13 %   Monocytes Absolute 1.0 0.1 - 1.0 K/uL   Eosinophils Relative 0 %   Eosinophils Absolute 0.0 0.0 - 0.5 K/uL   Basophils Relative 0 %   Basophils Absolute 0.0 0.0 - 0.1 K/uL   Immature Granulocytes 1 %   Abs Immature Granulocytes 0.08 (H) 0.00 - 0.07 K/uL    Comment: Performed at [facility], [place] [address] [place], [place] [address]  Comprehensive  metabolic panel     Status: Abnormal   Collection Time: 01/08/20  3:46 PM  Result Value Ref Range   Sodium 131 (L) 135 - 145 mmol/L   Potassium 4.7 3.5 - 5.1 mmol/L   Chloride 101 98 - 111 mmol/L   CO2 19 (L) 22 - 32 mmol/L   Glucose, Bld 125 (H) 70 - 99 mg/dL    Comment: Glucose reference range applies only to samples taken after fasting for at least 8 hours.   BUN 73 (H) 8 - 23 mg/dL   Creatinine, Ser 10.68 (H) 0.61 - 1.24 mg/dL   Calcium 8.1 (  L) 8.9 - 10.3 mg/dL   Total Protein 6.6 6.5 - 8.1 g/dL   Albumin 3.4 (L) 3.5 - 5.0 g/dL   AST 14 (L) 15 - 41 U/L   ALT 14 0 - 44 U/L   Alkaline Phosphatase 69 38 - 126 U/L   Total Bilirubin 0.6 0.3 - 1.2 mg/dL   GFR calc non Af Amer 4 (L) >60 mL/min   GFR calc Af Amer 5 (L) >60 mL/min   Anion gap 11 5 - 15    Comment: Performed at [facility], [place] [address], [place], [place] [address]   No results found for this or any previous visit (from the past 240 hour(s)). Creatinine: Recent Labs    01/08/20 1546  CREATININE 10.68*    Xrays: See report/chart See above  Impression/Assessment:  Picture drawn; bilateral stones and poorly fx left kidney  Plan:  Cysto retro bilateral stents Pros cons risks perc sequelae injury etc Medical admission for renal falure  Tessi Eustache A Jimesha Rising 01/08/2020, 6:24 PM

## 2020-01-08 NOTE — ED Notes (Signed)
ED TO INPATIENT HANDOFF REPORT  ED Nurse Name and Phone #: Luetta Nutting, RN  S Name/Age/Gender Keith [name] 74 y.o. male Room/Bed: [facility]/NONE  Code Status   Code Status: DNR  Home/SNF/Other Home Patient oriented to: self, place, time and situation Is this baseline? Yes   Triage Complete: Triage complete  Chief Complaint Ureterolithiasis [N20.1] Acute renal failure, unspecified acute renal failure type ([place]) [N17.9] Acute renal failure superimposed on stage 3b chronic kidney disease ([place]) [N17.9, N18.32]  Triage Note Per EMS-states lower back pain since Sunday-states he has a kidney stone-states left arm pain with kidney stone as well-painful and difficulty urinating-100 mcg of Fentanyl given in route    Allergies Allergies  Allergen Reactions  . Penicillins Anaphylaxis    eyes swell shut  Has patient had a PCN reaction causing immediate rash, facial/tongue/throat swelling, SOB or lightheadedness with hypotension: yes Has patient had a PCN reaction causing severe rash involving mucus membranes or skin necrosis: no Has patient had a PCN reaction that required hospitalization no Has patient had a PCN reaction occurring within the last 10 years: no If all of the above answers are "NO", then may proceed with Cephalosporin use.  Judeth Cornfield Reductase Inhibitors     Level of Care/Admitting Diagnosis ED Disposition    ED Disposition Condition Comment   Admit  Hospital Area: [facility] [[id number]]  Level of Care: Med-Surg [16]  May admit patient to [facility] or [facility] if equivalent level of care is available:: No  Covid Evaluation: Asymptomatic Screening Protocol (No Symptoms)  Diagnosis: Acute renal failure superimposed on stage 3b chronic kidney disease [facility]) [[id number]]  Admitting Physician: Lenore Cordia [[id number]]  Attending Physician: Lenore Cordia [[id number]]  Estimated length of stay: past midnight tomorrow  Certification:: I certify this patient  will need inpatient services for at least 2 midnights       B Medical/Surgery History Past Medical History:  Diagnosis Date  . Anginal pain ([place])   . Anxiety    takes Celexa  . Arthritis    bil knees  . Benign prostatic hypertrophy    takes Flomax daily  . Bladder stones 06/25/2013   1.9cm bladder stone.  Marland Kitchen BPH with urinary obstruction 06/25/2013   127cc prostate with large middle lobe and bladder stones.   . Complication of anesthesia    hard to wake up  . Depression   . Diabetes mellitus without complication ([place])   . ED (erectile dysfunction)   . Hyperlipidemia   . Hypertension   . PONV (postoperative nausea and vomiting)   . Sleep apnea    uses bipap machine  . Spinal headache    reports last spinal headache was yesterday 05-16-18, lasted for 2 hours a, relief with tylenol , denies vision change with yesterdays occurrence but wife endorses vision changes with past spinal headaches    . Type 2 DM with CKD stage 3 and hypertension ([place])    Past Surgical History:  Procedure Laterality Date  . CARDIAC CATHETERIZATION  06-15-14  . CATARACT EXTRACTION W/ INTRAOCULAR LENS  IMPLANT, BILATERAL  03/13/2003  . COLONOSCOPY    . CYSTOSCOPY WITH LITHOLAPAXY N/A 06/25/2013   Procedure: CYSTOSCOPY WITH LITHOLAPAXY;  Surgeon: [name], MD;  [facility];  Service: Urology;  Laterality: N/A;  . KNEE ARTHROSCOPY  09/07/2010   left knee  . KNEE ARTHROSCOPY  05/27/2000   left knee  . LEFT HEART CATHETERIZATION WITH CORONARY ANGIOGRAM N/A 06/17/2014   Procedure:  LEFT HEART CATHETERIZATION WITH CORONARY ANGIOGRAM;  Surgeon: Laverda Page, MD;  Location[facility];  Service: Cardiovascular;  Laterality: N/A;  . RETINAL DETACHMENT REPAIR W/ SCLERAL BUCKLE LE  10/14/2002   Dr Zadie Rhine  . TONSILLECTOMY     age 31  . TOTAL KNEE ARTHROPLASTY  08/01/2011   Procedure: TOTAL KNEE ARTHROPLASTY;  Surgeon: Lorn Junes, MD;  Location: [place];  Service: Orthopedics;  Laterality: Right;   TOTAL KNEE ARTHROPLASTY  RIGHT SIDE  . TOTAL KNEE ARTHROPLASTY Left 05/22/2018   Procedure: LEFT TOTAL KNEE ARTHROPLASTY;  Surgeon: Paralee Cancel, MD;  [facility];  Service: Orthopedics;  Laterality: Left;  . TRANSURETHRAL RESECTION OF PROSTATE N/A 06/25/2013   Procedure: TRANSURETHRAL RESECTION OF THE PROSTATE WITH GYRUS INSTRUMENTS;  Surgeon: [name], MD;  [facility];  Service: Urology;  Laterality: N/A;  . VASECTOMY  1982     A IV Location/Drains/Wounds Patient Lines/Drains/Airways Status   Active Line/Drains/Airways    Name:   Placement date:   Placement time:   Site:   Days:   Peripheral IV 01/08/20 Left Antecubital   01/08/20    2026    Antecubital   less than 1   Incision (Closed) 05/22/18 Knee Left   05/22/18    1500     596          Intake/Output Last 24 hours No intake or output data in the 24 hours ending 01/08/20 2030  Labs/Imaging Results for orders placed or performed during the hospital encounter of 01/08/20 (from the past 48 hour(s))  CBC with Differential     Status: Abnormal   Collection Time: 01/08/20  3:46 PM  Result Value Ref Range   WBC 7.9 4.0 - 10.5 K/uL   RBC 4.24 4.22 - 5.81 MIL/uL   Hemoglobin 13.0 13.0 - 17.0 g/dL   HCT 40.0 39.0 - 52.0 %   MCV 94.3 80.0 - 100.0 fL   MCH 30.7 26.0 - 34.0 pg   MCHC 32.5 30.0 - 36.0 g/dL   RDW 13.2 11.5 - 15.5 %   Platelets 158 150 - 400 K/uL   nRBC 0.0 0.0 - 0.2 %   Neutrophils Relative % 79 %   Neutro Abs 6.2 1.7 - 7.7 K/uL   Lymphocytes Relative 7 %   Lymphs Abs 0.6 (L) 0.7 - 4.0 K/uL   Monocytes Relative 13 %   Monocytes Absolute 1.0 0.1 - 1.0 K/uL   Eosinophils Relative 0 %   Eosinophils Absolute 0.0 0.0 - 0.5 K/uL   Basophils Relative 0 %   Basophils Absolute 0.0 0.0 - 0.1 K/uL   Immature Granulocytes 1 %   Abs Immature Granulocytes 0.08 (H) 0.00 - 0.07 K/uL    Comment: Performed at [facility], [place] [address] [place], [place] [address]  Comprehensive metabolic panel      Status: Abnormal   Collection Time: 01/08/20  3:46 PM  Result Value Ref Range   Sodium 131 (L) 135 - 145 mmol/L   Potassium 4.7 3.5 - 5.1 mmol/L   Chloride 101 98 - 111 mmol/L   CO2 19 (L) 22 - 32 mmol/L   Glucose, Bld 125 (H) 70 - 99 mg/dL    Comment: Glucose reference range applies only to samples taken after fasting for at least 8 hours.   BUN 73 (H) 8 - 23 mg/dL   Creatinine, Ser 10.68 (H) 0.61 - 1.24 mg/dL   Calcium 8.1 (L) 8.9 - 10.3 mg/dL  Total Protein 6.6 6.5 - 8.1 g/dL   Albumin 3.4 (L) 3.5 - 5.0 g/dL   AST 14 (L) 15 - 41 U/L   ALT 14 0 - 44 U/L   Alkaline Phosphatase 69 38 - 126 U/L   Total Bilirubin 0.6 0.3 - 1.2 mg/dL   GFR calc non Af Amer 4 (L) >60 mL/min   GFR calc Af Amer 5 (L) >60 mL/min   Anion gap 11 5 - 15    Comment: Performed at [facility], [place] [address], [place], [place] [address]  Respiratory Panel by RT PCR (Flu A&B, Covid) - Nasopharyngeal Swab     Status: None   Collection Time: 01/08/20  6:18 PM   Specimen: Nasopharyngeal Swab  Result Value Ref Range   SARS Coronavirus 2 by RT PCR NEGATIVE NEGATIVE    Comment: (NOTE) SARS-CoV-2 target nucleic acids are NOT DETECTED. The SARS-CoV-2 RNA is generally detectable in upper respiratoy specimens during the acute phase of infection. The lowest concentration of SARS-CoV-2 viral copies this assay can detect is 131 copies/mL. A negative result does not preclude SARS-Cov-2 infection and should not be used as the sole basis for treatment or other patient management decisions. A negative result may occur with  improper specimen collection/handling, submission of specimen other than nasopharyngeal swab, presence of viral mutation(s) within the areas targeted by this assay, and inadequate number of viral copies (<131 copies/mL). A negative result must be combined with clinical observations, patient history, and epidemiological information. The expected result is Negative. Fact Sheet for  Patients:  PinkCheek.be Fact Sheet for Healthcare Providers:  GravelBags.it This test is not yet ap proved or cleared by the [place] FDA and  has been authorized for detection and/or diagnosis of SARS-CoV-2 by FDA under an Emergency Use Authorization (EUA). This EUA will remain  in effect (meaning this test can be used) for the duration of the COVID-19 declaration under Section 564(b)(1) of the Act, 21 U.S.C. section 360bbb-3(b)(1), unless the authorization is terminated or revoked sooner.    Influenza A by PCR NEGATIVE NEGATIVE   Influenza B by PCR NEGATIVE NEGATIVE    Comment: (NOTE) The Xpert Xpress SARS-CoV-2/FLU/RSV assay is intended as an aid in  the diagnosis of influenza from Nasopharyngeal swab specimens and  should not be used as a sole basis for treatment. Nasal washings and  aspirates are unacceptable for Xpert Xpress SARS-CoV-2/FLU/RSV  testing. Fact Sheet for Patients: PinkCheek.be Fact Sheet for Healthcare Providers: GravelBags.it This test is not yet approved or cleared by the [place] FDA and  has been authorized for detection and/or diagnosis of SARS-CoV-2 by  FDA under an Emergency Use Authorization (EUA). This EUA will remain  in effect (meaning this test can be used) for the duration of the  Covid-19 declaration under Section 564(b)(1) of the Act, 21  U.S.C. section 360bbb-3(b)(1), unless the authorization is  terminated or revoked. Performed at [facility], [place] [address], [place],  [address]    CT Renal Stone Study  Result Date: 01/08/2020 CLINICAL DATA:  Predominately left flank pain EXAM: CT ABDOMEN AND PELVIS WITHOUT CONTRAST TECHNIQUE: Multidetector CT imaging of the abdomen and pelvis was performed following the standard protocol without IV contrast. COMPARISON:  09/15/2016 FINDINGS: Lower chest: Mild  bibasilar atelectatic changes are noted. Hepatobiliary: No focal liver abnormality is seen. No gallstones, gallbladder wall thickening, or biliary dilatation. Pancreas: Unremarkable. No pancreatic ductal dilatation or surrounding inflammatory changes. Spleen: Normal in size without focal  abnormality. Adrenals/Urinary Tract: Adrenal glands are well visualized and within normal limits. Right kidney demonstrates very mild hydronephrosis and hydroureter secondary to a proximal 5 mm right ureteral stone. The more distal right ureter is within normal limits. The bladder is decompressed. Left kidney is somewhat atrophic without significant hydronephrosis. There are however 2 small calculi identified within the distal left ureter these measure 1-2 mm in dimension. A slightly more proximal stone is noted within the left ureter best seen on image number 95 of series 4. Stomach/Bowel: Mild diverticular changes noted without evidence of diverticulitis. No obstructive or inflammatory changes of the colon are seen. The appendix is well visualized and within normal limits. No obstructive changes are noted in the small bowel. The stomach is within normal limits. Vascular/Lymphatic: Aortic atherosclerosis. No enlarged abdominal or pelvic lymph nodes. Reproductive: Prostate is unremarkable. Other: Fat containing anterior abdominal wall hernia is noted just above the umbilicus. The hernia neck measures approximately 3.1 cm. This is stable from the prior exam. Additionally small fat containing inguinal hernias are noted bilaterally. Some fluid is noted within the right inguinal hernia. The fluid is new from the prior exam although the inguinal hernias are stable. Musculoskeletal: No acute or significant osseous findings. IMPRESSION: 5 mm right ureteral stone with hydronephrosis. Multiple tiny left ureteral calculi without significant obstructive change. Diverticulosis without diverticulitis. Stable fat containing anterior abdominal  wall and inguinal hernias. Electronically Signed   By: Inez Catalina M.D.   On: 01/08/2020 16:25    Pending Labs Unresulted Labs (From admission, onward)    Start     Ordered   01/09/20 5009  Basic metabolic panel  Tomorrow morning,   R     01/08/20 1948   01/09/20 0500  CBC  Tomorrow morning,   R     01/08/20 1948   01/08/20 1950  Hemoglobin A1c  Add-on,   AD    Comments: To assess prior glycemic control    01/08/20 1950   01/08/20 1502  Urinalysis, Routine w reflex microscopic  Once,   STAT     01/08/20 1503          Vitals/Pain Today's Vitals   01/08/20 1610 01/08/20 1700 01/08/20 1900 01/08/20 2000  BP: (!) 144/66 (!) 151/73 (!) 157/90 (!) 186/86  Pulse: (!) 58 (!) 57 75 87  Resp: 17 15 (!) 27 (!) 24  Temp:      TempSrc:      SpO2: 99% 97% 98% 98%  PainSc:        Isolation Precautions No active isolations  Medications Medications  acetaminophen (TYLENOL) tablet 650 mg ( Oral MAR Hold 01/08/20 2030)    Or  acetaminophen (TYLENOL) suppository 650 mg ( Rectal MAR Hold 01/08/20 2030)  oxyCODONE (Oxy IR/ROXICODONE) immediate release tablet 5 mg ( Oral MAR Hold 01/08/20 2030)  ondansetron (ZOFRAN) tablet 4 mg ( Oral MAR Hold 01/08/20 2030)    Or  ondansetron (ZOFRAN) injection 4 mg ( Intravenous MAR Hold 01/08/20 2030)  insulin aspart (novoLOG) injection 0-9 Units ( Subcutaneous Automatically Held 01/16/20 2000)  HYDROmorphone (DILAUDID) injection 1 mg ( Intravenous MAR Hold 01/08/20 2030)  sodium chloride irrigation 0.9 % (3,000 mLs Irrigation Given 01/08/20 2022)  ciprofloxacin (CIPRO) 400 MG/200ML IVPB (has no administration in time range)    Mobility walks Low fall risk   Focused Assessments N/A   R Recommendations: See Admitting Provider Note  Report given to: Jarrett Soho, RN  Additional Notes: N/A

## 2020-01-08 NOTE — H&P (View-Only) (Signed)
Urology Consult  Referring physician: Willette Cluster Reason for referral: renal failure ureteral stone  Chief Complaint: ureteral stone renal failure  History of Present Illness: back pain; found to be in renal failure with bilateral stones and atrophic left kidney; patient previous bladder stone and TURP Dr Jeffie Pollock; appointment tomorrow Came in with acute diffuse back pain and nausea and not eating well   WBC 15.2 Base Cr 1.97; today Cr 10.68 CT: mild rt hydro and proximal ureter 5 mm stone; atrophic left kidney with and two 1-2 mm distal left ureteral stones and a more proximal stone in left ureter and no hydro   Past Medical History:  Diagnosis Date  . Anginal pain ([place])   . Anxiety    takes Celexa  . Arthritis    bil knees  . Benign prostatic hypertrophy    takes Flomax daily  . Bladder stones 06/25/2013   1.9cm bladder stone.  Marland Kitchen BPH with urinary obstruction 06/25/2013   127cc prostate with large middle lobe and bladder stones.   . Complication of anesthesia    hard to wake up  . Depression   . Diabetes mellitus without complication ([place])   . ED (erectile dysfunction)   . Hyperlipidemia   . Hypertension   . PONV (postoperative nausea and vomiting)   . Sleep apnea    uses bipap machine  . Spinal headache    reports last spinal headache was yesterday 05-16-18, lasted for 2 hours a, relief with tylenol , denies vision change with yesterdays occurrence but wife endorses vision changes with past spinal headaches    . Type 2 DM with CKD stage 3 and hypertension ([place])    Past Surgical History:  Procedure Laterality Date  . CARDIAC CATHETERIZATION  06-15-14  . CATARACT EXTRACTION W/ INTRAOCULAR LENS  IMPLANT, BILATERAL  03/13/2003  . COLONOSCOPY    . CYSTOSCOPY WITH LITHOLAPAXY N/A 06/25/2013   Procedure: CYSTOSCOPY WITH LITHOLAPAXY;  Surgeon: [name], MD;  [facility];  Service: Urology;  Laterality: N/A;  . KNEE ARTHROSCOPY  09/07/2010   left knee  . KNEE ARTHROSCOPY   05/27/2000   left knee  . LEFT HEART CATHETERIZATION WITH CORONARY ANGIOGRAM N/A 06/17/2014   Procedure: LEFT HEART CATHETERIZATION WITH CORONARY ANGIOGRAM;  Surgeon: Laverda Page, MD;  Location: [place] CATH LAB;  Service: Cardiovascular;  Laterality: N/A;  . RETINAL DETACHMENT REPAIR W/ SCLERAL BUCKLE LE  10/14/2002   Dr Zadie Rhine  . TONSILLECTOMY     age 18  . TOTAL KNEE ARTHROPLASTY  08/01/2011   Procedure: TOTAL KNEE ARTHROPLASTY;  Surgeon: Lorn Junes, MD;  Location: [place];  Service: Orthopedics;  Laterality: Right;  TOTAL KNEE ARTHROPLASTY  RIGHT SIDE  . TOTAL KNEE ARTHROPLASTY Left 05/22/2018   Procedure: LEFT TOTAL KNEE ARTHROPLASTY;  Surgeon: Paralee Cancel, MD;  [facility];  Service: Orthopedics;  Laterality: Left;  . TRANSURETHRAL RESECTION OF PROSTATE N/A 06/25/2013   Procedure: TRANSURETHRAL RESECTION OF THE PROSTATE WITH GYRUS INSTRUMENTS;  Surgeon: [name], MD;  [facility];  Service: Urology;  Laterality: N/A;  . VASECTOMY  1982    Medications: I have reviewed the patient's current medications. Allergies:  Allergies  Allergen Reactions  . Penicillins Anaphylaxis    eyes swell shut  Has patient had a PCN reaction causing immediate rash, facial/tongue/throat swelling, SOB or lightheadedness with hypotension: yes Has patient had a PCN reaction causing severe rash involving mucus membranes or skin necrosis: no Has patient had a PCN reaction that required  hospitalization no Has patient had a PCN reaction occurring within the last 10 years: no If all of the above answers are "NO", then may proceed with Cephalosporin use.  Judeth Cornfield Reductase Inhibitors     Family History  Problem Relation Age of Onset  . Hypertension Mother   . Heart failure Mother   . Heart failure Father   . Arthritis Other   . Cancer Other        breast, skin  . Coronary artery disease Other   . Heart disease Other   . Anesthesia problems Neg Hx   . Hypotension Neg Hx   .  Malignant hyperthermia Neg Hx   . Pseudochol deficiency Neg Hx    Social History:  reports that he has never smoked. He has never used smokeless tobacco. He reports current alcohol use. He reports that he does not use drugs.  ROS: All systems are reviewed and negative except as noted. Rest negative  Physical Exam:  Vital signs in last 24 hours: Temp:  [98.2 F (36.8 C)] 98.2 F (36.8 C) (04/28 1059) Pulse Rate:  [57-60] 57 (04/28 1700) Resp:  [15-18] 15 (04/28 1700) BP: (125-151)/(66-73) 151/73 (04/28 1700) SpO2:  [95 %-99 %] 97 % (04/28 1700)  Cardiovascular: Skin warm; not flushed Respiratory: Breaths quiet; no shortness of breath Abdomen: No masses Neurological: Normal sensation to touch Musculoskeletal: Normal motor function arms and legs Lymphatics: No inguinal adenopathy Skin: No rashes Genitourinary:nontoxic; no belly tender; srotum normal  Laboratory Data:  Results for orders placed or performed during the hospital encounter of 01/08/20 (from the past 72 hour(s))  CBC with Differential     Status: Abnormal   Collection Time: 01/08/20  3:46 PM  Result Value Ref Range   WBC 7.9 4.0 - 10.5 K/uL   RBC 4.24 4.22 - 5.81 MIL/uL   Hemoglobin 13.0 13.0 - 17.0 g/dL   HCT 40.0 39.0 - 52.0 %   MCV 94.3 80.0 - 100.0 fL   MCH 30.7 26.0 - 34.0 pg   MCHC 32.5 30.0 - 36.0 g/dL   RDW 13.2 11.5 - 15.5 %   Platelets 158 150 - 400 K/uL   nRBC 0.0 0.0 - 0.2 %   Neutrophils Relative % 79 %   Neutro Abs 6.2 1.7 - 7.7 K/uL   Lymphocytes Relative 7 %   Lymphs Abs 0.6 (L) 0.7 - 4.0 K/uL   Monocytes Relative 13 %   Monocytes Absolute 1.0 0.1 - 1.0 K/uL   Eosinophils Relative 0 %   Eosinophils Absolute 0.0 0.0 - 0.5 K/uL   Basophils Relative 0 %   Basophils Absolute 0.0 0.0 - 0.1 K/uL   Immature Granulocytes 1 %   Abs Immature Granulocytes 0.08 (H) 0.00 - 0.07 K/uL    Comment: Performed at [facility], [place] [address] [place], [place] [address]  Comprehensive  metabolic panel     Status: Abnormal   Collection Time: 01/08/20  3:46 PM  Result Value Ref Range   Sodium 131 (L) 135 - 145 mmol/L   Potassium 4.7 3.5 - 5.1 mmol/L   Chloride 101 98 - 111 mmol/L   CO2 19 (L) 22 - 32 mmol/L   Glucose, Bld 125 (H) 70 - 99 mg/dL    Comment: Glucose reference range applies only to samples taken after fasting for at least 8 hours.   BUN 73 (H) 8 - 23 mg/dL   Creatinine, Ser 10.68 (H) 0.61 - 1.24 mg/dL   Calcium 8.1 (  L) 8.9 - 10.3 mg/dL   Total Protein 6.6 6.5 - 8.1 g/dL   Albumin 3.4 (L) 3.5 - 5.0 g/dL   AST 14 (L) 15 - 41 U/L   ALT 14 0 - 44 U/L   Alkaline Phosphatase 69 38 - 126 U/L   Total Bilirubin 0.6 0.3 - 1.2 mg/dL   GFR calc non Af Amer 4 (L) >60 mL/min   GFR calc Af Amer 5 (L) >60 mL/min   Anion gap 11 5 - 15    Comment: Performed at [facility], [place] [address] [place], [place] [address]   No results found for this or any previous visit (from the past 240 hour(s)). Creatinine: Recent Labs    01/08/20 1546  CREATININE 10.68*    Xrays: See report/chart See above  Impression/Assessment:  Picture drawn; bilateral stones and poorly fx left kidney  Plan:  Cysto retro bilateral stents Pros cons risks perc sequelae injury etc Medical admission for renal falure  Yahshua Thibault A Malyssa Maris 01/08/2020, 6:24 PM

## 2020-01-08 NOTE — H&P (Signed)
History and Physical    Keith [name]. KDT:[id number] DOB: [birth year] DOA: 01/08/2020  PCP: Leanna Battles, MD  Patient coming from: Home  I have personally briefly reviewed patient's old medical records in [place]  Chief Complaint: Lower back pain  HPI: Keith [name]. is a 74 y.o. male with medical history significant for type 2 diabetes, CKD stage III, hypertension, hyperlipidemia, BPH s/p TURP, Depression/anxiety, and OSA on BiPAP who presents to the ED for evaluation of lower back pain with nausea and vomiting.  Patient reports new onset of frequent nausea and vomiting beginning 3 days ago.  He has been having progressive bilateral lower back pain since that time.  He also reports decreased urine output than what has been expected and says he had small amount of urine output last night but none since.  He denies any dysuria or pain with urination.  Today his back pain became so severe he presented to the ED for further evaluation.  He has had associated chills but denies any subjective fevers or diaphoresis.  He denies any chest pain.  He feels he has been having shallow breathing.  He denies any cough.  He denies any abdominal pain.  ED Course:  In the ED, initial vitals showed BP 125/69, pulse 60, RR 18, temp 98.2 Fahrenheit, SPO2 95% on room air.  Labs notable for creatinine 10.68 (1.97 previously on 05/23/2018), BUN 73, sodium 131, potassium 4.7, bicarb 19, WBC 7.9, hemoglobin 13.0, platelets 158,000.  SARS-CoV-2 PCR respiratory panel was collected and pending.  CT renal stone study showed 5 mm right ureteral stone with hydronephrosis and multiple tiny left ureteral calculi.  Diverticulosis without diverticulitis also seen.  Urology were consulted and recommended medical admission with plan for cystoscopy with retrograde pyelogram/ureteral stent placement bilaterally tonight.  The hospitalist service was consulted to admit for further management.  Review of Systems:  All systems reviewed and are negative except as documented in history of present illness above.   Past Medical History:  Diagnosis Date  . Anginal pain ([place])   . Anxiety    takes Celexa  . Arthritis    bil knees  . Benign prostatic hypertrophy    takes Flomax daily  . Bladder stones 06/25/2013   1.9cm bladder stone.  Marland Kitchen BPH with urinary obstruction 06/25/2013   127cc prostate with large middle lobe and bladder stones.   . Complication of anesthesia    hard to wake up  . Depression   . Diabetes mellitus without complication ([place])   . ED (erectile dysfunction)   . Hyperlipidemia   . Hypertension   . PONV (postoperative nausea and vomiting)   . Sleep apnea    uses bipap machine  . Spinal headache    reports last spinal headache was yesterday 05-16-18, lasted for 2 hours a, relief with tylenol , denies vision change with yesterdays occurrence but wife endorses vision changes with past spinal headaches    . Type 2 DM with CKD stage 3 and hypertension ([place])     Past Surgical History:  Procedure Laterality Date  . CARDIAC CATHETERIZATION  06-15-14  . CATARACT EXTRACTION W/ INTRAOCULAR LENS  IMPLANT, BILATERAL  03/13/2003  . COLONOSCOPY    . CYSTOSCOPY WITH LITHOLAPAXY N/A 06/25/2013   Procedure: CYSTOSCOPY WITH LITHOLAPAXY;  Surgeon: [name], MD;  Location: [facility];  Service: Urology;  Laterality: N/A;  . KNEE ARTHROSCOPY  09/07/2010   left knee  . KNEE ARTHROSCOPY  05/27/2000   left  knee  . LEFT HEART CATHETERIZATION WITH CORONARY ANGIOGRAM N/A 06/17/2014   Procedure: LEFT HEART CATHETERIZATION WITH CORONARY ANGIOGRAM;  Surgeon: Laverda Page, MD;  Location: [facility] CATH LAB;  Service: Cardiovascular;  Laterality: N/A;  . RETINAL DETACHMENT REPAIR W/ SCLERAL BUCKLE LE  10/14/2002   Dr Zadie Rhine  . TONSILLECTOMY     age 2  . TOTAL KNEE ARTHROPLASTY  08/01/2011   Procedure: TOTAL KNEE ARTHROPLASTY;  Surgeon: Lorn Junes, MD;  Location: [place];  Service: Orthopedics;  Laterality:  Right;  TOTAL KNEE ARTHROPLASTY  RIGHT SIDE  . TOTAL KNEE ARTHROPLASTY Left 05/22/2018   Procedure: LEFT TOTAL KNEE ARTHROPLASTY;  Surgeon: Paralee Cancel, MD;  Location: [facility];  Service: Orthopedics;  Laterality: Left;  . TRANSURETHRAL RESECTION OF PROSTATE N/A 06/25/2013   Procedure: TRANSURETHRAL RESECTION OF THE PROSTATE WITH GYRUS INSTRUMENTS;  Surgeon: [name], MD;  Location: [facility];  Service: Urology;  Laterality: N/A;  . VASECTOMY  1982    Social History:  reports that he has never smoked. He has never used smokeless tobacco. He reports current alcohol use. He reports that he does not use drugs.  Allergies  Allergen Reactions  . Penicillins Anaphylaxis    eyes swell shut  Has patient had a PCN reaction causing immediate rash, facial/tongue/throat swelling, SOB or lightheadedness with hypotension: yes Has patient had a PCN reaction causing severe rash involving mucus membranes or skin necrosis: no Has patient had a PCN reaction that required hospitalization no Has patient had a PCN reaction occurring within the last 10 years: no If all of the above answers are "NO", then may proceed with Cephalosporin use.  Judeth Cornfield Reductase Inhibitors     Family History  Problem Relation Age of Onset  . Hypertension Mother   . Heart failure Mother   . Heart failure Father   . Arthritis Other   . Cancer Other        breast, skin  . Coronary artery disease Other   . Heart disease Other   . Anesthesia problems Neg Hx   . Hypotension Neg Hx   . Malignant hyperthermia Neg Hx   . Pseudochol deficiency Neg Hx      Prior to Admission medications   Medication Sig Start Date End Date Taking? Authorizing Provider  Armodafinil 250 MG tablet Take 250 mg by mouth at bedtime. 01/08/20  Yes [provider]  aspirin EC 81 MG tablet Take 81 mg by mouth daily.   Yes [provider]  atorvastatin (LIPITOR) 20 MG tablet Take 20 mg by mouth daily.   Yes [provider]    carvedilol (COREG) 12.5 MG tablet Take 1 tablet (12.5 mg total) by mouth 2 (two) times daily. 12/23/19 03/22/20 Yes [name], MD  Cholecalciferol (VITAMIN D) 2000 UNITS tablet Take 2,000 Units by mouth daily.   Yes [provider]  escitalopram (LEXAPRO) 20 MG tablet Take 20 mg by mouth daily.  11/13/17  Yes [provider]  finasteride (PROSCAR) 5 MG tablet Take 5 mg by mouth daily. 01/06/20  Yes [provider]  gabapentin (NEURONTIN) 100 MG capsule Take 1 capsule by mouth daily as needed (nerve pain).  09/20/19  Yes [provider]  magnesium oxide (MAG-OX) 400 MG tablet Take 400 mg by mouth daily.   Yes [provider]  nitroGLYCERIN (NITROSTAT) 0.4 MG SL tablet Place 1 tablet (0.4 mg total) under the tongue every 5 (five) minutes as needed for chest pain. 10/30/19  Yes  Miquel Dunn, NP  Omega-3 Fatty Acids (FISH OIL) 1000 MG CAPS Take 1,000 mg by mouth daily.   Yes [provider]  oxybutynin (DITROPAN-XL) 10 MG 24 hr tablet Take 10 mg by mouth at bedtime.  04/05/19  Yes [provider]  Semaglutide (OZEMPIC, 0.25 OR 0.5 MG/DOSE, [place]) Inject 0.5 mg into the skin once a week. Friday.   Yes [provider]  methocarbamol (ROBAXIN) 500 MG tablet Take 1 tablet (500 mg total) by mouth every 6 (six) hours as needed for muscle spasms. Patient not taking: Reported on 01/08/2020 05/22/18   Danae Orleans, PA-C    Physical Exam: Vitals:   01/08/20 1059 01/08/20 1610 01/08/20 1700 01/08/20 1900  BP: 125/69 (!) 144/66 (!) 151/73 (!) 157/90  Pulse: 60 (!) 58 (!) 57 75  Resp: 18 17 15  (!) 27  Temp: 98.2 F (36.8 C)     TempSrc: Oral     SpO2: 95% 99% 97% 98%    Constitutional: Obese man resting supine in bed, NAD, calm, appears somewhat comfortable Eyes: PERRL, lids and conjunctivae normal ENMT: Mucous membranes are moist. Posterior pharynx clear of any exudate or lesions. Neck: normal, supple, no masses. Respiratory:  clear to auscultation bilaterally, no wheezing, no crackles.  Increased respiratory effort. No accessory muscle use.  Cardiovascular: Regular rate and rhythm, no murmurs / rubs / gallops. No extremity edema. Abdomen: no tenderness, no masses palpated. No hepatosplenomegaly. Bowel sounds positive.  Musculoskeletal: no clubbing / cyanosis. No joint deformity upper and lower extremities. Good ROM, no contractures. Normal muscle tone.  Right knee brace in place. Skin: no rashes, lesions, ulcers. No induration Neurologic: CN 2-12 grossly intact. Sensation intact, Strength 5/5 in all 4.  Psychiatric: Normal judgment and insight. Alert and oriented x 3. Normal mood.   Labs on Admission: I have personally reviewed following labs and imaging studies  CBC: Recent Labs  Lab 01/08/20 1546  WBC 7.9  NEUTROABS 6.2  HGB 13.0  HCT 40.0  MCV 94.3  PLT 269   Basic Metabolic Panel: Recent Labs  Lab 01/08/20 1546  NA 131*  K 4.7  CL 101  CO2 19*  GLUCOSE 125*  BUN 73*  CREATININE 10.68*  CALCIUM 8.1*   GFR: CrCl cannot be calculated (Unknown ideal weight.). Liver Function Tests: Recent Labs  Lab 01/08/20 1546  AST 14*  ALT 14  ALKPHOS 69  BILITOT 0.6  PROT 6.6  ALBUMIN 3.4*   No results for input(s): LIPASE, AMYLASE in the last 168 hours. No results for input(s): AMMONIA in the last 168 hours. Coagulation Profile: No results for input(s): INR, PROTIME in the last 168 hours. Cardiac Enzymes: No results for input(s): CKTOTAL, CKMB, CKMBINDEX, TROPONINI in the last 168 hours. BNP (last 3 results) No results for input(s): PROBNP in the last 8760 hours. HbA1C: No results for input(s): HGBA1C in the last 72 hours. CBG: No results for input(s): GLUCAP in the last 168 hours. Lipid Profile: No results for input(s): CHOL, HDL, LDLCALC, TRIG, CHOLHDL, LDLDIRECT in the last 72 hours. Thyroid Function Tests: No results for input(s): TSH, T4TOTAL, FREET4, T3FREE, THYROIDAB in the last 72  hours. Anemia Panel: No results for input(s): VITAMINB12, FOLATE, FERRITIN, TIBC, IRON, RETICCTPCT in the last 72 hours. Urine analysis:    Component Value Date/Time   COLORURINE YELLOW 08/05/2015 1855   APPEARANCEUR CLEAR 08/05/2015 1855   LABSPEC 1.026 08/05/2015 1855   PHURINE 5.0 08/05/2015 1855   GLUCOSEU >1000 (A) 08/05/2015 1855  HGBUR MODERATE (A) 08/05/2015 1855   HGBUR negative 08/19/2010 1200   BILIRUBINUR NEGATIVE 08/05/2015 1855   BILIRUBINUR 2+ 04/19/2013 1630   KETONESUR NEGATIVE 08/05/2015 1855   PROTEINUR NEGATIVE 08/05/2015 1855   UROBILINOGEN 1.0 04/19/2013 1630   UROBILINOGEN 0.2 07/26/2011 0934   NITRITE NEGATIVE 08/05/2015 1855   LEUKOCYTESUR NEGATIVE 08/05/2015 1855    Radiological Exams on Admission: CT Renal Stone Study  Result Date: 01/08/2020 CLINICAL DATA:  Predominately left flank pain EXAM: CT ABDOMEN AND PELVIS WITHOUT CONTRAST TECHNIQUE: Multidetector CT imaging of the abdomen and pelvis was performed following the standard protocol without IV contrast. COMPARISON:  09/15/2016 FINDINGS: Lower chest: Mild bibasilar atelectatic changes are noted. Hepatobiliary: No focal liver abnormality is seen. No gallstones, gallbladder wall thickening, or biliary dilatation. Pancreas: Unremarkable. No pancreatic ductal dilatation or surrounding inflammatory changes. Spleen: Normal in size without focal abnormality. Adrenals/Urinary Tract: Adrenal glands are well visualized and within normal limits. Right kidney demonstrates very mild hydronephrosis and hydroureter secondary to a proximal 5 mm right ureteral stone. The more distal right ureter is within normal limits. The bladder is decompressed. Left kidney is somewhat atrophic without significant hydronephrosis. There are however 2 small calculi identified within the distal left ureter these measure 1-2 mm in dimension. A slightly more proximal stone is noted within the left ureter best seen on image number 95 of series  4. Stomach/Bowel: Mild diverticular changes noted without evidence of diverticulitis. No obstructive or inflammatory changes of the colon are seen. The appendix is well visualized and within normal limits. No obstructive changes are noted in the small bowel. The stomach is within normal limits. Vascular/Lymphatic: Aortic atherosclerosis. No enlarged abdominal or pelvic lymph nodes. Reproductive: Prostate is unremarkable. Other: Fat containing anterior abdominal wall hernia is noted just above the umbilicus. The hernia neck measures approximately 3.1 cm. This is stable from the prior exam. Additionally small fat containing inguinal hernias are noted bilaterally. Some fluid is noted within the right inguinal hernia. The fluid is new from the prior exam although the inguinal hernias are stable. Musculoskeletal: No acute or significant osseous findings. IMPRESSION: 5 mm right ureteral stone with hydronephrosis. Multiple tiny left ureteral calculi without significant obstructive change. Diverticulosis without diverticulitis. Stable fat containing anterior abdominal wall and inguinal hernias. Electronically Signed   By: Inez Catalina M.D.   On: 01/08/2020 16:25    EKG: Independently reviewed.  Normal sinus rhythm, first-degree AV block, LBBB.  Not significantly changed when compared to prior.  Assessment/Plan Principal Problem:   Acute renal failure superimposed on stage 3b chronic kidney disease (HCC) Active Problems:   Hypertension associated with diabetes ([place])   Sleep apnea treated with nocturnal BiPAP   Type 2 diabetes mellitus with stage 3 chronic kidney disease (HCC)   Hyperlipidemia associated with type 2 diabetes mellitus ([place])   Renal calculus, bilateral   Hydronephrosis of right kidney  Keith [name]. is a 74 y.o. male with medical history significant for type 2 diabetes, CKD stage III, hypertension, hyperlipidemia, BPH s/p TURP, Depression/anxiety, and OSA on BiPAP who is admitted with AKI on  CKD stage III due to obstructive ureteral stone.  AKI on CKD stage III secondary to obstructive urolithiasis with right-sided hydronephrosis: -Urology following and plan for cystoscopy with bilateral ureteral stent placement tonight -Keep n.p.o. -Continue IVFs, antiemetics prn, and as needed pain control -Monitor strict I/O's and daily renal function -Follow urinalysis  Type 2 diabetes: Hold home Ozempic, place on sensitive SSI q4h with hypoglycemia protocol  while NPO.  Hypertension: Home carvedilol on hold while n.p.o. for urological intervention.  Hyperlipidemia: Resume atorvastatin when taking oral medications.  Depression/anxiety: Resume Lexapro when taking oral medications.  OSA: Continue BiPAP nightly.  DVT prophylaxis: SCDs Code Status: DNR, confirmed with the patient Family Communication: Discussed with his spouse Keith [name] by phone Disposition Plan: From home, likely discharge to home pending urological intervention and improvement in renal function Consults called: Urology Admission status:  Status is: Inpatient  Remains inpatient appropriate because:Inpatient level of care appropriate due to severity of illness   Dispo: The patient is from: Home              Anticipated d/c is to: Home              Anticipated d/c date is: 2 days              Patient currently is not medically stable to d/c.   Zada Finders MD [facility]  If 7PM-7AM, please contact night-coverage [email or web address]  01/08/2020, 7:26 PM

## 2020-01-08 NOTE — Anesthesia Procedure Notes (Signed)
Procedure Name: LMA Insertion Date/Time: 01/08/2020 8:41 PM Performed by: Montel Clock, CRNA Pre-anesthesia Checklist: Patient identified, Emergency Drugs available, Suction available, Patient being monitored and Timeout performed Patient Re-evaluated:Patient Re-evaluated prior to induction Oxygen Delivery Method: Circle system utilized Preoxygenation: Pre-oxygenation with 100% oxygen Induction Type: IV induction LMA: LMA inserted LMA Size: 5.0 Number of attempts: 1 Dental Injury: Teeth and Oropharynx as per pre-operative assessment

## 2020-01-08 NOTE — ED Notes (Signed)
Rn prepped pt for OR. Attempted to call report to 4E charge, was unable to get through at this time. Made them aware that pt was going to OR and will receive pt after procedure.

## 2020-01-08 NOTE — ED Triage Notes (Addendum)
Per EMS-states lower back pain since Sunday-states he has a kidney stone-states left arm pain with kidney stone as well-painful and difficulty urinating-100 mcg of Fentanyl given in route

## 2020-01-08 NOTE — Anesthesia Postprocedure Evaluation (Signed)
Anesthesia Post Note  Patient: Keith [name].  Procedure(s) Performed: CYSTOSCOPY WITH bilateral  RETROGRADE PYELOGRAM/ bilateral URETERAL STENT PLACEMENT (Bilateral Ureter)     Patient location during evaluation: PACU Anesthesia Type: General Level of consciousness: awake and alert Pain management: pain level controlled Vital Signs Assessment: post-procedure vital signs reviewed and stable Respiratory status: spontaneous breathing, nonlabored ventilation, respiratory function stable and patient connected to nasal cannula oxygen Cardiovascular status: blood pressure returned to baseline and stable Postop Assessment: no apparent nausea or vomiting Anesthetic complications: no    Last Vitals:  Vitals:   01/08/20 2000 01/08/20 2118  BP: (!) 186/86   Pulse: 87   Resp: (!) 24   Temp:  (!) (P) 38.1 C  SpO2: 98%     Last Pain:  Vitals:   01/08/20 1059  TempSrc: Oral  PainSc: 2                  Inessa Wardrop S

## 2020-01-09 DIAGNOSIS — Z87892 Personal history of anaphylaxis: Secondary | ICD-10-CM | POA: Diagnosis not present

## 2020-01-09 DIAGNOSIS — G4733 Obstructive sleep apnea (adult) (pediatric): Secondary | ICD-10-CM | POA: Diagnosis present

## 2020-01-09 DIAGNOSIS — Z66 Do not resuscitate: Secondary | ICD-10-CM | POA: Diagnosis present

## 2020-01-09 DIAGNOSIS — Z20822 Contact with and (suspected) exposure to covid-19: Secondary | ICD-10-CM | POA: Diagnosis present

## 2020-01-09 DIAGNOSIS — N1832 Chronic kidney disease, stage 3b: Secondary | ICD-10-CM | POA: Diagnosis not present

## 2020-01-09 DIAGNOSIS — E1169 Type 2 diabetes mellitus with other specified complication: Secondary | ICD-10-CM | POA: Diagnosis not present

## 2020-01-09 DIAGNOSIS — N1330 Unspecified hydronephrosis: Secondary | ICD-10-CM

## 2020-01-09 DIAGNOSIS — N132 Hydronephrosis with renal and ureteral calculous obstruction: Secondary | ICD-10-CM | POA: Diagnosis present

## 2020-01-09 DIAGNOSIS — I10 Essential (primary) hypertension: Secondary | ICD-10-CM

## 2020-01-09 DIAGNOSIS — F419 Anxiety disorder, unspecified: Secondary | ICD-10-CM | POA: Diagnosis present

## 2020-01-09 DIAGNOSIS — N401 Enlarged prostate with lower urinary tract symptoms: Secondary | ICD-10-CM | POA: Diagnosis present

## 2020-01-09 DIAGNOSIS — N210 Calculus in bladder: Secondary | ICD-10-CM | POA: Diagnosis present

## 2020-01-09 DIAGNOSIS — Z888 Allergy status to other drugs, medicaments and biological substances status: Secondary | ICD-10-CM | POA: Diagnosis not present

## 2020-01-09 DIAGNOSIS — E1159 Type 2 diabetes mellitus with other circulatory complications: Secondary | ICD-10-CM

## 2020-01-09 DIAGNOSIS — I152 Hypertension secondary to endocrine disorders: Secondary | ICD-10-CM | POA: Diagnosis present

## 2020-01-09 DIAGNOSIS — E1122 Type 2 diabetes mellitus with diabetic chronic kidney disease: Secondary | ICD-10-CM | POA: Diagnosis present

## 2020-01-09 DIAGNOSIS — Z8249 Family history of ischemic heart disease and other diseases of the circulatory system: Secondary | ICD-10-CM | POA: Diagnosis not present

## 2020-01-09 DIAGNOSIS — E785 Hyperlipidemia, unspecified: Secondary | ICD-10-CM | POA: Diagnosis not present

## 2020-01-09 DIAGNOSIS — Z8261 Family history of arthritis: Secondary | ICD-10-CM | POA: Diagnosis not present

## 2020-01-09 DIAGNOSIS — N179 Acute kidney failure, unspecified: Secondary | ICD-10-CM | POA: Diagnosis not present

## 2020-01-09 DIAGNOSIS — N201 Calculus of ureter: Secondary | ICD-10-CM | POA: Diagnosis not present

## 2020-01-09 DIAGNOSIS — Z880 Allergy status to penicillin: Secondary | ICD-10-CM | POA: Diagnosis not present

## 2020-01-09 DIAGNOSIS — F329 Major depressive disorder, single episode, unspecified: Secondary | ICD-10-CM | POA: Diagnosis present

## 2020-01-09 DIAGNOSIS — N200 Calculus of kidney: Secondary | ICD-10-CM

## 2020-01-09 LAB — CBC
HCT: 39.7 % (ref 39.0–52.0)
Hemoglobin: 13.2 g/dL (ref 13.0–17.0)
MCH: 30.8 pg (ref 26.0–34.0)
MCHC: 33.2 g/dL (ref 30.0–36.0)
MCV: 92.8 fL (ref 80.0–100.0)
Platelets: 156 10*3/uL (ref 150–400)
RBC: 4.28 MIL/uL (ref 4.22–5.81)
RDW: 13.2 % (ref 11.5–15.5)
WBC: 11.4 10*3/uL — ABNORMAL HIGH (ref 4.0–10.5)
nRBC: 0 % (ref 0.0–0.2)

## 2020-01-09 LAB — BASIC METABOLIC PANEL
Anion gap: 14 (ref 5–15)
BUN: 67 mg/dL — ABNORMAL HIGH (ref 8–23)
CO2: 15 mmol/L — ABNORMAL LOW (ref 22–32)
Calcium: 8.3 mg/dL — ABNORMAL LOW (ref 8.9–10.3)
Chloride: 104 mmol/L (ref 98–111)
Creatinine, Ser: 7.02 mg/dL — ABNORMAL HIGH (ref 0.61–1.24)
GFR calc Af Amer: 8 mL/min — ABNORMAL LOW (ref >60)
GFR calc non Af Amer: 7 mL/min — ABNORMAL LOW (ref >60)
Glucose, Bld: 126 mg/dL — ABNORMAL HIGH (ref 70–99)
Potassium: 4.3 mmol/L (ref 3.5–5.1)
Sodium: 133 mmol/L — ABNORMAL LOW (ref 135–145)

## 2020-01-09 LAB — GLUCOSE, CAPILLARY
Glucose-Capillary: 104 mg/dL — ABNORMAL HIGH (ref 70–99)
Glucose-Capillary: 106 mg/dL — ABNORMAL HIGH (ref 70–99)
Glucose-Capillary: 107 mg/dL — ABNORMAL HIGH (ref 70–99)
Glucose-Capillary: 114 mg/dL — ABNORMAL HIGH (ref 70–99)
Glucose-Capillary: 119 mg/dL — ABNORMAL HIGH (ref 70–99)
Glucose-Capillary: 128 mg/dL — ABNORMAL HIGH (ref 70–99)
Glucose-Capillary: 133 mg/dL — ABNORMAL HIGH (ref 70–99)
Glucose-Capillary: 168 mg/dL — ABNORMAL HIGH (ref 70–99)

## 2020-01-09 MED ORDER — CHLORHEXIDINE GLUCONATE CLOTH 2 % EX PADS
6.0000 | MEDICATED_PAD | Freq: Every day | CUTANEOUS | Status: DC
  Administered 2020-01-09 – 2020-01-10 (×2): 6 via TOPICAL

## 2020-01-09 MED ORDER — INSULIN ASPART 100 UNIT/ML ~~LOC~~ SOLN
0.0000 [IU] | Freq: Three times a day (TID) | SUBCUTANEOUS | Status: DC
  Administered 2020-01-10: 1 [IU] via SUBCUTANEOUS
  Administered 2020-01-10: 2 [IU] via SUBCUTANEOUS

## 2020-01-09 NOTE — Progress Notes (Signed)
Patient looks a lot better Did bump a temp thru the night Cipro had been given preop  Cr reduced to 7 Will follow Not feeling stents

## 2020-01-09 NOTE — Progress Notes (Signed)
Patient ID: [name], male   DOB: [birth year], 74 y.o.   MRN: [id number]  I stopped in to see Mr. Riegler.  He is doing well after stent placement with good UOP and a 3 point decline in his Cr.    He is going to need bilateral ureteroscopy for the stones in 2-3 weeks.  I discussed that with him and reviewed the risks.  I will work on getting that scheduled.

## 2020-01-09 NOTE — Progress Notes (Signed)
PROGRESS NOTE    Keith [name].  NLZ:[id number] DOB: 1946-04-22 DOA: 01/08/2020 PCP: Keith Battles, MD   Brief Narrative: Keith Maners. is a 74 y.o. male with medical history significant for type 2 diabetes, CKD stage III, hypertension, hyperlipidemia, BPH s/p TURP, Depression/anxiety, and OSA on BiPAP. Patient presented secondary to lower back pain and was found to have an AKI in the setting of obstructive uropathy from right sided ureteral stone and hydronephrosis. Patient underwent cystoscopy and stent placement on 4/28.   Assessment & Plan:   Principal Problem:   Acute renal failure superimposed on stage 3b chronic kidney disease ([place]) Active Problems:   Hypertension associated with diabetes ([place])   Sleep apnea treated with nocturnal BiPAP   Type 2 diabetes mellitus with stage 3 chronic kidney disease ([place])   Hyperlipidemia associated with type 2 diabetes mellitus ([place])   Renal calculus, bilateral   Hydronephrosis of right kidney   Renal failure   AKI on CKD stage IIIb Obstructive urolithiasis Hydronephrosis of right kidney Baseline creatinine of difficult to ascertain but about 2 from 2019. Creatinine of 10.68 on admission. Patient underwent cystoscopy with bilateral ureteral stent placement on 4/28. Creatinine starting to trend down. Creatinine of 7.02 today. -Daily BMP -Watch urine output  Fever Associated leukocytosis. In setting of urolithiasis and recent stent placement. Per urology, no signs of an infected stone. -Urine culture, blood cultures  Diabetes mellitus, type 2 On Ozempic as an outpatient. -SSI qAC and HS  Essential hypertension -Continue Coreg  Hyperlipidemia -Continue Lipitor  Depression Anxiety -Continue Lexapro  Obstructive sleep apnea -Continue BiPAP qhs  BPH -Continue finasteride   DVT prophylaxis: SCDs Code Status:   Code Status: DNR Family Communication: None at bedside Disposition Plan: Discharge pending improvement of  creatinine and follow-up of blood/urine cultures   Consultants:   Urology  Procedures:   Cystoscopy bilateral retrograde ureterogram's and bilateral ureteral stent insertion (01/08/2020)  Antimicrobials:  None    Subjective: No concerns other than wanting the foley catheter out.  Objective: Vitals:   01/08/20 2319 01/09/20 0214 01/09/20 0535 01/09/20 0909  BP:  (!) 182/75 (!) 170/80 (!) 106/42  Pulse: 72 78 89 69  Resp: 18 (!) 21 (!) 22 18  Temp:  100.1 F (37.8 C) (!) 102.7 F (39.3 C) 98.4 F (36.9 C)  TempSrc:   Oral Oral  SpO2: 98% 100% 94% 95%  Height:        Intake/Output Summary (Last 24 hours) at 01/09/2020 1213 Last data filed at 01/09/2020 0500 Gross per 24 hour  Intake 350 ml  Output 2350 ml  Net -2000 ml   There were no vitals filed for this visit.  Examination:  General exam: Appears calm and comfortable Respiratory system: Clear to auscultation. Respiratory effort normal. Cardiovascular system: S1 & S2 heard, RRR. No murmurs, rubs, gallops or clicks. Gastrointestinal system: Abdomen is nondistended, soft and nontender. No organomegaly or masses felt. Normal bowel sounds heard. Central nervous system: Alert and oriented. No focal neurological deficits. Extremities: No edema. No calf tenderness Skin: No cyanosis. No rashes Psychiatry: Judgement and insight appear normal. Mood & affect appropriate.     Data Reviewed: I have personally reviewed following labs and imaging studies  CBC: Recent Labs  Lab 01/08/20 1546 01/09/20 0510  WBC 7.9 11.4*  NEUTROABS 6.2  --   HGB 13.0 13.2  HCT 40.0 39.7  MCV 94.3 92.8  PLT 158 902   Basic Metabolic Panel: Recent Labs  Lab 01/08/20  1546 01/09/20 0510  NA 131* 133*  K 4.7 4.3  CL 101 104  CO2 19* 15*  GLUCOSE 125* 126*  BUN 73* 67*  CREATININE 10.68* 7.02*  CALCIUM 8.1* 8.3*   GFR: CrCl cannot be calculated (Unknown ideal weight.). Liver Function Tests: Recent Labs  Lab 01/08/20 1546    AST 14*  ALT 14  ALKPHOS 69  BILITOT 0.6  PROT 6.6  ALBUMIN 3.4*   No results for input(s): LIPASE, AMYLASE in the last 168 hours. No results for input(s): AMMONIA in the last 168 hours. Coagulation Profile: No results for input(s): INR, PROTIME in the last 168 hours. Cardiac Enzymes: No results for input(s): CKTOTAL, CKMB, CKMBINDEX, TROPONINI in the last 168 hours. BNP (last 3 results) No results for input(s): PROBNP in the last 8760 hours. HbA1C: Recent Labs    01/08/20 1546  HGBA1C 7.4*   CBG: Recent Labs  Lab 01/08/20 2024 01/09/20 0004 01/09/20 0434 01/09/20 0641 01/09/20 0817  GLUCAP 107* 114* 133* 128* 119*   Lipid Profile: No results for input(s): CHOL, HDL, LDLCALC, TRIG, CHOLHDL, LDLDIRECT in the last 72 hours. Thyroid Function Tests: No results for input(s): TSH, T4TOTAL, FREET4, T3FREE, THYROIDAB in the last 72 hours. Anemia Panel: No results for input(s): VITAMINB12, FOLATE, FERRITIN, TIBC, IRON, RETICCTPCT in the last 72 hours. Sepsis Labs: No results for input(s): PROCALCITON, LATICACIDVEN in the last 168 hours.  Recent Results (from the past 240 hour(s))  Respiratory Panel by RT PCR (Flu A&B, Covid) - Nasopharyngeal Swab     Status: None   Collection Time: 01/08/20  6:18 PM   Specimen: Nasopharyngeal Swab  Result Value Ref Range Status   SARS Coronavirus 2 by RT PCR NEGATIVE NEGATIVE Final    Comment: (NOTE) SARS-CoV-2 target nucleic acids are NOT DETECTED. The SARS-CoV-2 RNA is generally detectable in upper respiratoy specimens during the acute phase of infection. The lowest concentration of SARS-CoV-2 viral copies this assay can detect is 131 copies/mL. A negative result does not preclude SARS-Cov-2 infection and should not be used as the sole basis for treatment or other patient management decisions. A negative result may occur with  improper specimen collection/handling, submission of specimen other than nasopharyngeal swab, presence of  viral mutation(s) within the areas targeted by this assay, and inadequate number of viral copies (<131 copies/mL). A negative result must be combined with clinical observations, patient history, and epidemiological information. The expected result is Negative. Fact Sheet for Patients:  PinkCheek.be Fact Sheet for Healthcare Providers:  GravelBags.it This test is not yet ap proved or cleared by the [place] FDA and  has been authorized for detection and/or diagnosis of SARS-CoV-2 by FDA under an Emergency Use Authorization (EUA). This EUA will remain  in effect (meaning this test can be used) for the duration of the COVID-19 declaration under Section 564(b)(1) of the Act, 21 U.S.C. section 360bbb-3(b)(1), unless the authorization is terminated or revoked sooner.    Influenza A by PCR NEGATIVE NEGATIVE Final   Influenza B by PCR NEGATIVE NEGATIVE Final    Comment: (NOTE) The Xpert Xpress SARS-CoV-2/FLU/RSV assay is intended as an aid in  the diagnosis of influenza from Nasopharyngeal swab specimens and  should not be used as a sole basis for treatment. Nasal washings and  aspirates are unacceptable for Xpert Xpress SARS-CoV-2/FLU/RSV  testing. Fact Sheet for Patients: PinkCheek.be Fact Sheet for Healthcare Providers: GravelBags.it This test is not yet approved or cleared by the [place] and  has been authorized  for detection and/or diagnosis of SARS-CoV-2 by  FDA under an Emergency Use Authorization (EUA). This EUA will remain  in effect (meaning this test can be used) for the duration of the  Covid-19 declaration under Section 564(b)(1) of the Act, 21  U.S.C. section 360bbb-3(b)(1), unless the authorization is  terminated or revoked. Performed at [facility], [place] [address] [place], [place] [address]          Radiology  Studies: CT Renal Stone Study  Result Date: 01/08/2020 CLINICAL DATA:  Predominately left flank pain EXAM: CT ABDOMEN AND PELVIS WITHOUT CONTRAST TECHNIQUE: Multidetector CT imaging of the abdomen and pelvis was performed following the standard protocol without IV contrast. COMPARISON:  09/15/2016 FINDINGS: Lower chest: Mild bibasilar atelectatic changes are noted. Hepatobiliary: No focal liver abnormality is seen. No gallstones, gallbladder wall thickening, or biliary dilatation. Pancreas: Unremarkable. No pancreatic ductal dilatation or surrounding inflammatory changes. Spleen: Normal in size without focal abnormality. Adrenals/Urinary Tract: Adrenal glands are well visualized and within normal limits. Right kidney demonstrates very mild hydronephrosis and hydroureter secondary to a proximal 5 mm right ureteral stone. The more distal right ureter is within normal limits. The bladder is decompressed. Left kidney is somewhat atrophic without significant hydronephrosis. There are however 2 small calculi identified within the distal left ureter these measure 1-2 mm in dimension. A slightly more proximal stone is noted within the left ureter best seen on image number 95 of series 4. Stomach/Bowel: Mild diverticular changes noted without evidence of diverticulitis. No obstructive or inflammatory changes of the colon are seen. The appendix is well visualized and within normal limits. No obstructive changes are noted in the small bowel. The stomach is within normal limits. Vascular/Lymphatic: Aortic atherosclerosis. No enlarged abdominal or pelvic lymph nodes. Reproductive: Prostate is unremarkable. Other: Fat containing anterior abdominal wall hernia is noted just above the umbilicus. The hernia neck measures approximately 3.1 cm. This is stable from the prior exam. Additionally small fat containing inguinal hernias are noted bilaterally. Some fluid is noted within the right inguinal hernia. The fluid is new from the  prior exam although the inguinal hernias are stable. Musculoskeletal: No acute or significant osseous findings. IMPRESSION: 5 mm right ureteral stone with hydronephrosis. Multiple tiny left ureteral calculi without significant obstructive change. Diverticulosis without diverticulitis. Stable fat containing anterior abdominal wall and inguinal hernias. Electronically Signed   By: Inez Catalina M.D.   On: 01/08/2020 16:25        Scheduled Meds:  aspirin EC  81 mg Oral Daily   atorvastatin  20 mg Oral Daily   carvedilol  12.5 mg Oral BID   Chlorhexidine Gluconate Cloth  6 each Topical Daily   cholecalciferol  2,000 Units Oral Daily   escitalopram  20 mg Oral Daily   finasteride  5 mg Oral Daily   insulin aspart  0-9 Units Subcutaneous Q4H   magnesium oxide  400 mg Oral Daily   modafinil  200 mg Oral QHS   oxybutynin  10 mg Oral QHS   Continuous Infusions:   LOS: 1 day     Cordelia Poche, MD [facility] 01/09/2020, 12:13 PM  If 7PM-7AM, please contact night-coverage [email or web address]

## 2020-01-10 DIAGNOSIS — N179 Acute kidney failure, unspecified: Secondary | ICD-10-CM | POA: Diagnosis not present

## 2020-01-10 DIAGNOSIS — E1159 Type 2 diabetes mellitus with other circulatory complications: Secondary | ICD-10-CM | POA: Diagnosis not present

## 2020-01-10 DIAGNOSIS — N1330 Unspecified hydronephrosis: Secondary | ICD-10-CM | POA: Diagnosis not present

## 2020-01-10 DIAGNOSIS — E1169 Type 2 diabetes mellitus with other specified complication: Secondary | ICD-10-CM | POA: Diagnosis not present

## 2020-01-10 LAB — BASIC METABOLIC PANEL
Anion gap: 9 (ref 5–15)
BUN: 58 mg/dL — ABNORMAL HIGH (ref 8–23)
CO2: 19 mmol/L — ABNORMAL LOW (ref 22–32)
Calcium: 8.4 mg/dL — ABNORMAL LOW (ref 8.9–10.3)
Chloride: 107 mmol/L (ref 98–111)
Creatinine, Ser: 4.1 mg/dL — ABNORMAL HIGH (ref 0.61–1.24)
GFR calc Af Amer: 16 mL/min — ABNORMAL LOW (ref >60)
GFR calc non Af Amer: 14 mL/min — ABNORMAL LOW (ref >60)
Glucose, Bld: 154 mg/dL — ABNORMAL HIGH (ref 70–99)
Potassium: 4.1 mmol/L (ref 3.5–5.1)
Sodium: 135 mmol/L (ref 135–145)

## 2020-01-10 LAB — CBC
HCT: 39.6 % (ref 39.0–52.0)
Hemoglobin: 13 g/dL (ref 13.0–17.0)
MCH: 30.7 pg (ref 26.0–34.0)
MCHC: 32.8 g/dL (ref 30.0–36.0)
MCV: 93.6 fL (ref 80.0–100.0)
Platelets: 149 10*3/uL — ABNORMAL LOW (ref 150–400)
RBC: 4.23 MIL/uL (ref 4.22–5.81)
RDW: 13.2 % (ref 11.5–15.5)
WBC: 6.5 10*3/uL (ref 4.0–10.5)
nRBC: 0 % (ref 0.0–0.2)

## 2020-01-10 LAB — URINE CULTURE: Culture: <10000 — AB

## 2020-01-10 LAB — GLUCOSE, CAPILLARY
Glucose-Capillary: 138 mg/dL — ABNORMAL HIGH (ref 70–99)
Glucose-Capillary: 178 mg/dL — ABNORMAL HIGH (ref 70–99)
Glucose-Capillary: 99 mg/dL (ref 70–99)
Glucose-Capillary: 145 mg/dL — ABNORMAL HIGH (ref 70–99)

## 2020-01-10 NOTE — Care Management Important Message (Signed)
Important Message  Patient Details IM Letter given to Evette Cristal SW Case Manager to present to the Patient Name: Keith [name]. MRN: [id number] Date of Birth: [birth year]   Medicare Important Message Given:  Yes     Kerin Salen 01/10/2020, 12:53 PM

## 2020-01-10 NOTE — Progress Notes (Signed)
PROGRESS NOTE    Keith [name].  NOB:[id number] DOB: February 16, 1946 DOA: 01/08/2020 PCP: Leanna Battles, MD   Brief Narrative: Keith [name]. is a 74 y.o. male with medical history significant for type 2 diabetes, CKD stage III, hypertension, hyperlipidemia, BPH s/p TURP, Depression/anxiety, and OSA on BiPAP. Patient presented secondary to lower back pain and was found to have an AKI in the setting of obstructive uropathy from right sided ureteral stone and hydronephrosis. Patient underwent cystoscopy and stent placement on 4/28.   Assessment & Plan:   Principal Problem:   Acute renal failure superimposed on stage 3b chronic kidney disease ([place]) Active Problems:   Hypertension associated with diabetes ([place])   Sleep apnea treated with nocturnal BiPAP   Type 2 diabetes mellitus with stage 3 chronic kidney disease ([place])   Hyperlipidemia associated with type 2 diabetes mellitus ([place])   Renal calculus, bilateral   Hydronephrosis of right kidney   Renal failure   AKI on CKD stage IIIb Obstructive urolithiasis Hydronephrosis of right kidney Baseline creatinine of difficult to ascertain but about 2 from 2019. Creatinine of 10.68 on admission. Patient underwent cystoscopy with bilateral ureteral stent placement on 4/28. Creatinine starting to trend down. Creatinine of 4.1 today. -Daily BMP -Watch urine output  Fever Associated leukocytosis. In setting of urolithiasis and recent stent placement. Per urology, no signs of an infected stone. Afebrile for the last 24 hours. -Urine culture, blood cultures pending  Diabetes mellitus, type 2 On Ozempic as an outpatient. -SSI qAC and HS  Essential hypertension -Continue Coreg  Hyperlipidemia -Continue Lipitor  Depression Anxiety -Continue Lexapro  Obstructive sleep apnea -Continue BiPAP qhs  BPH -Continue finasteride   DVT prophylaxis: SCDs Code Status:   Code Status: DNR Family Communication: None at bedside Disposition  Plan: Discharge pending improvement of creatinine and follow-up of blood/urine cultures   Consultants:   Urology  Procedures:   Cystoscopy bilateral retrograde ureterogram's and bilateral ureteral stent insertion (01/08/2020)  Antimicrobials:  None    Subjective: Wants catheter out. No other issues  Objective: Vitals:   01/09/20 2106 01/09/20 2231 01/10/20 0615 01/10/20 0637  BP: (!) 149/66 (!) 148/70 (!) 153/70   Pulse: 79 76 67   Resp: 20  20   Temp: 98.8 F (37.1 C)  98.7 F (37.1 C)   TempSrc:      SpO2: 90%  93%   Weight:    115.5 kg  Height:        Intake/Output Summary (Last 24 hours) at 01/10/2020 1254 Last data filed at 01/10/2020 0600 Gross per 24 hour  Intake 240 ml  Output 1625 ml  Net -1385 ml   Filed Weights   01/10/20 0637  Weight: 115.5 kg    Examination:  General exam: Appears calm and comfortable Respiratory system: Clear to auscultation. Respiratory effort normal. Cardiovascular system: S1 & S2 heard, RRR. No murmurs, rubs, gallops or clicks. Gastrointestinal system: Abdomen is distended, soft and nontender. No organomegaly or masses felt. Normal bowel sounds heard. Central nervous system: Alert and oriented. No focal neurological deficits. Extremities: No edema. No calf tenderness Skin: No cyanosis. No rashes Psychiatry: Judgement and insight appear normal. Mood & affect appropriate.     Data Reviewed: I have personally reviewed following labs and imaging studies  CBC: Recent Labs  Lab 01/08/20 1546 01/09/20 0510 01/10/20 0434  WBC 7.9 11.4* 6.5  NEUTROABS 6.2  --   --   HGB 13.0 13.2 13.0  HCT 40.0 39.7 39.6  MCV 94.3 92.8 93.6  PLT 158 156 570*   Basic Metabolic Panel: Recent Labs  Lab 01/08/20 1546 01/09/20 0510 01/10/20 0434  NA 131* 133* 135  K 4.7 4.3 4.1  CL 101 104 107  CO2 19* 15* 19*  GLUCOSE 125* 126* 154*  BUN 73* 67* 58*  CREATININE 10.68* 7.02* 4.10*  CALCIUM 8.1* 8.3* 8.4*   GFR: Estimated  Creatinine Clearance: 21.1 mL/min (A) (by C-G formula based on SCr of 4.1 mg/dL (H)). Liver Function Tests: Recent Labs  Lab 01/08/20 1546  AST 14*  ALT 14  ALKPHOS 69  BILITOT 0.6  PROT 6.6  ALBUMIN 3.4*   No results for input(s): LIPASE, AMYLASE in the last 168 hours. No results for input(s): AMMONIA in the last 168 hours. Coagulation Profile: No results for input(s): INR, PROTIME in the last 168 hours. Cardiac Enzymes: No results for input(s): CKTOTAL, CKMB, CKMBINDEX, TROPONINI in the last 168 hours. BNP (last 3 results) No results for input(s): PROBNP in the last 8760 hours. HbA1C: Recent Labs    01/08/20 1546  HGBA1C 7.4*   CBG: Recent Labs  Lab 01/09/20 1232 01/09/20 1638 01/09/20 2110 01/10/20 0726 01/10/20 1200  GLUCAP 104* 106* 168* 138* 178*   Lipid Profile: No results for input(s): CHOL, HDL, LDLCALC, TRIG, CHOLHDL, LDLDIRECT in the last 72 hours. Thyroid Function Tests: No results for input(s): TSH, T4TOTAL, FREET4, T3FREE, THYROIDAB in the last 72 hours. Anemia Panel: No results for input(s): VITAMINB12, FOLATE, FERRITIN, TIBC, IRON, RETICCTPCT in the last 72 hours. Sepsis Labs: No results for input(s): PROCALCITON, LATICACIDVEN in the last 168 hours.  Recent Results (from the past 240 hour(s))  Respiratory Panel by RT PCR (Flu A&B, Covid) - Nasopharyngeal Swab     Status: None   Collection Time: 01/08/20  6:18 PM   Specimen: Nasopharyngeal Swab  Result Value Ref Range Status   SARS Coronavirus 2 by RT PCR NEGATIVE NEGATIVE Final    Comment: (NOTE) SARS-CoV-2 target nucleic acids are NOT DETECTED. The SARS-CoV-2 RNA is generally detectable in upper respiratoy specimens during the acute phase of infection. The lowest concentration of SARS-CoV-2 viral copies this assay can detect is 131 copies/mL. A negative result does not preclude SARS-Cov-2 infection and should not be used as the sole basis for treatment or other patient management decisions. A  negative result may occur with  improper specimen collection/handling, submission of specimen other than nasopharyngeal swab, presence of viral mutation(s) within the areas targeted by this assay, and inadequate number of viral copies (<131 copies/mL). A negative result must be combined with clinical observations, patient history, and epidemiological information. The expected result is Negative. Fact Sheet for Patients:  PinkCheek.be Fact Sheet for Healthcare Providers:  GravelBags.it This test is not yet ap proved or cleared by the [place] FDA and  has been authorized for detection and/or diagnosis of SARS-CoV-2 by FDA under an Emergency Use Authorization (EUA). This EUA will remain  in effect (meaning this test can be used) for the duration of the COVID-19 declaration under Section 564(b)(1) of the Act, 21 U.S.C. section 360bbb-3(b)(1), unless the authorization is terminated or revoked sooner.    Influenza A by PCR NEGATIVE NEGATIVE Final   Influenza B by PCR NEGATIVE NEGATIVE Final    Comment: (NOTE) The Xpert Xpress SARS-CoV-2/FLU/RSV assay is intended as an aid in  the diagnosis of influenza from Nasopharyngeal swab specimens and  should not be used as a sole basis for treatment. Nasal washings and  aspirates  are unacceptable for Xpert Xpress SARS-CoV-2/FLU/RSV  testing. Fact Sheet for Patients: PinkCheek.be Fact Sheet for Healthcare Providers: GravelBags.it This test is not yet approved or cleared by the [place] FDA and  has been authorized for detection and/or diagnosis of SARS-CoV-2 by  FDA under an Emergency Use Authorization (EUA). This EUA will remain  in effect (meaning this test can be used) for the duration of the  Covid-19 declaration under Section 564(b)(1) of the Act, 21  U.S.C. section 360bbb-3(b)(1), unless the authorization is    terminated or revoked. Performed at [facility], [place] [address], [place], [place] [address]          Radiology Studies: CT Renal Stone Study  Result Date: 01/08/2020 CLINICAL DATA:  Predominately left flank pain EXAM: CT ABDOMEN AND PELVIS WITHOUT CONTRAST TECHNIQUE: Multidetector CT imaging of the abdomen and pelvis was performed following the standard protocol without IV contrast. COMPARISON:  09/15/2016 FINDINGS: Lower chest: Mild bibasilar atelectatic changes are noted. Hepatobiliary: No focal liver abnormality is seen. No gallstones, gallbladder wall thickening, or biliary dilatation. Pancreas: Unremarkable. No pancreatic ductal dilatation or surrounding inflammatory changes. Spleen: Normal in size without focal abnormality. Adrenals/Urinary Tract: Adrenal glands are well visualized and within normal limits. Right kidney demonstrates very mild hydronephrosis and hydroureter secondary to a proximal 5 mm right ureteral stone. The more distal right ureter is within normal limits. The bladder is decompressed. Left kidney is somewhat atrophic without significant hydronephrosis. There are however 2 small calculi identified within the distal left ureter these measure 1-2 mm in dimension. A slightly more proximal stone is noted within the left ureter best seen on image number 95 of series 4. Stomach/Bowel: Mild diverticular changes noted without evidence of diverticulitis. No obstructive or inflammatory changes of the colon are seen. The appendix is well visualized and within normal limits. No obstructive changes are noted in the small bowel. The stomach is within normal limits. Vascular/Lymphatic: Aortic atherosclerosis. No enlarged abdominal or pelvic lymph nodes. Reproductive: Prostate is unremarkable. Other: Fat containing anterior abdominal wall hernia is noted just above the umbilicus. The hernia neck measures approximately 3.1 cm. This is stable from the prior exam.  Additionally small fat containing inguinal hernias are noted bilaterally. Some fluid is noted within the right inguinal hernia. The fluid is new from the prior exam although the inguinal hernias are stable. Musculoskeletal: No acute or significant osseous findings. IMPRESSION: 5 mm right ureteral stone with hydronephrosis. Multiple tiny left ureteral calculi without significant obstructive change. Diverticulosis without diverticulitis. Stable fat containing anterior abdominal wall and inguinal hernias. Electronically Signed   By: Inez Catalina M.D.   On: 01/08/2020 16:25        Scheduled Meds: . aspirin EC  81 mg Oral Daily  . atorvastatin  20 mg Oral Daily  . carvedilol  12.5 mg Oral BID  . Chlorhexidine Gluconate Cloth  6 each Topical Daily  . cholecalciferol  2,000 Units Oral Daily  . escitalopram  20 mg Oral Daily  . finasteride  5 mg Oral Daily  . insulin aspart  0-9 Units Subcutaneous TID WC  . magnesium oxide  400 mg Oral Daily  . modafinil  200 mg Oral QHS  . oxybutynin  10 mg Oral QHS   Continuous Infusions:   LOS: 2 days     Cordelia Poche, MD [facility] 01/10/2020, 12:54 PM  If 7PM-7AM, please contact night-coverage [email or web address]

## 2020-01-11 DIAGNOSIS — N1330 Unspecified hydronephrosis: Secondary | ICD-10-CM | POA: Diagnosis not present

## 2020-01-11 DIAGNOSIS — N179 Acute kidney failure, unspecified: Secondary | ICD-10-CM | POA: Diagnosis not present

## 2020-01-11 DIAGNOSIS — N1832 Chronic kidney disease, stage 3b: Secondary | ICD-10-CM | POA: Diagnosis not present

## 2020-01-11 DIAGNOSIS — N200 Calculus of kidney: Secondary | ICD-10-CM | POA: Diagnosis not present

## 2020-01-11 HISTORY — PX: KIDNEY STONE SURGERY: SHX686

## 2020-01-11 LAB — BASIC METABOLIC PANEL
Anion gap: 10 (ref 5–15)
BUN: 54 mg/dL — ABNORMAL HIGH (ref 8–23)
CO2: 23 mmol/L (ref 22–32)
Calcium: 9 mg/dL (ref 8.9–10.3)
Chloride: 107 mmol/L (ref 98–111)
Creatinine, Ser: 2.97 mg/dL — ABNORMAL HIGH (ref 0.61–1.24)
GFR calc Af Amer: 23 mL/min — ABNORMAL LOW (ref >60)
GFR calc non Af Amer: 20 mL/min — ABNORMAL LOW (ref >60)
Glucose, Bld: 176 mg/dL — ABNORMAL HIGH (ref 70–99)
Potassium: 4.3 mmol/L (ref 3.5–5.1)
Sodium: 140 mmol/L (ref 135–145)

## 2020-01-11 LAB — GLUCOSE, CAPILLARY: Glucose-Capillary: 115 mg/dL — ABNORMAL HIGH (ref 70–99)

## 2020-01-11 NOTE — Discharge Summary (Signed)
Physician Discharge Summary  Keith [name]. EPP:[id number] DOB: 1946/03/25 DOA: 01/08/2020  PCP: Leanna Battles, MD  Admit date: 01/08/2020 Discharge date: 01/11/2020  Admitted From: Home Disposition: Home  Recommendations for Outpatient Follow-up:  1. Follow up with PCP in 1 week 2. Follow up with urology 3. Please obtain BMP in one week 4. Please follow up on the following pending results: None  Home Health: None Equipment/Devices: None  Discharge Condition: Stable CODE STATUS: Full code Diet recommendation: Heart healthy   Brief/Interim Summary:  Admission HPI written by Zada Finders, MD   Chief Complaint: Lower back pain  HPI: Keith [name]. is a 74 y.o. male with medical history significant for type 2 diabetes, CKD stage III, hypertension, hyperlipidemia, BPH s/p TURP, Depression/anxiety, and OSA on BiPAP who presents to the ED for evaluation of lower back pain with nausea and vomiting.  Patient reports new onset of frequent nausea and vomiting beginning 3 days ago.  He has been having progressive bilateral lower back pain since that time.  He also reports decreased urine output than what has been expected and says he had small amount of urine output last night but none since.  He denies any dysuria or pain with urination.  Today his back pain became so severe he presented to the ED for further evaluation.  He has had associated chills but denies any subjective fevers or diaphoresis.  He denies any chest pain.  He feels he has been having shallow breathing.  He denies any cough.  He denies any abdominal pain.    Hospital course:  AKI on CKD stage IIIb Obstructive urolithiasis Hydronephrosis of right kidney Baseline creatinine of difficult to ascertain but about 2 from 2019. Creatinine of 10.68 on admission. Patient underwent cystoscopy with bilateral ureteral stent placement on 4/28. Creatinine starting to trend down. Creatinine of 2.97 on discharge. Will need  repeat BMP as an outpatient. Follow-up with Urology.  Fever Associated leukocytosis. In setting of urolithiasis and recent stent placement. Per urology, no signs of an infected stone. Afebrile for the last 48 hours. Urine culture with insignificant growth and blood culture with no growth to date.  Diabetes mellitus, type 2 On Ozempic as an outpatient.  Essential hypertension Continue Coreg  Hyperlipidemia Continue Lipitor  Depression Anxiety Continue Lexapro  Obstructive sleep apnea Continue BiPAP qhs  BPH Continue finasteride  Discharge Diagnoses:  Principal Problem:   Acute renal failure superimposed on stage 3b chronic kidney disease (HCC) Active Problems:   Hypertension associated with diabetes ([place])   Sleep apnea treated with nocturnal BiPAP   Type 2 diabetes mellitus with stage 3 chronic kidney disease (HCC)   Hyperlipidemia associated with type 2 diabetes mellitus (HCC)   Renal calculus, bilateral   Hydronephrosis of right kidney   Renal failure    Discharge Instructions  Discharge Instructions    Increase activity slowly   Complete by: As directed      Allergies as of 01/11/2020      Reactions   Penicillins Anaphylaxis   eyes swell shut Has patient had a PCN reaction causing immediate rash, facial/tongue/throat swelling, SOB or lightheadedness with hypotension: yes Has patient had a PCN reaction causing severe rash involving mucus membranes or skin necrosis: no Has patient had a PCN reaction that required hospitalization no Has patient had a PCN reaction occurring within the last 10 years: no If all of the above answers are "NO", then may proceed with Cephalosporin use.   5-alpha Reductase  Inhibitors       Medication List    TAKE these medications   Armodafinil 250 MG tablet Take 250 mg by mouth at bedtime.   aspirin EC 81 MG tablet Take 81 mg by mouth daily.   atorvastatin 20 MG tablet Commonly known as: LIPITOR Take 20 mg by mouth  daily.   carvedilol 12.5 MG tablet Commonly known as: COREG Take 1 tablet (12.5 mg total) by mouth 2 (two) times daily.   escitalopram 20 MG tablet Commonly known as: LEXAPRO Take 20 mg by mouth daily.   finasteride 5 MG tablet Commonly known as: PROSCAR Take 5 mg by mouth daily.   Fish Oil 1000 MG Caps Take 1,000 mg by mouth daily.   gabapentin 100 MG capsule Commonly known as: NEURONTIN Take 1 capsule by mouth daily as needed (nerve pain).   magnesium oxide 400 MG tablet Commonly known as: MAG-OX Take 400 mg by mouth daily.   methocarbamol 500 MG tablet Commonly known as: Robaxin Take 1 tablet (500 mg total) by mouth every 6 (six) hours as needed for muscle spasms.   nitroGLYCERIN 0.4 MG SL tablet Commonly known as: Nitrostat Place 1 tablet (0.4 mg total) under the tongue every 5 (five) minutes as needed for chest pain.   oxybutynin 10 MG 24 hr tablet Commonly known as: DITROPAN-XL Take 10 mg by mouth at bedtime.   OZEMPIC (0.25 OR 0.5 MG/DOSE) [place] Inject 0.5 mg into the skin once a week. Friday.   Vitamin D 50 MCG (2000 UT) tablet Take 2,000 Units by mouth daily.      Follow-up Information    Leanna Battles, MD. Schedule an appointment as soon as possible for a visit in 1 week(s).   Specialty: Internal Medicine Why: Hospital follow-up Contact information: [place] [address] [phone]        [name], MD. Schedule an appointment as soon as possible for a visit in 1 week(s).   Specialty: Urology Contact information: [address] [place] [address] [phone]          Allergies  Allergen Reactions  . Penicillins Anaphylaxis    eyes swell shut  Has patient had a PCN reaction causing immediate rash, facial/tongue/throat swelling, SOB or lightheadedness with hypotension: yes Has patient had a PCN reaction causing severe rash involving mucus membranes or skin necrosis: no Has patient had a PCN reaction that  required hospitalization no Has patient had a PCN reaction occurring within the last 10 years: no If all of the above answers are "NO", then may proceed with Cephalosporin use.  Judeth Cornfield Reductase Inhibitors     Consultations:  Urology   Procedures/Studies: CT Renal Stone Study  Result Date: 01/08/2020 CLINICAL DATA:  Predominately left flank pain EXAM: CT ABDOMEN AND PELVIS WITHOUT CONTRAST TECHNIQUE: Multidetector CT imaging of the abdomen and pelvis was performed following the standard protocol without IV contrast. COMPARISON:  09/15/2016 FINDINGS: Lower chest: Mild bibasilar atelectatic changes are noted. Hepatobiliary: No focal liver abnormality is seen. No gallstones, gallbladder wall thickening, or biliary dilatation. Pancreas: Unremarkable. No pancreatic ductal dilatation or surrounding inflammatory changes. Spleen: Normal in size without focal abnormality. Adrenals/Urinary Tract: Adrenal glands are well visualized and within normal limits. Right kidney demonstrates very mild hydronephrosis and hydroureter secondary to a proximal 5 mm right ureteral stone. The more distal right ureter is within normal limits. The bladder is decompressed. Left kidney is somewhat atrophic without significant hydronephrosis. There are however 2 small calculi identified  within the distal left ureter these measure 1-2 mm in dimension. A slightly more proximal stone is noted within the left ureter best seen on image number 95 of series 4. Stomach/Bowel: Mild diverticular changes noted without evidence of diverticulitis. No obstructive or inflammatory changes of the colon are seen. The appendix is well visualized and within normal limits. No obstructive changes are noted in the small bowel. The stomach is within normal limits. Vascular/Lymphatic: Aortic atherosclerosis. No enlarged abdominal or pelvic lymph nodes. Reproductive: Prostate is unremarkable. Other: Fat containing anterior abdominal wall hernia is noted  just above the umbilicus. The hernia neck measures approximately 3.1 cm. This is stable from the prior exam. Additionally small fat containing inguinal hernias are noted bilaterally. Some fluid is noted within the right inguinal hernia. The fluid is new from the prior exam although the inguinal hernias are stable. Musculoskeletal: No acute or significant osseous findings. IMPRESSION: 5 mm right ureteral stone with hydronephrosis. Multiple tiny left ureteral calculi without significant obstructive change. Diverticulosis without diverticulitis. Stable fat containing anterior abdominal wall and inguinal hernias. Electronically Signed   By: Inez Catalina M.D.   On: 01/08/2020 16:25     Cystoscopy bilateral retrograde ureterogram's and bilateral ureteral stent insertion (01/08/2020)   Subjective: No issues overnight.  Discharge Exam: Vitals:   01/11/20 0601 01/11/20 0850  BP: 124/68 127/64  Pulse: 63 63  Resp: 17   Temp: (!) 97.4 F (36.3 C)   SpO2: 97%    Vitals:   01/10/20 2059 01/10/20 2312 01/11/20 0601 01/11/20 0850  BP: (!) 143/72 (!) 165/75 124/68 127/64  Pulse: (!) 56 60 63 63  Resp: 18  17   Temp: 98.3 F (36.8 C)  (!) 97.4 F (36.3 C)   TempSrc: Oral  Oral   SpO2: 97% 95% 97%   Weight:      Height:        General: Pt is alert, awake, not in acute distress Cardiovascular: RRR, S1/S2 +, no rubs, no gallops Respiratory: CTA bilaterally, no wheezing, no rhonchi Abdominal: Soft, NT, distended, bowel sounds + Extremities: no edema, no cyanosis    The results of significant diagnostics from this hospitalization (including imaging, microbiology, ancillary and laboratory) are listed below for reference.     Microbiology: Recent Results (from the past 240 hour(s))  Respiratory Panel by RT PCR (Flu A&B, Covid) - Nasopharyngeal Swab     Status: None   Collection Time: 01/08/20  6:18 PM   Specimen: Nasopharyngeal Swab  Result Value Ref Range Status   SARS Coronavirus 2 by RT  PCR NEGATIVE NEGATIVE Final    Comment: (NOTE) SARS-CoV-2 target nucleic acids are NOT DETECTED. The SARS-CoV-2 RNA is generally detectable in upper respiratoy specimens during the acute phase of infection. The lowest concentration of SARS-CoV-2 viral copies this assay can detect is 131 copies/mL. A negative result does not preclude SARS-Cov-2 infection and should not be used as the sole basis for treatment or other patient management decisions. A negative result may occur with  improper specimen collection/handling, submission of specimen other than nasopharyngeal swab, presence of viral mutation(s) within the areas targeted by this assay, and inadequate number of viral copies (<131 copies/mL). A negative result must be combined with clinical observations, patient history, and epidemiological information. The expected result is Negative. Fact Sheet for Patients:  PinkCheek.be Fact Sheet for Healthcare Providers:  GravelBags.it This test is not yet ap proved or cleared by the [place] FDA and  has been authorized for detection  and/or diagnosis of SARS-CoV-2 by FDA under an Emergency Use Authorization (EUA). This EUA will remain  in effect (meaning this test can be used) for the duration of the COVID-19 declaration under Section 564(b)(1) of the Act, 21 U.S.C. section 360bbb-3(b)(1), unless the authorization is terminated or revoked sooner.    Influenza A by PCR NEGATIVE NEGATIVE Final   Influenza B by PCR NEGATIVE NEGATIVE Final    Comment: (NOTE) The Xpert Xpress SARS-CoV-2/FLU/RSV assay is intended as an aid in  the diagnosis of influenza from Nasopharyngeal swab specimens and  should not be used as a sole basis for treatment. Nasal washings and  aspirates are unacceptable for Xpert Xpress SARS-CoV-2/FLU/RSV  testing. Fact Sheet for Patients: PinkCheek.be Fact Sheet for Healthcare  Providers: GravelBags.it This test is not yet approved or cleared by the [place] FDA and  has been authorized for detection and/or diagnosis of SARS-CoV-2 by  FDA under an Emergency Use Authorization (EUA). This EUA will remain  in effect (meaning this test can be used) for the duration of the  Covid-19 declaration under Section 564(b)(1) of the Act, 21  U.S.C. section 360bbb-3(b)(1), unless the authorization is  terminated or revoked. Performed at [facility], [place] [address] [place], [place] [address]   Culture, blood (routine x 2)     Status: None (Preliminary result)   Collection Time: 01/09/20  1:13 PM   Specimen: BLOOD  Result Value Ref Range Status   Specimen Description   Final    BLOOD RIGHT ARM Performed at [facility] [address], [place], [place] [address]    Special Requests   Final    BOTTLES DRAWN AEROBIC AND ANAEROBIC Blood Culture results may not be optimal due to an excessive volume of blood received in culture bottles Performed at [facility] [address], [place], [place] [address]    Culture   Final    NO GROWTH 2 DAYS Performed at [facility] [address] [place], [place] [address]    Report Status PENDING  Incomplete  Culture, blood (routine x 2)     Status: None (Preliminary result)   Collection Time: 01/09/20  1:19 PM   Specimen: BLOOD RIGHT HAND  Result Value Ref Range Status   Specimen Description   Final    BLOOD RIGHT HAND Performed at [facility] [address], [place], [place] [address]    Special Requests   Final    BOTTLES DRAWN AEROBIC AND ANAEROBIC Blood Culture adequate volume Performed at [facility] [address] [place], [place] [address]    Culture   Final    NO GROWTH 2 DAYS Performed at [facility] [address] [place], [place] [address]    Report Status PENDING   Incomplete  Culture, Urine     Status: Abnormal   Collection Time: 01/09/20  3:56 PM   Specimen: Urine, Catheterized  Result Value Ref Range Status   Specimen Description   Final    URINE, CATHETERIZED Performed at [facility] [address], [place], [place] [address]    Special Requests   Final    NONE Performed at [facility], [place] [address] [place], [place] [address]    Culture (A)  Final    <10,000 COLONIES/mL INSIGNIFICANT GROWTH Performed at [facility] [address] [place], [place] [address]    Report Status 01/10/2020 FINAL  Final     Labs: BNP (last 3 results) No results for input(s): BNP in the last 8760 hours. Basic Metabolic Panel: Recent Labs  Lab 01/08/20 1546 01/09/20 0510 01/10/20 0434 01/11/20 0810  NA 131* 133* 135 140  K 4.7 4.3 4.1 4.3  CL 101 104 107 107  CO2 19* 15* 19* 23  GLUCOSE 125* 126* 154* 176*  BUN 73* 67* 58* 54*  CREATININE 10.68* 7.02* 4.10* 2.97*  CALCIUM 8.1* 8.3* 8.4* 9.0   Liver Function Tests: Recent Labs  Lab 01/08/20 1546  AST 14*  ALT 14  ALKPHOS 69  BILITOT 0.6  PROT 6.6  ALBUMIN 3.4*   No results for input(s): LIPASE, AMYLASE in the last 168 hours. No results for input(s): AMMONIA in the last 168 hours. CBC: Recent Labs  Lab 01/08/20 1546 01/09/20 0510 01/10/20 0434  WBC 7.9 11.4* 6.5  NEUTROABS 6.2  --   --   HGB 13.0 13.2 13.0  HCT 40.0 39.7 39.6  MCV 94.3 92.8 93.6  PLT 158 156 149*   Cardiac Enzymes: No results for input(s): CKTOTAL, CKMB, CKMBINDEX, TROPONINI in the last 168 hours. BNP: Invalid input(s): POCBNP CBG: Recent Labs  Lab 01/10/20 0726 01/10/20 1200 01/10/20 1645 01/10/20 2013 01/11/20 0737  GLUCAP 138* 178* 99 145* 115*   D-Dimer No results for input(s): DDIMER in the last 72 hours. Hgb A1c Recent Labs    01/08/20 1546  HGBA1C 7.4*   Lipid Profile No results for input(s): CHOL, HDL, LDLCALC, TRIG, CHOLHDL, LDLDIRECT  in the last 72 hours. Thyroid function studies No results for input(s): TSH, T4TOTAL, T3FREE, THYROIDAB in the last 72 hours.  Invalid input(s): FREET3 Anemia work up No results for input(s): VITAMINB12, FOLATE, FERRITIN, TIBC, IRON, RETICCTPCT in the last 72 hours. Urinalysis    Component Value Date/Time   COLORURINE YELLOW 08/05/2015 1855   APPEARANCEUR CLEAR 08/05/2015 1855   LABSPEC 1.026 08/05/2015 1855   PHURINE 5.0 08/05/2015 1855   GLUCOSEU >1000 (A) 08/05/2015 1855   HGBUR MODERATE (A) 08/05/2015 1855   HGBUR negative 08/19/2010 1200   BILIRUBINUR NEGATIVE 08/05/2015 1855   BILIRUBINUR 2+ 04/19/2013 1630   KETONESUR NEGATIVE 08/05/2015 1855   PROTEINUR NEGATIVE 08/05/2015 1855   UROBILINOGEN 1.0 04/19/2013 1630   UROBILINOGEN 0.2 07/26/2011 0934   NITRITE NEGATIVE 08/05/2015 1855   LEUKOCYTESUR NEGATIVE 08/05/2015 1855   Sepsis Labs Invalid input(s): PROCALCITONIN,  WBC,  LACTICIDVEN Microbiology Recent Results (from the past 240 hour(s))  Respiratory Panel by RT PCR (Flu A&B, Covid) - Nasopharyngeal Swab     Status: None   Collection Time: 01/08/20  6:18 PM   Specimen: Nasopharyngeal Swab  Result Value Ref Range Status   SARS Coronavirus 2 by RT PCR NEGATIVE NEGATIVE Final    Comment: (NOTE) SARS-CoV-2 target nucleic acids are NOT DETECTED. The SARS-CoV-2 RNA is generally detectable in upper respiratoy specimens during the acute phase of infection. The lowest concentration of SARS-CoV-2 viral copies this assay can detect is 131 copies/mL. A negative result does not preclude SARS-Cov-2 infection and should not be used as the sole basis for treatment or other patient management decisions. A negative result may occur with  improper specimen collection/handling, submission of specimen other than nasopharyngeal swab, presence of viral mutation(s) within the areas targeted by this assay, and inadequate number of viral copies (<131 copies/mL). A negative result must  be combined with clinical observations, patient history, and epidemiological information. The expected result is Negative. Fact Sheet for Patients:  PinkCheek.be  Fact Sheet for Healthcare Providers:  GravelBags.it This test is not yet ap proved or cleared by the [place] FDA and  has been authorized for detection and/or diagnosis of SARS-CoV-2 by FDA under an Emergency Use Authorization (EUA). This EUA will remain  in effect (meaning this test can be used) for the duration of the COVID-19 declaration under Section 564(b)(1) of the Act, 21 U.S.C. section 360bbb-3(b)(1), unless the authorization is terminated or revoked sooner.    Influenza A by PCR NEGATIVE NEGATIVE Final   Influenza B by PCR NEGATIVE NEGATIVE Final    Comment: (NOTE) The Xpert Xpress SARS-CoV-2/FLU/RSV assay is intended as an aid in  the diagnosis of influenza from Nasopharyngeal swab specimens and  should not be used as a sole basis for treatment. Nasal washings and  aspirates are unacceptable for Xpert Xpress SARS-CoV-2/FLU/RSV  testing. Fact Sheet for Patients: PinkCheek.be Fact Sheet for Healthcare Providers: GravelBags.it This test is not yet approved or cleared by the [place] FDA and  has been authorized for detection and/or diagnosis of SARS-CoV-2 by  FDA under an Emergency Use Authorization (EUA). This EUA will remain  in effect (meaning this test can be used) for the duration of the  Covid-19 declaration under Section 564(b)(1) of the Act, 21  U.S.C. section 360bbb-3(b)(1), unless the authorization is  terminated or revoked. Performed at [facility], [place] [address], [place], [place] [address]   Culture, blood (routine x 2)     Status: None (Preliminary result)   Collection Time: 01/09/20  1:13 PM   Specimen: BLOOD  Result Value Ref Range Status   Specimen  Description   Final    BLOOD RIGHT ARM Performed at [facility] [address], [place], [place] [address]    Special Requests   Final    BOTTLES DRAWN AEROBIC AND ANAEROBIC Blood Culture results may not be optimal due to an excessive volume of blood received in culture bottles Performed at [facility] [address], [place], [place] [address]    Culture   Final    NO GROWTH 2 DAYS Performed at [facility] [address] [place], [place] [address]    Report Status PENDING  Incomplete  Culture, blood (routine x 2)     Status: None (Preliminary result)   Collection Time: 01/09/20  1:19 PM   Specimen: BLOOD RIGHT HAND  Result Value Ref Range Status   Specimen Description   Final    BLOOD RIGHT HAND Performed at [facility] [address], [place], [place] [address]    Special Requests   Final    BOTTLES DRAWN AEROBIC AND ANAEROBIC Blood Culture adequate volume Performed at [facility] [address], [place], [place] [address]    Culture   Final    NO GROWTH 2 DAYS Performed at [facility] [address], [place], [place] [address]    Report Status PENDING  Incomplete  Culture, Urine     Status: Abnormal   Collection Time: 01/09/20  3:56 PM   Specimen: Urine, Catheterized  Result Value Ref Range Status   Specimen Description   Final    URINE, CATHETERIZED Performed at [facility] [address], [place], [place] [address]    Special Requests   Final    NONE Performed at [facility], [place] [address], [place],  [address]    Culture (A)  Final    <  10,000 COLONIES/mL INSIGNIFICANT GROWTH Performed at [facility], [place] [address], [place], [place] [address]    Report Status 01/10/2020 FINAL  Final     Time coordinating discharge: 35 minutes  SIGNED:   Cordelia Poche, MD [facility] 01/11/2020, 10:05 AM

## 2020-01-11 NOTE — Discharge Instructions (Signed)
Keith Drain.,  You were in the hospital secondary to kidney stones with associated blockage. You had stents placed by the urologist and are doing much better. Please follow-up with Dr. Jeffie Pollock.

## 2020-01-14 ENCOUNTER — Other Ambulatory Visit: Payer: Self-pay | Admitting: Urology

## 2020-01-14 LAB — CULTURE, BLOOD (ROUTINE X 2)
Culture: NO GROWTH
Culture: NO GROWTH
Special Requests: ADEQUATE

## 2020-01-17 ENCOUNTER — Other Ambulatory Visit: Payer: Self-pay

## 2020-01-17 ENCOUNTER — Ambulatory Visit (INDEPENDENT_AMBULATORY_CARE_PROVIDER_SITE_OTHER): Payer: Medicare Other | Admitting: Podiatry

## 2020-01-17 ENCOUNTER — Encounter: Payer: Self-pay | Admitting: Podiatry

## 2020-01-17 VITALS — Temp 97.8°F

## 2020-01-17 DIAGNOSIS — B351 Tinea unguium: Secondary | ICD-10-CM

## 2020-01-17 DIAGNOSIS — M79675 Pain in left toe(s): Secondary | ICD-10-CM

## 2020-01-17 DIAGNOSIS — M79674 Pain in right toe(s): Secondary | ICD-10-CM | POA: Diagnosis not present

## 2020-01-17 NOTE — Patient Instructions (Signed)
Diabetes Mellitus and Foot Care Foot care is an important part of your health, especially when you have diabetes. Diabetes may cause you to have problems because of poor blood flow (circulation) to your feet and legs, which can cause your skin to:  Become thinner and drier.  Break more easily.  Heal more slowly.  Peel and crack. You may also have nerve damage (neuropathy) in your legs and feet, causing decreased feeling in them. This means that you may not notice minor injuries to your feet that could lead to more serious problems. Noticing and addressing any potential problems early is the best way to prevent future foot problems. How to care for your feet Foot hygiene  Wash your feet daily with warm water and mild soap. Do not use hot water. Then, pat your feet and the areas between your toes until they are completely dry. Do not soak your feet as this can dry your skin.  Trim your toenails straight across. Do not dig under them or around the cuticle. File the edges of your nails with an emery board or nail file.  Apply a moisturizing lotion or petroleum jelly to the skin on your feet and to dry, brittle toenails. Use lotion that does not contain alcohol and is unscented. Do not apply lotion between your toes. Shoes and socks  Wear clean socks or stockings every day. Make sure they are not too tight. Do not wear knee-high stockings since they may decrease blood flow to your legs.  Wear shoes that fit properly and have enough cushioning. Always look in your shoes before you put them on to be sure there are no objects inside.  To break in new shoes, wear them for just a few hours a day. This prevents injuries on your feet. Wounds, scrapes, corns, and calluses  Check your feet daily for blisters, cuts, bruises, sores, and redness. If you cannot see the bottom of your feet, use a mirror or ask someone for help.  Do not cut corns or calluses or try to remove them with medicine.  If you  find a minor scrape, cut, or break in the skin on your feet, keep it and the skin around it clean and dry. You may clean these areas with mild soap and water. Do not clean the area with peroxide, alcohol, or iodine.  If you have a wound, scrape, corn, or callus on your foot, look at it several times a day to make sure it is healing and not infected. Check for: ? Redness, swelling, or pain. ? Fluid or blood. ? Warmth. ? Pus or a bad smell. General instructions  Do not cross your legs. This may decrease blood flow to your feet.  Do not use heating pads or hot water bottles on your feet. They may burn your skin. If you have lost feeling in your feet or legs, you may not know this is happening until it is too late.  Protect your feet from hot and cold by wearing shoes, such as at the beach or on hot pavement.  Schedule a complete foot exam at least once a year (annually) or more often if you have foot problems. If you have foot problems, report any cuts, sores, or bruises to your health care provider immediately. Contact a health care provider if:  You have a medical condition that increases your risk of infection and you have any cuts, sores, or bruises on your feet.  You have an injury that is not   healing.  You have redness on your legs or feet.  You feel burning or tingling in your legs or feet.  You have pain or cramps in your legs and feet.  Your legs or feet are numb.  Your feet always feel cold.  You have pain around a toenail. Get help right away if:  You have a wound, scrape, corn, or callus on your foot and: ? You have pain, swelling, or redness that gets worse. ? You have fluid or blood coming from the wound, scrape, corn, or callus. ? Your wound, scrape, corn, or callus feels warm to the touch. ? You have pus or a bad smell coming from the wound, scrape, corn, or callus. ? You have a fever. ? You have a red line going up your leg. Summary  Check your feet every day  for cuts, sores, red spots, swelling, and blisters.  Moisturize feet and legs daily.  Wear shoes that fit properly and have enough cushioning.  If you have foot problems, report any cuts, sores, or bruises to your health care provider immediately.  Schedule a complete foot exam at least once a year (annually) or more often if you have foot problems. This information is not intended to replace advice given to you by your health care provider. Make sure you discuss any questions you have with your health care provider. Document Revised: 05/22/2019 Document Reviewed: 09/30/2016 Elsevier Patient Education  [place].  Edema  Edema is an abnormal buildup of fluids in the body tissues and under the skin. Swelling of the legs, feet, and ankles is a common symptom that becomes more likely as you get older. Swelling is also common in looser tissues, like around the eyes. When the affected area is squeezed, the fluid may move out of that spot and leave a dent for a few moments. This dent is called pitting edema. There are many possible causes of edema. Eating too much salt (sodium) and being on your feet or sitting for a long time can cause edema in your legs, feet, and ankles. Hot weather may make edema worse. Common causes of edema include:  Heart failure.  Liver or kidney disease.  Weak leg blood vessels.  Cancer.  An injury.  Pregnancy.  Medicines.  Being obese.  Low protein levels in the blood. Edema is usually painless. Your skin may look swollen or shiny. Follow these instructions at home:  Keep the affected body part raised (elevated) above the level of your heart when you are sitting or lying down.  Do not sit still or stand for long periods of time.  Do not wear tight clothing. Do not wear garters on your upper legs.  Exercise your legs to get your circulation going. This helps to move the fluid back into your blood vessels, and it may help the swelling go  down.  Wear elastic bandages or support stockings to reduce swelling as told by your health care provider.  Eat a low-salt (low-sodium) diet to reduce fluid as told by your health care provider.  Depending on the cause of your swelling, you may need to limit how much fluid you drink (fluid restriction).  Take over-the-counter and prescription medicines only as told by your health care provider. Contact a health care provider if:  Your edema does not get better with treatment.  You have heart, liver, or kidney disease and have symptoms of edema.  You have sudden and unexplained weight gain. Get help right away if:  You develop shortness of breath or chest pain.  You cannot breathe when you lie down.  You develop pain, redness, or warmth in the swollen areas.  You have heart, liver, or kidney disease and suddenly get edema.  You have a fever and your symptoms suddenly get worse. Summary  Edema is an abnormal buildup of fluids in the body tissues and under the skin.  Eating too much salt (sodium) and being on your feet or sitting for a long time can cause edema in your legs, feet, and ankles.  Keep the affected body part raised (elevated) above the level of your heart when you are sitting or lying down. This information is not intended to replace advice given to you by your health care provider. Make sure you discuss any questions you have with your health care provider. Document Revised: 01/16/2019 Document Reviewed: 10/01/2016 Elsevier Patient Education  [place].

## 2020-01-17 NOTE — Progress Notes (Signed)
Subjective: Keith [name]. is a 74 y.o. male patient seen today at [facility]. Pt has h/o NIDDM with chronic kidney disease and painful mycotic nails b/l that are difficult to trim. Pain interferes with ambulation. Aggravating factors include wearing enclosed shoe gear. Pain is relieved with periodic professional debridement. He voices no new pedal concerns on today's visit.  Patient Active Problem List   Diagnosis Date Noted  . Acute renal failure superimposed on stage 3b chronic kidney disease ([place]) 01/08/2020  . Type 2 diabetes mellitus with stage 3 chronic kidney disease ([place]) 01/08/2020  . Hyperlipidemia associated with type 2 diabetes mellitus ([place]) 01/08/2020  . Renal calculus, bilateral 01/08/2020  . Hydronephrosis of right kidney 01/08/2020  . Renal failure 01/08/2020  . Primary central sleep apnea 12/18/2019  . Sleep apnea treated with nocturnal BiPAP 12/18/2019  . Aftercare 08/13/2018  . Obese 05/23/2018  . S/P left TKA 05/22/2018  . Depression 02/13/2018  . BPH with urinary obstruction 06/25/2013  . Bladder stones 06/25/2013  . Diabetes type 2, uncontrolled ([place]) 08/14/2012  . History of total knee replacement, right 08/01/2011  . DJD (degenerative joint disease) of knee 07/20/2011  . Hyperlipidemia 07/11/2011  . RHINITIS 01/28/2010  . MIGRAINE, CHRONIC 11/28/2008  . ERECTILE DYSFUNCTION, MILD 05/28/2008  . BENIGN PROSTATIC HYPERTROPHY 03/28/2008  . Hypertension associated with diabetes ([place]) 07/09/2007    Current Outpatient Medications on File Prior to Visit  Medication Sig Dispense Refill  . Armodafinil 250 MG tablet Take 250 mg by mouth at bedtime.    Marland Kitchen aspirin EC 81 MG tablet Take 81 mg by mouth daily.    Marland Kitchen atorvastatin (LIPITOR) 20 MG tablet Take 20 mg by mouth daily.    . carvedilol (COREG) 12.5 MG tablet Take 1 tablet (12.5 mg total) by mouth 2 (two) times daily. 60 tablet 2  . Cholecalciferol (VITAMIN D) 2000 UNITS tablet Take 2,000 Units by mouth daily.     Marland Kitchen escitalopram (LEXAPRO) 20 MG tablet Take 20 mg by mouth daily.     . finasteride (PROSCAR) 5 MG tablet Take 5 mg by mouth daily.    Marland Kitchen gabapentin (NEURONTIN) 100 MG capsule Take 1 capsule by mouth daily as needed (nerve pain).     . magnesium oxide (MAG-OX) 400 MG tablet Take 400 mg by mouth daily.    . methocarbamol (ROBAXIN) 500 MG tablet Take 1 tablet (500 mg total) by mouth every 6 (six) hours as needed for muscle spasms. (Patient not taking: Reported on 01/08/2020) 40 tablet 0  . nitroGLYCERIN (NITROSTAT) 0.4 MG SL tablet Place 1 tablet (0.4 mg total) under the tongue every 5 (five) minutes as needed for chest pain. 25 tablet 3  . Omega-3 Fatty Acids (FISH OIL) 1000 MG CAPS Take 1,000 mg by mouth daily.    Marland Kitchen oxybutynin (DITROPAN-XL) 10 MG 24 hr tablet Take 10 mg by mouth at bedtime.     . Semaglutide (OZEMPIC, 0.25 OR 0.5 MG/DOSE, [place]) Inject 0.5 mg into the skin once a week. Friday.     No current facility-administered medications on file prior to visit.    Allergies  Allergen Reactions  . Penicillins Anaphylaxis    eyes swell shut  Has patient had a PCN reaction causing immediate rash, facial/tongue/throat swelling, SOB or lightheadedness with hypotension: yes Has patient had a PCN reaction causing severe rash involving mucus membranes or skin necrosis: no Has patient had a PCN reaction that required hospitalization no Has patient had a PCN reaction occurring  within the last 10 years: no If all of the above answers are "NO", then may proceed with Cephalosporin use.  Judeth Cornfield Reductase Inhibitors     Objective: Physical Exam  General: Keith [name] is a 74 y.o. Caucasian male, well developed, wellnourished, no acute distress.  AAO x 3.   Vascular:  Capillary refill time to digits immediate b/l. Palpable DP pulses b/l. Palpable PT pulses b/l. Pedal hair sparse b/l. Skin temperature gradient within normal limits b/l. Ankle edema noted b/l LE. Evidence of chronic venous  insufficiency b/l LE.  Dermatological:  Pedal skin with normal turgor, texture and tone bilaterally. No open wounds bilaterally. No interdigital macerations bilaterally. Toenails 1-5 b/l elongated, dystrophic, thickened, crumbly with subungual debris and tenderness to dorsal palpation.  Musculoskeletal:  Normal muscle strength 5/5 to all lower extremity muscle groups bilaterally. No gross bony deformities bilaterally. No pain crepitus or joint limitation noted with ROM b/l. Patient ambulates independent of any assistive aids.  Neurological:  Protective sensation intact 5/5 intact bilaterally with 10g monofilament b/l. Vibratory sensation intact b/l. Proprioception intact bilaterally.  Assessment and Plan:  Problem List Items Addressed This Visit    None    Visit Diagnoses    Pain due to onychomycosis of toenails of both feet    -  Primary     -Examined patient.  -No new findings. No new orders. -Continue diabetic foot care principles. Literature dispensed on today.  -Toenails 1-5 b/l were debrided in length and girth with sterile nail nippers and dremel without iatrogenic bleeding.  -Patient to continue soft, supportive shoe gear daily. -Patient to report any pedal injuries to medical professional immediately. -Patient/POA to call should there be question/concern in the interim.  Return in about 3 months (around 04/18/2020) for diabetic nail trim.  Marzetta Board, DPM

## 2020-01-20 DIAGNOSIS — E1129 Type 2 diabetes mellitus with other diabetic kidney complication: Secondary | ICD-10-CM | POA: Diagnosis not present

## 2020-01-20 DIAGNOSIS — N3281 Overactive bladder: Secondary | ICD-10-CM | POA: Diagnosis not present

## 2020-01-20 DIAGNOSIS — N1832 Chronic kidney disease, stage 3b: Secondary | ICD-10-CM | POA: Diagnosis not present

## 2020-01-20 DIAGNOSIS — I2510 Atherosclerotic heart disease of native coronary artery without angina pectoris: Secondary | ICD-10-CM | POA: Diagnosis not present

## 2020-01-20 DIAGNOSIS — I129 Hypertensive chronic kidney disease with stage 1 through stage 4 chronic kidney disease, or unspecified chronic kidney disease: Secondary | ICD-10-CM | POA: Diagnosis not present

## 2020-01-20 DIAGNOSIS — N200 Calculus of kidney: Secondary | ICD-10-CM | POA: Diagnosis not present

## 2020-01-20 DIAGNOSIS — G4731 Primary central sleep apnea: Secondary | ICD-10-CM | POA: Diagnosis not present

## 2020-01-22 NOTE — Progress Notes (Signed)
PCP - Bevelyn Buckles Cardiologist - LOV 12-23-19 epic Dr. [name]  Chest x-ray -  EKG - 01-09-20 epic Stress Test - 10-09-19 epic ECHO - 09-25-19 epic Cardiac Cath -  hgba1c epic 01-08-20   7.4  Sleep Study -  CPAP -   Fasting Blood Sugar -  Checks Blood Sugar _____ times a day  Blood Thinner Instructions: Aspirin Instructions: Last Dose:  Anesthesia review: OSA BIPAP, ef 40-45%, creatine 2.13  Patient denies shortness of breath, fever, cough and chest pain at PAT appointment   none   Patient verbalized understanding of instructions that were given to them at the PAT appointment. Patient was also instructed that they will need to review over the PAT instructions again at home before surgery.

## 2020-01-22 NOTE — Patient Instructions (Addendum)
DUE TO COVID-19 ONLY ONE VISITOR IS ALLOWED TO COME WITH YOU AND STAY IN THE WAITING ROOM ONLY DURING PRE OP AND PROCEDURE DAY OF SURGERY. Two  VISITOR MAY VISIT WITH YOU AFTER SURGERY IN YOUR PRIVATE ROOM DURING VISITING HOURS ONLY!   10a-8p  YOU NEED TO HAVE A COVID 19 TEST ON_5-17-21______ @__10 :10 am _____, THIS TEST MUST BE DONE BEFORE SURGERY, COME  [address], [place] , [id number].  ([place]) ONCE YOUR COVID TEST IS COMPLETED, PLEASE BEGIN THE QUARANTINE INSTRUCTIONS AS OUTLINED IN YOUR HANDOUT.                Kaidin Boehle.  01/22/2020   Your procedure is scheduled on: 01-30-20   Report to [facility] Main  Entrance   Report to admitting at     0930   AM     Call this number if you have problems the morning of surgery [phone]    Remember: Do not eat food or drink liquids :After Midnight.   BRUSH YOUR TEETH MORNING OF SURGERY AND RINSE YOUR MOUTH OUT, NO CHEWING GUM CANDY OR MINTS.     Take these medicines the morning of surgery with A SIP OF WATER: finasteride, lexapro, carvedilol, lipitor  DO NOT TAKE ANY DIABETIC MEDICATIONS DAY OF YOUR SURGERY                               You may not have any metal on your body including hair pins and              piercings  Do not wear jewelry,  lotions, powders or perfumes, deodorant                      Men may shave face and neck.   Do not bring valuables to the hospital. [place].  Contacts, dentures or bridgework may not be worn into surgery.      Patients discharged the day of surgery will not be allowed to drive home. IF YOU ARE HAVING SURGERY AND GOING HOME THE SAME DAY, YOU MUST HAVE AN ADULT TO DRIVE YOU HOME AND BE WITH YOU FOR 24 HOURS. YOU MAY GO HOME BY TAXI OR UBER OR ORTHERWISE, BUT AN ADULT MUST ACCOMPANY YOU HOME AND STAY WITH YOU FOR 24 HOURS.  Name and phone number of your driver:  Special Instructions: N/A    Please read over the following fact sheets you were given: _____________________________________________________________________             [facility] - Preparing for Surgery Before surgery, you can play an important role.  Because skin is not sterile, your skin needs to be as free of germs as possible.  You can reduce the number of germs on your skin by washing with CHG (chlorahexidine gluconate) soap before surgery.  CHG is an antiseptic cleaner which kills germs and bonds with the skin to continue killing germs even after washing. Please DO NOT use if you have an allergy to CHG or antibacterial soaps.  If your skin becomes reddened/irritated stop using the CHG and inform your nurse when you arrive at Short Stay. Do not shave (including legs and underarms) for at least 48 hours prior to the first CHG shower.  You may shave  your face/neck. Please follow these instructions carefully:  1.  Shower with CHG Soap the night before surgery and the  morning of Surgery.  2.  If you choose to wash your hair, wash your hair first as usual with your  normal  shampoo.  3.  After you shampoo, rinse your hair and body thoroughly to remove the  shampoo.                           4.  Use CHG as you would any other liquid soap.  You can apply chg directly  to the skin and wash                       Gently with a scrungie or clean washcloth.  5.  Apply the CHG Soap to your body ONLY FROM THE NECK DOWN.   Do not use on face/ open                           Wound or open sores. Avoid contact with eyes, ears mouth and genitals (private parts).                       Wash face,  Genitals (private parts) with your normal soap.             6.  Wash thoroughly, paying special attention to the area where your surgery  will be performed.  7.  Thoroughly rinse your body with warm water from the neck down.  8.  DO NOT shower/wash with your normal soap after using and rinsing off  the CHG Soap.                9.  Pat  yourself dry with a clean towel.            10.  Wear clean pajamas.            11.  Place clean sheets on your bed the night of your first shower and do not  sleep with pets. Day of Surgery : Do not apply any lotions/deodorants the morning of surgery.  Please wear clean clothes to the hospital/surgery center.  FAILURE TO FOLLOW THESE INSTRUCTIONS MAY RESULT IN THE CANCELLATION OF YOUR SURGERY PATIENT SIGNATURE_________________________________  NURSE SIGNATURE__________________________________  ________________________________________________________________________

## 2020-01-23 ENCOUNTER — Encounter (HOSPITAL_COMMUNITY)
Admission: RE | Admit: 2020-01-23 | Discharge: 2020-01-23 | Disposition: A | Discharge status: Home / Self Care | Payer: Medicare Other | Source: Ambulatory Visit | Attending: Urology | Admitting: Urology

## 2020-01-23 ENCOUNTER — Encounter (HOSPITAL_COMMUNITY): Payer: Self-pay

## 2020-01-23 ENCOUNTER — Other Ambulatory Visit: Payer: Self-pay

## 2020-01-23 DIAGNOSIS — N270 Small kidney, unilateral: Secondary | ICD-10-CM | POA: Diagnosis not present

## 2020-01-23 DIAGNOSIS — E785 Hyperlipidemia, unspecified: Secondary | ICD-10-CM | POA: Insufficient documentation

## 2020-01-23 DIAGNOSIS — F419 Anxiety disorder, unspecified: Secondary | ICD-10-CM | POA: Insufficient documentation

## 2020-01-23 DIAGNOSIS — Z01812 Encounter for preprocedural laboratory examination: Secondary | ICD-10-CM | POA: Insufficient documentation

## 2020-01-23 DIAGNOSIS — F329 Major depressive disorder, single episode, unspecified: Secondary | ICD-10-CM | POA: Diagnosis not present

## 2020-01-23 DIAGNOSIS — I509 Heart failure, unspecified: Secondary | ICD-10-CM | POA: Insufficient documentation

## 2020-01-23 DIAGNOSIS — N183 Chronic kidney disease, stage 3 unspecified: Secondary | ICD-10-CM | POA: Diagnosis not present

## 2020-01-23 DIAGNOSIS — Z79899 Other long term (current) drug therapy: Secondary | ICD-10-CM | POA: Insufficient documentation

## 2020-01-23 DIAGNOSIS — I129 Hypertensive chronic kidney disease with stage 1 through stage 4 chronic kidney disease, or unspecified chronic kidney disease: Secondary | ICD-10-CM | POA: Insufficient documentation

## 2020-01-23 DIAGNOSIS — N201 Calculus of ureter: Secondary | ICD-10-CM | POA: Insufficient documentation

## 2020-01-23 DIAGNOSIS — N132 Hydronephrosis with renal and ureteral calculous obstruction: Secondary | ICD-10-CM | POA: Diagnosis not present

## 2020-01-23 DIAGNOSIS — Z7901 Long term (current) use of anticoagulants: Secondary | ICD-10-CM | POA: Diagnosis not present

## 2020-01-23 DIAGNOSIS — E1122 Type 2 diabetes mellitus with diabetic chronic kidney disease: Secondary | ICD-10-CM | POA: Insufficient documentation

## 2020-01-23 DIAGNOSIS — N202 Calculus of kidney with calculus of ureter: Secondary | ICD-10-CM | POA: Diagnosis not present

## 2020-01-23 DIAGNOSIS — Z7982 Long term (current) use of aspirin: Secondary | ICD-10-CM | POA: Diagnosis not present

## 2020-01-23 DIAGNOSIS — G4730 Sleep apnea, unspecified: Secondary | ICD-10-CM | POA: Diagnosis not present

## 2020-01-23 HISTORY — DX: Cardiac arrhythmia, unspecified: I49.9

## 2020-01-23 HISTORY — DX: Heart failure, unspecified: I50.9

## 2020-01-23 HISTORY — DX: Cardiomyopathy, unspecified: I42.9

## 2020-01-23 HISTORY — DX: Personal history of urinary calculi: Z87.442

## 2020-01-23 LAB — CBC
HCT: 38.7 % — ABNORMAL LOW (ref 39.0–52.0)
Hemoglobin: 12.3 g/dL — ABNORMAL LOW (ref 13.0–17.0)
MCH: 30.2 pg (ref 26.0–34.0)
MCHC: 31.8 g/dL (ref 30.0–36.0)
MCV: 95.1 fL (ref 80.0–100.0)
Platelets: 244 10*3/uL (ref 150–400)
RBC: 4.07 MIL/uL — ABNORMAL LOW (ref 4.22–5.81)
RDW: 12.9 % (ref 11.5–15.5)
WBC: 8.4 10*3/uL (ref 4.0–10.5)
nRBC: 0 % (ref 0.0–0.2)

## 2020-01-23 LAB — BASIC METABOLIC PANEL
Anion gap: 8 (ref 5–15)
BUN: 31 mg/dL — ABNORMAL HIGH (ref 8–23)
CO2: 25 mmol/L (ref 22–32)
Calcium: 9 mg/dL (ref 8.9–10.3)
Chloride: 106 mmol/L (ref 98–111)
Creatinine, Ser: 2.13 mg/dL — ABNORMAL HIGH (ref 0.61–1.24)
GFR calc Af Amer: 35 mL/min — ABNORMAL LOW (ref >60)
GFR calc non Af Amer: 30 mL/min — ABNORMAL LOW (ref >60)
Glucose, Bld: 191 mg/dL — ABNORMAL HIGH (ref 70–99)
Potassium: 4.7 mmol/L (ref 3.5–5.1)
Sodium: 139 mmol/L (ref 135–145)

## 2020-01-23 NOTE — ED Provider Notes (Signed)
Keith [name] Provider Note   CSN: [id number] Arrival date & time: 01/08/20  1047     History No chief complaint on file.   Keith [name]. is a 74 y.o. male with PMH significant for HTN, CAD, HLD, type II DM, kidney stones, and BPH with urinary obstruction presented to the ED with a 3-day history of of low back pain with associated nausea and vomiting.  Patient states that his back discomfort was bilateral and progressive in severity.  He denies any fevers or chills, recent infection, chest pain or cough, abdominal pain, dysuria, increased urinary frequency, or changes in bowel habits.  He was accompanied by his wife.  HPI     Past Medical History:  Diagnosis Date  . Anginal pain ([place])   . Anxiety    takes Celexa  . Arthritis    bil knees  . Benign prostatic hypertrophy    takes Flomax daily  . Bladder stones 06/25/2013   1.9cm bladder stone.  Marland Kitchen BPH with urinary obstruction 06/25/2013   127cc prostate with large middle lobe and bladder stones.   . Cardiomyopathy ([place])   . CHF (congestive heart failure) ([place])   . Complication of anesthesia    hard to wake up  . Depression   . Diabetes mellitus without complication ([place])   . Dysrhythmia   . ED (erectile dysfunction)   . History of kidney stones   . Hyperlipidemia   . Hypertension   . PONV (postoperative nausea and vomiting)   . Sleep apnea    uses bipap machine  . Spinal headache    reports last spinal headache was yesterday 05-16-18, lasted for 2 hours a, relief with tylenol , denies vision change with yesterdays occurrence but wife endorses vision changes with past spinal headaches    . Type 2 DM with CKD stage 3 and hypertension [facility])     Patient Active Problem List   Diagnosis Date Noted  . Acute renal failure superimposed on stage 3b chronic kidney disease ([place]) 01/08/2020  . Type 2 diabetes mellitus with stage 3 chronic kidney disease ([place]) 01/08/2020  .  Hyperlipidemia associated with type 2 diabetes mellitus ([place]) 01/08/2020  . Renal calculus, bilateral 01/08/2020  . Hydronephrosis of right kidney 01/08/2020  . Renal failure 01/08/2020  . Primary central sleep apnea 12/18/2019  . Sleep apnea treated with nocturnal BiPAP 12/18/2019  . Aftercare 08/13/2018  . Obese 05/23/2018  . S/P left TKA 05/22/2018  . Depression 02/13/2018  . BPH with urinary obstruction 06/25/2013  . Bladder stones 06/25/2013  . Diabetes type 2, uncontrolled ([place]) 08/14/2012  . History of total knee replacement, right 08/01/2011  . DJD (degenerative joint disease) of knee 07/20/2011  . Hyperlipidemia 07/11/2011  . RHINITIS 01/28/2010  . MIGRAINE, CHRONIC 11/28/2008  . ERECTILE DYSFUNCTION, MILD 05/28/2008  . BENIGN PROSTATIC HYPERTROPHY 03/28/2008  . Hypertension associated with diabetes ([place]) 07/09/2007    Past Surgical History:  Procedure Laterality Date  . CARDIAC CATHETERIZATION  06-15-14  . CATARACT EXTRACTION W/ INTRAOCULAR LENS  IMPLANT, BILATERAL  03/13/2003  . COLONOSCOPY    . CYSTOSCOPY W/ URETERAL STENT PLACEMENT Bilateral 01/08/2020   Procedure: CYSTOSCOPY WITH bilateral  RETROGRADE PYELOGRAM/ bilateral URETERAL STENT PLACEMENT;  Surgeon: Bjorn Loser, MD;  [facility];  Service: Urology;  Laterality: Bilateral;  . CYSTOSCOPY WITH LITHOLAPAXY N/A 06/25/2013   Procedure: CYSTOSCOPY WITH LITHOLAPAXY;  Surgeon: [name], MD;  [facility];  Service: Urology;  Laterality: N/A;  .  KNEE ARTHROSCOPY  09/07/2010   left knee  . KNEE ARTHROSCOPY  05/27/2000   left knee  . LEFT HEART CATHETERIZATION WITH CORONARY ANGIOGRAM N/A 06/17/2014   Procedure: LEFT HEART CATHETERIZATION WITH CORONARY ANGIOGRAM;  Surgeon: Laverda Page, MD;  Location: [facility] C[facility] LAB;  Service: Cardiovascular;  Laterality: N/A;  . RETINAL DETACHMENT REPAIR W/ SCLERAL BUCKLE LE  10/14/2002   Dr Zadie Rhine  . TONSILLECTOMY     age 88  . TOTAL KNEE ARTHROPLASTY  08/01/2011     Procedure: TOTAL KNEE ARTHROPLASTY;  Surgeon: Lorn Junes, MD;  Location: [place];  Service: Orthopedics;  Laterality: Right;  TOTAL KNEE ARTHROPLASTY  RIGHT SIDE  . TOTAL KNEE ARTHROPLASTY Left 05/22/2018   Procedure: LEFT TOTAL KNEE ARTHROPLASTY;  Surgeon: Paralee Cancel, MD;  [facility];  Service: Orthopedics;  Laterality: Left;  . TRANSURETHRAL RESECTION OF PROSTATE N/A 06/25/2013   Procedure: TRANSURETHRAL RESECTION OF THE PROSTATE WITH GYRUS INSTRUMENTS;  Surgeon: [name], MD;  [facility];  Service: Urology;  Laterality: N/A;  . VASECTOMY  1982       Family History  Problem Relation Age of Onset  . Hypertension Mother   . Heart failure Mother   . Heart failure Father   . Arthritis Other   . Cancer Other        breast, skin  . Coronary artery disease Other   . Heart disease Other   . Anesthesia problems Neg Hx   . Hypotension Neg Hx   . Malignant hyperthermia Neg Hx   . Pseudochol deficiency Neg Hx     Social History   Tobacco Use  . Smoking status: Never Smoker  . Smokeless tobacco: Never Used  Substance Use Topics  . Alcohol use: Yes    Alcohol/week: 0.0 standard drinks    Comment: rare   . Drug use: No    Home Medications Prior to Admission medications   Medication Sig Start Date End Date Taking? Authorizing Provider  Armodafinil 250 MG tablet Take 250 mg by mouth at bedtime. 01/08/20  Yes [provider]  aspirin EC 81 MG tablet Take 81 mg by mouth daily.   Yes [provider]  atorvastatin (LIPITOR) 20 MG tablet Take 20 mg by mouth daily.   Yes [provider]  carvedilol (COREG) 12.5 MG tablet Take 1 tablet (12.5 mg total) by mouth 2 (two) times daily. 12/23/19 03/22/20 Yes [name], MD  Cholecalciferol (VITAMIN D) 2000 UNITS tablet Take 2,000 Units by mouth daily.   Yes [provider]  escitalopram (LEXAPRO) 20 MG tablet Take 20 mg by mouth daily.  11/13/17  Yes [provider]  finasteride  (PROSCAR) 5 MG tablet Take 5 mg by mouth daily. 01/06/20  Yes [provider]  gabapentin (NEURONTIN) 100 MG capsule Take 1 capsule by mouth daily as needed (nerve pain).  09/20/19  Yes [provider]  magnesium oxide (MAG-OX) 400 MG tablet Take 400 mg by mouth daily.   Yes [provider]  nitroGLYCERIN (NITROSTAT) 0.4 MG SL tablet Place 1 tablet (0.4 mg total) under the tongue every 5 (five) minutes as needed for chest pain. 10/30/19  Yes Miquel Dunn, NP  Omega-3 Fatty Acids (FISH OIL) 1000 MG CAPS Take 1,000 mg by mouth daily.   Yes [provider]  oxybutynin (DITROPAN-XL) 10 MG 24 hr tablet Take 10 mg by mouth at bedtime.  04/05/19  Yes [provider]  Semaglutide (OZEMPIC, 0.25 OR 0.5 MG/DOSE,  [place]) Inject 0.5 mg into the skin once a week. Friday.   Yes [provider]  methocarbamol (ROBAXIN) 500 MG tablet Take 1 tablet (500 mg total) by mouth every 6 (six) hours as needed for muscle spasms. Patient not taking: Reported on 01/08/2020 05/22/18   Danae Orleans, PA-C    Allergies    Penicillins and 5-alpha reductase inhibitors  Review of Systems   Review of Systems  All other systems reviewed and are negative.   Physical Exam Updated Vital Signs BP 127/64   Pulse 63   Temp (!) 97.4 F (36.3 C) (Oral)   Resp 17   Ht 6' (1.829 m)   Wt 115.5 kg   SpO2 97%   BMI 34.53 kg/m   Physical Exam Vitals and nursing note reviewed. Exam conducted with a chaperone present.  Constitutional:      Appearance: Normal appearance.  HENT:     Head: Normocephalic and atraumatic.  Eyes:     General: No scleral icterus.    Conjunctiva/sclera: Conjunctivae normal.  Cardiovascular:     Rate and Rhythm: Normal rate.     Pulses: Normal pulses.     Heart sounds: Normal heart sounds.  Pulmonary:     Effort: Pulmonary effort is normal. No respiratory distress.     Breath sounds: Normal breath sounds. No wheezing.  Abdominal:     General:  Abdomen is flat. There is no distension.     Palpations: Abdomen is soft.     Tenderness: There is no abdominal tenderness. There is no guarding.  Musculoskeletal:     Comments: Bilateral flank TTP.  No significant midline TTP.  Skin:    General: Skin is dry.  Neurological:     Mental Status: He is alert.     GCS: GCS eye subscore is 4. GCS verbal subscore is 5. GCS motor subscore is 6.  Psychiatric:        Mood and Affect: Mood normal.        Behavior: Behavior normal.        Thought Content: Thought content normal.     ED Results / Procedures / Treatments   Labs (all labs ordered are listed, but only abnormal results are displayed) Labs Reviewed  URINE CULTURE - Abnormal; Notable for the following components:      Result Value   Culture   (*)    Value: <10,000 COLONIES/mL INSIGNIFICANT GROWTH Performed at [facility], [address], [place], [place] [address]    All other components within normal limits  CBC WITH DIFFERENTIAL/PLATELET - Abnormal; Notable for the following components:   Lymphs Abs 0.6 (*)    Abs Immature Granulocytes 0.08 (*)    All other components within normal limits  COMPREHENSIVE METABOLIC PANEL - Abnormal; Notable for the following components:   Sodium 131 (*)    CO2 19 (*)    Glucose, Bld 125 (*)    BUN 73 (*)    Creatinine, Ser 10.68 (*)    Calcium 8.1 (*)    Albumin 3.4 (*)    AST 14 (*)    GFR calc non Af Amer 4 (*)    GFR calc Af Amer 5 (*)    All other components within normal limits  BASIC METABOLIC PANEL - Abnormal; Notable for the following components:   Sodium 133 (*)    CO2 15 (*)    Glucose, Bld 126 (*)    BUN 67 (*)    Creatinine, Ser 7.02 (*)  Calcium 8.3 (*)    GFR calc non Af Amer 7 (*)    GFR calc Af Amer 8 (*)    All other components within normal limits  CBC - Abnormal; Notable for the following components:   WBC 11.4 (*)    All other components within normal limits  HEMOGLOBIN A1C - Abnormal; Notable for  the following components:   Hgb A1c MFr Bld 7.4 (*)    All other components within normal limits  GLUCOSE, CAPILLARY - Abnormal; Notable for the following components:   Glucose-Capillary 114 (*)    All other components within normal limits  GLUCOSE, CAPILLARY - Abnormal; Notable for the following components:   Glucose-Capillary 133 (*)    All other components within normal limits  GLUCOSE, CAPILLARY - Abnormal; Notable for the following components:   Glucose-Capillary 128 (*)    All other components within normal limits  GLUCOSE, CAPILLARY - Abnormal; Notable for the following components:   Glucose-Capillary 119 (*)    All other components within normal limits  GLUCOSE, CAPILLARY - Abnormal; Notable for the following components:   Glucose-Capillary 107 (*)    All other components within normal limits  GLUCOSE, CAPILLARY - Abnormal; Notable for the following components:   Glucose-Capillary 104 (*)    All other components within normal limits  GLUCOSE, CAPILLARY - Abnormal; Notable for the following components:   Glucose-Capillary 106 (*)    All other components within normal limits  CBC - Abnormal; Notable for the following components:   Platelets 149 (*)    All other components within normal limits  BASIC METABOLIC PANEL - Abnormal; Notable for the following components:   CO2 19 (*)    Glucose, Bld 154 (*)    BUN 58 (*)    Creatinine, Ser 4.10 (*)    Calcium 8.4 (*)    GFR calc non Af Amer 14 (*)    GFR calc Af Amer 16 (*)    All other components within normal limits  GLUCOSE, CAPILLARY - Abnormal; Notable for the following components:   Glucose-Capillary 168 (*)    All other components within normal limits  GLUCOSE, CAPILLARY - Abnormal; Notable for the following components:   Glucose-Capillary 138 (*)    All other components within normal limits  GLUCOSE, CAPILLARY - Abnormal; Notable for the following components:   Glucose-Capillary 178 (*)    All other components  within normal limits  GLUCOSE, CAPILLARY - Abnormal; Notable for the following components:   Glucose-Capillary 145 (*)    All other components within normal limits  BASIC METABOLIC PANEL - Abnormal; Notable for the following components:   Glucose, Bld 176 (*)    BUN 54 (*)    Creatinine, Ser 2.97 (*)    GFR calc non Af Amer 20 (*)    GFR calc Af Amer 23 (*)    All other components within normal limits  GLUCOSE, CAPILLARY - Abnormal; Notable for the following components:   Glucose-Capillary 115 (*)    All other components within normal limits  RESPIRATORY PANEL BY RT PCR (FLU A&B, COVID)  CULTURE, BLOOD (ROUTINE X 2)  CULTURE, BLOOD (ROUTINE X 2)  GLUCOSE, CAPILLARY    EKG EKG Interpretation  Date/Time:  Wednesday January 08 2020 15:46:03 EDT Ventricular Rate:  57 PR Interval:    QRS Duration: 136 QT Interval:  480 QTC Calculation: 468 R Axis:   -39 Text Interpretation: Sinus rhythm Prolonged PR interval Left bundle branch block in a pattern of  bigeminy RESOLVED SINCE PREVIOUS Confirmed by Blanchie Dessert [phone]) on 01/09/2020 9:28:36 PM   Radiology No results found.  Procedures Procedures (including critical care time)  Medications Ordered in ED Medications  acetaminophen (OFIRMEV) 10 MG/ML IV (has no administration in time range)  ciprofloxacin (CIPRO) 400 MG/200ML IVPB (  Override pull for Anesthesia 01/08/20 2047)  acetaminophen (OFIRMEV) IV 1,000 mg (1,000 mg Intravenous New Bag/Given 01/08/20 2143)    ED Course  I have reviewed the triage vital signs and the nursing notes.  Pertinent labs & imaging results that were available during my care of the patient were reviewed by me and considered in my medical decision making (see chart for details).  Clinical Course as of Jan 23 1639  Wed Jan 08, 2020  1754 Spoke with Dr. Hollie Salk from [facility] and since his left kidney is atrophic and he has an obstructing stone in right ureter with mild hydronephrosis, this  should be treated as obstructing stone in patient with solitary kidney.  Recommending that I contact urology for consult and intervention.   [GG]  9449 Consulted Dr. Matilde Sprang with [facility] who will prep the OR and see patient immediately for surgical intervention.   [GG]    Clinical Course User Index [GG] Corena Herter, PA-C   MDM Rules/Calculators/A&P                      CT renal stone study demonstrates a 5 mm obstructing right-sided ureterolithiasis with associated hydronephrosis.  Left kidney appears atrophic on imaging.  Labs are notable for creatinine 10.68, elevated from 1.97 previously on labs obtained 05/23/2018.  Covid testing obtained.  Patient kept n.p.o. and he is receiving maintenance fluids, antiemetics, and analgesics.  Consult with Dr. Hollie Salk from [facility] who stated that patient will require urology consult and surgical intervention for obstructing kidney stone in patient with solitary kidney given atrophy.  Spoke with Dr. Matilde Sprang with [facility] who will prep the OR and see patient immediately for surgical invention.  Patient admitted by hospitalist, Dr. Posey Pronto, with plans for cystoscopy and retrograde pyelogram with ureteral stent placement bilaterally.  EDIT: This had to be rewritten and is retroactive to 01/08/2020.  Fortunately the ED course timestamp information that I had created in my prior note repopulated in this note.   Final Clinical Impression(s) / ED Diagnoses Final diagnoses:  Ureterolithiasis  Acute renal failure, unspecified acute renal failure type [facility])    Rx / DC Orders ED Discharge Orders         Ordered    Increase activity slowly     01/11/20 1002           Reita Chard 01/23/20 1641    Lucrezia Starch, MD 01/25/20 [phone]

## 2020-01-24 DIAGNOSIS — N183 Chronic kidney disease, stage 3 unspecified: Secondary | ICD-10-CM | POA: Diagnosis not present

## 2020-01-24 DIAGNOSIS — I129 Hypertensive chronic kidney disease with stage 1 through stage 4 chronic kidney disease, or unspecified chronic kidney disease: Secondary | ICD-10-CM | POA: Diagnosis not present

## 2020-01-24 DIAGNOSIS — N200 Calculus of kidney: Secondary | ICD-10-CM | POA: Diagnosis not present

## 2020-01-24 DIAGNOSIS — N179 Acute kidney failure, unspecified: Secondary | ICD-10-CM | POA: Diagnosis not present

## 2020-01-27 ENCOUNTER — Other Ambulatory Visit (HOSPITAL_COMMUNITY)
Admission: RE | Admit: 2020-01-27 | Discharge: 2020-01-27 | Disposition: A | Discharge status: Home / Self Care | Payer: Medicare Other | Source: Ambulatory Visit | Attending: Urology | Admitting: Urology

## 2020-01-27 DIAGNOSIS — Z01812 Encounter for preprocedural laboratory examination: Secondary | ICD-10-CM | POA: Diagnosis not present

## 2020-01-27 DIAGNOSIS — Z20822 Contact with and (suspected) exposure to covid-19: Secondary | ICD-10-CM | POA: Insufficient documentation

## 2020-01-27 LAB — SARS CORONAVIRUS 2 (TAT 6-24 HRS): SARS Coronavirus 2: NEGATIVE

## 2020-01-27 NOTE — Progress Notes (Signed)
Anesthesia Chart Review   Case: [id number] Date/Time: 01/30/20 1115   Procedure: CYSTOSCOPY BILATERAL URETEROSCOPY/HOLMIUM LASER/STENT EXCHANGE (Bilateral )   Anesthesia type: General   Pre-op diagnosis: BILATERAL URETERAL STONES   Location: [facility] [facility]   Surgeons: [name], MD      DISCUSSION:74 y.o. never smoker with h/o PONV, HTN, DM II, sleep apnea w/bipap, CHF (Echo 1/21 EF 40-45%), CKD Stage III, BPH, bilateral ureteral stones scheduled for above procedure 01/30/2020 with Dr. [name].    Pt last seen by cardiologist, Dr. [name], 12/23/2019.    S/p cystoscopy 01/08/2020 with no anesthesia complications.    VS: BP (!) 165/75   Pulse 71   Temp 36.4 C (Oral)   Resp 18   Ht 6\' 1"  (1.854 m)   Wt 121.6 kg   SpO2 100%   BMI 35.38 kg/m   PROVIDERS: Leanna Battles, MD is PCP   [name], MD is Cardiologist  LABS: Labs reviewed: Acceptable for surgery. (all labs ordered are listed, but only abnormal results are displayed)  Labs Reviewed  BASIC METABOLIC PANEL - Abnormal; Notable for the following components:      Result Value   Glucose, Bld 191 (*)    BUN 31 (*)    Creatinine, Ser 2.13 (*)    GFR calc non Af Amer 30 (*)    GFR calc Af Amer 35 (*)    All other components within normal limits  CBC - Abnormal; Notable for the following components:   RBC 4.07 (*)    Hemoglobin 12.3 (*)    HCT 38.7 (*)    All other components within normal limits     IMAGES:   EKG: 01/09/2020 Rate 57 bpm Sinus rhythm  Prolonged PR interval  Left bundle branch block   CV: Lexiscan (Walking with mod Bruce)Tetrofosmin Stress Test  10/09/2019: Nondiagnostic ECG stress. Resting EKG/ECG demonstrated normal sinus rhythm. Left ventricular strain patterns present.  Peak EKG/ECG revealed no significant ST-T change from baseline abnormality. There is a fixed moderate defect in the inferior and apical regions consistent with scar versus diaphragmatic  attenuation. Global hypokinesis with stress LV EF: 35%. LV is mildly dilated in both rest and stress images.  No significant change from 04/21/2014.  Intermediate risk study due to low LVEF.   Echocardiogram 09/25/2019:  Left ventricle cavity is normal in size. Moderate concentric hypertrophy  of the left ventricle. Hypokinetic global wall motion. Doppler evidence of  grade I (impaired) diastolic dysfunction, normal LAP. Mildly depressed LV  systolic function with visual EF 40-45%.  Left atrial cavity is moderately dilated at 4.9 cm.  Trileaflet aortic valve. Mild (Grade I) aortic regurgitation.  Compared to 09/20/2017, previously LVEF was normal around 55%. AV  regurgitation new.  Past Medical History:  Diagnosis Date  . Anginal pain ([place])   . Anxiety    takes Celexa  . Arthritis    bil knees  . Benign prostatic hypertrophy    takes Flomax daily  . Bladder stones 06/25/2013   1.9cm bladder stone.  Marland Kitchen BPH with urinary obstruction 06/25/2013   127cc prostate with large middle lobe and bladder stones.   . Cardiomyopathy ([place])   . CHF (congestive heart failure) ([place])   . Complication of anesthesia    hard to wake up  . Depression   . Diabetes mellitus without complication ([place])   . Dysrhythmia   . ED (erectile dysfunction)   . History of kidney stones   . Hyperlipidemia   .  Hypertension   . PONV (postoperative nausea and vomiting)   . Sleep apnea    uses bipap machine  . Spinal headache    reports last spinal headache was yesterday 05-16-18, lasted for 2 hours a, relief with tylenol , denies vision change with yesterdays occurrence but wife endorses vision changes with past spinal headaches    . Type 2 DM with CKD stage 3 and hypertension ([place])     Past Surgical History:  Procedure Laterality Date  . CARDIAC CATHETERIZATION  06-15-14  . CATARACT EXTRACTION W/ INTRAOCULAR LENS  IMPLANT, BILATERAL  03/13/2003  . COLONOSCOPY    . CYSTOSCOPY W/ URETERAL STENT PLACEMENT  Bilateral 01/08/2020   Procedure: CYSTOSCOPY WITH bilateral  RETROGRADE PYELOGRAM/ bilateral URETERAL STENT PLACEMENT;  Surgeon: Bjorn Loser, MD;  [facility];  Service: Urology;  Laterality: Bilateral;  . CYSTOSCOPY WITH LITHOLAPAXY N/A 06/25/2013   Procedure: CYSTOSCOPY WITH LITHOLAPAXY;  Surgeon: [name], MD;  [facility];  Service: Urology;  Laterality: N/A;  . KNEE ARTHROSCOPY  09/07/2010   left knee  . KNEE ARTHROSCOPY  05/27/2000   left knee  . LEFT HEART CATHETERIZATION WITH CORONARY ANGIOGRAM N/A 06/17/2014   Procedure: LEFT HEART CATHETERIZATION WITH CORONARY ANGIOGRAM;  Surgeon: Laverda Page, MD;  [facility] C[facility] LAB;  Service: Cardiovascular;  Laterality: N/A;  . RETINAL DETACHMENT REPAIR W/ SCLERAL BUCKLE LE  10/14/2002   Dr Zadie Rhine  . TONSILLECTOMY     age 20  . TOTAL KNEE ARTHROPLASTY  08/01/2011   Procedure: TOTAL KNEE ARTHROPLASTY;  Surgeon: Lorn Junes, MD;  Location: [place];  Service: Orthopedics;  Laterality: Right;  TOTAL KNEE ARTHROPLASTY  RIGHT SIDE  . TOTAL KNEE ARTHROPLASTY Left 05/22/2018   Procedure: LEFT TOTAL KNEE ARTHROPLASTY;  Surgeon: Paralee Cancel, MD;  [facility];  Service: Orthopedics;  Laterality: Left;  . TRANSURETHRAL RESECTION OF PROSTATE N/A 06/25/2013   Procedure: TRANSURETHRAL RESECTION OF THE PROSTATE WITH GYRUS INSTRUMENTS;  Surgeon: [name], MD;  [facility];  Service: Urology;  Laterality: N/A;  . VASECTOMY  1982    MEDICATIONS: . Armodafinil 250 MG tablet  . aspirin EC 81 MG tablet  . atorvastatin (LIPITOR) 20 MG tablet  . carvedilol (COREG) 12.5 MG tablet  . Cholecalciferol (VITAMIN D) 2000 UNITS tablet  . escitalopram (LEXAPRO) 20 MG tablet  . finasteride (PROSCAR) 5 MG tablet  . gabapentin (NEURONTIN) 100 MG capsule  . magnesium oxide (MAG-OX) 400 MG tablet  . methocarbamol (ROBAXIN) 500 MG tablet  . nitroGLYCERIN (NITROSTAT) 0.4 MG SL tablet  . Omega-3 Fatty Acids (FISH OIL) 1000 MG CAPS  .  oxybutynin (DITROPAN-XL) 10 MG 24 hr tablet  . Semaglutide (OZEMPIC, 0.25 OR 0.5 MG/DOSE, [place])   No current facility-administered medications for this encounter.    Maia Plan [facility] Pre-Surgical Testing [phone] 01/27/20  12:54 PM

## 2020-01-29 NOTE — Progress Notes (Signed)
Pt. Aware of time change and will arrive at admitting at [place] am .

## 2020-01-29 NOTE — Anesthesia Preprocedure Evaluation (Addendum)
Anesthesia Evaluation  Patient identified by MRN, date of birth, ID band Patient awake    Reviewed: Allergy & Precautions, NPO status , Patient's Chart, lab work & pertinent test results  Airway Mallampati: II  TM Distance: >3 FB Neck ROM: Full    Dental no notable dental hx. (+) Teeth Intact, Dental Advisory Given   Pulmonary sleep apnea and Continuous Positive Airway Pressure Ventilation ,    Pulmonary exam normal breath sounds clear to auscultation       Cardiovascular Exercise Tolerance: Good hypertension, Pt. on medications and Pt. on home beta blockers + angina Normal cardiovascular exam Rhythm:Regular Rate:Normal     Neuro/Psych  Headaches, Anxiety negative psych ROS   GI/Hepatic negative GI ROS, Neg liver ROS,   Endo/Other  negative endocrine ROSdiabetes, Type 2, Oral Hypoglycemic Agents  Renal/GU CRFRenal diseaseK+ 4.7 Cr 2.13     Musculoskeletal negative musculoskeletal ROS (+) Arthritis ,   Abdominal   Peds  Hematology negative hematology ROS (+) Hgb 12.3   Anesthesia Other Findings   Reproductive/Obstetrics                           Anesthesia Physical Anesthesia Plan  ASA: IV  Anesthesia Plan: General   Post-op Pain Management:    Induction: Intravenous and Inhalational  PONV Risk Score and Plan: 3 and Treatment may vary due to age or medical condition, Ondansetron and Dexamethasone  Airway Management Planned: LMA  Additional Equipment: None  Intra-op Plan:   Post-operative Plan:   Informed Consent: I have reviewed the patients History and Physical, chart, labs and discussed the procedure including the risks, benefits and alternatives for the proposed anesthesia with the patient or authorized representative who has indicated his/her understanding and acceptance.     Dental advisory given  Plan Discussed with: CRNA and Anesthesiologist  Anesthesia Plan  Comments:        Anesthesia Quick Evaluation

## 2020-01-30 ENCOUNTER — Encounter (HOSPITAL_COMMUNITY)
Admission: RE | Disposition: A | Discharge status: Home / Self Care | Payer: Self-pay | Source: Home / Self Care | Attending: Urology

## 2020-01-30 ENCOUNTER — Ambulatory Visit (HOSPITAL_COMMUNITY): Payer: Medicare Other | Admitting: Anesthesiology

## 2020-01-30 ENCOUNTER — Ambulatory Visit (HOSPITAL_COMMUNITY)
Admission: RE | Admit: 2020-01-30 | Discharge: 2020-01-30 | Disposition: A | Discharge status: Home / Self Care | Payer: Medicare Other | Attending: Urology | Admitting: Urology

## 2020-01-30 ENCOUNTER — Encounter (HOSPITAL_COMMUNITY): Payer: Self-pay | Admitting: Urology

## 2020-01-30 ENCOUNTER — Other Ambulatory Visit: Payer: Self-pay

## 2020-01-30 ENCOUNTER — Telehealth (HOSPITAL_COMMUNITY): Payer: Self-pay | Admitting: *Deleted

## 2020-01-30 ENCOUNTER — Ambulatory Visit (HOSPITAL_COMMUNITY): Payer: Medicare Other | Admitting: Physician Assistant

## 2020-01-30 ENCOUNTER — Ambulatory Visit (HOSPITAL_COMMUNITY): Payer: Medicare Other

## 2020-01-30 DIAGNOSIS — N1832 Chronic kidney disease, stage 3b: Secondary | ICD-10-CM | POA: Diagnosis not present

## 2020-01-30 DIAGNOSIS — I129 Hypertensive chronic kidney disease with stage 1 through stage 4 chronic kidney disease, or unspecified chronic kidney disease: Secondary | ICD-10-CM | POA: Diagnosis not present

## 2020-01-30 DIAGNOSIS — N202 Calculus of kidney with calculus of ureter: Secondary | ICD-10-CM | POA: Diagnosis not present

## 2020-01-30 DIAGNOSIS — Z8261 Family history of arthritis: Secondary | ICD-10-CM | POA: Insufficient documentation

## 2020-01-30 DIAGNOSIS — Z888 Allergy status to other drugs, medicaments and biological substances status: Secondary | ICD-10-CM | POA: Diagnosis not present

## 2020-01-30 DIAGNOSIS — F419 Anxiety disorder, unspecified: Secondary | ICD-10-CM | POA: Diagnosis not present

## 2020-01-30 DIAGNOSIS — Z96653 Presence of artificial knee joint, bilateral: Secondary | ICD-10-CM | POA: Insufficient documentation

## 2020-01-30 DIAGNOSIS — Z880 Allergy status to penicillin: Secondary | ICD-10-CM | POA: Diagnosis not present

## 2020-01-30 DIAGNOSIS — E785 Hyperlipidemia, unspecified: Secondary | ICD-10-CM | POA: Diagnosis not present

## 2020-01-30 DIAGNOSIS — M1990 Unspecified osteoarthritis, unspecified site: Secondary | ICD-10-CM | POA: Insufficient documentation

## 2020-01-30 DIAGNOSIS — E1122 Type 2 diabetes mellitus with diabetic chronic kidney disease: Secondary | ICD-10-CM | POA: Diagnosis not present

## 2020-01-30 DIAGNOSIS — Z9852 Vasectomy status: Secondary | ICD-10-CM | POA: Insufficient documentation

## 2020-01-30 DIAGNOSIS — Z7984 Long term (current) use of oral hypoglycemic drugs: Secondary | ICD-10-CM | POA: Diagnosis not present

## 2020-01-30 DIAGNOSIS — N201 Calculus of ureter: Secondary | ICD-10-CM | POA: Diagnosis not present

## 2020-01-30 DIAGNOSIS — N183 Chronic kidney disease, stage 3 unspecified: Secondary | ICD-10-CM | POA: Insufficient documentation

## 2020-01-30 DIAGNOSIS — N179 Acute kidney failure, unspecified: Secondary | ICD-10-CM | POA: Diagnosis not present

## 2020-01-30 DIAGNOSIS — Z8249 Family history of ischemic heart disease and other diseases of the circulatory system: Secondary | ICD-10-CM | POA: Insufficient documentation

## 2020-01-30 DIAGNOSIS — G4730 Sleep apnea, unspecified: Secondary | ICD-10-CM | POA: Insufficient documentation

## 2020-01-30 DIAGNOSIS — N132 Hydronephrosis with renal and ureteral calculous obstruction: Secondary | ICD-10-CM | POA: Diagnosis not present

## 2020-01-30 HISTORY — PX: CYSTOSCOPY/URETEROSCOPY/HOLMIUM LASER/STENT PLACEMENT: SHX6546

## 2020-01-30 LAB — GLUCOSE, CAPILLARY: Glucose-Capillary: 146 mg/dL — ABNORMAL HIGH (ref 70–99)

## 2020-01-30 SURGERY — CYSTOSCOPY/URETEROSCOPY/HOLMIUM LASER/STENT PLACEMENT
Anesthesia: General | Site: Ureter | Laterality: Bilateral

## 2020-01-30 MED ORDER — LIDOCAINE 2% (20 MG/ML) 5 ML SYRINGE
INTRAMUSCULAR | Status: AC
  Filled 2020-01-30: qty 5

## 2020-01-30 MED ORDER — ACETAMINOPHEN 10 MG/ML IV SOLN: 1000.0000 mg | Freq: Once | INTRAVENOUS | Status: DC | PRN

## 2020-01-30 MED ORDER — CIPROFLOXACIN HCL 250 MG PO TABS: 250.0000 mg | ORAL_TABLET | Freq: Two times a day (BID) | ORAL | 0 refills | Status: AC

## 2020-01-30 MED ORDER — DEXAMETHASONE SODIUM PHOSPHATE 10 MG/ML IJ SOLN
INTRAMUSCULAR | Status: AC
  Filled 2020-01-30: qty 1

## 2020-01-30 MED ORDER — FENTANYL CITRATE (PF) 100 MCG/2ML IJ SOLN
INTRAMUSCULAR | Status: AC
  Filled 2020-01-30: qty 2

## 2020-01-30 MED ORDER — EPHEDRINE 5 MG/ML INJ
INTRAVENOUS | Status: AC
  Filled 2020-01-30: qty 10

## 2020-01-30 MED ORDER — ONDANSETRON HCL 4 MG/2ML IJ SOLN
INTRAMUSCULAR | Status: AC
  Filled 2020-01-30: qty 2

## 2020-01-30 MED ORDER — SODIUM CHLORIDE 0.9% FLUSH: 3.0000 mL | Freq: Two times a day (BID) | INTRAVENOUS | Status: DC

## 2020-01-30 MED ORDER — ONDANSETRON HCL 4 MG/2ML IJ SOLN
INTRAMUSCULAR | Status: DC | PRN
  Administered 2020-01-30: 4 mg via INTRAVENOUS

## 2020-01-30 MED ORDER — DEXAMETHASONE SODIUM PHOSPHATE 10 MG/ML IJ SOLN
INTRAMUSCULAR | Status: DC | PRN
  Administered 2020-01-30: 4 mg via INTRAVENOUS

## 2020-01-30 MED ORDER — HYDROCODONE-ACETAMINOPHEN 5-325 MG PO TABS: 1.0000 | ORAL_TABLET | Freq: Four times a day (QID) | ORAL | 0 refills | Status: DC | PRN

## 2020-01-30 MED ORDER — ONDANSETRON HCL 4 MG/2ML IJ SOLN: 4.0000 mg | Freq: Once | INTRAMUSCULAR | Status: DC | PRN

## 2020-01-30 MED ORDER — FLUCONAZOLE 100MG IVPB
100.0000 mg | Freq: Once | INTRAVENOUS | Status: AC
  Administered 2020-01-30: 100 mg via INTRAVENOUS
  Filled 2020-01-30: qty 50

## 2020-01-30 MED ORDER — SODIUM CHLORIDE 0.9 % IR SOLN
Status: DC | PRN
  Administered 2020-01-30: 6000 mL

## 2020-01-30 MED ORDER — SODIUM CHLORIDE 0.9 % IV SOLN: INTRAVENOUS | Status: DC

## 2020-01-30 MED ORDER — LACTATED RINGERS IV SOLN: INTRAVENOUS | Status: DC | PRN

## 2020-01-30 MED ORDER — EPHEDRINE SULFATE-NACL 50-0.9 MG/10ML-% IV SOSY
PREFILLED_SYRINGE | INTRAVENOUS | Status: DC | PRN
  Administered 2020-01-30 (×2): 5 mg via INTRAVENOUS

## 2020-01-30 MED ORDER — FENTANYL CITRATE (PF) 100 MCG/2ML IJ SOLN: 25.0000 ug | INTRAMUSCULAR | Status: DC | PRN

## 2020-01-30 MED ORDER — CIPROFLOXACIN IN D5W 400 MG/200ML IV SOLN
400.0000 mg | INTRAVENOUS | Status: AC
  Administered 2020-01-30: 400 mg via INTRAVENOUS
  Filled 2020-01-30: qty 200

## 2020-01-30 MED ORDER — PROPOFOL 10 MG/ML IV BOLUS
INTRAVENOUS | Status: AC
  Filled 2020-01-30: qty 40

## 2020-01-30 MED ORDER — PROPOFOL 10 MG/ML IV BOLUS
INTRAVENOUS | Status: DC | PRN
  Administered 2020-01-30: 140 mg via INTRAVENOUS

## 2020-01-30 MED ORDER — LIDOCAINE 2% (20 MG/ML) 5 ML SYRINGE
INTRAMUSCULAR | Status: DC | PRN
  Administered 2020-01-30: 100 mg via INTRAVENOUS

## 2020-01-30 MED ORDER — FENTANYL CITRATE (PF) 100 MCG/2ML IJ SOLN
INTRAMUSCULAR | Status: DC | PRN
  Administered 2020-01-30: 25 ug via INTRAVENOUS
  Administered 2020-01-30: 50 ug via INTRAVENOUS
  Administered 2020-01-30: 25 ug via INTRAVENOUS

## 2020-01-30 SURGICAL SUPPLY — 22 items
BAG URO CATCHER STRL LF (MISCELLANEOUS) ×2 IMPLANT
BASKET STONE NCOMPASS (UROLOGICAL SUPPLIES) ×2 IMPLANT
CATH URET 5FR 28IN OPEN ENDED (CATHETERS) ×2 IMPLANT
CATH URET DUAL LUMEN 6-10FR 50 (CATHETERS) IMPLANT
CLOTH BEACON ORANGE TIMEOUT ST (SAFETY) ×2 IMPLANT
EXTRACTOR STONE NITINOL NGAGE (UROLOGICAL SUPPLIES) ×2 IMPLANT
FIBER LASER FLEXIVA 1000 (UROLOGICAL SUPPLIES) IMPLANT
FIBER LASER FLEXIVA 365 (UROLOGICAL SUPPLIES) IMPLANT
FIBER LASER FLEXIVA 550 (UROLOGICAL SUPPLIES) IMPLANT
FIBER LASER TRAC TIP (UROLOGICAL SUPPLIES) IMPLANT
GLOVE SURG SS PI 8.0 STRL IVOR (GLOVE) ×2 IMPLANT
GOWN STRL REUS W/TWL XL LVL3 (GOWN DISPOSABLE) ×2 IMPLANT
GUIDEWIRE STR DUAL SENSOR (WIRE) ×3 IMPLANT
IV NS IRRIG 3000ML ARTHROMATIC (IV SOLUTION) ×2 IMPLANT
KIT TURNOVER KIT A (KITS) ×2 IMPLANT
MANIFOLD NEPTUNE II (INSTRUMENTS) ×2 IMPLANT
SHEATH URETERAL 12FRX35CM (MISCELLANEOUS) IMPLANT
SHEATH URETERAL 12FRX55CM (UROLOGICAL SUPPLIES) ×1 IMPLANT
STENT URET 6FRX24 CONTOUR (STENTS) ×4 IMPLANT
TRAY CYSTO PACK (CUSTOM PROCEDURE TRAY) ×2 IMPLANT
TUBING CONNECTING 10 (TUBING) ×2 IMPLANT
TUBING UROLOGY SET (TUBING) ×2 IMPLANT

## 2020-01-30 NOTE — Anesthesia Procedure Notes (Signed)
Procedure Name: LMA Insertion Date/Time: 01/30/2020 11:50 AM Performed by: Eben Burow, CRNA Pre-anesthesia Checklist: Patient identified, Emergency Drugs available, Suction available, Patient being monitored and Timeout performed Patient Re-evaluated:Patient Re-evaluated prior to induction Oxygen Delivery Method: Circle system utilized Preoxygenation: Pre-oxygenation with 100% oxygen Induction Type: IV induction Ventilation: Mask ventilation without difficulty LMA: LMA inserted and LMA with gastric port inserted LMA Size: 5.0 Tube secured with: Tape Dental Injury: Teeth and Oropharynx as per pre-operative assessment

## 2020-01-30 NOTE — Transfer of Care (Signed)
Immediate Anesthesia Transfer of Care Note  Patient: Keith [name].  Procedure(s) Performed: CYSTOSCOPY BILATERAL URETEROSCOPY WITH STENT EXCHANGE (Bilateral Ureter)  Patient Location: PACU  Anesthesia Type:General  Level of Consciousness: drowsy and patient cooperative  Airway & Oxygen Therapy: Patient Spontanous Breathing and Patient connected to face mask oxygen  Post-op Assessment: Report given to RN and Post -op Vital signs reviewed and stable  Post vital signs: Reviewed and stable  Last Vitals:  Vitals Value Taken Time  BP 153/81 01/30/20 1247  Temp    Pulse 70 01/30/20 1248  Resp    SpO2 100 % 01/30/20 1248  Vitals shown include unvalidated device data.  Last Pain:  Vitals:   01/30/20 0911  TempSrc: Oral  PainSc: 0-No pain      Patients Stated Pain Goal: 4 (97/28/20 6015)  Complications: No apparent anesthesia complications

## 2020-01-30 NOTE — Op Note (Signed)
Procedure: 1.  Cystoscopy with removal of bilateral double-J stents. 2.  Bilateral ureteroscopic stone extraction. 3.  Cystoscopy with insertion of bilateral double-J stents. 4.  Fluoroscopy application.  Preop diagnosis: Bilateral ureteral stones.  Postop diagnosis: Left distal ureteral stones and right renal stone.  Surgeon: Dr. [name].  Anesthesia: General.  Specimen: Right renal stone.  Drains: Bilateral 6 French by 24 cm contour double-J stents with tether's.  EBL: None.  Complications: None.  Indications: The patient is a 74 year old male who was admitted to [place] sepsis approximately 3 weeks ago and was found to have an obstructing right proximal ureteral stone and nonobstructing left distal ureteral stones.  He underwent placement of bilateral stents and returns now for ureteroscopic management.  He had a urine culture done on the 13th that grew Enterococcus sensitive to Cipro which was started on the 17th of this month.  Procedure: He was taken operating room where he was given Cipro 4 mg IV.  A general anesthetic was induced.  He was placed in lithotomy position and fitted with PAS hose.  His perineum and genitalia were prepped with Betadine solution he was draped in usual sterile fashion.  Cystoscopy was performed using a 23 [place] scope and a 30 degree lens.  Examination revealed a normal urethra.  The external sphincter was intact.  The prostatic urethra had evidence of prior TURP with a patent defect.  Stents were noted at both ureteral orifices.  There was some calcific debris in the base the bladder and the stents were somewhat encrusted.  The bladder wall had mild inflammatory changes and mild trabeculation but no tumors were noted.  The bladder was evacuated free of the debris.  The left ureteral stent was grasped with a grasping forceps and pulled the urethral meatus.  A sensor wire was advanced through the stent to the kidney and the stent was removed.  The 6.5 French  semirigid ureteroscope was then advanced alongside the wire into the ureter and several small eggshell type calcifications were identified in the area they have been noted on CT scan.  These calcifications were removed using the engage basket and further inspection into the proximal ureter revealed no additional stones.  The ureteroscope was removed and the wire was left in place.  The cystoscope was then reinserted and the right ureteral stent was grasped and pulled the urethral meatus.  A sensor wire was advanced to the kidney and the stent was removed.  A 55 cm 12/14 digital access sheath was then advanced over the wire and easily passed to the kidney.  The wire and inner core were removed.  The dual-lumen digital flexible ureteroscope was then advanced the kidney and the collecting system was inspected.  The single stone which was confirmed on on CT review during the procedure was located in the lower pole of the kidney in the medial calyx.  The stone was grasped with an engage basket and I was able to remove it intact along with the ureteroscope and sheath while inspecting ureteral lumen doing with withdrawal.  The cystoscope was then reinserted and a sensor guidewire was reinserted to the right kidney under fluoroscopic guidance with the aid of an open-ended catheter.  A 6 French by 24 cm contour double-J stent was then advanced over the wire under fluoroscopic guidance.  The wire was removed, leaving a good coil in the kidney and a good coil in the bladder.  The cystoscope was removed leaving the stent string exiting urethra.  An  extra knot was placed in the midpoint of the string to identify this is the right ureteral stent.  The cystoscope was then reinserted over the wire to the left kidney and a second 6 [place] by 24 cm contour double-J stent was advanced to the kidney under fluoroscopic guidance.  The wire was removed, leaving a good coil in the kidney and a good coil in the bladder.  The bladder  was then drained and the cystoscope was removed.  Final fluoroscopy revealed the stents in good position and the tether's were both appropriately placed and secured to the patient's penis.  The stone from the right kidney was placed in a container will be given to the patient's wife to bring to the office for analysis.  The stones from the left ureter were too fragile to collect.  He was taken down from lithotomy position, his anesthetic was reversed and he was moved to recovery in stable condition.  There were no complications.  He will be given a dose of fluconazole 100 mg since he has been on recent antibiotics and because of the debris in the bladder which appeared to be more stent encrustation but I felt the fluconazole would be appropriate.

## 2020-01-30 NOTE — Interval H&P Note (Signed)
History and Physical Interval Note:  He has been stented and is to have definitive therapy today.  01/30/2020 11:37 AM  [name]  has presented today for surgery, with the diagnosis of BILATERAL URETERAL STONES.  The various methods of treatment have been discussed with the patient and family. After consideration of risks, benefits and other options for treatment, the patient has consented to  Procedure(s): CYSTOSCOPY BILATERAL URETEROSCOPY/HOLMIUM LASER/STENT EXCHANGE (Bilateral) as a surgical intervention.  The patient's history has been reviewed, patient examined, no change in status, stable for surgery.  I have reviewed the patient's chart and labs.  Questions were answered to the patient's satisfaction.     [name]

## 2020-01-30 NOTE — Discharge Instructions (Signed)
Ureteral Stent Implantation, Care After This sheet gives you information about how to care for yourself after your procedure. Your health care provider may also give you more specific instructions. If you have problems or questions, contact your health care provider. What can I expect after the procedure? After the procedure, it is common to have:  Nausea.  Mild pain when you urinate. You may feel this pain in your lower back or lower abdomen. The pain should stop within a few minutes after you urinate. This may last for up to 1 week.  A small amount of blood in your urine for several days. Follow these instructions at home: Medicines  Take over-the-counter and prescription medicines only as told by your health care provider.  If you were prescribed an antibiotic medicine, take it as told by your health care provider. Do not stop taking the antibiotic even if you start to feel better.  Do not drive for 24 hours if you were given a sedative during your procedure.  Ask your health care provider if the medicine prescribed to you requires you to avoid driving or using heavy machinery. Activity  Rest as told by your health care provider.  Avoid sitting for a long time without moving. Get up to take short walks every 1-2 hours. This is important to improve blood flow and breathing. Ask for help if you feel weak or unsteady.  Return to your normal activities as told by your health care provider. Ask your health care provider what activities are safe for you. General instructions   Watch for any blood in your urine. Call your health care provider if the amount of blood in your urine increases.  If you have a catheter: ? Follow instructions from your health care provider about taking care of your catheter and collection bag. ? Do not take baths, swim, or use a hot tub until your health care provider approves. Ask your health care provider if you may take showers. You may only be allowed to  take sponge baths.  Drink enough fluid to keep your urine pale yellow.  Do not use any products that contain nicotine or tobacco, such as cigarettes, e-cigarettes, and chewing tobacco. These can delay healing after surgery. If you need help quitting, ask your health care provider.  Keep all follow-up visits as told by your health care provider. This is important. Contact a health care provider if:  You have pain that gets worse or does not get better with medicine, especially pain when you urinate.  You have difficulty urinating.  You feel nauseous or you vomit repeatedly during a period of more than 2 days after the procedure. Get help right away if:  Your urine is dark red or has blood clots in it.  You are leaking urine (have incontinence).  The end of the stent comes out of your urethra.  You cannot urinate.  You have sudden, sharp, or severe pain in your abdomen or lower back.  You have a fever.  You have swelling or pain in your legs.  You have difficulty breathing. Summary  After the procedure, it is common to have mild pain when you urinate that goes away within a few minutes after you urinate. This may last for up to 1 week.  Watch for any blood in your urine. Call your health care provider if the amount of blood in your urine increases.  Take over-the-counter and prescription medicines only as told by your health care provider.  Drink   enough fluid to keep your urine pale yellow. This information is not intended to replace advice given to you by your health care provider. Make sure you discuss any questions you have with your health care provider.  You may remove the stents by pulling the attached strings on Monday 6/24 and if you don't feel you can do that, please call the office to be seen.   Please bring the stone to the office for analysis.  Document Revised: 06/05/2018 Document Reviewed: 06/06/2018 Elsevier Patient Education  2020 [employer].

## 2020-01-30 NOTE — Anesthesia Postprocedure Evaluation (Signed)
Anesthesia Post Note  Patient: Keith [name].  Procedure(s) Performed: CYSTOSCOPY BILATERAL URETEROSCOPY WITH STENT EXCHANGE (Bilateral Ureter)     Patient location during evaluation: PACU Anesthesia Type: General Level of consciousness: awake and alert Pain management: pain level controlled Vital Signs Assessment: post-procedure vital signs reviewed and stable Respiratory status: spontaneous breathing, nonlabored ventilation, respiratory function stable and patient connected to nasal cannula oxygen Cardiovascular status: blood pressure returned to baseline and stable Postop Assessment: no apparent nausea or vomiting Anesthetic complications: no    Last Vitals:  Vitals:   01/30/20 1301 01/30/20 1315  BP: (!) 143/71 137/76  Pulse: 71 69  Resp: 11 14  Temp:    SpO2: 98% 99%    Last Pain:  Vitals:   01/30/20 1315  TempSrc:   PainSc: 0-No pain                 Barnet Glasgow

## 2020-02-06 DIAGNOSIS — N202 Calculus of kidney with calculus of ureter: Secondary | ICD-10-CM | POA: Diagnosis not present

## 2020-02-06 DIAGNOSIS — N132 Hydronephrosis with renal and ureteral calculous obstruction: Secondary | ICD-10-CM | POA: Diagnosis not present

## 2020-03-02 ENCOUNTER — Encounter: Payer: Self-pay | Admitting: Cardiology

## 2020-03-02 ENCOUNTER — Other Ambulatory Visit: Payer: Self-pay

## 2020-03-02 ENCOUNTER — Ambulatory Visit: Payer: Medicare Other | Admitting: Cardiology

## 2020-03-02 VITALS — BP 123/54 | HR 74 | Resp 16 | Ht 73.0 in | Wt 259.0 lb

## 2020-03-02 DIAGNOSIS — I493 Ventricular premature depolarization: Secondary | ICD-10-CM

## 2020-03-02 DIAGNOSIS — I428 Other cardiomyopathies: Secondary | ICD-10-CM

## 2020-03-02 DIAGNOSIS — I10 Essential (primary) hypertension: Secondary | ICD-10-CM

## 2020-03-02 NOTE — Progress Notes (Signed)
Primary Physician:  Leanna Battles, MD   Patient ID: Keith Drain., male    DOB: [birth year], 74 y.o.   MRN: [id number]  Subjective:    Chief Complaint  Patient presents with  . Follow-up    2 months  . Hypertension  . Cardiomyopathy    HPI: Keith [name].  is a 74 y.o. male  with HTN, HLD, DM type 2, central sleep apnea, and stage 3 CKD, recently reevaluated by [place] for frequent PVCs seen during sleep study  echocardiogram in January 2021 revealing new cardiomyopathy with EF 40 to 45%, nuclear stress test confirmed nonischemic etiology with reduced EF at 35%.  Patient also has had normal coronary arteries by angiography in 2015.  24-hour Holter monitor revealed occasional PVCs. This is a 3 month OV.   Patient is without complaints today. Denies any symptoms of chest pain or dyspnea on exertion.   He is sedentary.  He is on BiPAP for sleep apnea and is been compliant.  I had switched him to carvedilol from metoprolol on his last office visit due to PVCs and cardiomyopathy which is tolerating. Moderately Past Medical History:  Diagnosis Date  . Anginal pain ([place])   . Anxiety    takes Celexa  . Arthritis    bil knees  . Benign prostatic hypertrophy    takes Flomax daily  . Bladder stones 06/25/2013   1.9cm bladder stone.  Marland Kitchen BPH with urinary obstruction 06/25/2013   127cc prostate with large middle lobe and bladder stones.   . Cardiomyopathy ([employer])   . CHF (congestive heart failure) ([place])   . Complication of anesthesia    hard to wake up  . Depression   . Diabetes mellitus without complication ([place])   . Dysrhythmia   . ED (erectile dysfunction)   . History of kidney stones   . Hyperlipidemia   . Hypertension   . PONV (postoperative nausea and vomiting)   . Sleep apnea    uses bipap machine  . Spinal headache    reports last spinal headache was yesterday 05-16-18, lasted for 2 hours a, relief with tylenol , denies vision change with yesterdays occurrence but wife  endorses vision changes with past spinal headaches    . Type 2 DM with CKD stage 3 and hypertension ([place])     Past Surgical History:  Procedure Laterality Date  . CARDIAC CATHETERIZATION  06-15-14  . CATARACT EXTRACTION W/ INTRAOCULAR LENS  IMPLANT, BILATERAL  03/13/2003  . COLONOSCOPY    . CYSTOSCOPY W/ URETERAL STENT PLACEMENT Bilateral 01/08/2020   Procedure: CYSTOSCOPY WITH bilateral  RETROGRADE PYELOGRAM/ bilateral URETERAL STENT PLACEMENT;  Surgeon: Bjorn Loser, MD;  Location: [facility];  Service: Urology;  Laterality: Bilateral;  . CYSTOSCOPY WITH LITHOLAPAXY N/A 06/25/2013   Procedure: CYSTOSCOPY WITH LITHOLAPAXY;  Surgeon: [name], MD;  Location: [facility];  Service: Urology;  Laterality: N/A;  . CYSTOSCOPY/URETEROSCOPY/HOLMIUM LASER/STENT PLACEMENT Bilateral 01/30/2020   Procedure: CYSTOSCOPY BILATERAL URETEROSCOPY WITH STENT EXCHANGE;  Surgeon: [name], MD;  Location: [facility];  Service: Urology;  Laterality: Bilateral;  . KIDNEY STONE SURGERY  01/2020  . KNEE ARTHROSCOPY  09/07/2010   left knee  . KNEE ARTHROSCOPY  05/27/2000   left knee  . LEFT HEART CATHETERIZATION WITH CORONARY ANGIOGRAM N/A 06/17/2014   Procedure: LEFT HEART CATHETERIZATION WITH CORONARY ANGIOGRAM;  Surgeon: Laverda Page, MD;  Location: [facility] CATH LAB;  Service: Cardiovascular;  Laterality: N/A;  . RETINAL DETACHMENT REPAIR W/ SCLERAL BUCKLE LE  10/14/2002   Dr Zadie Rhine  . TONSILLECTOMY     age 84  . TOTAL KNEE ARTHROPLASTY  08/01/2011   Procedure: TOTAL KNEE ARTHROPLASTY;  Surgeon: Lorn Junes, MD;  Location: [facility];  Service: Orthopedics;  Laterality: Right;  TOTAL KNEE ARTHROPLASTY  RIGHT SIDE  . TOTAL KNEE ARTHROPLASTY Left 05/22/2018   Procedure: LEFT TOTAL KNEE ARTHROPLASTY;  Surgeon: Paralee Cancel, MD;  Location: [facility];  Service: Orthopedics;  Laterality: Left;  . TRANSURETHRAL RESECTION OF PROSTATE N/A 06/25/2013   Procedure: TRANSURETHRAL RESECTION OF THE PROSTATE WITH GYRUS INSTRUMENTS;   Surgeon: [name], MD;  Location: [facility];  Service: Urology;  Laterality: N/A;  . VASECTOMY  1982     Tobacco Use: Low Risk   . Smoking Tobacco Use: Never Smoker  . Smokeless Tobacco Use: Never Used  Marital At[place]: Married   Review of Systems  Cardiovascular: Positive for leg swelling. Negative for chest pain and dyspnea on exertion.  Gastrointestinal: Negative for melena.   Objective:  Blood pressure (!) 123/54, pulse 74, resp. rate 16, height 6\' 1"  (1.854 m), weight 259 lb (117.5 kg), SpO2 97 %. Body mass index is 34.17 kg/m.   Vitals with BMI 03/02/2020 01/30/2020 01/30/2020  Height 6\' 1"  - -  Weight 259 lbs - -  BMI 91.47 - -  Systolic 829 562 130  Diastolic 54 75 86  Pulse 74 68 71      Physical Exam Vitals reviewed.  Constitutional:      Appearance: He is well-developed.     Comments: Well built and moderately obese  Cardiovascular:     Rate and Rhythm: Normal rate and regular rhythm.     Pulses: Normal pulses and intact distal pulses.     Heart sounds: Normal heart sounds.     Comments: 2+ bilateral pitting leg edema No JVD  Pulmonary:     Effort: Pulmonary effort is normal. No accessory muscle usage or respiratory distress.     Breath sounds: Normal breath sounds.  Abdominal:     General: Bowel sounds are normal.     Palpations: Abdomen is soft.    Radiology: No results found.  Laboratory examination:   External labs: Lipid Panel completed 06/14/2018 HDL 43 MG/DL 06/14/2018 LDL 62.000 mg 06/14/2018 Cholesterol, total 129.000 m 06/14/2018 Triglycerides 122.000 06/14/2018  A1C 6.600 % 10/09/2019  Glucose Random 275.000 M 06/20/2019 MicroAlbumin Urine 28.000 06/21/2018 MicroAlbumin/Creat 24.4 MG/ 06/21/2018  BUN 21.000 M 06/20/2019 Creatinine, Serum 1.950 MG/ 06/20/2019  CMP Latest Ref Rng & Units 01/23/2020 01/11/2020 01/10/2020  Glucose 70 - 99 mg/dL 191(H) 176(H) 154(H)  BUN 8 - 23 mg/dL 31(H) 54(H) 58(H)  Creatinine 0.61 - 1.24 mg/dL 2.13(H) 2.97(H)  4.10(H)  Sodium 135 - 145 mmol/L 139 140 135  Potassium 3.5 - 5.1 mmol/L 4.7 4.3 4.1  Chloride 98 - 111 mmol/L 106 107 107  CO2 22 - 32 mmol/L 25 23 19(L)  Calcium 8.9 - 10.3 mg/dL 9.0 9.0 8.4(L)  Total Protein 6.5 - 8.1 g/dL - - -  Total Bilirubin 0.3 - 1.2 mg/dL - - -  Alkaline Phos 38 - 126 U/L - - -  AST 15 - 41 U/L - - -  ALT 0 - 44 U/L - - -   CBC Latest Ref Rng & Units 01/23/2020 01/10/2020 01/09/2020  WBC 4.0 - 10.5 K/uL 8.4 6.5 11.4(H)  Hemoglobin 13.0 - 17.0 g/dL 12.3(L) 13.0 13.2  Hematocrit 39 - 52 % 38.7(L) 39.6 39.7  Platelets 150 -  400 K/uL 244 149(L) 156   Lipid Panel     Component Value Date/Time   CHOL 119 11/12/2009 0856   TRIG 75.0 11/12/2009 0856   TRIG 118 06/29/2006 1325   HDL 47.00 11/12/2009 0856   CHOLHDL 3 11/12/2009 0856   VLDL 15.0 11/12/2009 0856   LDLCALC 57 11/12/2009 0856   LDLDIRECT 141.0 01/30/2007 1455   HEMOGLOBIN A1C Lab Results  Component Value Date   HGBA1C 7.4 (H) 01/08/2020   MPG 165.68 01/08/2020   TSH No results for input(s): TSH in the last 8760 hours.  PRN Meds:. Medications Discontinued During This Encounter  Medication Reason  . Armodafinil 250 MG tablet Patient Preference  . HYDROcodone-acetaminophen (NORCO/VICODIN) 5-325 MG tablet Patient Preference  . Semaglutide (OZEMPIC, 0.25 OR 0.5 MG/DOSE, [place]) Patient has not taken in last 30 days   Current Meds  Medication Sig  . aspirin EC 81 MG tablet Take 81 mg by mouth daily.  Marland Kitchen atorvastatin (LIPITOR) 20 MG tablet Take 20 mg by mouth daily.  . carvedilol (COREG) 12.5 MG tablet Take 1 tablet (12.5 mg total) by mouth 2 (two) times daily.  . Cholecalciferol (VITAMIN D) 2000 UNITS tablet Take 2,000 Units by mouth daily.  Marland Kitchen escitalopram (LEXAPRO) 20 MG tablet Take 20 mg by mouth daily.   . finasteride (PROSCAR) 5 MG tablet Take 5 mg by mouth daily.  Marland Kitchen gabapentin (NEURONTIN) 100 MG capsule Take 1 capsule by mouth daily as needed (nerve pain).   . magnesium oxide (MAG-OX) 400 MG  tablet Take 400 mg by mouth daily.  . methocarbamol (ROBAXIN) 500 MG tablet Take 1 tablet (500 mg total) by mouth every 6 (six) hours as needed for muscle spasms.  . nitroGLYCERIN (NITROSTAT) 0.4 MG SL tablet Place 1 tablet (0.4 mg total) under the tongue every 5 (five) minutes as needed for chest pain.  . Omega-3 Fatty Acids (FISH OIL) 1000 MG CAPS Take 1,000 mg by mouth daily.  Marland Kitchen oxybutynin (DITROPAN-XL) 10 MG 24 hr tablet Take 10 mg by mouth at bedtime.     Cardiac Studies:   Coronary Angiogram  [06/17/2014]: Mild noncritical coronary artery disease involving 10-20% stenosis, mid rca 20%, normal LV systolic function.  ABI's [05/14/2014]: Normal bilateral ABI at 1.11 with normal triphasic waveform at the level of the ankle.  Lexiscan (Walking with mod Bruce)Tetrofosmin Stress Test  10/09/2019: Nondiagnostic ECG stress. Resting EKG/ECG demonstrated normal sinus rhythm. Left ventricular strain patterns present.  Peak EKG/ECG revealed no significant ST-T change from baseline abnormality. There is a fixed moderate defect in the inferior and apical regions consistent with scar versus diaphragmatic attenuation. Global hypokinesis with stress LV EF: 35%. LV is mildly dilated in both rest and stress images.  No significant change from 04/21/2014.  Intermediate risk study due to low LVEF.   24-hour Holter monitor 10/02/2019: Normal sinus rhythm.  Rare PAC and PVC.  PVC occurred both in the form of isolated PVCs and in bigeminal pattern.  No atrial fibrillation was noted. No reported symptoms  Echocardiogram 09/25/2019:  Left ventricle cavity is normal in size. Moderate concentric hypertrophy of the left ventricle. Hypokinetic global wall motion. Doppler evidence of grade I (impaired) diastolic dysfunction, normal LAP. Mildly depressed LV systolic function with visual EF 40-45%.  Left atrial cavity is moderately dilated at 4.9 cm. Trileaflet aortic valve.  Mild (Grade I) aortic  regurgitation. Compared to 09/20/2017, previously LVEF was normal around 55%. AV regurgitation new.   EKG:  EKG 03/02/2020: Sinus rhythm with first-degree  AV block at the rate of 74 bpm, left atrial enlargement, left axis deviation, left anterior fascicular block.  Incomplete left bundle branch block.  LVH.  Nonspecific T abnormality.  No significant change from 09/19/2019.  Assessment:     ICD-10-CM   1. Primary hypertension  I10 EKG 12-Lead    PCV ECHOCARDIOGRAM COMPLETE  2. Nonischemic cardiomyopathy (HCC)  I42.8 PCV ECHOCARDIOGRAM COMPLETE  3. Frequent PVCs  I49.3 PCV ECHOCARDIOGRAM COMPLETE    Recommendations:   Keith [name].  is a 74 y.o. male  with HTN, HLD, DM type 2, central sleep apnea, and stage 3 CKD, recently reevaluated by [place] for frequent PVCs seen during sleep study  echocardiogram in January 2021 revealing new cardiomyopathy with EF 40 to 45%, nuclear stress test confirmed nonischemic etiology with reduced EF at 35%.  Patient also has had normal coronary arteries by angiography in 2015.  24-hour Holter monitor revealed occasional PVCs.  He now presents for a 3-month office visit and follow-up.  He is markedly sedentary, no clinical evidence of heart failure, he does have chronic leg edema.  I suspect the edema is related to his sedentary lifestyle and also sitting on the chair most of the time.  On his last office visit I had discontinued metoprolol and switch him to carvedilol which he is tolerating.  I do not see any PVCs on his EKG today.  His blood pressure is well controlled.  Reviewed his external labs, lipids are well controlled, diabetes is well controlled but does have stage IV chronic kidney disease.  Hence not on ACE inhibitor or ARB.  Suspect his cardiomyopathy and frequent PVCs noted during sleep study were related to sleep apnea, he is now compliant with BiPAP, for the past 1 year.  I will repeat echocardiogram and see him back in 6 months after the  echocardiogram.  No clinical evidence of heart failure today.  [name], MD, [facility] 03/02/2020, 3:22 PM [facility] [place] Office: [phone]

## 2020-04-02 ENCOUNTER — Other Ambulatory Visit: Payer: Self-pay

## 2020-04-02 ENCOUNTER — Ambulatory Visit (INDEPENDENT_AMBULATORY_CARE_PROVIDER_SITE_OTHER): Payer: Medicare Other | Admitting: Family Medicine

## 2020-04-02 VITALS — BP 118/70 | HR 72 | Ht 72.0 in | Wt 256.0 lb

## 2020-04-02 DIAGNOSIS — G4730 Sleep apnea, unspecified: Secondary | ICD-10-CM

## 2020-04-02 DIAGNOSIS — G4731 Primary central sleep apnea: Secondary | ICD-10-CM | POA: Diagnosis not present

## 2020-04-02 NOTE — Patient Instructions (Signed)
Please continue using your BiPAP regularly. While your insurance requires that you use BiPAP at least 4 hours each night on 70% of the nights, I recommend, that you not skip any nights and use it throughout the night if you can. Getting used to BiPAP and staying with the treatment long term does take time and patience and discipline. Untreated obstructive sleep apnea when it is moderate to severe can have an adverse impact on cardiovascular health and raise her risk for heart disease, arrhythmias, hypertension, congestive heart failure, stroke and diabetes. Untreated obstructive sleep apnea causes sleep disruption, nonrestorative sleep, and sleep deprivation. This can have an impact on your day to day functioning and cause daytime sleepiness and impairment of cognitive function, memory loss, mood disturbance, and problems focussing. Using BiPAP regularly can improve these symptoms.  We will send orders to your DME for a mask refitting. Please call them if you have not heard anything in 2-3 days.   I will repeat download in 6 weeks. We will follow up in 6 months.   Sleep Apnea Sleep apnea affects breathing during sleep. It causes breathing to stop for a short time or to become shallow. It can also increase the risk of:  Heart attack.  Stroke.  Being very overweight (obese).  Diabetes.  Heart failure.  Irregular heartbeat. The goal of treatment is to help you breathe normally again. What are the causes? There are three kinds of sleep apnea:  Obstructive sleep apnea. This is caused by a blocked or collapsed airway.  Central sleep apnea. This happens when the brain does not send the right signals to the muscles that control breathing.  Mixed sleep apnea. This is a combination of obstructive and central sleep apnea. The most common cause of this condition is a collapsed or blocked airway. This can happen if:  Your throat muscles are too relaxed.  Your tongue and tonsils are too  large.  You are overweight.  Your airway is too small. What increases the risk?  Being overweight.  Smoking.  Having a small airway.  Being older.  Being male.  Drinking alcohol.  Taking medicines to calm yourself (sedatives or tranquilizers).  Having family members with the condition. What are the signs or symptoms?  Trouble staying asleep.  Being sleepy or tired during the day.  Getting angry a lot.  Loud snoring.  Headaches in the morning.  Not being able to focus your mind (concentrate).  Forgetting things.  Less interest in sex.  Mood swings.  Personality changes.  Feelings of sadness (depression).  Waking up a lot during the night to pee (urinate).  Dry mouth.  Sore throat. How is this diagnosed?  Your medical history.  A physical exam.  A test that is done when you are sleeping (sleep study). The test is most often done in a sleep lab but may also be done at home. How is this treated?   Sleeping on your side.  Using a medicine to get rid of mucus in your nose (decongestant).  Avoiding the use of alcohol, medicines to help you relax, or certain pain medicines (narcotics).  Losing weight, if needed.  Changing your diet.  Not smoking.  Using a machine to open your airway while you sleep, such as: ? An oral appliance. This is a mouthpiece that shifts your lower jaw forward. ? A CPAP device. This device blows air through a mask when you breathe out (exhale). ? An EPAP device. This has valves that you put  in each nostril. ? A BPAP device. This device blows air through a mask when you breathe in (inhale) and breathe out.  Having surgery if other treatments do not work. It is important to get treatment for sleep apnea. Without treatment, it can lead to:  High blood pressure.  Coronary artery disease.  In men, not being able to have an erection (impotence).  Reduced thinking ability. Follow these instructions at  home: Lifestyle  Make changes that your doctor recommends.  Eat a healthy diet.  Lose weight if needed.  Avoid alcohol, medicines to help you relax, and some pain medicines.  Do not use any products that contain nicotine or tobacco, such as cigarettes, e-cigarettes, and chewing tobacco. If you need help quitting, ask your doctor. General instructions  Take over-the-counter and prescription medicines only as told by your doctor.  If you were given a machine to use while you sleep, use it only as told by your doctor.  If you are having surgery, make sure to tell your doctor you have sleep apnea. You may need to bring your device with you.  Keep all follow-up visits as told by your doctor. This is important. Contact a doctor if:  The machine that you were given to use during sleep bothers you or does not seem to be working.  You do not get better.  You get worse. Get help right away if:  Your chest hurts.  You have trouble breathing in enough air.  You have an uncomfortable feeling in your back, arms, or stomach.  You have trouble talking.  One side of your body feels weak.  A part of your face is hanging down. These symptoms may be an emergency. Do not wait to see if the symptoms will go away. Get medical help right away. Call your local emergency services (911 in the [place] Do not drive yourself to the hospital. Summary  This condition affects breathing during sleep.  The most common cause is a collapsed or blocked airway.  The goal of treatment is to help you breathe normally while you sleep. This information is not intended to replace advice given to you by your health care provider. Make sure you discuss any questions you have with your health care provider. Document Revised: 06/15/2018 Document Reviewed: 04/24/2018 Elsevier Patient Education  [place].

## 2020-04-02 NOTE — Progress Notes (Addendum)
PATIENT: Keith [name]. DOB: 24-Nov-1945  REASON FOR VISIT: follow up HISTORY FROM: patient  Chief Complaint  Patient presents with  . Follow-up    rm 1 here for a osa f/u     HISTORY OF PRESENT ILLNESS: Today 04/02/20 Keith [name]. is a 74 y.o. male here today for follow up for complex sleep apnea treated with BiPAP. He is using BiPAP most night. He continues to note a leak. He is happy with his full face mask but reports his head gear is loose. It has not been changed in many months. He denies concerns with BiPAP machine.   Complaince report dated 03/03/2020-04/01/2020 reveals daily compliance of 93% and four hour compliance of 90%. Residual AHI 5.5 on IPAP 19cmH20 and EPAP of 15cmH20. Leak noted in the 95% of 67.3L/min.   HISTORY: (copied from my note on 12/18/2019)  Keith [name]. is a 73 y.o. male here today for follow up for primary central sleep apnea recently restarted on BiPAP therapy. Sleep study in 07/2019 revealed "severe central and obstructive sleep apnea, with a primary central component, total AHI of 47.7/hour, O2 nadir of 79%". Titration study confirmed adequate treatment with BiPAP therapy. He is doing well on BiPAP therapy.  He does feel that he is sleeping better.  He has noted a leak in his mask.  He is currently using a full facemask.  He does like the mask he is using and is willing to continue adjusting his headgear to ensure appropriate fit of mask. He does see cardiology regularly.  Lexiscan stress test on 10/09/2019 was stable from 2015.  Holter monitor on 10/02/2019 did reveal rare PACs and PVCs both in isolated and bigeminal pattern.  Atrial fibrillation was not noted.  Compliance report dated 11/18/2019 3 12/17/2019 reveals that he has used BiPAP therapy 28 of the last 30 days for compliance of 93%.  He used BiPAP greater than 4 hours 26 of the last 30 days for compliance of 87%.  Average usage was 7 hours and 9 minutes.  Residual AHI was 5.0 with IPAP of 19  cm of water and EPAP of 15 cm of water.   HISTORY: (copied from Dr Guadelupe Sabin note on 07/03/2019)  Dear Dr. Philip [name], I saw your patient, Keith [name], upon your kind request in my sleep clinic today for reevaluation of his obstructive sleep apnea, on treatment with BiPAP. The patient is accompanied by his wife today. I have previously followed him for his obstructive sleep apnea and recurrent headaches. I last saw him over 3 years ago at which time he was compliant with his BiPAP ST and he was taking Topamax low-dose twice daily for headache prevention.  As you know, Keith [name] is a 74 year old right-handed gentleman with an underlying medical history of type 2 diabetes, osteoarthritis, hyperlipidemia, depression, allergic rhinitis, kidney stones, recurrent headaches, status post retinal detachment surgery, status post right, then leftknee replacements, status post TURP, and obesity, who has been on BiPAP ST for his sleep apnea for years. He had sleep study testing through our office on 04/29/2014 which was a split-night sleep study and a subsequent titration study in September 2015. I reviewed his BiPAP compliance data. Out of the last 30 days he used his machine only 12 days, residual AHI around 8.1/h. Average usage for days on treatment of 4 hours and 10 minutes, leak on the high side with a 95th percentile at 55 L/min, pressure of 20/16 with a backup rate  of 12. In the past 90 days, he used his machine 32 days. Residual AHI around 8.4. He reports that he is trying to get back on the machine, he finds it uncomfortable. He uses a full facemask. In the past 9 days he has consistently tried to use his machine. His wife reports that he does not snore when he sleeps with the BiPAP and seems to be less restless, less twitching noted by wife. He feels that the settings are not optimal, he had stopped using his machine because he had been able to lose weight but he gained some weight back. When he  started gaining weight again, his wife noticed recurrence of snoring. He would be willing to get retested for his sleep apnea and consider consistently using a CPAP or BiPAP machine. He would be eligible for a new machine since his set up date was 05/12/2014. His Epworth sleepiness score is 9 out of 24, fatigue severity score is 62 out of 63. His sleep is interrupted. He has nocturia about once per average night. He generally goes to bed around 10, rise time is between 8 and 9. He has to get up at 4 to let the 2 dogs out, then he has to feed him at 5 AM. He has had morning headaches at times. He has limited his caffeine intake to 1 cup of chai tea per day, occasional soda or occasional ice tea. He does not smoke cigarettes, he smokes a cigar a month approximately. He drinks alcohol rarely.    REVIEW OF SYSTEMS: Out of a complete 14 system review of symptoms, the patient complains only of the following symptoms, none and all other reviewed systems are negative.  ALLERGIES: Allergies  Allergen Reactions  . Penicillins Anaphylaxis    eyes swell shut  Has patient had a PCN reaction causing immediate rash, facial/tongue/throat swelling, SOB or lightheadedness with hypotension: yes Has patient had a PCN reaction causing severe rash involving mucus membranes or skin necrosis: no Has patient had a PCN reaction that required hospitalization no Has patient had a PCN reaction occurring within the last 10 years: no If all of the above answers are "NO", then may proceed with Cephalosporin use.  Keith [name] Reductase Inhibitors     HOME MEDICATIONS: Outpatient Medications Prior to Visit  Medication Sig Dispense Refill  . aspirin EC 81 MG tablet Take 81 mg by mouth daily.    Marland Kitchen atorvastatin (LIPITOR) 20 MG tablet Take 20 mg by mouth daily.    . Cholecalciferol (VITAMIN D) 2000 UNITS tablet Take 2,000 Units by mouth daily.    Marland Kitchen escitalopram (LEXAPRO) 20 MG tablet Take 20 mg by mouth daily.     .  finasteride (PROSCAR) 5 MG tablet Take 5 mg by mouth daily.    Marland Kitchen gabapentin (NEURONTIN) 100 MG capsule Take 1 capsule by mouth daily as needed (nerve pain).     . magnesium oxide (MAG-OX) 400 MG tablet Take 400 mg by mouth daily.    . methocarbamol (ROBAXIN) 500 MG tablet Take 1 tablet (500 mg total) by mouth every 6 (six) hours as needed for muscle spasms. 40 tablet 0  . nitroGLYCERIN (NITROSTAT) 0.4 MG SL tablet Place 1 tablet (0.4 mg total) under the tongue every 5 (five) minutes as needed for chest pain. 25 tablet 3  . Omega-3 Fatty Acids (FISH OIL) 1000 MG CAPS Take 1,000 mg by mouth daily.    Marland Kitchen oxybutynin (DITROPAN-XL) 10 MG 24 hr tablet Take 10 mg by  mouth at bedtime.     . carvedilol (COREG) 12.5 MG tablet Take 1 tablet (12.5 mg total) by mouth 2 (two) times daily. 60 tablet 2   No facility-administered medications prior to visit.    PAST MEDICAL HISTORY: Past Medical History:  Diagnosis Date  . Anginal pain ([place])   . Anxiety    takes Celexa  . Arthritis    bil knees  . Benign prostatic hypertrophy    takes Flomax daily  . Bladder stones 06/25/2013   1.9cm bladder stone.  Marland Kitchen BPH with urinary obstruction 06/25/2013   127cc prostate with large middle lobe and bladder stones.   . Cardiomyopathy ([place])   . CHF (congestive heart failure) ([place])   . Complication of anesthesia    hard to wake up  . Depression   . Diabetes mellitus without complication ([place])   . Dysrhythmia   . ED (erectile dysfunction)   . History of kidney stones   . Hyperlipidemia   . Hypertension   . PONV (postoperative nausea and vomiting)   . Sleep apnea    uses bipap machine  . Spinal headache    reports last spinal headache was yesterday 05-16-18, lasted for 2 hours a, relief with tylenol , denies vision change with yesterdays occurrence but wife endorses vision changes with past spinal headaches    . Type 2 DM with CKD stage 3 and hypertension ([place])     PAST SURGICAL HISTORY: Past Surgical History:    Procedure Laterality Date  . CARDIAC CATHETERIZATION  06-15-14  . CATARACT EXTRACTION W/ INTRAOCULAR LENS  IMPLANT, BILATERAL  03/13/2003  . COLONOSCOPY    . CYSTOSCOPY W/ URETERAL STENT PLACEMENT Bilateral 01/08/2020   Procedure: CYSTOSCOPY WITH bilateral  RETROGRADE PYELOGRAM/ bilateral URETERAL STENT PLACEMENT;  Surgeon: Bjorn Loser, MD;  Location: [facility];  Service: Urology;  Laterality: Bilateral;  . CYSTOSCOPY WITH LITHOLAPAXY N/A 06/25/2013   Procedure: CYSTOSCOPY WITH LITHOLAPAXY;  Surgeon: [name], MD;  Location: [facility];  Service: Urology;  Laterality: N/A;  . CYSTOSCOPY/URETEROSCOPY/HOLMIUM LASER/STENT PLACEMENT Bilateral 01/30/2020   Procedure: CYSTOSCOPY BILATERAL URETEROSCOPY WITH STENT EXCHANGE;  Surgeon: [name], MD;  Location: [facility];  Service: Urology;  Laterality: Bilateral;  . KIDNEY STONE SURGERY  01/2020  . KNEE ARTHROSCOPY  09/07/2010   left knee  . KNEE ARTHROSCOPY  05/27/2000   left knee  . LEFT HEART CATHETERIZATION WITH CORONARY ANGIOGRAM N/A 06/17/2014   Procedure: LEFT HEART CATHETERIZATION WITH CORONARY ANGIOGRAM;  Surgeon: Laverda Page, MD;  Location: [facility] CATH LAB;  Service: Cardiovascular;  Laterality: N/A;  . RETINAL DETACHMENT REPAIR W/ SCLERAL BUCKLE LE  10/14/2002   Dr Zadie Rhine  . TONSILLECTOMY     age 24  . TOTAL KNEE ARTHROPLASTY  08/01/2011   Procedure: TOTAL KNEE ARTHROPLASTY;  Surgeon: Lorn Junes, MD;  Location: [facility];  Service: Orthopedics;  Laterality: Right;  TOTAL KNEE ARTHROPLASTY  RIGHT SIDE  . TOTAL KNEE ARTHROPLASTY Left 05/22/2018   Procedure: LEFT TOTAL KNEE ARTHROPLASTY;  Surgeon: Paralee Cancel, MD;  Location: [facility];  Service: Orthopedics;  Laterality: Left;  . TRANSURETHRAL RESECTION OF PROSTATE N/A 06/25/2013   Procedure: TRANSURETHRAL RESECTION OF THE PROSTATE WITH GYRUS INSTRUMENTS;  Surgeon: [name], MD;  Location: [facility];  Service: Urology;  Laterality: N/A;  . VASECTOMY  1982    FAMILY HISTORY: Family History   Problem Relation Age of Onset  . Hypertension Mother   . Heart failure Mother   . Heart failure Father   .  Arthritis Other   . Cancer Other        breast, skin  . Coronary artery disease Other   . Heart disease Other   . Anesthesia problems Maternal Grandmother   . Hypotension Neg Hx   . Malignant hyperthermia Neg Hx   . Pseudochol deficiency Neg Hx     SOCIAL HISTORY: Social History   Socioeconomic History  . Marital status: Married    Spouse name: Not on file  . Number of children: 2  . Years of education: Not on file  . Highest education level: Not on file  Occupational History  . Not on file  Tobacco Use  . Smoking status: Never Smoker  . Smokeless tobacco: Never Used  Vaping Use  . Vaping Use: Never used  Substance and Sexual Activity  . Alcohol use: Yes    Alcohol/week: 0.0 standard drinks    Comment: rare   . Drug use: No  . Sexual activity: Not Currently  Other Topics Concern  . Not on file  Social History Narrative   Right handed.    Social Determinants of Health   Financial Resource Strain:   . Difficulty of Paying Living Expenses:   Food Insecurity:   . Worried About Charity fundraiser in the Last Year:   . Arboriculturist in the Last Year:   Transportation Needs:   . Film/video editor (Medical):   Marland Kitchen Lack of Transportation (Non-Medical):   Physical Activity:   . Days of Exercise per Week:   . Minutes of Exercise per Session:   Stress:   . Feeling of Stress :   Social Connections:   . Frequency of Communication with Friends and Family:   . Frequency of Social Gatherings with Friends and Family:   . Attends Religious Services:   . Active Member of Clubs or Organizations:   . Attends Archivist Meetings:   Marland Kitchen Marital Status:   Intimate Partner Violence:   . Fear of Current or Ex-Partner:   . Emotionally Abused:   Marland Kitchen Physically Abused:   . Sexually Abused:       PHYSICAL EXAM  Vitals:   04/02/20 1110  BP: 118/70   Pulse: 72  Weight: (!) 256 lb (116.1 kg)  Height: 6' (1.829 m)   Body mass index is 34.72 kg/m.  Generalized: Well developed, in no acute distress  Cardiology: normal rate and rhythm, no murmur noted Respiratory: clear to auscultation bilaterally  Neurological examination  Mentation: Alert oriented to time, place, history taking. Follows all commands speech and language fluent Cranial nerve II-XII: Pupils were equal round reactive to light. Extraocular movements were full, visual field were full  Motor: The motor testing reveals 5 over 5 strength of all 4 extremities. Good symmetric motor tone is noted throughout.  Gait and station: Gait is normal.   DIAGNOSTIC DATA (LABS, IMAGING, TESTING) - I reviewed patient records, labs, notes, testing and imaging myself where available.  No flowsheet data found.   Lab Results  Component Value Date   WBC 8.4 01/23/2020   HGB 12.3 (L) 01/23/2020   HCT 38.7 (L) 01/23/2020   MCV 95.1 01/23/2020   PLT 244 01/23/2020      Component Value Date/Time   NA 139 01/23/2020 1428   K 4.7 01/23/2020 1428   CL 106 01/23/2020 1428   CO2 25 01/23/2020 1428   GLUCOSE 191 (H) 01/23/2020 1428   GLUCOSE 139 (H) 06/29/2006 1325   BUN 31 (  H) 01/23/2020 1428   CREATININE 2.13 (H) 01/23/2020 1428   CALCIUM 9.0 01/23/2020 1428   PROT 6.6 01/08/2020 1546   ALBUMIN 3.4 (L) 01/08/2020 1546   AST 14 (L) 01/08/2020 1546   ALT 14 01/08/2020 1546   ALKPHOS 69 01/08/2020 1546   BILITOT 0.6 01/08/2020 1546   GFRNONAA 30 (L) 01/23/2020 1428   GFRAA 35 (L) 01/23/2020 1428   Lab Results  Component Value Date   CHOL 119 11/12/2009   HDL 47.00 11/12/2009   LDLCALC 57 11/12/2009   LDLDIRECT 141.0 01/30/2007   TRIG 75.0 11/12/2009   CHOLHDL 3 11/12/2009   Lab Results  Component Value Date   HGBA1C 7.4 (H) 01/08/2020   No results found for: VITAMINB12 Lab Results  Component Value Date   TSH 1.90 08/19/2010     ASSESSMENT AND PLAN 74 y.o. year old  male  has a past medical history of Anginal pain ([place]), Anxiety, Arthritis, Benign prostatic hypertrophy, Bladder stones (06/25/2013), BPH with urinary obstruction (06/25/2013), Cardiomyopathy ([place]), CHF (congestive heart failure) ([place]), Complication of anesthesia, Depression, Diabetes mellitus without complication ([place]), Dysrhythmia, ED (erectile dysfunction), History of kidney stones, Hyperlipidemia, Hypertension, PONV (postoperative nausea and vomiting), Sleep apnea, Spinal headache, and Type 2 DM with CKD stage 3 and hypertension ([place]). here with     ICD-10-CM   1. Primary central sleep apnea  G47.31 For home use only DME Bipap  2. Sleep apnea treated with nocturnal BiPAP  G47.30 For home use only DME Bipap     Normon continues to tolerate BiPAP therapy. Unfortunately, he continues to have a large leak. He reports that head gear has not been changed recently. I will place orders for mask refitting. Compliance report shows optimum compliance. He was encouraged to continue using CPAP nightly and for greater than 4 hours each night. I will repeat download in 6 weeks to assess leak. He will follow up in 6 months, sooner if needed.   Orders Placed This Encounter  Procedures  . For home use only DME Bipap    Mask refitting    Order Specific Question:   Length of Need    Answer:   Lifetime    Order Specific Question:   Inspiratory pressure    Answer:   OTHER SEE COMMENTS    Order Specific Question:   Expiratory pressure    Answer:   OTHER SEE COMMENTS     No orders of the defined types were placed in this encounter.     I spent 15 minutes with the patient. 50% of this time was spent counseling and educating patient on plan of care and medications.    Debbora Presto, FNP-C 04/02/2020, 4:11 PM [facility] [address] [place], [place] [address] [phone]  I reviewed the above note and documentation by the Nurse Practitioner and agree with the history, exam,  assessment and plan as outlined above. I was available for consultation. Star Age, MD, PhD [facility])

## 2020-04-09 DIAGNOSIS — N401 Enlarged prostate with lower urinary tract symptoms: Secondary | ICD-10-CM | POA: Diagnosis not present

## 2020-04-09 DIAGNOSIS — N3941 Urge incontinence: Secondary | ICD-10-CM | POA: Diagnosis not present

## 2020-04-09 DIAGNOSIS — N270 Small kidney, unilateral: Secondary | ICD-10-CM | POA: Diagnosis not present

## 2020-04-09 DIAGNOSIS — N200 Calculus of kidney: Secondary | ICD-10-CM | POA: Diagnosis not present

## 2020-04-09 DIAGNOSIS — N311 Reflex neuropathic bladder, not elsewhere classified: Secondary | ICD-10-CM | POA: Diagnosis not present

## 2020-04-17 ENCOUNTER — Ambulatory Visit (INDEPENDENT_AMBULATORY_CARE_PROVIDER_SITE_OTHER): Payer: Medicare Other | Admitting: Podiatry

## 2020-04-17 ENCOUNTER — Encounter: Payer: Self-pay | Admitting: Podiatry

## 2020-04-17 ENCOUNTER — Other Ambulatory Visit: Payer: Self-pay

## 2020-04-17 DIAGNOSIS — R600 Localized edema: Secondary | ICD-10-CM

## 2020-04-17 DIAGNOSIS — M79674 Pain in right toe(s): Secondary | ICD-10-CM

## 2020-04-17 DIAGNOSIS — B351 Tinea unguium: Secondary | ICD-10-CM

## 2020-04-17 DIAGNOSIS — E1121 Type 2 diabetes mellitus with diabetic nephropathy: Secondary | ICD-10-CM

## 2020-04-17 DIAGNOSIS — N1832 Chronic kidney disease, stage 3b: Secondary | ICD-10-CM

## 2020-04-17 DIAGNOSIS — M79675 Pain in left toe(s): Secondary | ICD-10-CM

## 2020-04-17 DIAGNOSIS — E119 Type 2 diabetes mellitus without complications: Secondary | ICD-10-CM | POA: Diagnosis not present

## 2020-04-17 DIAGNOSIS — E1142 Type 2 diabetes mellitus with diabetic polyneuropathy: Secondary | ICD-10-CM | POA: Diagnosis not present

## 2020-04-17 DIAGNOSIS — E1122 Type 2 diabetes mellitus with diabetic chronic kidney disease: Secondary | ICD-10-CM

## 2020-04-17 NOTE — Progress Notes (Signed)
Subjective: Keith [name]. presents today for for annual diabetic foot examination and painful thick toenails that are difficult to trim. Pain interferes with ambulation. Aggravating factors include wearing enclosed shoe gear. Pain is relieved with periodic professional debridement..  Patient utilizes compression stockings for management of edema.  Leanna Battles, MD is patient's PCP. Last visit   Past Medical History:  Diagnosis Date  . Anginal pain ([place])   . Anxiety    takes Celexa  . Arthritis    bil knees  . Benign prostatic hypertrophy    takes Flomax daily  . Bladder stones 06/25/2013   1.9cm bladder stone.  Marland Kitchen BPH with urinary obstruction 06/25/2013   127cc prostate with large middle lobe and bladder stones.   . Cardiomyopathy ([place])   . CHF (congestive heart failure) ([place])   . Complication of anesthesia    hard to wake up  . Depression   . Diabetes mellitus without complication ([place])   . Dysrhythmia   . ED (erectile dysfunction)   . History of kidney stones   . Hyperlipidemia   . Hypertension   . PONV (postoperative nausea and vomiting)   . Sleep apnea    uses bipap machine  . Spinal headache    reports last spinal headache was yesterday 05-16-18, lasted for 2 hours a, relief with tylenol , denies vision change with yesterdays occurrence but wife endorses vision changes with past spinal headaches    . Type 2 DM with CKD stage 3 and hypertension [facility])     Patient Active Problem List   Diagnosis Date Noted  . Acute renal failure superimposed on stage 3b chronic kidney disease ([place]) 01/08/2020  . Type 2 diabetes mellitus with stage 3 chronic kidney disease ([place]) 01/08/2020  . Hyperlipidemia associated with type 2 diabetes mellitus ([place]) 01/08/2020  . Renal calculus, bilateral 01/08/2020  . Hydronephrosis of right kidney 01/08/2020  . Renal failure 01/08/2020  . Primary central sleep apnea 12/18/2019  . Sleep apnea treated with nocturnal BiPAP 12/18/2019  .  Aftercare 08/13/2018  . Obese 05/23/2018  . S/P left TKA 05/22/2018  . Depression 02/13/2018  . BPH with urinary obstruction 06/25/2013  . Bladder stones 06/25/2013  . Diabetes type 2, uncontrolled ([place]) 08/14/2012  . History of total knee replacement, right 08/01/2011  . DJD (degenerative joint disease) of knee 07/20/2011  . Hyperlipidemia 07/11/2011  . RHINITIS 01/28/2010  . MIGRAINE, CHRONIC 11/28/2008  . ERECTILE DYSFUNCTION, MILD 05/28/2008  . BENIGN PROSTATIC HYPERTROPHY 03/28/2008  . Hypertension associated with diabetes ([place]) 07/09/2007    Past Surgical History:  Procedure Laterality Date  . CARDIAC CATHETERIZATION  06-15-14  . CATARACT EXTRACTION W/ INTRAOCULAR LENS  IMPLANT, BILATERAL  03/13/2003  . COLONOSCOPY    . CYSTOSCOPY W/ URETERAL STENT PLACEMENT Bilateral 01/08/2020   Procedure: CYSTOSCOPY WITH bilateral  RETROGRADE PYELOGRAM/ bilateral URETERAL STENT PLACEMENT;  Surgeon: Bjorn Loser, MD;  Location: [facility];  Service: Urology;  Laterality: Bilateral;  . CYSTOSCOPY WITH LITHOLAPAXY N/A 06/25/2013   Procedure: CYSTOSCOPY WITH LITHOLAPAXY;  Surgeon: [name], MD;  Location: [facility];  Service: Urology;  Laterality: N/A;  . CYSTOSCOPY/URETEROSCOPY/HOLMIUM LASER/STENT PLACEMENT Bilateral 01/30/2020   Procedure: CYSTOSCOPY BILATERAL URETEROSCOPY WITH STENT EXCHANGE;  Surgeon: [name], MD;  Location: [facility];  Service: Urology;  Laterality: Bilateral;  . KIDNEY STONE SURGERY  01/2020  . KNEE ARTHROSCOPY  09/07/2010   left knee  . KNEE ARTHROSCOPY  05/27/2000   left knee  . LEFT HEART CATHETERIZATION WITH CORONARY ANGIOGRAM  N/A 06/17/2014   Procedure: LEFT HEART CATHETERIZATION WITH CORONARY ANGIOGRAM;  Surgeon: Laverda Page, MD;  Location: [facility] C[facility] LAB;  Service: Cardiovascular;  Laterality: N/A;  . RETINAL DETACHMENT REPAIR W/ SCLERAL BUCKLE LE  10/14/2002   Dr Zadie Rhine  . TONSILLECTOMY     age 74  . TOTAL KNEE ARTHROPLASTY  08/01/2011   Procedure: TOTAL  KNEE ARTHROPLASTY;  Surgeon: Lorn Junes, MD;  Location: [place];  Service: Orthopedics;  Laterality: Right;  TOTAL KNEE ARTHROPLASTY  RIGHT SIDE  . TOTAL KNEE ARTHROPLASTY Left 05/22/2018   Procedure: LEFT TOTAL KNEE ARTHROPLASTY;  Surgeon: Paralee Cancel, MD;  Location: [facility];  Service: Orthopedics;  Laterality: Left;  . TRANSURETHRAL RESECTION OF PROSTATE N/A 06/25/2013   Procedure: TRANSURETHRAL RESECTION OF THE PROSTATE WITH GYRUS INSTRUMENTS;  Surgeon: [name], MD;  Location: [facility];  Service: Urology;  Laterality: N/A;  . VASECTOMY  1982    Current Outpatient Medications on File Prior to Visit  Medication Sig Dispense Refill  . aspirin EC 81 MG tablet Take 81 mg by mouth daily.    Marland Kitchen atorvastatin (LIPITOR) 20 MG tablet Take 20 mg by mouth daily.    . carvedilol (COREG) 12.5 MG tablet Take 1 tablet (12.5 mg total) by mouth 2 (two) times daily. 60 tablet 2  . Cholecalciferol (VITAMIN D) 2000 UNITS tablet Take 2,000 Units by mouth daily.    Marland Kitchen escitalopram (LEXAPRO) 20 MG tablet Take 20 mg by mouth daily.     . finasteride (PROSCAR) 5 MG tablet Take 5 mg by mouth daily.    Marland Kitchen gabapentin (NEURONTIN) 100 MG capsule Take 1 capsule by mouth daily as needed (nerve pain).     . magnesium oxide (MAG-OX) 400 MG tablet Take 400 mg by mouth daily.    . methocarbamol (ROBAXIN) 500 MG tablet Take 1 tablet (500 mg total) by mouth every 6 (six) hours as needed for muscle spasms. 40 tablet 0  . nitroGLYCERIN (NITROSTAT) 0.4 MG SL tablet Place 1 tablet (0.4 mg total) under the tongue every 5 (five) minutes as needed for chest pain. 25 tablet 3  . Omega-3 Fatty Acids (FISH OIL) 1000 MG CAPS Take 1,000 mg by mouth daily.    Marland Kitchen oxybutynin (DITROPAN-XL) 10 MG 24 hr tablet Take 10 mg by mouth at bedtime.     Marland Kitchen oxyCODONE-acetaminophen (PERCOCET/ROXICET) 5-325 MG tablet     . OZEMPIC, 0.25 OR 0.5 MG/DOSE, 2 MG/1.5ML SOPN     . OZEMPIC, 1 MG/DOSE, 2 MG/1.5ML SOPN Inject 1 mg into the skin once a week.    .  trospium (SANCTURA) 20 MG tablet Take 20 mg by mouth 2 (two) times daily.     No current facility-administered medications on file prior to visit.     Allergies  Allergen Reactions  . Penicillins Anaphylaxis    eyes swell shut  Has patient had a PCN reaction causing immediate rash, facial/tongue/throat swelling, SOB or lightheadedness with hypotension: yes Has patient had a PCN reaction causing severe rash involving mucus membranes or skin necrosis: no Has patient had a PCN reaction that required hospitalization no Has patient had a PCN reaction occurring within the last 10 years: no If all of the above answers are "NO", then may proceed with Cephalosporin use.  Judeth Cornfield Reductase Inhibitors     Social History   Occupational History  . Not on file  Tobacco Use  . Smoking status: Never Smoker  . Smokeless tobacco: Never Used  Vaping Use  .  Vaping Use: Never used  Substance and Sexual Activity  . Alcohol use: Yes    Alcohol/week: 0.0 standard drinks    Comment: rare   . Drug use: No  . Sexual activity: Not Currently    Family History  Problem Relation Age of Onset  . Hypertension Mother   . Heart failure Mother   . Heart failure Father   . Arthritis Other   . Cancer Other        breast, skin  . Coronary artery disease Other   . Heart disease Other   . Anesthesia problems Maternal Grandmother   . Hypotension Neg Hx   . Malignant hyperthermia Neg Hx   . Pseudochol deficiency Neg Hx     Immunization History  Administered Date(s) Administered  . DTaP 04/12/2010  . Influenza Whole 09/12/2005, 06/12/2009  . Influenza-Unspecified 06/12/2014  . PFIZER SARS-COV-2 Vaccination 10/17/2019, 11/11/2019  . Pneumococcal Polysaccharide-23 05/28/2008  . Td 09/12/1998     Objective: There were no vitals filed for this visit.  Keith [name]. is a pleasant 74 y.o. male in NAD. AAO X 3.  Vascular Examination: Capillary refill time to digits immediate b/l. Palpable pedal  pulses b/l LE. Pedal hair sparse. Lower extremity skin temperature gradient within normal limits. No pain with calf compression b/l. Nonpitting edema noted b/l lower extremities. Evidence of chronic venous insufficiency b/l LE.   Dermatological Examination: Pedal skin with normal turgor, texture and tone bilaterally. No open wounds bilaterally. No interdigital macerations bilaterally. Toenails 1-5 b/l elongated, discolored, dystrophic, thickened, crumbly with subungual debris and tenderness to dorsal palpation.  Musculoskeletal Examination: Normal muscle strength 5/5 to all lower extremity muscle groups bilaterally. No pain crepitus or joint limitation noted with ROM b/l. Hallux valgus with bunion deformity noted b/l lower extremities. Patient ambulates independent of any assistive aids. Wearing Skechers slip on loafers with memory foam insoles.  Footwear Assessment: Does the patient wear appropriate shoes? Yes Does the patient need inserts/orthotics? Not presently.  Neurological Examination: Pt has subjective symptoms of neuropathy. Protective sensation intact 5/5 intact bilaterally with 10g monofilament b/l. Vibratory sensation decreased b/l. Proprioception intact bilaterally. Deep tendon reflexes normal b/l.  Clonus negative b/l.  Hemoglobin A1C Latest Ref Rng & Units 01/08/2020  HGBA1C 4.8 - 5.6 % 7.4(H)  Some recent data might be hidden   Assessment: 1. Pain due to onychomycosis of toenails of both feet   2. Localized edema   3. Type 2 diabetes mellitus with stage 3b chronic kidney disease, without long-term current use of insulin ([place])   4. Encounter for diabetic foot exam ([place])      ADA Foot Risk Categorization: Low Risk :  Patient has all of the following: Intact protective sensation No prior foot ulcer  No severe deformity Pedal pulses present  Plan: -Examined patient. -No new findings. No new orders. -Diabetic foot examination performed on today's visit. -Continue  diabetic foot care principles. -Toenails 1-5 b/l were debrided in length and girth with sterile nail nippers and dremel without iatrogenic bleeding.  -Patient to report any pedal injuries to medical professional immediately. -Patient to continue soft, supportive shoe gear daily. -Patient/POA to call should there be question/concern in the interim.  Return in about 3 months (around 07/18/2020) for diabetic nail trim.  Marzetta Board, DPM

## 2020-04-17 NOTE — Patient Instructions (Signed)
Diabetes Mellitus and Foot Care Foot care is an important part of your health, especially when you have diabetes. Diabetes may cause you to have problems because of poor blood flow (circulation) to your feet and legs, which can cause your skin to:  Become thinner and drier.  Break more easily.  Heal more slowly.  Peel and crack. You may also have nerve damage (neuropathy) in your legs and feet, causing decreased feeling in them. This means that you may not notice minor injuries to your feet that could lead to more serious problems. Noticing and addressing any potential problems early is the best way to prevent future foot problems. How to care for your feet Foot hygiene  Wash your feet daily with warm water and mild soap. Do not use hot water. Then, pat your feet and the areas between your toes until they are completely dry. Do not soak your feet as this can dry your skin.  Trim your toenails straight across. Do not dig under them or around the cuticle. File the edges of your nails with an emery board or nail file.  Apply a moisturizing lotion or petroleum jelly to the skin on your feet and to dry, brittle toenails. Use lotion that does not contain alcohol and is unscented. Do not apply lotion between your toes. Shoes and socks  Wear clean socks or stockings every day. Make sure they are not too tight. Do not wear knee-high stockings since they may decrease blood flow to your legs.  Wear shoes that fit properly and have enough cushioning. Always look in your shoes before you put them on to be sure there are no objects inside.  To break in new shoes, wear them for just a few hours a day. This prevents injuries on your feet. Wounds, scrapes, corns, and calluses  Check your feet daily for blisters, cuts, bruises, sores, and redness. If you cannot see the bottom of your feet, use a mirror or ask someone for help.  Do not cut corns or calluses or try to remove them with medicine.  If you  find a minor scrape, cut, or break in the skin on your feet, keep it and the skin around it clean and dry. You may clean these areas with mild soap and water. Do not clean the area with peroxide, alcohol, or iodine.  If you have a wound, scrape, corn, or callus on your foot, look at it several times a day to make sure it is healing and not infected. Check for: ? Redness, swelling, or pain. ? Fluid or blood. ? Warmth. ? Pus or a bad smell. General instructions  Do not cross your legs. This may decrease blood flow to your feet.  Do not use heating pads or hot water bottles on your feet. They may burn your skin. If you have lost feeling in your feet or legs, you may not know this is happening until it is too late.  Protect your feet from hot and cold by wearing shoes, such as at the beach or on hot pavement.  Schedule a complete foot exam at least once a year (annually) or more often if you have foot problems. If you have foot problems, report any cuts, sores, or bruises to your health care provider immediately. Contact a health care provider if:  You have a medical condition that increases your risk of infection and you have any cuts, sores, or bruises on your feet.  You have an injury that is not   healing.  You have redness on your legs or feet.  You feel burning or tingling in your legs or feet.  You have pain or cramps in your legs and feet.  Your legs or feet are numb.  Your feet always feel cold.  You have pain around a toenail. Get help right away if:  You have a wound, scrape, corn, or callus on your foot and: ? You have pain, swelling, or redness that gets worse. ? You have fluid or blood coming from the wound, scrape, corn, or callus. ? Your wound, scrape, corn, or callus feels warm to the touch. ? You have pus or a bad smell coming from the wound, scrape, corn, or callus. ? You have a fever. ? You have a red line going up your leg. Summary  Check your feet every day  for cuts, sores, red spots, swelling, and blisters.  Moisturize feet and legs daily.  Wear shoes that fit properly and have enough cushioning.  If you have foot problems, report any cuts, sores, or bruises to your health care provider immediately.  Schedule a complete foot exam at least once a year (annually) or more often if you have foot problems. This information is not intended to replace advice given to you by your health care provider. Make sure you discuss any questions you have with your health care provider. Document Revised: 05/22/2019 Document Reviewed: 09/30/2016 Elsevier Patient Education  2020 [employer]  

## 2020-05-13 ENCOUNTER — Other Ambulatory Visit: Payer: Self-pay

## 2020-05-13 ENCOUNTER — Other Ambulatory Visit: Payer: Medicare Other

## 2020-05-13 DIAGNOSIS — Z20822 Contact with and (suspected) exposure to covid-19: Secondary | ICD-10-CM

## 2020-05-14 ENCOUNTER — Other Ambulatory Visit: Payer: Self-pay | Admitting: Cardiology

## 2020-05-14 DIAGNOSIS — I428 Other cardiomyopathies: Secondary | ICD-10-CM

## 2020-05-14 LAB — NOVEL CORONAVIRUS, NAA: SARS-CoV-2, NAA: NOT DETECTED

## 2020-05-25 DIAGNOSIS — Z85828 Personal history of other malignant neoplasm of skin: Secondary | ICD-10-CM | POA: Diagnosis not present

## 2020-05-25 DIAGNOSIS — L309 Dermatitis, unspecified: Secondary | ICD-10-CM | POA: Diagnosis not present

## 2020-05-27 ENCOUNTER — Telehealth: Payer: Self-pay | Admitting: Family Medicine

## 2020-05-27 NOTE — Telephone Encounter (Signed)
Please contact patient and see if he was able to have a mask refitting. I have reviewed most recent download that shows leak has not improved. In fact, it was a little worse. Residual apnea is 6.2. If he has not had refitting, please assist him with contacting DME. If he has please remind him to make sure headgear is tight and mask has appropriate seal.

## 2020-05-27 NOTE — Telephone Encounter (Signed)
I called pt.  He stated that he was contacted by DME [place], he received a new harness and relates that he feels more rested.  I did give him results that his most recent download did not show any improvement.  He was ok to have mask refitting done.  I relayed will contact DME and let them know.  He verbalized understanding.

## 2020-06-08 DIAGNOSIS — Z85828 Personal history of other malignant neoplasm of skin: Secondary | ICD-10-CM | POA: Diagnosis not present

## 2020-06-08 DIAGNOSIS — L720 Epidermal cyst: Secondary | ICD-10-CM | POA: Diagnosis not present

## 2020-06-08 DIAGNOSIS — L918 Other hypertrophic disorders of the skin: Secondary | ICD-10-CM | POA: Diagnosis not present

## 2020-06-11 DIAGNOSIS — E785 Hyperlipidemia, unspecified: Secondary | ICD-10-CM | POA: Diagnosis not present

## 2020-06-11 DIAGNOSIS — I10 Essential (primary) hypertension: Secondary | ICD-10-CM | POA: Diagnosis not present

## 2020-06-11 DIAGNOSIS — N1832 Chronic kidney disease, stage 3b: Secondary | ICD-10-CM | POA: Diagnosis not present

## 2020-06-11 DIAGNOSIS — M79642 Pain in left hand: Secondary | ICD-10-CM | POA: Diagnosis not present

## 2020-06-11 DIAGNOSIS — I739 Peripheral vascular disease, unspecified: Secondary | ICD-10-CM | POA: Diagnosis not present

## 2020-06-11 DIAGNOSIS — G4733 Obstructive sleep apnea (adult) (pediatric): Secondary | ICD-10-CM | POA: Diagnosis not present

## 2020-06-11 DIAGNOSIS — M545 Low back pain: Secondary | ICD-10-CM | POA: Diagnosis not present

## 2020-06-11 DIAGNOSIS — M79641 Pain in right hand: Secondary | ICD-10-CM | POA: Diagnosis not present

## 2020-06-11 DIAGNOSIS — I2510 Atherosclerotic heart disease of native coronary artery without angina pectoris: Secondary | ICD-10-CM | POA: Diagnosis not present

## 2020-06-11 DIAGNOSIS — E1121 Type 2 diabetes mellitus with diabetic nephropathy: Secondary | ICD-10-CM | POA: Diagnosis not present

## 2020-06-17 NOTE — Telephone Encounter (Signed)
Received this message from [employer].

## 2020-06-17 NOTE — Telephone Encounter (Signed)
I called pt and relayed that [employer] has been trying to call and set up mask refitting.  He states they have nuisance call blocker and it may have been blocking the call.  I gave him the # [phone] to call to set up mask refitting.  He will do so.

## 2020-06-18 DIAGNOSIS — Z23 Encounter for immunization: Secondary | ICD-10-CM | POA: Diagnosis not present

## 2020-07-15 DIAGNOSIS — N3941 Urge incontinence: Secondary | ICD-10-CM | POA: Diagnosis not present

## 2020-07-15 DIAGNOSIS — R8279 Other abnormal findings on microbiological examination of urine: Secondary | ICD-10-CM | POA: Diagnosis not present

## 2020-07-15 DIAGNOSIS — R3914 Feeling of incomplete bladder emptying: Secondary | ICD-10-CM | POA: Diagnosis not present

## 2020-07-24 ENCOUNTER — Other Ambulatory Visit: Payer: Self-pay

## 2020-07-24 ENCOUNTER — Ambulatory Visit (INDEPENDENT_AMBULATORY_CARE_PROVIDER_SITE_OTHER): Payer: Medicare Other | Admitting: Podiatry

## 2020-07-24 DIAGNOSIS — M79675 Pain in left toe(s): Secondary | ICD-10-CM

## 2020-07-24 DIAGNOSIS — B351 Tinea unguium: Secondary | ICD-10-CM

## 2020-07-24 DIAGNOSIS — N1832 Chronic kidney disease, stage 3b: Secondary | ICD-10-CM

## 2020-07-24 DIAGNOSIS — E1122 Type 2 diabetes mellitus with diabetic chronic kidney disease: Secondary | ICD-10-CM

## 2020-07-24 DIAGNOSIS — M79674 Pain in right toe(s): Secondary | ICD-10-CM

## 2020-07-24 DIAGNOSIS — E1142 Type 2 diabetes mellitus with diabetic polyneuropathy: Secondary | ICD-10-CM

## 2020-07-26 ENCOUNTER — Encounter: Payer: Self-pay | Admitting: Podiatry

## 2020-07-26 NOTE — Progress Notes (Signed)
Subjective: Keith [name]. presents today for for annual diabetic foot examination and painful thick toenails that are difficult to trim. Pain interferes with ambulation. Aggravating factors include wearing enclosed shoe gear. Pain is relieved with periodic professional debridement..  Patient utilizes compression stockings for management of edema. He voices no new pedal concerns on today's visit.  Leanna Battles, MD is patient's PCP. Last visit was 01/20/2020.  Past Medical History:  Diagnosis Date  . Anginal pain ([place])   . Anxiety    takes Celexa  . Arthritis    bil knees  . Benign prostatic hypertrophy    takes Flomax daily  . Bladder stones 06/25/2013   1.9cm bladder stone.  Marland Kitchen BPH with urinary obstruction 06/25/2013   127cc prostate with large middle lobe and bladder stones.   . Cardiomyopathy ([place])   . CHF (congestive heart failure) ([place])   . Complication of anesthesia    hard to wake up  . Depression   . Diabetes mellitus without complication ([place])   . Dysrhythmia   . ED (erectile dysfunction)   . History of kidney stones   . Hyperlipidemia   . Hypertension   . PONV (postoperative nausea and vomiting)   . Sleep apnea    uses bipap machine  . Spinal headache    reports last spinal headache was yesterday 05-16-18, lasted for 2 hours a, relief with tylenol , denies vision change with yesterdays occurrence but wife endorses vision changes with past spinal headaches    . Type 2 DM with CKD stage 3 and hypertension [facility])     Patient Active Problem List   Diagnosis Date Noted  . Acute renal failure superimposed on stage 3b chronic kidney disease ([place]) 01/08/2020  . Type 2 diabetes mellitus with stage 3 chronic kidney disease ([place]) 01/08/2020  . Hyperlipidemia associated with type 2 diabetes mellitus ([place]) 01/08/2020  . Renal calculus, bilateral 01/08/2020  . Hydronephrosis of right kidney 01/08/2020  . Renal failure 01/08/2020  . Primary central sleep apnea 12/18/2019   . Sleep apnea treated with nocturnal BiPAP 12/18/2019  . Aftercare 08/13/2018  . Obese 05/23/2018  . S/P left TKA 05/22/2018  . Depression 02/13/2018  . BPH with urinary obstruction 06/25/2013  . Bladder stones 06/25/2013  . Diabetes type 2, uncontrolled ([place]) 08/14/2012  . History of total knee replacement, right 08/01/2011  . DJD (degenerative joint disease) of knee 07/20/2011  . Hyperlipidemia 07/11/2011  . RHINITIS 01/28/2010  . MIGRAINE, CHRONIC 11/28/2008  . ERECTILE DYSFUNCTION, MILD 05/28/2008  . BENIGN PROSTATIC HYPERTROPHY 03/28/2008  . Hypertension associated with diabetes ([place]) 07/09/2007    Past Surgical History:  Procedure Laterality Date  . CARDIAC CATHETERIZATION  06-15-14  . CATARACT EXTRACTION W/ INTRAOCULAR LENS  IMPLANT, BILATERAL  03/13/2003  . COLONOSCOPY    . CYSTOSCOPY W/ URETERAL STENT PLACEMENT Bilateral 01/08/2020   Procedure: CYSTOSCOPY WITH bilateral  RETROGRADE PYELOGRAM/ bilateral URETERAL STENT PLACEMENT;  Surgeon: Bjorn Loser, MD;  [facility];  Service: Urology;  Laterality: Bilateral;  . CYSTOSCOPY WITH LITHOLAPAXY N/A 06/25/2013   Procedure: CYSTOSCOPY WITH LITHOLAPAXY;  Surgeon: [name], MD;  [facility];  Service: Urology;  Laterality: N/A;  . CYSTOSCOPY/URETEROSCOPY/HOLMIUM LASER/STENT PLACEMENT Bilateral 01/30/2020   Procedure: CYSTOSCOPY BILATERAL URETEROSCOPY WITH STENT EXCHANGE;  Surgeon: [name], MD;  [facility];  Service: Urology;  Laterality: Bilateral;  . KIDNEY STONE SURGERY  01/2020  . KNEE ARTHROSCOPY  09/07/2010   left knee  . KNEE ARTHROSCOPY  05/27/2000  left knee  . LEFT HEART CATHETERIZATION WITH CORONARY ANGIOGRAM N/A 06/17/2014   Procedure: LEFT HEART CATHETERIZATION WITH CORONARY ANGIOGRAM;  Surgeon: Laverda Page, MD;  Location: [facility];  Service: Cardiovascular;  Laterality: N/A;  . RETINAL DETACHMENT REPAIR W/ SCLERAL BUCKLE LE  10/14/2002   Dr Zadie Rhine  . TONSILLECTOMY     age 84  .  TOTAL KNEE ARTHROPLASTY  08/01/2011   Procedure: TOTAL KNEE ARTHROPLASTY;  Surgeon: Lorn Junes, MD;  Location: [place];  Service: Orthopedics;  Laterality: Right;  TOTAL KNEE ARTHROPLASTY  RIGHT SIDE  . TOTAL KNEE ARTHROPLASTY Left 05/22/2018   Procedure: LEFT TOTAL KNEE ARTHROPLASTY;  Surgeon: Paralee Cancel, MD;  [facility];  Service: Orthopedics;  Laterality: Left;  . TRANSURETHRAL RESECTION OF PROSTATE N/A 06/25/2013   Procedure: TRANSURETHRAL RESECTION OF THE PROSTATE WITH GYRUS INSTRUMENTS;  Surgeon: [name], MD;  [facility];  Service: Urology;  Laterality: N/A;  . VASECTOMY  1982    Current Outpatient Medications on File Prior to Visit  Medication Sig Dispense Refill  . Armodafinil 250 MG tablet Take 250 mg by mouth daily.    Marland Kitchen aspirin EC 81 MG tablet Take 81 mg by mouth daily.    Marland Kitchen atorvastatin (LIPITOR) 20 MG tablet Take 20 mg by mouth daily.    . carvedilol (COREG) 12.5 MG tablet TAKE ONE TABLET TWICE DAILY 60 tablet 2  . Cholecalciferol (VITAMIN D) 2000 UNITS tablet Take 2,000 Units by mouth daily.    Marland Kitchen doxycycline (VIBRA-TABS) 100 MG tablet Take 100 mg by mouth 2 (two) times daily.    Marland Kitchen escitalopram (LEXAPRO) 20 MG tablet Take 20 mg by mouth daily.     . finasteride (PROSCAR) 5 MG tablet Take 5 mg by mouth daily.    Marland Kitchen gabapentin (NEURONTIN) 100 MG capsule Take 1 capsule by mouth daily as needed (nerve pain).     . magnesium oxide (MAG-OX) 400 MG tablet Take 400 mg by mouth daily.    . methocarbamol (ROBAXIN) 500 MG tablet Take 1 tablet (500 mg total) by mouth every 6 (six) hours as needed for muscle spasms. 40 tablet 0  . nitroGLYCERIN (NITROSTAT) 0.4 MG SL tablet Place 1 tablet (0.4 mg total) under the tongue every 5 (five) minutes as needed for chest pain. 25 tablet 3  . Omega-3 Fatty Acids (FISH OIL) 1000 MG CAPS Take 1,000 mg by mouth daily.    Marland Kitchen oxybutynin (DITROPAN-XL) 10 MG 24 hr tablet Take 10 mg by mouth at bedtime.     Marland Kitchen oxyCODONE-acetaminophen  (PERCOCET/ROXICET) 5-325 MG tablet     . OZEMPIC, 0.25 OR 0.5 MG/DOSE, 2 MG/1.5ML SOPN     . OZEMPIC, 1 MG/DOSE, 2 MG/1.5ML SOPN Inject 1 mg into the skin once a week.    . solifenacin (VESICARE) 10 MG tablet Take 10 mg by mouth daily.    Marland Kitchen triamcinolone cream (KENALOG) 0.1 % Apply 1 application topically 2 (two) times daily.    . trospium (SANCTURA) 20 MG tablet Take 20 mg by mouth 2 (two) times daily.     No current facility-administered medications on file prior to visit.     Allergies  Allergen Reactions  . Penicillins Anaphylaxis    eyes swell shut  Has patient had a PCN reaction causing immediate rash, facial/tongue/throat swelling, SOB or lightheadedness with hypotension: yes Has patient had a PCN reaction causing severe rash involving mucus membranes or skin necrosis: no Has patient had a PCN reaction that required  hospitalization no Has patient had a PCN reaction occurring within the last 10 years: no If all of the above answers are "NO", then may proceed with Cephalosporin use.  Judeth Cornfield Reductase Inhibitors     Social History   Occupational History  . Not on file  Tobacco Use  . Smoking status: Never Smoker  . Smokeless tobacco: Never Used  Vaping Use  . Vaping Use: Never used  Substance and Sexual Activity  . Alcohol use: Yes    Alcohol/week: 0.0 standard drinks    Comment: rare   . Drug use: No  . Sexual activity: Not Currently    Family History  Problem Relation Age of Onset  . Hypertension Mother   . Heart failure Mother   . Heart failure Father   . Arthritis Other   . Cancer Other        breast, skin  . Coronary artery disease Other   . Heart disease Other   . Anesthesia problems Maternal Grandmother   . Hypotension Neg Hx   . Malignant hyperthermia Neg Hx   . Pseudochol deficiency Neg Hx     Immunization History  Administered Date(s) Administered  . DTaP 04/12/2010  . Influenza Whole 09/12/2005, 06/12/2009  . Influenza-Unspecified  06/12/2014  . PFIZER SARS-COV-2 Vaccination 10/17/2019, 11/11/2019  . Pneumococcal Polysaccharide-23 05/28/2008  . Td 09/12/1998     Objective: There were no vitals filed for this visit.  Makih Stefanko. is a pleasant 74 y.o. male in NAD. AAO X 3.  Vascular Examination: Capillary refill time to digits immediate b/l. Palpable pedal pulses b/l LE. Pedal hair sparse. Lower extremity skin temperature gradient within normal limits. No pain with calf compression b/l. Nonpitting edema noted b/l lower extremities. Evidence of chronic venous insufficiency b/l LE.   Dermatological Examination: Pedal skin with normal turgor, texture and tone bilaterally. No open wounds bilaterally. No interdigital macerations bilaterally. Toenails 1-5 b/l elongated, discolored, dystrophic, thickened, crumbly with subungual debris and tenderness to dorsal palpation.  Musculoskeletal Examination: Normal muscle strength 5/5 to all lower extremity muscle groups bilaterally. No pain crepitus or joint limitation noted with ROM b/l. Hallux valgus with bunion deformity noted b/l lower extremities. Patient ambulates independent of any assistive aids. Wearing Skechers slip on loafers with memory foam insoles.  Neurological Examination: Pt has subjective symptoms of neuropathy. Protective sensation intact 5/5 intact bilaterally with 10g monofilament b/l. Vibratory sensation decreased b/l. Proprioception intact bilaterally. Deep tendon reflexes normal b/l.  Clonus negative b/l.  Hemoglobin A1C Latest Ref Rng & Units 01/08/2020  HGBA1C 4.8 - 5.6 % 7.4(H)  Some recent data might be hidden   Assessment: 1. Pain due to onychomycosis of toenails of both feet   2. Diabetic peripheral neuropathy associated with type 2 diabetes mellitus ([place])   3. Type 2 diabetes mellitus with stage 3b chronic kidney disease, without long-term current use of insulin ([place])     Plan: -Examined patient. -No new findings. No new orders. -Diabetic  foot examination performed on today's visit. -Continue diabetic foot care principles. -Toenails 1-5 b/l were debrided in length and girth with sterile nail nippers and dremel without iatrogenic bleeding.  -Patient to report any pedal injuries to medical professional immediately. -Patient to continue soft, supportive shoe gear daily. -Patient/POA to call should there be question/concern in the interim.  Return in about 3 months (around 10/24/2020) for diabetic nail trim.  Marzetta Board, DPM

## 2020-08-14 DIAGNOSIS — N200 Calculus of kidney: Secondary | ICD-10-CM | POA: Diagnosis not present

## 2020-08-14 DIAGNOSIS — N183 Chronic kidney disease, stage 3 unspecified: Secondary | ICD-10-CM | POA: Diagnosis not present

## 2020-08-14 DIAGNOSIS — N179 Acute kidney failure, unspecified: Secondary | ICD-10-CM | POA: Diagnosis not present

## 2020-08-14 DIAGNOSIS — I129 Hypertensive chronic kidney disease with stage 1 through stage 4 chronic kidney disease, or unspecified chronic kidney disease: Secondary | ICD-10-CM | POA: Diagnosis not present

## 2020-08-17 DIAGNOSIS — I119 Hypertensive heart disease without heart failure: Secondary | ICD-10-CM | POA: Diagnosis not present

## 2020-08-17 DIAGNOSIS — E1121 Type 2 diabetes mellitus with diabetic nephropathy: Secondary | ICD-10-CM | POA: Diagnosis not present

## 2020-08-17 DIAGNOSIS — N1832 Chronic kidney disease, stage 3b: Secondary | ICD-10-CM | POA: Diagnosis not present

## 2020-08-17 DIAGNOSIS — R413 Other amnesia: Secondary | ICD-10-CM | POA: Diagnosis not present

## 2020-08-17 DIAGNOSIS — F4321 Adjustment disorder with depressed mood: Secondary | ICD-10-CM | POA: Diagnosis not present

## 2020-08-17 DIAGNOSIS — E785 Hyperlipidemia, unspecified: Secondary | ICD-10-CM | POA: Diagnosis not present

## 2020-08-17 DIAGNOSIS — E559 Vitamin D deficiency, unspecified: Secondary | ICD-10-CM | POA: Diagnosis not present

## 2020-09-01 ENCOUNTER — Ambulatory Visit: Payer: Medicare Other

## 2020-09-01 ENCOUNTER — Other Ambulatory Visit: Payer: Self-pay

## 2020-09-01 ENCOUNTER — Other Ambulatory Visit: Payer: Medicare Other

## 2020-09-01 DIAGNOSIS — I428 Other cardiomyopathies: Secondary | ICD-10-CM

## 2020-09-01 DIAGNOSIS — I10 Essential (primary) hypertension: Secondary | ICD-10-CM

## 2020-09-01 DIAGNOSIS — I493 Ventricular premature depolarization: Secondary | ICD-10-CM

## 2020-09-03 DIAGNOSIS — R0981 Nasal congestion: Secondary | ICD-10-CM | POA: Diagnosis not present

## 2020-09-03 DIAGNOSIS — J029 Acute pharyngitis, unspecified: Secondary | ICD-10-CM | POA: Diagnosis not present

## 2020-09-03 DIAGNOSIS — R059 Cough, unspecified: Secondary | ICD-10-CM | POA: Diagnosis not present

## 2020-09-03 DIAGNOSIS — Z20822 Contact with and (suspected) exposure to covid-19: Secondary | ICD-10-CM | POA: Diagnosis not present

## 2020-09-07 ENCOUNTER — Ambulatory Visit: Payer: Medicare Other | Admitting: Cardiology

## 2020-09-10 ENCOUNTER — Encounter (INDEPENDENT_AMBULATORY_CARE_PROVIDER_SITE_OTHER): Payer: Self-pay | Admitting: Ophthalmology

## 2020-09-10 ENCOUNTER — Other Ambulatory Visit: Payer: Self-pay

## 2020-09-10 ENCOUNTER — Ambulatory Visit (INDEPENDENT_AMBULATORY_CARE_PROVIDER_SITE_OTHER): Payer: Medicare Other | Admitting: Ophthalmology

## 2020-09-10 DIAGNOSIS — H35371 Puckering of macula, right eye: Secondary | ICD-10-CM | POA: Diagnosis not present

## 2020-09-10 DIAGNOSIS — Z961 Presence of intraocular lens: Secondary | ICD-10-CM

## 2020-09-10 DIAGNOSIS — G4730 Sleep apnea, unspecified: Secondary | ICD-10-CM | POA: Diagnosis not present

## 2020-09-10 DIAGNOSIS — H43813 Vitreous degeneration, bilateral: Secondary | ICD-10-CM | POA: Diagnosis not present

## 2020-09-10 DIAGNOSIS — E119 Type 2 diabetes mellitus without complications: Secondary | ICD-10-CM | POA: Insufficient documentation

## 2020-09-10 NOTE — Assessment & Plan Note (Signed)
Observe OU, no holes or breaks

## 2020-09-10 NOTE — Assessment & Plan Note (Signed)
The nature of macular pucker (epiretinal membrane ERM) was discussed with the patient as well as threshold criteria for vitrectomy surgery. I explained that in rare cases another surgery is needed to actually remove a second wrinkle should it regrow.  Most often, the epiretinal membrane and underlying wrinkled internal limiting membrane are removed with the first surgery, to accomplish the goals.   If the operative eye is Phakic (natural lens still present), cataract surgery is often recommended prior to Vitrectomy. This will enable the retina surgeon to have the best view during surgery and the patient to obtain optimal results in the future. Treatment options were discussed.  OD, minor, no impact on acuity will observe

## 2020-09-10 NOTE — Assessment & Plan Note (Addendum)
No Detectable diabetic retinopathy to date

## 2020-09-10 NOTE — Progress Notes (Signed)
09/10/2020     CHIEF COMPLAINT Patient presents for Retina Follow Up (1 Year F/U OU//Pt c/o gradually worsening VA OU, especially when watching TV. Pt c/o foggy VA OU. Pt is still on BIPAP machine.//A1c: 6.6 approx, 3 months ago/LBS: 110 yesterday)   HISTORY OF PRESENT ILLNESS: Keith [name]. is a 74 y.o. male who presents to the clinic today for:   HPI    Retina Follow Up    Patient presents with  Other.  In both eyes.  This started 1 year ago.  Severity is mild.  Duration of 1 year.  Since onset it is gradually worsening. Additional comments: 1 Year F/U OU  Pt c/o gradually worsening VA OU, especially when watching TV. Pt c/o foggy VA OU. Pt is still on BIPAP machine.  A1c: 6.6 approx, 3 months ago LBS: 110 yesterday       Last edited by Rockie Neighbours, [place] on 09/10/2020  8:16 AM. (History)      Referring physician: Leanna Battles, MD [place],  [place] [address]  HISTORICAL INFORMATION:   Selected notes from the MEDICAL RECORD NUMBER    Lab Results  Component Value Date   HGBA1C 7.4 (H) 01/08/2020     CURRENT MEDICATIONS: No current outpatient medications on file. (Ophthalmic Drugs)   No current facility-administered medications for this visit. (Ophthalmic Drugs)   Current Outpatient Medications (Other)  Medication Sig  . Armodafinil 250 MG tablet Take 250 mg by mouth daily.  Marland Kitchen aspirin EC 81 MG tablet Take 81 mg by mouth daily.  Marland Kitchen atorvastatin (LIPITOR) 20 MG tablet Take 20 mg by mouth daily.  . carvedilol (COREG) 12.5 MG tablet TAKE ONE TABLET TWICE DAILY  . Cholecalciferol (VITAMIN D) 2000 UNITS tablet Take 2,000 Units by mouth daily.  Marland Kitchen doxycycline (VIBRA-TABS) 100 MG tablet Take 100 mg by mouth 2 (two) times daily.  Marland Kitchen escitalopram (LEXAPRO) 20 MG tablet Take 20 mg by mouth daily.   . finasteride (PROSCAR) 5 MG tablet Take 5 mg by mouth daily.  Marland Kitchen gabapentin (NEURONTIN) 100 MG capsule Take 1 capsule by mouth daily as needed (nerve pain).    . magnesium oxide (MAG-OX) 400 MG tablet Take 400 mg by mouth daily.  . methocarbamol (ROBAXIN) 500 MG tablet Take 1 tablet (500 mg total) by mouth every 6 (six) hours as needed for muscle spasms.  . nitroGLYCERIN (NITROSTAT) 0.4 MG SL tablet Place 1 tablet (0.4 mg total) under the tongue every 5 (five) minutes as needed for chest pain.  . Omega-3 Fatty Acids (FISH OIL) 1000 MG CAPS Take 1,000 mg by mouth daily.  Marland Kitchen oxybutynin (DITROPAN-XL) 10 MG 24 hr tablet Take 10 mg by mouth at bedtime.   Marland Kitchen oxyCODONE-acetaminophen (PERCOCET/ROXICET) 5-325 MG tablet   . OZEMPIC, 0.25 OR 0.5 MG/DOSE, 2 MG/1.5ML SOPN   . OZEMPIC, 1 MG/DOSE, 2 MG/1.5ML SOPN Inject 1 mg into the skin once a week.  . solifenacin (VESICARE) 10 MG tablet Take 10 mg by mouth daily.  Marland Kitchen triamcinolone cream (KENALOG) 0.1 % Apply 1 application topically 2 (two) times daily.  . trospium (SANCTURA) 20 MG tablet Take 20 mg by mouth 2 (two) times daily.   No current facility-administered medications for this visit. (Other)      REVIEW OF SYSTEMS:    ALLERGIES Allergies  Allergen Reactions  . Penicillins Anaphylaxis    eyes swell shut  Has patient had a PCN reaction causing immediate rash, facial/tongue/throat swelling, SOB or lightheadedness with  hypotension: yes Has patient had a PCN reaction causing severe rash involving mucus membranes or skin necrosis: no Has patient had a PCN reaction that required hospitalization no Has patient had a PCN reaction occurring within the last 10 years: no If all of the above answers are "NO", then may proceed with Cephalosporin use.  Judeth Cornfield Reductase Inhibitors     PAST MEDICAL HISTORY Past Medical History:  Diagnosis Date  . Anginal pain ([place])   . Anxiety    takes Celexa  . Arthritis    bil knees  . Benign prostatic hypertrophy    takes Flomax daily  . Bladder stones 06/25/2013   1.9cm bladder stone.  Marland Kitchen BPH with urinary obstruction 06/25/2013   127cc prostate with large  middle lobe and bladder stones.   . Cardiomyopathy ([place])   . CHF (congestive heart failure) ([place])   . Complication of anesthesia    hard to wake up  . Depression   . Diabetes mellitus without complication ([place])   . Dysrhythmia   . ED (erectile dysfunction)   . History of kidney stones   . Hyperlipidemia   . Hypertension   . PONV (postoperative nausea and vomiting)   . Sleep apnea    uses bipap machine  . Spinal headache    reports last spinal headache was yesterday 05-16-18, lasted for 2 hours a, relief with tylenol , denies vision change with yesterdays occurrence but wife endorses vision changes with past spinal headaches    . Type 2 DM with CKD stage 3 and hypertension ([place])    Past Surgical History:  Procedure Laterality Date  . CARDIAC CATHETERIZATION  06-15-14  . CATARACT EXTRACTION W/ INTRAOCULAR LENS  IMPLANT, BILATERAL  03/13/2003  . COLONOSCOPY    . CYSTOSCOPY W/ URETERAL STENT PLACEMENT Bilateral 01/08/2020   Procedure: CYSTOSCOPY WITH bilateral  RETROGRADE PYELOGRAM/ bilateral URETERAL STENT PLACEMENT;  Surgeon: Bjorn Loser, MD;  [facility];  Service: Urology;  Laterality: Bilateral;  . CYSTOSCOPY WITH LITHOLAPAXY N/A 06/25/2013   Procedure: CYSTOSCOPY WITH LITHOLAPAXY;  Surgeon: [name], MD;  [facility];  Service: Urology;  Laterality: N/A;  . CYSTOSCOPY/URETEROSCOPY/HOLMIUM LASER/STENT PLACEMENT Bilateral 01/30/2020   Procedure: CYSTOSCOPY BILATERAL URETEROSCOPY WITH STENT EXCHANGE;  Surgeon: [name], MD;  [facility];  Service: Urology;  Laterality: Bilateral;  . KIDNEY STONE SURGERY  01/2020  . KNEE ARTHROSCOPY  09/07/2010   left knee  . KNEE ARTHROSCOPY  05/27/2000   left knee  . LEFT HEART CATHETERIZATION WITH CORONARY ANGIOGRAM N/A 06/17/2014   Procedure: LEFT HEART CATHETERIZATION WITH CORONARY ANGIOGRAM;  Surgeon: Laverda Page, MD;  Location: [facility] C[facility] LAB;  Service: Cardiovascular;  Laterality: N/A;  . RETINAL DETACHMENT REPAIR W/  SCLERAL BUCKLE LE  10/14/2002   Dr Zadie Rhine  . TONSILLECTOMY     age 23  . TOTAL KNEE ARTHROPLASTY  08/01/2011   Procedure: TOTAL KNEE ARTHROPLASTY;  Surgeon: Lorn Junes, MD;  Location: [place];  Service: Orthopedics;  Laterality: Right;  TOTAL KNEE ARTHROPLASTY  RIGHT SIDE  . TOTAL KNEE ARTHROPLASTY Left 05/22/2018   Procedure: LEFT TOTAL KNEE ARTHROPLASTY;  Surgeon: Paralee Cancel, MD;  [facility];  Service: Orthopedics;  Laterality: Left;  . TRANSURETHRAL RESECTION OF PROSTATE N/A 06/25/2013   Procedure: TRANSURETHRAL RESECTION OF THE PROSTATE WITH GYRUS INSTRUMENTS;  Surgeon: [name], MD;  [facility];  Service: Urology;  Laterality: N/A;  . VASECTOMY  1982    FAMILY HISTORY Family History  Problem Relation Age  of Onset  . Hypertension Mother   . Heart failure Mother   . Heart failure Father   . Arthritis Other   . Cancer Other        breast, skin  . Coronary artery disease Other   . Heart disease Other   . Anesthesia problems Maternal Grandmother   . Hypotension Neg Hx   . Malignant hyperthermia Neg Hx   . Pseudochol deficiency Neg Hx     SOCIAL HISTORY Social History   Tobacco Use  . Smoking status: Never Smoker  . Smokeless tobacco: Never Used  Vaping Use  . Vaping Use: Never used  Substance Use Topics  . Alcohol use: Yes    Alcohol/week: 0.0 standard drinks    Comment: rare   . Drug use: No         OPHTHALMIC EXAM: Base Eye Exam    Visual Acuity (ETDRS)      Right Left   Dist cc 20/20 20/25   Correction: Glasses       Tonometry (Tonopen, 8:16 AM)      Right Left   Pressure 10 11       Pupils      Pupils Dark Light Shape React APD   Right PERRL 5 4 Round Brisk None   Left PERRL 5 4 Round Brisk None       Visual Fields (Counting fingers)      Left Right    Full Full       Extraocular Movement      Right Left    Full Full       Neuro/Psych    Oriented x3: Yes   Mood/Affect: Normal       Dilation    Both eyes: 1.0%  Mydriacyl, 2.5% Phenylephrine @ 8:19 AM        Slit Lamp and Fundus Exam    External Exam      Right Left   External Normal Normal       Slit Lamp Exam      Right Left   Lids/Lashes Normal Normal   Conjunctiva/Sclera White and quiet White and quiet   Cornea Clear Clear   Anterior Chamber Deep and quiet Deep and quiet   Iris Round and reactive Round and reactive   Lens Centered posterior chamber intraocular lens Centered posterior chamber intraocular lens   Anterior Vitreous Normal Normal       Fundus Exam      Right Left   Posterior Vitreous Posterior vitreous detachment Posterior vitreous detachment   Disc Normal Normal   C/D Ratio 0.45 0.45   Macula No significant topographic distortion No significant topographic distortion   Vessels Normal, , no DR Normal, , no DR   Periphery Normal Normal          IMAGING AND PROCEDURES  Imaging and Procedures for 09/10/20           ASSESSMENT/PLAN:  Diabetes mellitus without complication (HCC) No Detectable diabetic retinopathy to date  Right epiretinal membrane The nature of macular pucker (epiretinal membrane ERM) was discussed with the patient as well as threshold criteria for vitrectomy surgery. I explained that in rare cases another surgery is needed to actually remove a second wrinkle should it regrow.  Most often, the epiretinal membrane and underlying wrinkled internal limiting membrane are removed with the first surgery, to accomplish the goals.   If the operative eye is Phakic (natural lens still present), cataract surgery is often recommended prior to  Vitrectomy. This will enable the retina surgeon to have the best view during surgery and the patient to obtain optimal results in the future. Treatment options were discussed.  OD, minor, no impact on acuity will observe  Posterior vitreous detachment, both eyes Observe OU, no holes or breaks      ICD-10-CM   1. Right epiretinal membrane  H35.371   2.  Pseudophakia  Z96.1   3. Diabetes mellitus without complication ([place])  B51.0   4. Posterior vitreous detachment, both eyes  H43.813   5. Sleep apnea treated with nocturnal BiPAP  G47.30     1.  No detectable diabetic retinopathy OU  2.  OD with minor epiretinal membrane no visual acuity impact  3.  Sleep apnea, with excellent compliance, no evidence yet of macular disease  Ophthalmic Meds Ordered this visit:  No orders of the defined types were placed in this encounter.      Return in about 1 year (around 09/10/2021) for DILATE OU, OCT, COLOR FP.  There are no Patient Instructions on file for this visit.   Explained the diagnoses, plan, and follow up with the patient and they expressed understanding.  Patient expressed understanding of the importance of proper follow up care.   Clent Demark Jamarius Saha M.D. Diseases & Surgery of the Retina and Vitreous [facility] [place] 09/10/20     Abbreviations: M myopia (nearsighted); A astigmatism; H hyperopia (farsighted); P presbyopia; Mrx spectacle prescription;  CTL contact lenses; OD right eye; OS left eye; OU both eyes  XT exotropia; ET esotropia; PEK punctate epithelial keratitis; PEE punctate epithelial erosions; DES dry eye syndrome; MGD meibomian gland dysfunction; ATs artificial tears; PFAT's preservative free artificial tears; Ross nuclear sclerotic cataract; PSC posterior subcapsular cataract; ERM epi-retinal membrane; PVD posterior vitreous detachment; RD retinal detachment; DM diabetes mellitus; DR diabetic retinopathy; NPDR non-proliferative diabetic retinopathy; PDR proliferative diabetic retinopathy; CSME clinically significant macular edema; DME diabetic macular edema; dbh dot blot hemorrhages; CWS cotton wool spot; POAG primary open angle glaucoma; C/D cup-to-disc ratio; HVF humphrey visual field; GVF goldmann visual field; OCT optical coherence tomography; IOP intraocular pressure; BRVO Branch retinal vein occlusion; CRVO  central retinal vein occlusion; CRAO central retinal artery occlusion; BRAO branch retinal artery occlusion; RT retinal tear; SB scleral buckle; PPV pars plana vitrectomy; VH Vitreous hemorrhage; PRP panretinal laser photocoagulation; IVK intravitreal kenalog; VMT vitreomacular traction; MH Macular hole;  NVD neovascularization of the disc; NVE neovascularization elsewhere; AREDS age related eye disease study; ARMD age related macular degeneration; POAG primary open angle glaucoma; EBMD epithelial/anterior basement membrane dystrophy; ACIOL anterior chamber intraocular lens; IOL intraocular lens; PCIOL posterior chamber intraocular lens; Phaco/IOL phacoemulsification with intraocular lens placement; [place] photorefractive keratectomy; LASIK laser assisted in situ keratomileusis; HTN hypertension; DM diabetes mellitus; COPD chronic obstructive pulmonary disease

## 2020-09-14 ENCOUNTER — Ambulatory Visit: Payer: Medicare Other | Admitting: Cardiology

## 2020-09-17 ENCOUNTER — Ambulatory Visit: Payer: Medicare Other | Admitting: Cardiology

## 2020-09-17 ENCOUNTER — Other Ambulatory Visit: Payer: Self-pay

## 2020-09-17 ENCOUNTER — Encounter: Payer: Self-pay | Admitting: Cardiology

## 2020-09-17 VITALS — BP 140/71 | HR 66 | Ht 72.0 in | Wt 272.0 lb

## 2020-09-17 DIAGNOSIS — I5032 Chronic diastolic (congestive) heart failure: Secondary | ICD-10-CM | POA: Diagnosis not present

## 2020-09-17 DIAGNOSIS — I493 Ventricular premature depolarization: Secondary | ICD-10-CM

## 2020-09-17 DIAGNOSIS — I10 Essential (primary) hypertension: Secondary | ICD-10-CM | POA: Diagnosis not present

## 2020-09-17 DIAGNOSIS — E1122 Type 2 diabetes mellitus with diabetic chronic kidney disease: Secondary | ICD-10-CM | POA: Diagnosis not present

## 2020-09-17 DIAGNOSIS — Z794 Long term (current) use of insulin: Secondary | ICD-10-CM

## 2020-09-17 DIAGNOSIS — N184 Chronic kidney disease, stage 4 (severe): Secondary | ICD-10-CM

## 2020-09-17 DIAGNOSIS — I5031 Acute diastolic (congestive) heart failure: Secondary | ICD-10-CM

## 2020-09-17 DIAGNOSIS — I428 Other cardiomyopathies: Secondary | ICD-10-CM | POA: Diagnosis not present

## 2020-09-17 MED ORDER — METOPROLOL TARTRATE 37.5 MG PO TABS: 37.5000 mg | ORAL_TABLET | Freq: Every day | ORAL | 3 refills | Status: DC

## 2020-09-17 MED ORDER — FUROSEMIDE 40 MG PO TABS: 40.0000 mg | ORAL_TABLET | Freq: Every day | ORAL | 2 refills | Status: AC | PRN

## 2020-09-17 NOTE — Patient Instructions (Signed)
Please remind him to get blood work done today and again in 2 weeks.

## 2020-09-17 NOTE — Progress Notes (Signed)
Primary Physician:  Leanna Battles, MD   Patient ID: Keith Drain., male    DOB: August 10, 1946, 75 y.o.   MRN: [id number]  Subjective:    Chief Complaint  Patient presents with  . Nonischemic cardiomyopathy   . Follow-up  . Hypertension    HPI: Keith [name].  is a 75 y.o. male  with HTN, HLD, DM type 2, central sleep apnea, and stage 3 CKD,  frequent PVCs seen during sleep study  echocardiogram in January 2021 revealing new cardiomyopathy with EF 40 to 45%, nuclear stress test confirmed nonischemic etiology with reduced EF at 35%.  Patient also has had normal coronary arteries by angiography in 2015.  24-hour Holter monitor revealed occasional PVCs. This is a 6 month OV.   Patient is without complaints today. Denies any symptoms of chest pain or dyspnea on exertion.   He is sedentary.  He is on BiPAP for sleep apnea and is been compliant.   States that his diabetes has now improved and 3 months ago states that his A1c was around 6.7%.  He has gained about 10 pounds in weight over the holidays.  He has also noticed worsening leg edema.  No PND or orthopnea.  Past Medical History:  Diagnosis Date  . Anginal pain ([place])   . Anxiety    takes Celexa  . Arthritis    bil knees  . Benign prostatic hypertrophy    takes Flomax daily  . Bladder stones 06/25/2013   1.9cm bladder stone.  Marland Kitchen BPH with urinary obstruction 06/25/2013   127cc prostate with large middle lobe and bladder stones.   . Cardiomyopathy ([place])   . CHF (congestive heart failure) ([place])   . Complication of anesthesia    hard to wake up  . Depression   . Diabetes mellitus without complication ([place])   . Dysrhythmia   . ED (erectile dysfunction)   . History of kidney stones   . Hyperlipidemia   . Hypertension   . PONV (postoperative nausea and vomiting)   . Sleep apnea    uses bipap machine  . Spinal headache    reports last spinal headache was yesterday 05-16-18, lasted for 2 hours a, relief with tylenol ,  denies vision change with yesterdays occurrence but wife endorses vision changes with past spinal headaches    . Type 2 DM with CKD stage 3 and hypertension ([place])     Past Surgical History:  Procedure Laterality Date  . CARDIAC CATHETERIZATION  06-15-14  . CATARACT EXTRACTION W/ INTRAOCULAR LENS  IMPLANT, BILATERAL  03/13/2003  . COLONOSCOPY    . CYSTOSCOPY W/ URETERAL STENT PLACEMENT Bilateral 01/08/2020   Procedure: CYSTOSCOPY WITH bilateral  RETROGRADE PYELOGRAM/ bilateral URETERAL STENT PLACEMENT;  Surgeon: Bjorn Loser, MD;  Location: [facility];  Service: Urology;  Laterality: Bilateral;  . CYSTOSCOPY WITH LITHOLAPAXY N/A 06/25/2013   Procedure: CYSTOSCOPY WITH LITHOLAPAXY;  Surgeon: [name], MD;  Location: [facility];  Service: Urology;  Laterality: N/A;  . CYSTOSCOPY/URETEROSCOPY/HOLMIUM LASER/STENT PLACEMENT Bilateral 01/30/2020   Procedure: CYSTOSCOPY BILATERAL URETEROSCOPY WITH STENT EXCHANGE;  Surgeon: [name], MD;  Location: [facility];  Service: Urology;  Laterality: Bilateral;  . KIDNEY STONE SURGERY  01/2020  . KNEE ARTHROSCOPY  09/07/2010   left knee  . KNEE ARTHROSCOPY  05/27/2000   left knee  . LEFT HEART CATHETERIZATION WITH CORONARY ANGIOGRAM N/A 06/17/2014   Procedure: LEFT HEART CATHETERIZATION WITH CORONARY ANGIOGRAM;  Surgeon: Laverda Page, MD;  Location: [facility] C[facility]  [facility];  Service: Cardiovascular;  Laterality: N/A;  . RETINAL DETACHMENT REPAIR W/ SCLERAL BUCKLE LE  10/14/2002   Dr Zadie Rhine  . TONSILLECTOMY     age 36  . TOTAL KNEE ARTHROPLASTY  08/01/2011   Procedure: TOTAL KNEE ARTHROPLASTY;  Surgeon: Lorn Junes, MD;  Location: [facility];  Service: Orthopedics;  Laterality: Right;  TOTAL KNEE ARTHROPLASTY  RIGHT SIDE  . TOTAL KNEE ARTHROPLASTY Left 05/22/2018   Procedure: LEFT TOTAL KNEE ARTHROPLASTY;  Surgeon: Paralee Cancel, MD;  Location: [facility];  Service: Orthopedics;  Laterality: Left;  . TRANSURETHRAL RESECTION OF PROSTATE N/A 06/25/2013   Procedure:  TRANSURETHRAL RESECTION OF THE PROSTATE WITH GYRUS INSTRUMENTS;  Surgeon: [name], MD;  Location: [facility];  Service: Urology;  Laterality: N/A;  . VASECTOMY  1982   Tobacco Use: Low Risk   . Smoking Tobacco Use: Never Smoker  . Smokeless Tobacco Use: Never Used  Marital [place]: Married   Review of Systems  Cardiovascular: Positive for leg swelling (ankles, stable). Negative for chest pain and dyspnea on exertion.  Respiratory: Positive for snoring.   Gastrointestinal: Negative for melena.   Objective:  Blood pressure 140/71, pulse 66, height 6' (1.829 m), weight 272 lb (123.4 kg), SpO2 95 %. Body mass index is 36.89 kg/m.   Vitals with BMI 09/17/2020 04/02/2020 03/02/2020  Height 6\' 0"  6\' 0"  6\' 1"   Weight 272 lbs 256 lbs 259 lbs  BMI 36.88 97.35 32.99  Systolic 242 683 419  Diastolic 71 70 54  Pulse 66 72 74      Physical Exam Vitals reviewed.  Constitutional:      General: He is not in acute distress.    Appearance: He is well-developed. He is obese.     Comments: Well built and moderately obese  Cardiovascular:     Rate and Rhythm: Normal rate and regular rhythm.     Pulses: Normal pulses and intact distal pulses.     Heart sounds: Normal heart sounds.     Comments: 2+ bilateral pitting leg edema JVD 5 CM  Pulmonary:     Effort: Pulmonary effort is normal. No accessory muscle usage or respiratory distress.     Breath sounds: Normal breath sounds.  Abdominal:     General: Bowel sounds are normal.     Palpations: Abdomen is soft.    Radiology: No results found.  Laboratory examination:   External labs: Lipid Panel completed 06/14/2018 HDL 43 MG/DL 06/14/2018 LDL 62.000 mg 06/14/2018 Cholesterol, total 129.000 m 06/14/2018 Triglycerides 122.000 06/14/2018  A1C 6.600 % 10/09/2019  Glucose Random 275.000 M 06/20/2019 MicroAlbumin Urine 28.000 06/21/2018 MicroAlbumin/Creat 24.4 MG/ 06/21/2018  BUN 21.000 M 06/20/2019 Creatinine, Serum 1.950 MG/ 06/20/2019  CMP  Latest Ref Rng & Units 01/23/2020 01/11/2020 01/10/2020  Glucose 70 - 99 mg/dL 191(H) 176(H) 154(H)  BUN 8 - 23 mg/dL 31(H) 54(H) 58(H)  Creatinine 0.61 - 1.24 mg/dL 2.13(H) 2.97(H) 4.10(H)  Sodium 135 - 145 mmol/L 139 140 135  Potassium 3.5 - 5.1 mmol/L 4.7 4.3 4.1  Chloride 98 - 111 mmol/L 106 107 107  CO2 22 - 32 mmol/L 25 23 19(L)  Calcium 8.9 - 10.3 mg/dL 9.0 9.0 8.4(L)  Total Protein 6.5 - 8.1 g/dL - - -  Total Bilirubin 0.3 - 1.2 mg/dL - - -  Alkaline Phos 38 - 126 U/L - - -  AST 15 - 41 U/L - - -  ALT 0 - 44 U/L - - -   CBC Latest Ref Rng &  Units 01/23/2020 01/10/2020 01/09/2020  WBC 4.0 - 10.5 K/uL 8.4 6.5 11.4(H)  Hemoglobin 13.0 - 17.0 g/dL 12.3(L) 13.0 13.2  Hematocrit 39.0 - 52.0 % 38.7(L) 39.6 39.7  Platelets 150 - 400 K/uL 244 149(L) 156    Lipid Panel     Component Value Date/Time   CHOL 119 11/12/2009 0856   TRIG 75.0 11/12/2009 0856   TRIG 118 06/29/2006 1325   HDL 47.00 11/12/2009 0856   CHOLHDL 3 11/12/2009 0856   VLDL 15.0 11/12/2009 0856   LDLCALC 57 11/12/2009 0856   LDLDIRECT 141.0 01/30/2007 1455   HEMOGLOBIN A1C Lab Results  Component Value Date   HGBA1C 7.4 (H) 01/08/2020   MPG 165.68 01/08/2020   TSH No results for input(s): TSH in the last 8760 hours.  Current Outpatient Medications on File Prior to Visit  Medication Sig Dispense Refill  . aspirin EC 81 MG tablet Take 81 mg by mouth daily.    Marland Kitchen atorvastatin (LIPITOR) 20 MG tablet Take 20 mg by mouth daily.    . Cholecalciferol (VITAMIN D) 2000 UNITS tablet Take 2,000 Units by mouth daily.    Marland Kitchen escitalopram (LEXAPRO) 20 MG tablet Take 20 mg by mouth daily.     . finasteride (PROSCAR) 5 MG tablet Take 5 mg by mouth daily.    . fluticasone (FLONASE) 50 MCG/ACT nasal spray Place 2 sprays into the nose as needed.    . gabapentin (NEURONTIN) 100 MG capsule Take 1 capsule by mouth daily as needed (nerve pain).     . magnesium oxide (MAG-OX) 400 MG tablet Take 400 mg by mouth daily.    . Omega-3  Fatty Acids (FISH OIL) 1000 MG CAPS Take 1,000 mg by mouth daily.    Marland Kitchen oxybutynin (DITROPAN-XL) 10 MG 24 hr tablet Take 10 mg by mouth at bedtime.     Marland Kitchen OZEMPIC, 0.25 OR 0.5 MG/DOSE, 2 MG/1.5ML SOPN     . OZEMPIC, 1 MG/DOSE, 2 MG/1.5ML SOPN Inject 1 mg into the skin once a week.    . solifenacin (VESICARE) 10 MG tablet Take 10 mg by mouth daily.    Marland Kitchen triamcinolone cream (KENALOG) 0.1 % Apply 1 application topically 2 (two) times daily.     No current facility-administered medications on file prior to visit.     Cardiac Studies:   Coronary Angiogram  [06/17/2014]: Mild noncritical coronary artery disease involving 10-20% stenosis, mid rca 20%, normal LV systolic function.  ABI's [05/14/2014]: Normal bilateral ABI at 1.11 with normal triphasic waveform at the level of the ankle.  Lexiscan (Walking with mod Bruce)Tetrofosmin Stress Test  10/09/2019: Nondiagnostic ECG stress. Resting EKG/ECG demonstrated normal sinus rhythm. Left ventricular strain patterns present.  Peak EKG/ECG revealed no significant ST-T change from baseline abnormality. There is a fixed moderate defect in the inferior and apical regions consistent with scar versus diaphragmatic attenuation. Global hypokinesis with stress LV EF: 35%. LV is mildly dilated in both rest and stress images.  No significant change from 04/21/2014.  Intermediate risk study due to low LVEF.   24-hour Holter monitor 10/02/2019: Normal sinus rhythm.  Rare PAC and PVC.  PVC occurred both in the form of isolated PVCs and in bigeminal pattern.  No atrial fibrillation was noted. No reported symptoms  Echocardiogram 09/01/2020:  Mildly depressed LV systolic function with visual EF 45-50%. Left ventricle cavity is normal in size. Hypokinetic global wall motion. Doppler evidence of grade I (impaired) diastolic dysfunction, normal LAP. Left atrial cavity is slightly dilated at 4.2  cm. Trileaflet aortic valve.  Mild (Grade I) aortic regurgitation. IVC is  dilated with a respiratory response of <50%. May suggest mild elevation in CVP. No significant change from 09/25/2019.   EKG:   EKG 09/17/2020: Sinus rhythm with first-degree AV block at the rate of 69 bpm, leftward enlargement, left axis deviation, left anterior fascicular block.  Poor R wave progression, cannot exclude anteroseptal infarct old.  IVCD, borderline criteria for LVH.  Nonspecific T abnormality, cannot exclude inferior ischemia. No significant change from EKG 03/02/2020.  Assessment:     ICD-10-CM   1. Acute diastolic heart failure (HCC)  I50.31 furosemide (LASIX) 40 MG tablet    Brain natriuretic peptide    Basic metabolic panel    Basic metabolic panel    Brain natriuretic peptide  2. Nonischemic cardiomyopathy (HCC)  I42.8 EKG 12-Lead    metoprolol tartrate 37.5 MG TABS  3. Frequent PVCs  I49.3   4. Primary hypertension  I10 metoprolol tartrate 37.5 MG TABS  5. Type 2 diabetes mellitus with stage 4 chronic kidney disease, with long-term current use of insulin (HCC)  E11.22    N18.4    Z79.4     Meds ordered this encounter  Medications  . metoprolol tartrate 37.5 MG TABS    Sig: Take 37.5 mg by mouth daily.    Dispense:  180 tablet    Refill:  3  . furosemide (LASIX) 40 MG tablet    Sig: Take 1 tablet (40 mg total) by mouth daily as needed (leg swelling).    Dispense:  30 tablet    Refill:  2   Medications Discontinued During This Encounter  Medication Reason  . doxycycline (VIBRA-TABS) 100 MG tablet Completed Course  . methocarbamol (ROBAXIN) 500 MG tablet Completed Course  . oxyCODONE-acetaminophen (PERCOCET/ROXICET) 5-325 MG tablet Completed Course  . trospium (SANCTURA) 20 MG tablet Completed Course  . Armodafinil 250 MG tablet Completed Course  . carvedilol (COREG) 12.5 MG tablet Change in therapy  . metoprolol tartrate (LOPRESSOR) 25 MG tablet Reorder  . nitroGLYCERIN (NITROSTAT) 0.4 MG SL tablet No longer needed (for PRN medications)    Orders  Placed This Encounter  Procedures  . Brain natriuretic peptide    Standing Status:   Standing    Number of Occurrences:   2    Standing Expiration Date:   09/17/2021  . Basic metabolic panel    Standing Status:   Standing    Number of Occurrences:   2    Standing Expiration Date:   09/17/2021  . EKG 12-Lead     Recommendations:   Keith Gatliff.  is a 75 y.o. male  with HTN, HLD, DM type 2, central sleep apnea, and stage 3 CKD, recently reevaluated by [place] for frequent PVCs seen during sleep study  echocardiogram in January 2021 revealing new cardiomyopathy with EF 40 to 45%, nuclear stress test confirmed nonischemic etiology with reduced EF at 35%.  Patient also has had normal coronary arteries by angiography in 2015.  24-hour Holter monitor revealed occasional PVCs.  He now presents for a 25-month office visit and follow-up.  He is markedly sedentary, to the holidays he has gained 6 pounds in weight, is presently in acute diastolic heart failure although denies complaints of any dyspnea, he has mild elevation in JVD and his leg edema is clearly 2+ and increased since last office visit.  I discussed with him regarding heart failure symptoms and signs.  We will start him on furosemide  40 mg to be taken on a as needed basis.  I will obtain a BMP and also BNP today and in 2 weeks again and advised to see him back in 4 weeks.  Strict instructions regarding heart failure symptoms and avoidance of salty food discussed with the patient.  His blood pressure is also elevated today, will increase his metoprolol tartrate from 25 mg twice daily to 37.5 mg twice daily.  He have stage IV chronic kidney disease and hence not on ACE inhibitor or ARB.  He has been compliant with his BiPAP for central sleep apnea.  No PVCs are noted on his EKG today.  I discontinued his nitroglycerin as he does not need it.  This was a 40-minute encounter    [name], MD, [facility] 09/17/2020, [place] AM Office: [phone] Pager:  [phone]

## 2020-09-18 LAB — BASIC METABOLIC PANEL
BUN/Creatinine Ratio: 15 (ref 10–24)
BUN: 31 mg/dL — ABNORMAL HIGH (ref 8–27)
CO2: 23 mmol/L (ref 20–29)
Calcium: 9.1 mg/dL (ref 8.6–10.2)
Chloride: 98 mmol/L (ref 96–106)
Creatinine, Ser: 2.12 mg/dL — ABNORMAL HIGH (ref 0.76–1.27)
GFR calc Af Amer: 35 mL/min/{1.73_m2} — ABNORMAL LOW (ref >59)
GFR calc non Af Amer: 30 mL/min/{1.73_m2} — ABNORMAL LOW (ref >59)
Glucose: 172 mg/dL — ABNORMAL HIGH (ref 65–99)
Potassium: 5 mmol/L (ref 3.5–5.2)
Sodium: 135 mmol/L (ref 134–144)

## 2020-10-01 DIAGNOSIS — I5032 Chronic diastolic (congestive) heart failure: Secondary | ICD-10-CM | POA: Diagnosis not present

## 2020-10-02 LAB — BRAIN NATRIURETIC PEPTIDE: BNP: 31.1 pg/mL (ref 0.0–100.0)

## 2020-10-05 ENCOUNTER — Ambulatory Visit (INDEPENDENT_AMBULATORY_CARE_PROVIDER_SITE_OTHER): Payer: Medicare Other | Admitting: Family Medicine

## 2020-10-05 ENCOUNTER — Encounter: Payer: Self-pay | Admitting: Family Medicine

## 2020-10-05 ENCOUNTER — Other Ambulatory Visit: Payer: Self-pay

## 2020-10-05 VITALS — BP 118/70 | HR 62 | Ht 72.0 in | Wt 264.2 lb

## 2020-10-05 DIAGNOSIS — G4730 Sleep apnea, unspecified: Secondary | ICD-10-CM | POA: Diagnosis not present

## 2020-10-05 NOTE — Progress Notes (Addendum)
Community message sent to [facility] at [facility] regarding new BiPAP orders

## 2020-10-05 NOTE — Patient Instructions (Signed)
Please continue using your BiPAP regularly. While your insurance requires that you use BiPAP at least 4 hours each night on 70% of the nights, I recommend, that you not skip any nights and use it throughout the night if you can. Getting used to BiPAP and staying with the treatment long term does take time and patience and discipline. Untreated obstructive sleep apnea when it is moderate to severe can have an adverse impact on cardiovascular health and raise her risk for heart disease, arrhythmias, hypertension, congestive heart failure, stroke and diabetes. Untreated obstructive sleep apnea causes sleep disruption, nonrestorative sleep, and sleep deprivation. This can have an impact on your day to day functioning and cause daytime sleepiness and impairment of cognitive function, memory loss, mood disturbance, and problems focussing. Using BiPAP regularly can improve these symptoms.   I will send mask refitting orders and supply orders today. Continue to monitor for leak at home.    Follow up in 6 months    CPAP and BPAP Information CPAP and BPAP are methods that use air pressure to keep your airways open and to help you breathe well. CPAP and BPAP use different amounts of pressure. Your health care provider will tell you whether CPAP or BPAP would be more helpful for you.  CPAP stands for "continuous positive airway pressure." With CPAP, the amount of pressure stays the same while you breathe in and out.  BPAP stands for "bi-level positive airway pressure." With BPAP, the amount of pressure will be higher when you breathe in (inhale) and lower when you breathe out(exhale). This allows you to take larger breaths. CPAP or BPAP may be used in the hospital, or your health care provider may want you to use it at home. You may need to have a sleep study before your health care provider can order a machine for you to use at home. Why are CPAP and BPAP treatments used? CPAP or BPAP can be helpful if you  have:  Sleep apnea.  Chronic obstructive pulmonary disease (COPD).  Heart failure.  Medical conditions that cause muscle weakness, including muscular dystrophy or amyotrophic lateral sclerosis (ALS).  Other problems that cause breathing to be shallow, weak, abnormal, or difficult. CPAP and BPAP are most commonly used for obstructive sleep apnea (OSA) to keep the airways from collapsing when the muscles relax during sleep. How is CPAP or BPAP administered? Both CPAP and BPAP are provided by a small machine with a flexible plastic tube that attaches to a plastic mask that you wear. Air is blown through the mask into your nose or mouth. The amount of pressure that is used to blow the air can be adjusted on the machine. Your health care provider will set the pressure setting and help you find the best mask for you. When should CPAP or BPAP be used? In most cases, the mask only needs to be worn during sleep. Generally, the mask needs to be worn throughout the night and during any daytime naps. People with certain medical conditions may also need to wear the mask at other times when they are awake. Follow instructions from your health care provider about when to use the machine. What are some tips for using the mask?  Because the mask needs to be snug, some people feel trapped or closed-in (claustrophobic) when first using the mask. If you feel this way, you may need to get used to the mask. One way to do this is to hold the mask loosely over your nose  or mouth and then gradually apply the mask more snugly. You can also gradually increase the amount of time that you use the mask.  Masks are available in various types and sizes. If your mask does not fit well, talk with your health care provider about getting a different one. Some common types of masks include: ? Full face masks, which fit over the mouth and nose. ? Nasal masks, which fit over the nose. ? Nasal pillow or prong masks, which fit into  the nostrils.  If you are using a mask that fits over your nose and you tend to breathe through your mouth, a chin strap may be applied to help keep your mouth closed.  Some CPAP and BPAP machines have alarms that may sound if the mask comes off or develops a leak.  If you have trouble with the mask, it is very important that you talk with your health care provider about finding a way to make the mask easier to tolerate. Do not stop using the mask. There could be a negative impact to your health if you stop using the mask.   What are some tips for using the machine?  Place your CPAP or BPAP machine on a secure table or stand near an electrical outlet.  Know where the on/off switch is on the machine.  Follow instructions from your health care provider about how to set the pressure on your machine and when you should use it.  Do not eat or drink while the CPAP or BPAP machine is on. Food or fluids could get pushed into your lungs by the pressure of the CPAP or BPAP.  For home use, CPAP and BPAP machines can be rented or purchased through home health care companies. Many different brands of machines are available. Renting a machine before purchasing may help you find out which particular machine works well for you. Your insurance may also decide which machine you may get.  Keep the CPAP or BPAP machine and attachments clean. Ask your health care provider for specific instructions. Follow these instructions at home:  Do not use any products that contain nicotine or tobacco, such as cigarettes, e-cigarettes, and chewing tobacco. If you need help quitting, ask your health care provider.  Keep all follow-up visits as told by your health care provider. This is important. Contact a health care provider if:  You have redness or pressure sores on your head, face, mouth, or nose from the mask or head gear.  You have trouble using the CPAP or BPAP machine.  You cannot tolerate wearing the CPAP or  BPAP mask.  Someone tells you that you snore even when wearing your CPAP or BPAP. Get help right away if:  You have trouble breathing.  You feel confused. Summary  CPAP and BPAP are methods that use air pressure to keep your airways open and to help you breathe well.  You may need to have a sleep study before your health care provider can order a machine for home use.  If you have trouble with the mask, it is very important that you talk with your health care provider about finding a way to make the mask easier to tolerate. Do not stop using the mask. There could be a negative impact to your health if you stop using the mask.  Follow instructions from your health care provider about when to use the machine. This information is not intended to replace advice given to you by your health care  provider. Make sure you discuss any questions you have with your health care provider. Document Revised: 09/20/2019 Document Reviewed: 09/23/2019 Elsevier Patient Education  2021 [employer].

## 2020-10-05 NOTE — Progress Notes (Addendum)
PATIENT: Keith [name]. DOB: [birth year]  REASON FOR VISIT: follow up HISTORY FROM: patient  Chief Complaint  Patient presents with  . Follow-up    RM 2 alone Pt is well, BiPAP needs a new strap but works great when it does work     HISTORY OF PRESENT ILLNESS: Today 10/05/20 Keith Mathenia. is a 75 y.o. male here today for follow up for OSA on BiHe has continued to work on BiPAP compliance.  He states that BiPAP works well most days.  He has attempted to reach out to his DME company to request a new mask and headgear.  Current headgear is causing irritation and a rash to the back of his head.  He is having to use padding and has duct tape to hold headgear in place.  He reports being told about 6 months ago that there were no available mask or headgear to send at that time.  He has not heard anything since.  He is becoming more accustomed to using BiPAP therapy.  He admits that there are days where he does not use it for a full 4 hours.  Compliance report dated 09/06/2020 through 10/05/2020 reveals that he used BiPAP 27 of the past 30 days for compliance of 90%.  He used BiPAP greater than 4 hours 20 of the past 30 days for compliance of 67%.  Average usage on days used was 6 hours and 2 minutes.  Residual AHI was 6.6 with IPAP of 19 and EPAP of 15 cm of water.  Respiratory rate of 12.  Leak has improved and now in the 95th percentile of 30.9 L/min.   HISTORY: (copied from previous notes)  Today 04/02/20 ALL: Keith [name]. is a 75 y.o. male here today for follow up for complex sleep apnea treated with BiPAP. He is using BiPAP most night. He continues to note a leak. He is happy with his full face mask but reports his head gear is loose. It has not been changed in many months. He denies concerns with BiPAP machine.   Complaince report dated 03/03/2020-04/01/2020 reveals daily compliance of 93% and four hour compliance of 90%. Residual AHI 5.5 on IPAP 19cmH20 and EPAP of 15cmH20.  Leak noted in the 95% of 67.3L/min.  To 3  HISTORY: (copied from my note on 12/18/2019)  Keith Harmonis a 75 y.o.malehere today for follow up for primary central sleep apnea recently restarted on BiPAP therapy. Sleep study in 07/2019 revealed "severe central and obstructive sleep apnea, with a primary central component, total AHI of 47.7/hour, O2 nadir of 79%".Titration study confirmed adequate treatment with BiPAP therapy.He is doing well on BiPAP therapy. He does feel that he is sleeping better. He has noted a leak in his mask. He is currently using a full facemask. He does like the mask he is using and is willing to continue adjusting his headgear to ensure appropriate fit of mask. He does see cardiology regularly. Lexiscan stress test on 10/09/2019 was stable from 2015. Holter monitor on 10/02/2019 did reveal rare PACs and PVCs both in isolated and bigeminal pattern. Atrial fibrillation was not noted.  Compliance report dated 11/18/2019 3 12/17/2019 reveals that he has used BiPAP therapy 28 of the last 30 days for compliance of 93%. He used BiPAP greater than 4 hours 26 of the last 30 days for compliance of 87%. Average usage was 7 hours and 9 minutes. Residual AHI was 5.0 with IPAP of 19  cm of water and EPAP of 15 cm of water.   HISTORY: (copied fromDr Athar'snote on 07/03/2019)  Dear Dr. Philip Aspen, I saw your patient, Keith [name], upon your kind request in my sleep clinic today for reevaluation of his obstructive sleep apnea, on treatment with BiPAP. The patient is accompanied by his wife today. I have previously followed him for his obstructive sleep apnea and recurrent headaches. I last saw him over 3 years ago at which time he was compliant with his BiPAP ST and he was taking Topamax low-dose twice daily for headache prevention.  As you know, Mr. Coon is a 75 year old right-handed gentleman with an underlying medical history of type 2 diabetes, osteoarthritis,  hyperlipidemia, depression, allergic rhinitis, kidney stones, recurrent headaches, status post retinal detachment surgery, status post right, then leftknee replacements, status post TURP, and obesity, who has been on BiPAP ST for his sleep apnea for years. He had sleep study testing through our office on 04/29/2014 which was a split-night sleep study and a subsequent titration study in September 2015. I reviewed his BiPAP compliance data. Out of the last 30 days he used his machine only 12 days, residual AHI around 8.1/h. Average usage for days on treatment of 4 hours and 10 minutes, leak on the high side with a 95th percentile at 55 L/min, pressure of 20/16 with a backup rate of 12. In the past 90 days, he used his machine 32 days. Residual AHI around 8.4. He reports that he is trying to get back on the machine, he finds it uncomfortable. He uses a full facemask. In the past 9 days he has consistently tried to use his machine. His wife reports that he does not snore when he sleeps with the BiPAP and seems to be less restless, less twitching noted by wife. He feels that the settings are not optimal, he had stopped using his machine because he had been able to lose weight but he gained some weight back. When he started gaining weight again, his wife noticed recurrence of snoring. He would be willing to get retested for his sleep apnea and consider consistently using a CPAP or BiPAP machine. He would be eligible for a new machine since his set up date was 05/12/2014. His Epworth sleepiness score is 9 out of 24, fatigue severity score is 62 out of 63. His sleep is interrupted. He has nocturia about once per average night. He generally goes to bed around 10, rise time is between 8 and 9. He has to get up at 4 to let the 2 dogs out, then he has to feed him at 5 AM. He has had morning headaches at times. He has limited his caffeine intake to 1 cup of chai tea per day, occasional soda or occasional ice  tea. He does not smoke cigarettes, he smokes a cigar a month approximately. He drinks alcohol rarely.    REVIEW OF SYSTEMS: Out of a complete 14 system review of symptoms, the patient complains only of the following symptoms, none and all other reviewed systems are negative.  ESS: 7 FSS: 35  ALLERGIES: Allergies  Allergen Reactions  . Penicillins Anaphylaxis    eyes swell shut  Has patient had a PCN reaction causing immediate rash, facial/tongue/throat swelling, SOB or lightheadedness with hypotension: yes Has patient had a PCN reaction causing severe rash involving mucus membranes or skin necrosis: no Has patient had a PCN reaction that required hospitalization no Has patient had a PCN reaction occurring within the last  10 years: no If all of the above answers are "NO", then may proceed with Cephalosporin use.  Judeth Cornfield Reductase Inhibitors     HOME MEDICATIONS: Outpatient Medications Prior to Visit  Medication Sig Dispense Refill  . aspirin EC 81 MG tablet Take 81 mg by mouth daily.    Marland Kitchen atorvastatin (LIPITOR) 20 MG tablet Take 20 mg by mouth daily.    . Cholecalciferol (VITAMIN D) 2000 UNITS tablet Take 2,000 Units by mouth daily.    Marland Kitchen donepezil (ARICEPT) 5 MG tablet Take 5 mg by mouth at bedtime.    Marland Kitchen escitalopram (LEXAPRO) 20 MG tablet Take 20 mg by mouth daily.     . finasteride (PROSCAR) 5 MG tablet Take 5 mg by mouth daily.    . furosemide (LASIX) 40 MG tablet Take 1 tablet (40 mg total) by mouth daily as needed (leg swelling). 30 tablet 2  . gabapentin (NEURONTIN) 100 MG capsule Take 1 capsule by mouth daily as needed (nerve pain).     . magnesium oxide (MAG-OX) 400 MG tablet Take 400 mg by mouth daily.    . metoprolol tartrate 37.5 MG TABS Take 37.5 mg by mouth daily. 180 tablet 3  . Omega-3 Fatty Acids (FISH OIL) 1000 MG CAPS Take 1,000 mg by mouth daily.    Marland Kitchen oxybutynin (DITROPAN-XL) 10 MG 24 hr tablet Take 10 mg by mouth at bedtime.     Marland Kitchen OZEMPIC, 0.25 OR 0.5  MG/DOSE, 2 MG/1.5ML SOPN     . OZEMPIC, 1 MG/DOSE, 2 MG/1.5ML SOPN Inject 1 mg into the skin once a week.    . solifenacin (VESICARE) 10 MG tablet Take 10 mg by mouth daily.    Marland Kitchen triamcinolone cream (KENALOG) 0.1 % Apply 1 application topically 2 (two) times daily.     No facility-administered medications prior to visit.    PAST MEDICAL HISTORY: Past Medical History:  Diagnosis Date  . Anginal pain ([place])   . Anxiety    takes Celexa  . Arthritis    bil knees  . Benign prostatic hypertrophy    takes Flomax daily  . Bladder stones 06/25/2013   1.9cm bladder stone.  Marland Kitchen BPH with urinary obstruction 06/25/2013   127cc prostate with large middle lobe and bladder stones.   . Cardiomyopathy ([place])   . CHF (congestive heart failure) ([place])   . Complication of anesthesia    hard to wake up  . Depression   . Diabetes mellitus without complication ([place])   . Dysrhythmia   . ED (erectile dysfunction)   . History of kidney stones   . Hyperlipidemia   . Hypertension   . PONV (postoperative nausea and vomiting)   . Sleep apnea    uses bipap machine  . Spinal headache    reports last spinal headache was yesterday 05-16-18, lasted for 2 hours a, relief with tylenol , denies vision change with yesterdays occurrence but wife endorses vision changes with past spinal headaches    . Type 2 DM with CKD stage 3 and hypertension ([place])     PAST SURGICAL HISTORY: Past Surgical History:  Procedure Laterality Date  . CARDIAC CATHETERIZATION  06-15-14  . CATARACT EXTRACTION W/ INTRAOCULAR LENS  IMPLANT, BILATERAL  03/13/2003  . COLONOSCOPY    . CYSTOSCOPY W/ URETERAL STENT PLACEMENT Bilateral 01/08/2020   Procedure: CYSTOSCOPY WITH bilateral  RETROGRADE PYELOGRAM/ bilateral URETERAL STENT PLACEMENT;  Surgeon: Bjorn Loser, MD;  Location: [facility];  Service: Urology;  Laterality: Bilateral;  . CYSTOSCOPY WITH  LITHOLAPAXY N/A 06/25/2013   Procedure: CYSTOSCOPY WITH LITHOLAPAXY;  Surgeon: [name], MD;   Location: [facility];  Service: Urology;  Laterality: N/A;  . CYSTOSCOPY/URETEROSCOPY/HOLMIUM LASER/STENT PLACEMENT Bilateral 01/30/2020   Procedure: CYSTOSCOPY BILATERAL URETEROSCOPY WITH STENT EXCHANGE;  Surgeon: [name], MD;  Location: [facility];  Service: Urology;  Laterality: Bilateral;  . KIDNEY STONE SURGERY  01/2020  . KNEE ARTHROSCOPY  09/07/2010   left knee  . KNEE ARTHROSCOPY  05/27/2000   left knee  . LEFT HEART CATHETERIZATION WITH CORONARY ANGIOGRAM N/A 06/17/2014   Procedure: LEFT HEART CATHETERIZATION WITH CORONARY ANGIOGRAM;  Surgeon: Laverda Page, MD;  Location: [facility];  Service: Cardiovascular;  Laterality: N/A;  . RETINAL DETACHMENT REPAIR W/ SCLERAL BUCKLE LE  10/14/2002   Dr Zadie Rhine  . TONSILLECTOMY     age 27  . TOTAL KNEE ARTHROPLASTY  08/01/2011   Procedure: TOTAL KNEE ARTHROPLASTY;  Surgeon: Lorn Junes, MD;  Location: [facility];  Service: Orthopedics;  Laterality: Right;  TOTAL KNEE ARTHROPLASTY  RIGHT SIDE  . TOTAL KNEE ARTHROPLASTY Left 05/22/2018   Procedure: LEFT TOTAL KNEE ARTHROPLASTY;  Surgeon: Paralee Cancel, MD;  Location: [facility];  Service: Orthopedics;  Laterality: Left;  . TRANSURETHRAL RESECTION OF PROSTATE N/A 06/25/2013   Procedure: TRANSURETHRAL RESECTION OF THE PROSTATE WITH GYRUS INSTRUMENTS;  Surgeon: [name], MD;  Location: [facility];  Service: Urology;  Laterality: N/A;  . VASECTOMY  1982    FAMILY HISTORY: Family History  Problem Relation Age of Onset  . Hypertension Mother   . Heart failure Mother   . Heart failure Father   . Arthritis Other   . Cancer Other        breast, skin  . Coronary artery disease Other   . Heart disease Other   . Anesthesia problems Maternal Grandmother   . Hypotension Neg Hx   . Malignant hyperthermia Neg Hx   . Pseudochol deficiency Neg Hx     SOCIAL HISTORY: Social History   Socioeconomic History  . Marital status: Married    Spouse name: Not on file  . Number of children: 2  . Years of  education: Not on file  . Highest education level: Not on file  Occupational History  . Not on file  Tobacco Use  . Smoking status: Never Smoker  . Smokeless tobacco: Never Used  Vaping Use  . Vaping Use: Never used  Substance and Sexual Activity  . Alcohol use: Yes    Alcohol/week: 0.0 standard drinks    Comment: rare   . Drug use: No  . Sexual activity: Not Currently  Other Topics Concern  . Not on file  Social History Narrative   Right handed.    Social Determinants of Health   Financial Resource Strain: Not on file  Food Insecurity: Not on file  Transportation Needs: Not on file  Physical Activity: Not on file  Stress: Not on file  Social Connections: Not on file  Intimate Partner Violence: Not on file     PHYSICAL EXAM  Vitals:   10/05/20 1016  BP: 118/70  Pulse: 62  Weight: 264 lb 3.2 oz (119.8 kg)  Height: 6' (1.829 m)   Body mass index is 35.83 kg/m.  Generalized: Well developed, in no acute distress  Cardiology: normal rate and rhythm, no murmur noted Respiratory: clear to auscultation bilaterally  Neurological examination  Mentation: Alert oriented to time, place, history taking. Follows all commands speech and language fluent Cranial nerve II-XII: Pupils  were equal round reactive to light. Extraocular movements were full, visual field were full  Motor: The motor testing reveals 5 over 5 strength of all 4 extremities. Good symmetric motor tone is noted throughout.  Gait and station: Gait is normal.    DIAGNOSTIC DATA (LABS, IMAGING, TESTING) - I reviewed patient records, labs, notes, testing and imaging myself where available.  No flowsheet data found.   Lab Results  Component Value Date   WBC 8.4 01/23/2020   HGB 12.3 (L) 01/23/2020   HCT 38.7 (L) 01/23/2020   MCV 95.1 01/23/2020   PLT 244 01/23/2020      Component Value Date/Time   NA 135 09/17/2020 1149   K 5.0 09/17/2020 1149   CL 98 09/17/2020 1149   CO2 23 09/17/2020 1149    GLUCOSE 172 (H) 09/17/2020 1149   GLUCOSE 191 (H) 01/23/2020 1428   GLUCOSE 139 (H) 06/29/2006 1325   BUN 31 (H) 09/17/2020 1149   CREATININE 2.12 (H) 09/17/2020 1149   CALCIUM 9.1 09/17/2020 1149   PROT 6.6 01/08/2020 1546   ALBUMIN 3.4 (L) 01/08/2020 1546   AST 14 (L) 01/08/2020 1546   ALT 14 01/08/2020 1546   ALKPHOS 69 01/08/2020 1546   BILITOT 0.6 01/08/2020 1546   GFRNONAA 30 (L) 09/17/2020 1149   GFRAA 35 (L) 09/17/2020 1149   Lab Results  Component Value Date   CHOL 119 11/12/2009   HDL 47.00 11/12/2009   LDLCALC 57 11/12/2009   LDLDIRECT 141.0 01/30/2007   TRIG 75.0 11/12/2009   CHOLHDL 3 11/12/2009   Lab Results  Component Value Date   HGBA1C 7.4 (H) 01/08/2020   No results found for: VITAMINB12 Lab Results  Component Value Date   TSH 1.90 08/19/2010     ASSESSMENT AND PLAN 75 y.o. year old male  has a past medical history of Anginal pain ([place]), Anxiety, Arthritis, Benign prostatic hypertrophy, Bladder stones (06/25/2013), BPH with urinary obstruction (06/25/2013), Cardiomyopathy ([place]), CHF (congestive heart failure) ([place]), Complication of anesthesia, Depression, Diabetes mellitus without complication ([place]), Dysrhythmia, ED (erectile dysfunction), History of kidney stones, Hyperlipidemia, Hypertension, PONV (postoperative nausea and vomiting), Sleep apnea, Spinal headache, and Type 2 DM with CKD stage 3 and hypertension ([place]). here with     ICD-10-CM   1. Sleep apnea treated with nocturnal BiPAP  G47.30 For home use only DME Bipap    For home use only DME Bipap     Thelonious Kauffmann. is doing well on BiPAP therapy. Compliance report reveals excellent daily compliance but suboptimal 4-hour compliance.  He has reached out to his DME company for replacement mask and headgear, however, reports that he was told there were none in stock.  I will send orders today for a mask refitting and reeducation for appropriate fit of mask and how to monitor for leak at home.  Leak  has improved since last being seen.  We will continue to monitor closely. He was encouraged to continue using BiPAP nightly and for greater than 4 hours each night. We will update supply orders as indicated. Risks of untreated sleep apnea review and education materials provided. Healthy lifestyle habits encouraged. He will follow up in 6 months, sooner if needed. He verbalizes understanding and agreement with this plan.   Orders Placed This Encounter  Procedures  . For home use only DME Bipap    Mask refitting    Order Specific Question:   Length of Need    Answer:   Lifetime  Order Specific Question:   Inspiratory pressure    Answer:   OTHER SEE COMMENTS    Order Specific Question:   Expiratory pressure    Answer:   OTHER SEE COMMENTS  . For home use only DME Bipap    Patient needs reeducation on proper mask fit and how to monitor for leak at home.    Order Specific Question:   Length of Need    Answer:   Lifetime    Order Specific Question:   Inspiratory pressure    Answer:   OTHER SEE COMMENTS    Order Specific Question:   Expiratory pressure    Answer:   OTHER SEE COMMENTS     No orders of the defined types were placed in this encounter.     I spent 15 minutes with the patient. 50% of this time was spent counseling and educating patient on plan of care and medications.    Debbora Presto, FNP-C 10/05/2020, 10:40 AM [facility] [address] [place], [place] [address] [phone]   I reviewed the above note and documentation by the Nurse Practitioner and agree with the history, exam, assessment and plan as outlined above. I was available for consultation. Star Age, MD, PhD [facility])

## 2020-10-06 NOTE — Progress Notes (Addendum)
Message received 10/05/20: "Thank you, will process.   Janett Billow"      Message received 10/26/20: "Just a heads up - we were unable to reach this patient, on several occasions, for a mask fitting.   We have voided the order on our end - but if patient decides to return our calls we will gladly get him scheduled.   Thank you!  Christina Soldano  PAP, EMR Intake Manager  [place] [facility]"

## 2020-10-12 DIAGNOSIS — E1121 Type 2 diabetes mellitus with diabetic nephropathy: Secondary | ICD-10-CM | POA: Diagnosis not present

## 2020-10-12 DIAGNOSIS — I2510 Atherosclerotic heart disease of native coronary artery without angina pectoris: Secondary | ICD-10-CM | POA: Diagnosis not present

## 2020-10-12 DIAGNOSIS — I10 Essential (primary) hypertension: Secondary | ICD-10-CM | POA: Diagnosis not present

## 2020-10-12 DIAGNOSIS — E785 Hyperlipidemia, unspecified: Secondary | ICD-10-CM | POA: Diagnosis not present

## 2020-10-12 DIAGNOSIS — N3281 Overactive bladder: Secondary | ICD-10-CM | POA: Diagnosis not present

## 2020-10-12 DIAGNOSIS — R6889 Other general symptoms and signs: Secondary | ICD-10-CM | POA: Diagnosis not present

## 2020-10-12 DIAGNOSIS — G4733 Obstructive sleep apnea (adult) (pediatric): Secondary | ICD-10-CM | POA: Diagnosis not present

## 2020-10-14 NOTE — Progress Notes (Deleted)
Primary Physician:  Leanna Battles, MD   Patient ID: Keith Drain., male    DOB: [birth year], 75 y.o.   MRN: [id number]  Subjective:    No chief complaint on file.   HPI: Keith [name].  is a 75 y.o. male  with HTN, HLD, DM type 2, central sleep apnea, and stage 3 CKD,  frequent PVCs seen during sleep study  echocardiogram in January 2021 revealing new cardiomyopathy with EF 40 to 45%, nuclear stress test confirmed nonischemic etiology with reduced EF at 35%.  Patient also has had normal coronary arteries by angiography in 2015.  24-hour Holter monitor revealed occasional PVCs. This is a 6 month OV.   Patient is without complaints today. Denies any symptoms of chest pain or dyspnea on exertion.   He is sedentary.  He is on BiPAP for sleep apnea and is been compliant.   States that his diabetes has now improved and 3 months ago states that his A1c was around 6.7%.  He has gained about 10 pounds in weight over the holidays.  He has also noticed worsening leg edema.  No PND or orthopnea.  ***Patient presents for 4 week follow up of heart failure. At last visit started patient on furosemide 40 mg daily as needed with repeat BMP and BNP in 2 weeks. Patient's blood pressure was also elevated at last visit, increased metoprolol tartrate to 37.5 mg twice daily. BNP on 10/01/20 was 31.   Past Medical History:  Diagnosis Date  . Anginal pain ([place])   . Anxiety    takes Celexa  . Arthritis    bil knees  . Benign prostatic hypertrophy    takes Flomax daily  . Bladder stones 06/25/2013   1.9cm bladder stone.  Marland Kitchen BPH with urinary obstruction 06/25/2013   127cc prostate with large middle lobe and bladder stones.   . Cardiomyopathy ([place])   . CHF (congestive heart failure) ([place])   . Complication of anesthesia    hard to wake up  . Depression   . Diabetes mellitus without complication ([place])   . Dysrhythmia   . ED (erectile dysfunction)   . History of kidney stones   . Hyperlipidemia    . Hypertension   . PONV (postoperative nausea and vomiting)   . Sleep apnea    uses bipap machine  . Spinal headache    reports last spinal headache was yesterday 05-16-18, lasted for 2 hours a, relief with tylenol , denies vision change with yesterdays occurrence but wife endorses vision changes with past spinal headaches    . Type 2 DM with CKD stage 3 and hypertension ([place])     Past Surgical History:  Procedure Laterality Date  . CARDIAC CATHETERIZATION  06-15-14  . CATARACT EXTRACTION W/ INTRAOCULAR LENS  IMPLANT, BILATERAL  03/13/2003  . COLONOSCOPY    . CYSTOSCOPY W/ URETERAL STENT PLACEMENT Bilateral 01/08/2020   Procedure: CYSTOSCOPY WITH bilateral  RETROGRADE PYELOGRAM/ bilateral URETERAL STENT PLACEMENT;  Surgeon: Bjorn Loser, MD;  Location: [facility];  Service: Urology;  Laterality: Bilateral;  . CYSTOSCOPY WITH LITHOLAPAXY N/A 06/25/2013   Procedure: CYSTOSCOPY WITH LITHOLAPAXY;  Surgeon: [name], MD;  Location: [facility];  Service: Urology;  Laterality: N/A;  . CYSTOSCOPY/URETEROSCOPY/HOLMIUM LASER/STENT PLACEMENT Bilateral 01/30/2020   Procedure: CYSTOSCOPY BILATERAL URETEROSCOPY WITH STENT EXCHANGE;  Surgeon: [name], MD;  Location: [facility];  Service: Urology;  Laterality: Bilateral;  . KIDNEY STONE SURGERY  01/2020  . KNEE ARTHROSCOPY  09/07/2010  left knee  . KNEE ARTHROSCOPY  05/27/2000   left knee  . LEFT HEART CATHETERIZATION WITH CORONARY ANGIOGRAM N/A 06/17/2014   Procedure: LEFT HEART CATHETERIZATION WITH CORONARY ANGIOGRAM;  Surgeon: Laverda Page, MD;  Location: [facility] CATH LAB;  Service: Cardiovascular;  Laterality: N/A;  . RETINAL DETACHMENT REPAIR W/ SCLERAL BUCKLE LE  10/14/2002   Dr Zadie Rhine  . TONSILLECTOMY     age 19  . TOTAL KNEE ARTHROPLASTY  08/01/2011   Procedure: TOTAL KNEE ARTHROPLASTY;  Surgeon: Lorn Junes, MD;  Location: [place];  Service: Orthopedics;  Laterality: Right;  TOTAL KNEE ARTHROPLASTY  RIGHT SIDE  . TOTAL KNEE ARTHROPLASTY Left  05/22/2018   Procedure: LEFT TOTAL KNEE ARTHROPLASTY;  Surgeon: Paralee Cancel, MD;  Location: [facility];  Service: Orthopedics;  Laterality: Left;  . TRANSURETHRAL RESECTION OF PROSTATE N/A 06/25/2013   Procedure: TRANSURETHRAL RESECTION OF THE PROSTATE WITH GYRUS INSTRUMENTS;  Surgeon: [name], MD;  Location: [facility];  Service: Urology;  Laterality: N/A;  . VASECTOMY  1982   Tobacco Use: Low Risk   . Smoking Tobacco Use: Never Smoker  . Smokeless Tobacco Use: Never Used  Marital [place]: Married   Review of Systems  Cardiovascular: Positive for leg swelling (ankles, stable). Negative for chest pain and dyspnea on exertion.  Respiratory: Positive for snoring.   Gastrointestinal: Negative for melena.   Objective:  There were no vitals taken for this visit. There is no height or weight on file to calculate BMI.   Vitals with BMI 10/05/2020 09/17/2020 04/02/2020  Height 6\' 0"  6\' 0"  6\' 0"   Weight 264 lbs 3 oz 272 lbs 256 lbs  BMI 35.82 70.01 74.94  Systolic 496 759 163  Diastolic 70 71 70  Pulse 62 66 72      Physical Exam Vitals reviewed.  Constitutional:      General: He is not in acute distress.    Appearance: He is well-developed. He is obese.     Comments: Well built and moderately obese  Cardiovascular:     Rate and Rhythm: Normal rate and regular rhythm.     Pulses: Normal pulses and intact distal pulses.     Heart sounds: Normal heart sounds.     Comments: 2+ bilateral pitting leg edema JVD 5 CM  Pulmonary:     Effort: Pulmonary effort is normal. No accessory muscle usage or respiratory distress.     Breath sounds: Normal breath sounds.  Abdominal:     General: Bowel sounds are normal.     Palpations: Abdomen is soft.     Laboratory examination:   External labs: Lipid Panel completed 06/14/2018 HDL 43 MG/DL 06/14/2018 LDL 62.000 mg 06/14/2018 Cholesterol, total 129.000 m 06/14/2018 Triglycerides 122.000 06/14/2018  A1C 6.600 % 10/09/2019  Glucose Random 275.000 M  06/20/2019 MicroAlbumin Urine 28.000 06/21/2018 MicroAlbumin/Creat 24.4 MG/ 06/21/2018  BUN 21.000 M 06/20/2019 Creatinine, Serum 1.950 MG/ 06/20/2019  CMP Latest Ref Rng & Units 09/17/2020 01/23/2020 01/11/2020  Glucose 65 - 99 mg/dL 172(H) 191(H) 176(H)  BUN 8 - 27 mg/dL 31(H) 31(H) 54(H)  Creatinine 0.76 - 1.27 mg/dL 2.12(H) 2.13(H) 2.97(H)  Sodium 134 - 144 mmol/L 135 139 140  Potassium 3.5 - 5.2 mmol/L 5.0 4.7 4.3  Chloride 96 - 106 mmol/L 98 106 107  CO2 20 - 29 mmol/L 23 25 23   Calcium 8.6 - 10.2 mg/dL 9.1 9.0 9.0  Total Protein 6.5 - 8.1 g/dL - - -  Total Bilirubin 0.3 - 1.2 mg/dL - - -  Alkaline Phos 38 - 126 U/L - - -  AST 15 - 41 U/L - - -  ALT 0 - 44 U/L - - -   CBC Latest Ref Rng & Units 01/23/2020 01/10/2020 01/09/2020  WBC 4.0 - 10.5 K/uL 8.4 6.5 11.4(H)  Hemoglobin 13.0 - 17.0 g/dL 12.3(L) 13.0 13.2  Hematocrit 39.0 - 52.0 % 38.7(L) 39.6 39.7  Platelets 150 - 400 K/uL 244 149(L) 156    Lipid Panel     Component Value Date/Time   CHOL 119 11/12/2009 0856   TRIG 75.0 11/12/2009 0856   TRIG 118 06/29/2006 1325   HDL 47.00 11/12/2009 0856   CHOLHDL 3 11/12/2009 0856   VLDL 15.0 11/12/2009 0856   LDLCALC 57 11/12/2009 0856   LDLDIRECT 141.0 01/30/2007 1455   HEMOGLOBIN A1C Lab Results  Component Value Date   HGBA1C 7.4 (H) 01/08/2020   MPG 165.68 01/08/2020   TSH No results for input(s): TSH in the last 8760 hours.  BNP    Component Value Date/Time   BNP 31.1 10/01/2020 1505    ProBNP No results found for: PROBNP   Allergies and Medications    Allergies  Allergen Reactions  . Penicillins Anaphylaxis    eyes swell shut  Has patient had a PCN reaction causing immediate rash, facial/tongue/throat swelling, SOB or lightheadedness with hypotension: yes Has patient had a PCN reaction causing severe rash involving mucus membranes or skin necrosis: no Has patient had a PCN reaction that required hospitalization no Has patient had a PCN reaction occurring  within the last 10 years: no If all of the above answers are "NO", then may proceed with Cephalosporin use.  Judeth Cornfield Reductase Inhibitors      Current Outpatient Medications on File Prior to Visit  Medication Sig Dispense Refill  . aspirin EC 81 MG tablet Take 81 mg by mouth daily.    Marland Kitchen atorvastatin (LIPITOR) 20 MG tablet Take 20 mg by mouth daily.    . Cholecalciferol (VITAMIN D) 2000 UNITS tablet Take 2,000 Units by mouth daily.    Marland Kitchen donepezil (ARICEPT) 5 MG tablet Take 5 mg by mouth at bedtime.    Marland Kitchen escitalopram (LEXAPRO) 20 MG tablet Take 20 mg by mouth daily.     . finasteride (PROSCAR) 5 MG tablet Take 5 mg by mouth daily.    . furosemide (LASIX) 40 MG tablet Take 1 tablet (40 mg total) by mouth daily as needed (leg swelling). 30 tablet 2  . gabapentin (NEURONTIN) 100 MG capsule Take 1 capsule by mouth daily as needed (nerve pain).     . magnesium oxide (MAG-OX) 400 MG tablet Take 400 mg by mouth daily.    . metoprolol tartrate 37.5 MG TABS Take 37.5 mg by mouth daily. 180 tablet 3  . Omega-3 Fatty Acids (FISH OIL) 1000 MG CAPS Take 1,000 mg by mouth daily.    Marland Kitchen oxybutynin (DITROPAN-XL) 10 MG 24 hr tablet Take 10 mg by mouth at bedtime.     Marland Kitchen OZEMPIC, 0.25 OR 0.5 MG/DOSE, 2 MG/1.5ML SOPN     . OZEMPIC, 1 MG/DOSE, 2 MG/1.5ML SOPN Inject 1 mg into the skin once a week.    . solifenacin (VESICARE) 10 MG tablet Take 10 mg by mouth daily.    Marland Kitchen triamcinolone cream (KENALOG) 0.1 % Apply 1 application topically 2 (two) times daily.     No current facility-administered medications on file prior to visit.     Radiology    No results found.  Cardiac Studies:   Coronary Angiogram  [06/17/2014]: Mild noncritical coronary artery disease involving 10-20% stenosis, mid rca 20%, normal LV systolic function.  ABI's [05/14/2014]: Normal bilateral ABI at 1.11 with normal triphasic waveform at the level of the ankle.  Lexiscan (Walking with mod Bruce)Tetrofosmin Stress Test   10/09/2019: Nondiagnostic ECG stress. Resting EKG/ECG demonstrated normal sinus rhythm. Left ventricular strain patterns present.  Peak EKG/ECG revealed no significant ST-T change from baseline abnormality. There is a fixed moderate defect in the inferior and apical regions consistent with scar versus diaphragmatic attenuation. Global hypokinesis with stress LV EF: 35%. LV is mildly dilated in both rest and stress images.  No significant change from 04/21/2014.  Intermediate risk study due to low LVEF.   24-hour Holter monitor 10/02/2019: Normal sinus rhythm.  Rare PAC and PVC.  PVC occurred both in the form of isolated PVCs and in bigeminal pattern.  No atrial fibrillation was noted. No reported symptoms  Echocardiogram 09/01/2020:  Mildly depressed LV systolic function with visual EF 45-50%. Left ventricle cavity is normal in size. Hypokinetic global wall motion. Doppler evidence of grade I (impaired) diastolic dysfunction, normal LAP. Left atrial cavity is slightly dilated at 4.2 cm. Trileaflet aortic valve.  Mild (Grade I) aortic regurgitation. IVC is dilated with a respiratory response of <50%. May suggest mild elevation in CVP. No significant change from 09/25/2019.    EKG:   EKG 09/17/2020: Sinus rhythm with first-degree AV block at the rate of 69 bpm, leftward enlargement, left axis deviation, left anterior fascicular block.  Poor R wave progression, cannot exclude anteroseptal infarct old.  IVCD, borderline criteria for LVH.  Nonspecific T abnormality, cannot exclude inferior ischemia. No significant change from EKG 03/02/2020.  Assessment:     ICD-10-CM   1. Chronic diastolic (congestive) heart failure (HCC)  I50.32   2. Nonischemic cardiomyopathy (HCC)  I42.8   3. Primary hypertension  I10     No orders of the defined types were placed in this encounter.  There are no discontinued medications.  No orders of the defined types were placed in this encounter.    Recommendations:    Keith [name].  is a 75 y.o. male  with HTN, HLD, DM type 2, central sleep apnea, and stage 3 CKD, recently reevaluated by [place] for frequent PVCs seen during sleep study  echocardiogram in January 2021 revealing new cardiomyopathy with EF 40 to 45%, nuclear stress test confirmed nonischemic etiology with reduced EF at 35%.  Patient also has had normal coronary arteries by angiography in 2015.  24-hour Holter monitor revealed occasional PVCs.  He now presents for a 38-month office visit and follow-up.  He is markedly sedentary, to the holidays he has gained 6 pounds in weight, is presently in acute diastolic heart failure although denies complaints of any dyspnea, he has mild elevation in JVD and his leg edema is clearly 2+ and increased since last office visit.  I discussed with him regarding heart failure symptoms and signs.  We will start him on furosemide 40 mg to be taken on a as needed basis.  I will obtain a BMP and also BNP today and in 2 weeks again and advised to see him back in 4 weeks.  Strict instructions regarding heart failure symptoms and avoidance of salty food discussed with the patient.  His blood pressure is also elevated today, will increase his metoprolol tartrate from 25 mg twice daily to 37.5 mg twice daily.  He have stage IV chronic  kidney disease and hence not on ACE inhibitor or ARB.  He has been compliant with his BiPAP for central sleep apnea.  No PVCs are noted on his EKG today.  I discontinued his nitroglycerin as he does not need it.  This was a 40-minute encounter  ***

## 2020-10-15 ENCOUNTER — Ambulatory Visit: Payer: Medicare Other | Admitting: Student

## 2020-10-15 DIAGNOSIS — I10 Essential (primary) hypertension: Secondary | ICD-10-CM

## 2020-10-15 DIAGNOSIS — I428 Other cardiomyopathies: Secondary | ICD-10-CM

## 2020-10-15 DIAGNOSIS — I5032 Chronic diastolic (congestive) heart failure: Secondary | ICD-10-CM

## 2020-10-16 ENCOUNTER — Other Ambulatory Visit: Payer: Self-pay

## 2020-10-16 ENCOUNTER — Encounter: Payer: Self-pay | Admitting: Student

## 2020-10-16 ENCOUNTER — Ambulatory Visit: Payer: Medicare Other | Admitting: Student

## 2020-10-16 VITALS — BP 134/73 | HR 74 | Temp 97.7°F | Resp 16 | Ht 72.0 in | Wt 270.0 lb

## 2020-10-16 DIAGNOSIS — I428 Other cardiomyopathies: Secondary | ICD-10-CM

## 2020-10-16 DIAGNOSIS — I5032 Chronic diastolic (congestive) heart failure: Secondary | ICD-10-CM | POA: Diagnosis not present

## 2020-10-16 DIAGNOSIS — I10 Essential (primary) hypertension: Secondary | ICD-10-CM | POA: Diagnosis not present

## 2020-10-16 DIAGNOSIS — N1832 Chronic kidney disease, stage 3b: Secondary | ICD-10-CM | POA: Diagnosis not present

## 2020-10-16 MED ORDER — METOPROLOL TARTRATE 37.5 MG PO TABS
37.5000 mg | ORAL_TABLET | Freq: Two times a day (BID) | ORAL | 3 refills | Status: AC
Start: 2020-10-16 — End: ?

## 2020-10-16 NOTE — Progress Notes (Signed)
Primary Physician:  Leanna Battles, MD   Patient ID: Keith Drain., male    DOB: 1946-01-09, 75 y.o.   MRN: [id number]  Subjective:    No chief complaint on file.   HPI: Keith Bink.  is a 75 y.o. male  with HTN, HLD, DM type 2, central sleep apnea, and stage 3 CKD,  frequent PVCs seen during sleep study  echocardiogram in January 2021 revealing new cardiomyopathy with EF 40 to 45%, nuclear stress test confirmed nonischemic etiology with reduced EF at 35%.  Patient also has had normal coronary arteries by angiography in 2015.  24-hour Holter monitor revealed occasional PVCs.   Patient presents for 4 week follow up of heart failure. At last visit started patient on furosemide 40 mg daily as needed with repeat BMP and BNP in 2 weeks. Patient's blood pressure was also elevated at last visit, increased metoprolol tartrate to 37.5 mg twice daily. BNP on 10/01/20 was 31.  Patient states he is presently feeling well.  Denies symptoms of chest pain, dyspnea, orthopnea, PND, palpitations, syncope or near syncope.  He states bilateral lower leg edema has significantly improved since last visit.  He is continue to have significant urine output with furosemide 40 mg once daily.  Fortunately he does not monitor his blood pressure at home frequently, however when he does he reports numbers approximately 130/70 mmHg.  Since last office visit patient has lost 2 pounds.  Doing medication reconciliation patient noted to be take both metoprolol and carvedilol.  Past Medical History:  Diagnosis Date  . Anginal pain ([place])   . Anxiety    takes Celexa  . Arthritis    bil knees  . Benign prostatic hypertrophy    takes Flomax daily  . Bladder stones 06/25/2013   1.9cm bladder stone.  Marland Kitchen BPH with urinary obstruction 06/25/2013   127cc prostate with large middle lobe and bladder stones.   . Cardiomyopathy ([place])   . CHF (congestive heart failure) ([place])   . Complication of anesthesia    hard to wake  up  . Depression   . Diabetes mellitus without complication ([place])   . Dysrhythmia   . ED (erectile dysfunction)   . History of kidney stones   . Hyperlipidemia   . Hypertension   . PONV (postoperative nausea and vomiting)   . Sleep apnea    uses bipap machine  . Spinal headache    reports last spinal headache was yesterday 05-16-18, lasted for 2 hours a, relief with tylenol , denies vision change with yesterdays occurrence but wife endorses vision changes with past spinal headaches    . Type 2 DM with CKD stage 3 and hypertension ([place])     Past Surgical History:  Procedure Laterality Date  . CARDIAC CATHETERIZATION  06-15-14  . CATARACT EXTRACTION W/ INTRAOCULAR LENS  IMPLANT, BILATERAL  03/13/2003  . COLONOSCOPY    . CYSTOSCOPY W/ URETERAL STENT PLACEMENT Bilateral 01/08/2020   Procedure: CYSTOSCOPY WITH bilateral  RETROGRADE PYELOGRAM/ bilateral URETERAL STENT PLACEMENT;  Surgeon: Bjorn Loser, MD;  Location: [facility];  Service: Urology;  Laterality: Bilateral;  . CYSTOSCOPY WITH LITHOLAPAXY N/A 06/25/2013   Procedure: CYSTOSCOPY WITH LITHOLAPAXY;  Surgeon: [name], MD;  Location: [facility];  Service: Urology;  Laterality: N/A;  . CYSTOSCOPY/URETEROSCOPY/HOLMIUM LASER/STENT PLACEMENT Bilateral 01/30/2020   Procedure: CYSTOSCOPY BILATERAL URETEROSCOPY WITH STENT EXCHANGE;  Surgeon: [name], MD;  Location: [facility];  Service: Urology;  Laterality: Bilateral;  . KIDNEY STONE SURGERY  01/2020  . KNEE ARTHROSCOPY  09/07/2010   left knee  . KNEE ARTHROSCOPY  05/27/2000   left knee  . LEFT HEART CATHETERIZATION WITH CORONARY ANGIOGRAM N/A 06/17/2014   Procedure: LEFT HEART CATHETERIZATION WITH CORONARY ANGIOGRAM;  Surgeon: Laverda Page, MD;  Location: [facility];  Service: Cardiovascular;  Laterality: N/A;  . RETINAL DETACHMENT REPAIR W/ SCLERAL BUCKLE LE  10/14/2002   Dr Zadie Rhine  . TONSILLECTOMY     age 70  . TOTAL KNEE ARTHROPLASTY  08/01/2011   Procedure: TOTAL KNEE  ARTHROPLASTY;  Surgeon: Lorn Junes, MD;  Location: [facility];  Service: Orthopedics;  Laterality: Right;  TOTAL KNEE ARTHROPLASTY  RIGHT SIDE  . TOTAL KNEE ARTHROPLASTY Left 05/22/2018   Procedure: LEFT TOTAL KNEE ARTHROPLASTY;  Surgeon: Paralee Cancel, MD;  Location: [facility];  Service: Orthopedics;  Laterality: Left;  . TRANSURETHRAL RESECTION OF PROSTATE N/A 06/25/2013   Procedure: TRANSURETHRAL RESECTION OF THE PROSTATE WITH GYRUS INSTRUMENTS;  Surgeon: [name], MD;  Location: [facility];  Service: Urology;  Laterality: N/A;  . VASECTOMY  1982   Tobacco Use: Low Risk   . Smoking Tobacco Use: Never Smoker  . Smokeless Tobacco Use: Never Used  Marital At[place]: Married   Review of Systems  Constitutional: Negative for malaise/fatigue and weight gain.  Cardiovascular: Positive for leg swelling (ankles, improved). Negative for chest pain, claudication, dyspnea on exertion, near-syncope, orthopnea, palpitations, paroxysmal nocturnal dyspnea and syncope.  Respiratory: Positive for snoring. Negative for shortness of breath.   Hematologic/Lymphatic: Does not bruise/bleed easily.  Gastrointestinal: Negative for melena.  Neurological: Negative for dizziness and weakness.   Objective:  Blood pressure 134/73, pulse 74, temperature 97.7 F (36.5 C), temperature source Temporal, resp. rate 16, height 6' (1.829 m), weight 270 lb (122.5 kg), SpO2 98 %. Body mass index is 36.62 kg/m.   Vitals with BMI 10/16/2020 10/05/2020 09/17/2020  Height 6\' 0"  6\' 0"  6\' 0"   Weight 270 lbs 264 lbs 3 oz 272 lbs  BMI 36.61 63.89 37.34  Systolic 287 681 157  Diastolic 73 70 71  Pulse 74 62 66      Physical Exam Vitals reviewed.  Constitutional:      General: He is not in acute distress.    Appearance: He is well-developed. He is obese.     Comments: Well built and moderately obese  Cardiovascular:     Rate and Rhythm: Normal rate and regular rhythm.     Pulses: Normal pulses and intact distal pulses.     Heart  sounds: Normal heart sounds.     Comments: No JVD. 1+ pitting edema bilateral ankles, left > right.   Pulmonary:     Effort: Pulmonary effort is normal. No accessory muscle usage or respiratory distress.     Breath sounds: Normal breath sounds.  Abdominal:     General: Abdomen is flat. There is no distension.     Palpations: Abdomen is soft.  Skin:    General: Skin is warm and dry.     Capillary Refill: Capillary refill takes less than 2 seconds.  Psychiatric:        Mood and Affect: Mood normal.        Behavior: Behavior normal.     Laboratory examination:   External labs: Lipid Panel completed 06/14/2018 HDL 43 MG/DL 06/14/2018 LDL 62.000 mg 06/14/2018 Cholesterol, total 129.000 m 06/14/2018 Triglycerides 122.000 06/14/2018  A1C 6.600 % 10/09/2019  Glucose Random 275.000 M 06/20/2019 MicroAlbumin Urine 28.000 06/21/2018 MicroAlbumin/Creat 24.4 MG/  06/21/2018  BUN 21.000 M 06/20/2019 Creatinine, Serum 1.950 MG/ 06/20/2019  CMP Latest Ref Rng & Units 09/17/2020 01/23/2020 01/11/2020  Glucose 65 - 99 mg/dL 172(H) 191(H) 176(H)  BUN 8 - 27 mg/dL 31(H) 31(H) 54(H)  Creatinine 0.76 - 1.27 mg/dL 2.12(H) 2.13(H) 2.97(H)  Sodium 134 - 144 mmol/L 135 139 140  Potassium 3.5 - 5.2 mmol/L 5.0 4.7 4.3  Chloride 96 - 106 mmol/L 98 106 107  CO2 20 - 29 mmol/L 23 25 23   Calcium 8.6 - 10.2 mg/dL 9.1 9.0 9.0  Total Protein 6.5 - 8.1 g/dL - - -  Total Bilirubin 0.3 - 1.2 mg/dL - - -  Alkaline Phos 38 - 126 U/L - - -  AST 15 - 41 U/L - - -  ALT 0 - 44 U/L - - -   CBC Latest Ref Rng & Units 01/23/2020 01/10/2020 01/09/2020  WBC 4.0 - 10.5 K/uL 8.4 6.5 11.4(H)  Hemoglobin 13.0 - 17.0 g/dL 12.3(L) 13.0 13.2  Hematocrit 39.0 - 52.0 % 38.7(L) 39.6 39.7  Platelets 150 - 400 K/uL 244 149(L) 156    Lipid Panel     Component Value Date/Time   CHOL 119 11/12/2009 0856   TRIG 75.0 11/12/2009 0856   TRIG 118 06/29/2006 1325   HDL 47.00 11/12/2009 0856   CHOLHDL 3 11/12/2009 0856   VLDL 15.0  11/12/2009 0856   LDLCALC 57 11/12/2009 0856   LDLDIRECT 141.0 01/30/2007 1455   HEMOGLOBIN A1C Lab Results  Component Value Date   HGBA1C 7.4 (H) 01/08/2020   MPG 165.68 01/08/2020   TSH No results for input(s): TSH in the last 8760 hours.  BNP    Component Value Date/Time   BNP 31.1 10/01/2020 1505    ProBNP No results found for: PROBNP   Allergies and Medications    Allergies  Allergen Reactions  . Penicillins Anaphylaxis    eyes swell shut  Has patient had a PCN reaction causing immediate rash, facial/tongue/throat swelling, SOB or lightheadedness with hypotension: yes Has patient had a PCN reaction causing severe rash involving mucus membranes or skin necrosis: no Has patient had a PCN reaction that required hospitalization no Has patient had a PCN reaction occurring within the last 10 years: no If all of the above answers are "NO", then may proceed with Cephalosporin use.  Judeth Cornfield Reductase Inhibitors      Current Outpatient Medications on File Prior to Visit  Medication Sig Dispense Refill  . Armodafinil 250 MG tablet Take 250 mg by mouth daily.    Marland Kitchen aspirin EC 81 MG tablet Take 81 mg by mouth daily.    Marland Kitchen atorvastatin (LIPITOR) 20 MG tablet Take 20 mg by mouth daily.    . Cholecalciferol (VITAMIN D) 2000 UNITS tablet Take 2,000 Units by mouth daily.    Marland Kitchen donepezil (ARICEPT) 5 MG tablet Take 5 mg by mouth at bedtime.    Marland Kitchen escitalopram (LEXAPRO) 20 MG tablet Take 20 mg by mouth daily.     . finasteride (PROSCAR) 5 MG tablet Take 5 mg by mouth daily.    . furosemide (LASIX) 40 MG tablet Take 1 tablet (40 mg total) by mouth daily as needed (leg swelling). 30 tablet 2  . gabapentin (NEURONTIN) 100 MG capsule Take 1 capsule by mouth daily as needed (nerve pain).     . Omega-3 Fatty Acids (FISH OIL) 1000 MG CAPS Take 1,000 mg by mouth daily.    Marland Kitchen OZEMPIC, 1 MG/DOSE, 2 MG/1.5ML SOPN Inject 1  mg into the skin once a week.    . solifenacin (VESICARE) 10 MG tablet  Take 10 mg by mouth daily.    . trospium (SANCTURA) 20 MG tablet Take 20 mg by mouth 2 (two) times daily.     No current facility-administered medications on file prior to visit.     Radiology    No results found.  Cardiac Studies:   Coronary Angiogram  [06/17/2014]: Mild noncritical coronary artery disease involving 10-20% stenosis, mid rca 20%, normal LV systolic function.  ABI's [05/14/2014]: Normal bilateral ABI at 1.11 with normal triphasic waveform at the level of the ankle.  Lexiscan (Walking with mod Bruce)Tetrofosmin Stress Test  10/09/2019: Nondiagnostic ECG stress. Resting EKG/ECG demonstrated normal sinus rhythm. Left ventricular strain patterns present.  Peak EKG/ECG revealed no significant ST-T change from baseline abnormality. There is a fixed moderate defect in the inferior and apical regions consistent with scar versus diaphragmatic attenuation. Global hypokinesis with stress LV EF: 35%. LV is mildly dilated in both rest and stress images.  No significant change from 04/21/2014.  Intermediate risk study due to low LVEF.   24-hour Holter monitor 10/02/2019: Normal sinus rhythm.  Rare PAC and PVC.  PVC occurred both in the form of isolated PVCs and in bigeminal pattern.  No atrial fibrillation was noted. No reported symptoms  Echocardiogram 09/01/2020:  Mildly depressed LV systolic function with visual EF 45-50%. Left ventricle cavity is normal in size. Hypokinetic global wall motion. Doppler evidence of grade I (impaired) diastolic dysfunction, normal LAP. Left atrial cavity is slightly dilated at 4.2 cm. Trileaflet aortic valve.  Mild (Grade I) aortic regurgitation. IVC is dilated with a respiratory response of <50%. May suggest mild elevation in CVP. No significant change from 09/25/2019.    EKG:   EKG 09/17/2020: Sinus rhythm with first-degree AV block at the rate of 69 bpm, leftward enlargement, left axis deviation, left anterior fascicular block.  Poor R wave  progression, cannot exclude anteroseptal infarct old.  IVCD, borderline criteria for LVH.  Nonspecific T abnormality, cannot exclude inferior ischemia. No significant change from EKG 03/02/2020.  Assessment:     ICD-10-CM   1. Chronic diastolic heart failure (HCC)  I50.32   2. Nonischemic cardiomyopathy (HCC)  I42.8 Metoprolol Tartrate 37.5 MG TABS  3. Primary hypertension  I10 Metoprolol Tartrate 37.5 MG TABS    Basic metabolic panel  4. Chronic kidney disease, stage 3b (HCC)  N18.32     Meds ordered this encounter  Medications  . Metoprolol Tartrate 37.5 MG TABS    Sig: Take 37.5 mg by mouth 2 (two) times daily.    Dispense:  180 tablet    Refill:  3   Medications Discontinued During This Encounter  Medication Reason  . magnesium oxide (MAG-OX) 400 MG tablet Error  . oxybutynin (DITROPAN-XL) 10 MG 24 hr tablet Error  . OZEMPIC, 0.25 OR 0.5 MG/DOSE, 2 MG/1.5ML SOPN Error  . triamcinolone cream (KENALOG) 0.1 % Error  . metoprolol tartrate 37.5 MG TABS     Orders Placed This Encounter  Procedures  . Basic metabolic panel     Recommendations:   Keith Droke.  is a 75 y.o. male  with HTN, HLD, DM type 2, central sleep apnea, and stage 3 CKD, recently reevaluated by [place] for frequent PVCs seen during sleep study  echocardiogram in January 2021 revealing new cardiomyopathy with EF 40 to 45%, nuclear stress test confirmed nonischemic etiology with reduced EF at 35%.  Patient also has  had normal coronary arteries by angiography in 2015.  24-hour Holter monitor revealed occasional PVCs.  Patient presents for 4-week follow-up of heart failure and hypertension.  There are no clinical signs of acute decompensated heart failure at this time, no JVD and leg edema has significantly improved since last visit.  Patient is presently taking furosemide 40 mg once daily, advised him to switch to taking as needed for volume overload.  Discussed with patient and his wife regarding as needed use of  furosemide, they both verbalized understanding and agreement.  Notably patient been taking both metoprolol and carvedilol, will stop carvedilol and continue metoprolol titrate 37.5 mg twice daily.  In regard to hypertension, in the office today blood pressure is well controlled, however, patient will be stopping carvedilol. Advised patient to monitor his blood pressure daily and bring a written log to his next office visit in order to reevaluate blood pressure control. Patient will notify our office if home blood pressure readings consistently >130/80 mmHg.  No other changes were made to his cardiovascular medications today. Will repeat BMP to monitor renal function with daily furosemide dosing over the last month.   Follow up in 3 months, sooner if needed, for hypertension, heart failure. If he remains stable can consider follow up in 6 months at that time.    Alethia Berthold, PA-C 10/16/2020, 2:00 PM Office: [phone]

## 2020-11-02 ENCOUNTER — Ambulatory Visit (INDEPENDENT_AMBULATORY_CARE_PROVIDER_SITE_OTHER): Payer: Medicare Other | Admitting: Podiatry

## 2020-11-02 ENCOUNTER — Other Ambulatory Visit: Payer: Self-pay

## 2020-11-02 ENCOUNTER — Encounter: Payer: Self-pay | Admitting: Podiatry

## 2020-11-02 DIAGNOSIS — E1142 Type 2 diabetes mellitus with diabetic polyneuropathy: Secondary | ICD-10-CM

## 2020-11-02 DIAGNOSIS — B351 Tinea unguium: Secondary | ICD-10-CM

## 2020-11-02 DIAGNOSIS — M79675 Pain in left toe(s): Secondary | ICD-10-CM

## 2020-11-02 DIAGNOSIS — M79674 Pain in right toe(s): Secondary | ICD-10-CM

## 2020-11-03 DIAGNOSIS — E1121 Type 2 diabetes mellitus with diabetic nephropathy: Secondary | ICD-10-CM | POA: Diagnosis not present

## 2020-11-03 DIAGNOSIS — N184 Chronic kidney disease, stage 4 (severe): Secondary | ICD-10-CM | POA: Diagnosis not present

## 2020-11-03 DIAGNOSIS — I10 Essential (primary) hypertension: Secondary | ICD-10-CM | POA: Diagnosis not present

## 2020-11-07 NOTE — Progress Notes (Signed)
Subjective: Keith [name]. presents today for at risk foot care with history of diabetic neuropathy and painful thick toenails that are difficult to trim. Pain interferes with ambulation. Aggravating factors include wearing enclosed shoe gear. Pain is relieved with periodic professional debridement..  Patient utilizes compression stockings for management of edema. He voices no new pedal concerns on today's visit. He states his blood glucose was 163 mg/dl, which is higher than normal for him.  Leanna Battles, MD is patient's PCP. Last visit was 06/11/2020.  Allergies  Allergen Reactions  . Penicillins Anaphylaxis    eyes swell shut  Has patient had a PCN reaction causing immediate rash, facial/tongue/throat swelling, SOB or lightheadedness with hypotension: yes Has patient had a PCN reaction causing severe rash involving mucus membranes or skin necrosis: no Has patient had a PCN reaction that required hospitalization no Has patient had a PCN reaction occurring within the last 10 years: no If all of the above answers are "NO", then may proceed with Cephalosporin use.  Judeth Cornfield Reductase Inhibitors     Objective: There were no vitals filed for this visit.  Keith [name]. is a pleasant 75 y.o. male in NAD. AAO X 3.  Vascular Examination: Capillary refill time to digits immediate b/l. Palpable pedal pulses b/l LE. Pedal hair sparse. Lower extremity skin temperature gradient within normal limits. No pain with calf compression b/l. Nonpitting edema noted b/l lower extremities. Evidence of chronic venous insufficiency b/l LE.   Dermatological Examination: Pedal skin with normal turgor, texture and tone bilaterally. No open wounds bilaterally. No interdigital macerations bilaterally. Toenails 1-5 b/l elongated, discolored, dystrophic, thickened, crumbly with subungual debris and tenderness to dorsal palpation.  Musculoskeletal Examination: Normal muscle strength 5/5 to all lower  extremity muscle groups bilaterally. No pain crepitus or joint limitation noted with ROM b/l. Hallux valgus with bunion deformity noted b/l lower extremities. Patient ambulates independent of any assistive aids. Wearing Skechers slip on loafers with memory foam insoles.  Neurological Examination: Pt has subjective symptoms of neuropathy. Protective sensation intact 5/5 intact bilaterally with 10g monofilament b/l. Vibratory sensation decreased b/l. Proprioception intact bilaterally. Deep tendon reflexes normal b/l.  Clonus negative b/l.  Hemoglobin A1C Latest Ref Rng & Units 01/08/2020  HGBA1C 4.8 - 5.6 % 7.4(H)  Some recent data might be hidden   Assessment: 1. Pain due to onychomycosis of toenails of both feet   2. Diabetic peripheral neuropathy associated with type 2 diabetes mellitus ([place])     Plan: -Examined patient. -No new findings. No new orders. -Continue diabetic foot care principles. -Toenails 1-5 b/l were debrided in length and girth with sterile nail nippers and dremel without iatrogenic bleeding.  -Patient to report any pedal injuries to medical professional immediately. -Patient/POA to call should there be question/concern in the interim.  Return in about 3 months (around 01/30/2021).  Marzetta Board, DPM

## 2020-11-11 ENCOUNTER — Telehealth: Payer: Self-pay

## 2020-11-11 NOTE — Telephone Encounter (Signed)
-----   Message from Alethia Berthold, [place] sent at 11/11/2020  8:35 AM EST ----- Please call and get blood pressure readings from the last 1 week since stopping carvedilol. Please send to me for review.

## 2020-11-11 NOTE — Telephone Encounter (Signed)
Please advise patient his BP and HR are trending in the right direction. No changes to his medications. Have him continue to monitor BP and notify our office if is remains consistently >130/80 mmHg in 4 weeks.

## 2020-11-11 NOTE — Telephone Encounter (Signed)
Called patient for his bp reading since stopping carvedilol this is what the patient had so far.  2/11 141/85 pulse 71  2/18 145/88 pulse 82  2/21 134/83 pulse 72  2/28 156/76 pulse 75  3/01 138/82 pulse 79

## 2020-11-13 NOTE — Telephone Encounter (Signed)
Spoke to patient

## 2020-11-20 ENCOUNTER — Emergency Department (HOSPITAL_COMMUNITY): Payer: Medicare Other

## 2020-11-20 ENCOUNTER — Encounter (HOSPITAL_COMMUNITY): Payer: Self-pay

## 2020-11-20 ENCOUNTER — Other Ambulatory Visit: Payer: Self-pay

## 2020-11-20 ENCOUNTER — Emergency Department (HOSPITAL_COMMUNITY)
Admission: EM | Admit: 2020-11-20 | Discharge: 2020-11-20 | Disposition: A | Discharge status: Home / Self Care | Payer: Medicare Other | Attending: Emergency Medicine | Admitting: Emergency Medicine

## 2020-11-20 DIAGNOSIS — R0602 Shortness of breath: Secondary | ICD-10-CM | POA: Insufficient documentation

## 2020-11-20 DIAGNOSIS — R059 Cough, unspecified: Secondary | ICD-10-CM | POA: Diagnosis not present

## 2020-11-20 DIAGNOSIS — E1122 Type 2 diabetes mellitus with diabetic chronic kidney disease: Secondary | ICD-10-CM | POA: Diagnosis not present

## 2020-11-20 DIAGNOSIS — I509 Heart failure, unspecified: Secondary | ICD-10-CM | POA: Diagnosis not present

## 2020-11-20 DIAGNOSIS — R42 Dizziness and giddiness: Secondary | ICD-10-CM | POA: Diagnosis not present

## 2020-11-20 DIAGNOSIS — R600 Localized edema: Secondary | ICD-10-CM | POA: Diagnosis not present

## 2020-11-20 DIAGNOSIS — F0390 Unspecified dementia without behavioral disturbance: Secondary | ICD-10-CM | POA: Insufficient documentation

## 2020-11-20 DIAGNOSIS — R4182 Altered mental status, unspecified: Secondary | ICD-10-CM | POA: Diagnosis not present

## 2020-11-20 DIAGNOSIS — N1832 Chronic kidney disease, stage 3b: Secondary | ICD-10-CM | POA: Insufficient documentation

## 2020-11-20 DIAGNOSIS — N390 Urinary tract infection, site not specified: Secondary | ICD-10-CM | POA: Diagnosis not present

## 2020-11-20 DIAGNOSIS — R0981 Nasal congestion: Secondary | ICD-10-CM | POA: Diagnosis not present

## 2020-11-20 DIAGNOSIS — Z96653 Presence of artificial knee joint, bilateral: Secondary | ICD-10-CM | POA: Insufficient documentation

## 2020-11-20 DIAGNOSIS — R410 Disorientation, unspecified: Secondary | ICD-10-CM | POA: Insufficient documentation

## 2020-11-20 DIAGNOSIS — Z79899 Other long term (current) drug therapy: Secondary | ICD-10-CM | POA: Insufficient documentation

## 2020-11-20 DIAGNOSIS — Z955 Presence of coronary angioplasty implant and graft: Secondary | ICD-10-CM | POA: Diagnosis not present

## 2020-11-20 DIAGNOSIS — Z7982 Long term (current) use of aspirin: Secondary | ICD-10-CM | POA: Insufficient documentation

## 2020-11-20 DIAGNOSIS — Z794 Long term (current) use of insulin: Secondary | ICD-10-CM | POA: Diagnosis not present

## 2020-11-20 DIAGNOSIS — N3020 Other chronic cystitis without hematuria: Secondary | ICD-10-CM | POA: Diagnosis not present

## 2020-11-20 DIAGNOSIS — Z20822 Contact with and (suspected) exposure to covid-19: Secondary | ICD-10-CM | POA: Diagnosis not present

## 2020-11-20 DIAGNOSIS — B952 Enterococcus as the cause of diseases classified elsewhere: Secondary | ICD-10-CM | POA: Diagnosis not present

## 2020-11-20 DIAGNOSIS — I130 Hypertensive heart and chronic kidney disease with heart failure and stage 1 through stage 4 chronic kidney disease, or unspecified chronic kidney disease: Secondary | ICD-10-CM | POA: Insufficient documentation

## 2020-11-20 LAB — CBC
HCT: 40.5 % (ref 39.0–52.0)
Hemoglobin: 13.2 g/dL (ref 13.0–17.0)
MCH: 31.8 pg (ref 26.0–34.0)
MCHC: 32.6 g/dL (ref 30.0–36.0)
MCV: 97.6 fL (ref 80.0–100.0)
Platelets: 235 10*3/uL (ref 150–400)
RBC: 4.15 MIL/uL — ABNORMAL LOW (ref 4.22–5.81)
RDW: 12.8 % (ref 11.5–15.5)
WBC: 7.5 10*3/uL (ref 4.0–10.5)
nRBC: 0 % (ref 0.0–0.2)

## 2020-11-20 LAB — URINALYSIS, ROUTINE W REFLEX MICROSCOPIC
Bilirubin Urine: NEGATIVE
Glucose, UA: >=500 mg/dL — AB
Ketones, ur: NEGATIVE mg/dL
Nitrite: POSITIVE — AB
Protein, ur: NEGATIVE mg/dL
Specific Gravity, Urine: 1.017 (ref 1.005–1.030)
pH: 5 (ref 5.0–8.0)

## 2020-11-20 LAB — BRAIN NATRIURETIC PEPTIDE: B Natriuretic Peptide: 177.8 pg/mL — ABNORMAL HIGH (ref 0.0–100.0)

## 2020-11-20 LAB — COMPREHENSIVE METABOLIC PANEL
ALT: 23 U/L (ref 0–44)
AST: 19 U/L (ref 15–41)
Albumin: 3.9 g/dL (ref 3.5–5.0)
Alkaline Phosphatase: 72 U/L (ref 38–126)
Anion gap: 7 (ref 5–15)
BUN: 34 mg/dL — ABNORMAL HIGH (ref 8–23)
CO2: 26 mmol/L (ref 22–32)
Calcium: 9.2 mg/dL (ref 8.9–10.3)
Chloride: 105 mmol/L (ref 98–111)
Creatinine, Ser: 1.93 mg/dL — ABNORMAL HIGH (ref 0.61–1.24)
GFR, Estimated: 36 mL/min — ABNORMAL LOW (ref >60)
Glucose, Bld: 203 mg/dL — ABNORMAL HIGH (ref 70–99)
Potassium: 4.4 mmol/L (ref 3.5–5.1)
Sodium: 138 mmol/L (ref 135–145)
Total Bilirubin: 0.8 mg/dL (ref 0.3–1.2)
Total Protein: 7.2 g/dL (ref 6.5–8.1)

## 2020-11-20 LAB — RESP PANEL BY RT-PCR (FLU A&B, COVID) ARPGX2
Influenza A by PCR: NEGATIVE
Influenza B by PCR: NEGATIVE
SARS Coronavirus 2 by RT PCR: NEGATIVE

## 2020-11-20 LAB — AMMONIA: Ammonia: 15 umol/L (ref 9–35)

## 2020-11-20 LAB — CBG MONITORING, ED: Glucose-Capillary: 189 mg/dL — ABNORMAL HIGH (ref 70–99)

## 2020-11-20 MED ORDER — SODIUM CHLORIDE 0.9 % IV BOLUS
500.0000 mL | Freq: Once | INTRAVENOUS | Status: AC
  Administered 2020-11-20: 500 mL via INTRAVENOUS

## 2020-11-20 MED ORDER — FUROSEMIDE 10 MG/ML IJ SOLN
40.0000 mg | Freq: Once | INTRAMUSCULAR | Status: AC
  Administered 2020-11-20: 40 mg via INTRAVENOUS
  Filled 2020-11-20: qty 4

## 2020-11-20 NOTE — ED Triage Notes (Signed)
Patient reports dizziness and confusion x 3-4 days. Patient states while at home he goes into a room and forgets why he is there and states he got lost going to his Urology appointment today.

## 2020-11-20 NOTE — Discharge Instructions (Addendum)
Take your Lasix as directed for the next 2 to 3 days.  Make sure that you follow-up with your doctor for recheck within the next few days.  Return here as needed if you have any worsening symptoms.

## 2020-11-20 NOTE — ED Notes (Signed)
Patient ambulated down the hallway  Patients oxygen stayed at 99% to 100%

## 2020-11-20 NOTE — ED Provider Notes (Signed)
[facility] DEPT Provider Note   CSN: [id number] Arrival date & time: 11/20/20  1443     History Chief Complaint  Patient presents with  . Dizziness  . AMS    Keith [name]. is a 75 y.o. male.  Patient is a 75 year old male with a history of diabetes, CHF, chronic kidney disease, hypertension, hyperlipidemia and early dementia who presents with confusion and dizziness.  He states that he has had some congestion and cold symptoms for about the last week.  He has had a cough which is mostly nonproductive.  No fevers.  He does feel more short of breath than his baseline.  He denies any chest pain or palpitations.  He has chronic leg swelling which per his wife is unchanged from his baseline.  He does have a history of CHF and is followed by Dr. [name] who recently took him off of his Lasix but per his wife was told that he could take it if his leg swelling got worse.  It does not sound like he is actually been taking any Lasix.  He reports over the last 3 to 4 days he has had some dizziness which she describes as lightheadedness on standing.  He says it is anytime he is up walking.  He denies any dizziness with head movement or when he sits still.  He feels a little bit more off balance with the dizziness.  He was seen by his urologist, Dr. Jeffie Pollock today and diagnosed with a UTI.  He denies any difficulty urinating.  His wife states that he was recently diagnosed with possible early dementia about 5 to 6 weeks ago.  She has noticed some intermittent confusion for about the last year but over the last couple weeks has gotten markedly worse where he is becoming more agitated and anxious.  He is also had some anger outburst.  He at times does not remember why he went into certain rooms and does not remember landmarks around the neighborhood where he has lived for a long time.  She does say that they are in process of moving and that is put a lot of stress on him.         Past Medical History:  Diagnosis Date  . Anginal pain ([place])   . Anxiety    takes Celexa  . Arthritis    bil knees  . Benign prostatic hypertrophy    takes Flomax daily  . Bladder stones 06/25/2013   1.9cm bladder stone.  Marland Kitchen BPH with urinary obstruction 06/25/2013   127cc prostate with large middle lobe and bladder stones.   . Cardiomyopathy ([place])   . CHF (congestive heart failure) ([place])   . Complication of anesthesia    hard to wake up  . Depression   . Diabetes mellitus without complication ([place])   . Dysrhythmia   . ED (erectile dysfunction)   . History of kidney stones   . Hyperlipidemia   . Hypertension   . PONV (postoperative nausea and vomiting)   . Sleep apnea    uses bipap machine  . Spinal headache    reports last spinal headache was yesterday 05-16-18, lasted for 2 hours a, relief with tylenol , denies vision change with yesterdays occurrence but wife endorses vision changes with past spinal headaches    . Type 2 DM with CKD stage 3 and hypertension [facility])     Patient Active Problem List   Diagnosis Date Noted  . Right epiretinal membrane  09/10/2020  . Pseudophakia 09/10/2020  . Diabetes mellitus without complication ([place]) 62/37/6283  . Posterior vitreous detachment, both eyes 09/10/2020  . Acute renal failure superimposed on stage 3b chronic kidney disease ([place]) 01/08/2020  . Type 2 diabetes mellitus with stage 3 chronic kidney disease ([place]) 01/08/2020  . Hyperlipidemia associated with type 2 diabetes mellitus ([place]) 01/08/2020  . Renal calculus, bilateral 01/08/2020  . Hydronephrosis of right kidney 01/08/2020  . Renal failure 01/08/2020  . Primary central sleep apnea 12/18/2019  . Sleep apnea treated with nocturnal BiPAP 12/18/2019  . Aftercare 08/13/2018  . Obese 05/23/2018  . S/P left TKA 05/22/2018  . Depression 02/13/2018  . BPH with urinary obstruction 06/25/2013  . Bladder stones 06/25/2013  . Diabetes type 2, uncontrolled ([place]) 08/14/2012  .  History of total knee replacement, right 08/01/2011  . DJD (degenerative joint disease) of knee 07/20/2011  . Hyperlipidemia 07/11/2011  . RHINITIS 01/28/2010  . MIGRAINE, CHRONIC 11/28/2008  . ERECTILE DYSFUNCTION, MILD 05/28/2008  . BENIGN PROSTATIC HYPERTROPHY 03/28/2008  . Hypertension associated with diabetes ([place]) 07/09/2007    Past Surgical History:  Procedure Laterality Date  . CARDIAC CATHETERIZATION  06-15-14  . CATARACT EXTRACTION W/ INTRAOCULAR LENS  IMPLANT, BILATERAL  03/13/2003  . COLONOSCOPY    . CYSTOSCOPY W/ URETERAL STENT PLACEMENT Bilateral 01/08/2020   Procedure: CYSTOSCOPY WITH bilateral  RETROGRADE PYELOGRAM/ bilateral URETERAL STENT PLACEMENT;  Surgeon: Bjorn Loser, MD;  Location: [facility];  Service: Urology;  Laterality: Bilateral;  . CYSTOSCOPY WITH LITHOLAPAXY N/A 06/25/2013   Procedure: CYSTOSCOPY WITH LITHOLAPAXY;  Surgeon: [name], MD;  Location: [facility];  Service: Urology;  Laterality: N/A;  . CYSTOSCOPY/URETEROSCOPY/HOLMIUM LASER/STENT PLACEMENT Bilateral 01/30/2020   Procedure: CYSTOSCOPY BILATERAL URETEROSCOPY WITH STENT EXCHANGE;  Surgeon: [name], MD;  Location: [facility];  Service: Urology;  Laterality: Bilateral;  . KIDNEY STONE SURGERY  01/2020  . KNEE ARTHROSCOPY  09/07/2010   left knee  . KNEE ARTHROSCOPY  05/27/2000   left knee  . LEFT HEART CATHETERIZATION WITH CORONARY ANGIOGRAM N/A 06/17/2014   Procedure: LEFT HEART CATHETERIZATION WITH CORONARY ANGIOGRAM;  Surgeon: Laverda Page, MD;  Location: [facility] CATH LAB;  Service: Cardiovascular;  Laterality: N/A;  . RETINAL DETACHMENT REPAIR W/ SCLERAL BUCKLE LE  10/14/2002   Dr Zadie Rhine  . TONSILLECTOMY     age 63  . TOTAL KNEE ARTHROPLASTY  08/01/2011   Procedure: TOTAL KNEE ARTHROPLASTY;  Surgeon: Lorn Junes, MD;  Location: [facility];  Service: Orthopedics;  Laterality: Right;  TOTAL KNEE ARTHROPLASTY  RIGHT SIDE  . TOTAL KNEE ARTHROPLASTY Left 05/22/2018   Procedure: LEFT TOTAL KNEE  ARTHROPLASTY;  Surgeon: Paralee Cancel, MD;  Location: [facility];  Service: Orthopedics;  Laterality: Left;  . TRANSURETHRAL RESECTION OF PROSTATE N/A 06/25/2013   Procedure: TRANSURETHRAL RESECTION OF THE PROSTATE WITH GYRUS INSTRUMENTS;  Surgeon: [name], MD;  Location: [facility];  Service: Urology;  Laterality: N/A;  . VASECTOMY  1982       Family History  Problem Relation Age of Onset  . Hypertension Mother   . Heart failure Mother   . Heart failure Father   . Arthritis Other   . Cancer Other        breast, skin  . Coronary artery disease Other   . Heart disease Other   . Anesthesia problems Maternal Grandmother   . Hypotension Neg Hx   . Malignant hyperthermia Neg Hx   . Pseudochol deficiency Neg Hx     Social History  Tobacco Use  . Smoking status: Never Smoker  . Smokeless tobacco: Never Used  Vaping Use  . Vaping Use: Never used  Substance Use Topics  . Alcohol use: Yes    Alcohol/week: 0.0 standard drinks    Comment: rare   . Drug use: No    Home Medications Prior to Admission medications   Medication Sig Start Date End Date Taking? Authorizing Provider  Armodafinil 250 MG tablet Take 250 mg by mouth daily. 09/26/20  Yes [provider]  aspirin EC 81 MG tablet Take 81 mg by mouth daily.   Yes [provider]  atorvastatin (LIPITOR) 20 MG tablet Take 20 mg by mouth daily.   Yes [provider]  Cholecalciferol (VITAMIN D) 2000 UNITS tablet Take 2,000 Units by mouth daily.   Yes [provider]  donepezil (ARICEPT) 5 MG tablet Take 5 mg by mouth at bedtime.   Yes [provider]  escitalopram (LEXAPRO) 20 MG tablet Take 20 mg by mouth daily.  11/13/17  Yes [provider]  finasteride (PROSCAR) 5 MG tablet Take 5 mg by mouth daily. 01/06/20  Yes [provider]  furosemide (LASIX) 40 MG tablet Take 1 tablet (40 mg total) by mouth daily as needed (leg swelling). Patient taking differently: Take 40 mg by  mouth daily. 09/17/20 12/16/20 Yes [name], MD  gabapentin (NEURONTIN) 100 MG capsule Take 3 capsules by mouth at bedtime. 09/20/19  Yes [provider]  insulin degludec (TRESIBA FLEXTOUCH) 100 UNIT/ML FlexTouch Pen Inject 16 Units into the skin every morning.   Yes [provider]  Metoprolol Tartrate 37.5 MG TABS Take 37.5 mg by mouth 2 (two) times daily. 10/16/20  Yes Cantwell, Celeste C, PA-C  Omega-3 Fatty Acids (FISH OIL) 1000 MG CAPS Take 1,000 mg by mouth daily.   Yes [provider]  solifenacin (VESICARE) 10 MG tablet Take 10 mg by mouth daily. 07/23/20  Yes [provider]  trospium (SANCTURA) 20 MG tablet Take 20 mg by mouth 2 (two) times daily. 09/25/20  Yes [provider]    Allergies    Penicillins and 5-alpha reductase inhibitors  Review of Systems   Review of Systems  Constitutional: Positive for fatigue. Negative for chills, diaphoresis and fever.  HENT: Positive for congestion and rhinorrhea. Negative for sneezing.   Eyes: Negative.   Respiratory: Positive for cough and shortness of breath. Negative for chest tightness.   Cardiovascular: Positive for leg swelling. Negative for chest pain.  Gastrointestinal: Negative for abdominal pain, blood in stool, diarrhea, nausea and vomiting.  Genitourinary: Negative for difficulty urinating, flank pain, frequency and hematuria.  Musculoskeletal: Negative for arthralgias and back pain.  Skin: Negative for rash.  Neurological: Positive for headaches. Negative for dizziness, speech difficulty, weakness and numbness.  Psychiatric/Behavioral: Positive for agitation and confusion.    Physical Exam Updated Vital Signs BP (!) 174/87   Pulse 67   Temp 97.6 F (36.4 C) (Oral)   Resp 16   Ht 6' (1.829 m)   Wt 117.9 kg   SpO2 98%   BMI 35.26 kg/m   Physical Exam Constitutional:      Appearance: He is well-developed.  HENT:     Head: Normocephalic and atraumatic.  Eyes:     Pupils:  Pupils are equal, round, and reactive to light.     Comments: No nystagmus  Cardiovascular:     Rate and Rhythm: Normal rate and regular rhythm.     Heart sounds: Normal  heart sounds.  Pulmonary:     Effort: Pulmonary effort is normal. No respiratory distress.     Breath sounds: Normal breath sounds. No wheezing or rales.  Chest:     Chest wall: No tenderness.  Abdominal:     General: Bowel sounds are normal.     Palpations: Abdomen is soft.     Tenderness: There is no abdominal tenderness. There is no guarding or rebound.  Musculoskeletal:        General: Normal range of motion.     Cervical back: Normal range of motion and neck supple.  Lymphadenopathy:     Cervical: No cervical adenopathy.  Skin:    General: Skin is warm and dry.     Findings: No rash.  Neurological:     General: No focal deficit present.     Mental Status: He is alert and oriented to person, place, and time.     ED Results / Procedures / Treatments   Labs (all labs ordered are listed, but only abnormal results are displayed) Labs Reviewed  CBC - Abnormal; Notable for the following components:      Result Value   RBC 4.15 (*)    All other components within normal limits  URINALYSIS, ROUTINE W REFLEX MICROSCOPIC - Abnormal; Notable for the following components:   APPearance HAZY (*)    Glucose, UA >=500 (*)    Hgb urine dipstick SMALL (*)    Nitrite POSITIVE (*)    Leukocytes,Ua SMALL (*)    Bacteria, UA MANY (*)    All other components within normal limits  COMPREHENSIVE METABOLIC PANEL - Abnormal; Notable for the following components:   Glucose, Bld 203 (*)    BUN 34 (*)    Creatinine, Ser 1.93 (*)    GFR, Estimated 36 (*)    All other components within normal limits  BRAIN NATRIURETIC PEPTIDE - Abnormal; Notable for the following components:   B Natriuretic Peptide 177.8 (*)    All other components within normal limits  CBG MONITORING, ED - Abnormal; Notable for the following components:    Glucose-Capillary 189 (*)    All other components within normal limits  RESP PANEL BY RT-PCR (FLU A&B, COVID) ARPGX2  AMMONIA    EKG EKG Interpretation  Date/Time:  Friday November 20 2020 15:36:13 EST Ventricular Rate:  68 PR Interval:    QRS Duration: 134 QT Interval:  424 QTC Calculation: 451 R Axis:   -32 Text Interpretation: Sinus rhythm Prolonged PR interval Left bundle branch block since last tracing no significant change Confirmed by Malvin Johns [phone]) on 11/20/2020 3:50:49 PM   Radiology DG Chest 2 View  Result Date: 11/20/2020 CLINICAL DATA:  Dizziness and confusion times 3-4 days. EXAM: CHEST - 2 VIEW COMPARISON:  Chest radiograph June 14, 2013 FINDINGS: The heart size and mediastinal contours are within normal limits. Aortic atherosclerosis. No focal consolidation. No pleural effusion. No pneumothorax. The visualized skeletal structures are unremarkable. IMPRESSION: 1. No acute cardiopulmonary disease. 2. Aortic atherosclerosis. Electronically Signed   By: Dahlia Bailiff MD   On: 11/20/2020 16:35   CT Head Wo Contrast  Result Date: 11/20/2020 CLINICAL DATA:  Altered mental status EXAM: CT HEAD WITHOUT CONTRAST TECHNIQUE: Contiguous axial images were obtained from the base of the skull through the vertex without intravenous contrast. COMPARISON:  MRI brain August 05, 2015. FINDINGS: Brain: No evidence of acute large vascular territory infarction, hemorrhage, acute hydrocephalus, extra-axial collection or mass lesion/mass effect. Similar moderate degree of global  parenchymal volume loss with ex vacuo dilatation of ventricular system. A few scattered hypodense foci in the subcortical and periventricular white matter likely mild burden of chronic small vessel ischemic white matter disease. Vascular: No hyperdense vessel. Atherosclerotic calcifications of the intracranial portions of the internal carotid and vertebral arteries. Skull: Minimal hyperostosis frontalis. Negative for  fracture or focal lesion. Sinuses/Orbits: Prior bilateral lens surgery and right scleral buckle placement. Scattered mucosal thickening of the paranasal sinuses. Other: None IMPRESSION: 1. No acute intracranial findings. 2. Moderate global parenchymal volume loss, which appears grossly similar to prior given differences in technique. Electronically Signed   By: Dahlia Bailiff MD   On: 11/20/2020 16:34    Procedures Procedures   Medications Ordered in ED Medications  sodium chloride 0.9 % bolus 500 mL (0 mLs Intravenous Stopped 11/20/20 1842)  furosemide (LASIX) injection 40 mg (40 mg Intravenous Given 11/20/20 1842)    ED Course  I have reviewed the triage vital signs and the nursing notes.  Pertinent labs & imaging results that were available during my care of the patient were reviewed by me and considered in my medical decision making (see chart for details).    MDM Rules/Calculators/A&P                          Patient presents with some shortness of breath and some dizziness which she describes as lightheadedness.  His wife also states he has had some worsening dementia symptoms over the last few weeks.  He is alert and oriented x3.  He does not have any focal neurologic deficits.  His chest x-ray is clear without evidence of pneumonia or pulmonary edema.  His BNP is mildly elevated.  He was given dose of Lasix in the ED given his pedal edema and reported shortness of breath although he does not appear short of breath here.  His creatinine is mildly elevated but actually better than his prior values.  His urine is appears infected.  He was seen at his urologist office today and diagnosed with a UTI.  It was sent for culture and he was started on Bactrim.  Patient is able to ambulate in the ED without any ataxia.  No shortness of breath.  No dizziness.  No hypoxia.  He is maintaining normal oxygen saturations of 99 to 100%.  I do not see any symptoms that would be more suggestive of PE.  He  does not have chest pain or other symptoms that would be more concerning of ACS.  He was discharged home in good condition.  His wife has an appointment to follow-up with a neurologist regarding his worsening dementia.  His head CT here does not show any acute abnormality.  I encouraged them to take his home Lasix for the next 2 to 3 days and then go back to using it as needed which was the instructions from his cardiologist.  He will start taking his Bactrim for his UTI.  I encouraged him to have a follow-up appointment with her PCP within the next few days.  Return precautions were given.  I did update the wife on the results and plan. Final Clinical Impression(s) / ED Diagnoses Final diagnoses:  Shortness of breath    Rx / DC Orders ED Discharge Orders    None       Malvin Johns, MD 11/20/20 1909

## 2020-12-02 DIAGNOSIS — F4321 Adjustment disorder with depressed mood: Secondary | ICD-10-CM | POA: Diagnosis not present

## 2020-12-02 DIAGNOSIS — H6523 Chronic serous otitis media, bilateral: Secondary | ICD-10-CM | POA: Diagnosis not present

## 2020-12-02 DIAGNOSIS — R42 Dizziness and giddiness: Secondary | ICD-10-CM | POA: Diagnosis not present

## 2020-12-02 DIAGNOSIS — I129 Hypertensive chronic kidney disease with stage 1 through stage 4 chronic kidney disease, or unspecified chronic kidney disease: Secondary | ICD-10-CM | POA: Diagnosis not present

## 2020-12-02 DIAGNOSIS — N184 Chronic kidney disease, stage 4 (severe): Secondary | ICD-10-CM | POA: Diagnosis not present

## 2020-12-02 DIAGNOSIS — R413 Other amnesia: Secondary | ICD-10-CM | POA: Diagnosis not present

## 2020-12-03 DIAGNOSIS — E119 Type 2 diabetes mellitus without complications: Secondary | ICD-10-CM | POA: Diagnosis not present

## 2020-12-08 ENCOUNTER — Telehealth: Payer: Self-pay | Admitting: Family Medicine

## 2020-12-08 NOTE — Telephone Encounter (Signed)
Can you please call to confirm that he was able to get a new masks and is doing ok on BiPAP. I have reviewed his report over the past 30 days. His leak has improved but compliance has been lower than it was. His residual apneic events (AHI) is up to 9, it was 6. I think this will improve with improved compliance. Have him follow up with DME if not comfortable with mask. Let's see him back in the office in 4-6 months. TY.

## 2020-12-08 NOTE — Telephone Encounter (Signed)
Contact pt regarding CPAP compliance, LVM per DPR. Informed him that Amy wants to confirm that he was able to get a new masks and is doing ok on BiPAP. She has reviewed his report over the past 30 days. His leak has improved but compliance has been lower than it was. His residual apneic events (AHI) is up to 9, it was 6. She believe this will improve with improved compliance. Advised him follow up with DME if not comfortable with mask and we will see him in July.

## 2020-12-14 ENCOUNTER — Encounter (INDEPENDENT_AMBULATORY_CARE_PROVIDER_SITE_OTHER): Payer: Medicare Other | Admitting: Ophthalmology

## 2021-01-12 NOTE — Progress Notes (Deleted)
Primary Physician:  Leanna Battles, MD   Patient ID: Keith [name]., male    DOB: 1945-11-02, 75 y.o.   MRN: [id number]  Subjective:    No chief complaint on file.   HPI: Keith [name].  is a 75 y.o. male  with HTN, HLD, DM type 2, central sleep apnea, and stage 3 CKD,  frequent PVCs seen during sleep study  echocardiogram in January 2021 revealing new cardiomyopathy with EF 40 to 45%, nuclear stress test confirmed nonischemic etiology with reduced EF at 35%.  Patient also has had normal coronary arteries by angiography in 2015.  24-hour Holter monitor revealed occasional PVCs.   ***Patient presents for 58-month follow-up of hypertension and heart failure.  At last visit patient was taking both metoprolol and carvedilol, therefore advised patient to discontinue carvedilol.  ***home BP?   Patient presents for 4 week follow up of heart failure. At last visit started patient on furosemide 40 mg daily as needed with repeat BMP and BNP in 2 weeks. Patient's blood pressure was also elevated at last visit, increased metoprolol tartrate to 37.5 mg twice daily. BNP on 10/01/20 was 31.  Patient states he is presently feeling well.  Denies symptoms of chest pain, dyspnea, orthopnea, PND, palpitations, syncope or near syncope.  He states bilateral lower leg edema has significantly improved since last visit.  He is continue to have significant urine output with furosemide 40 mg once daily.  Fortunately he does not monitor his blood pressure at home frequently, however when he does he reports numbers approximately 130/70 mmHg.  Since last office visit patient has lost 2 pounds.  Doing medication reconciliation patient noted to be take both metoprolol and carvedilol.  Past Medical History:  Diagnosis Date  . Anginal pain ([place])   . Anxiety    takes Celexa  . Arthritis    bil knees  . Benign prostatic hypertrophy    takes Flomax daily  . Bladder stones 06/25/2013   1.9cm bladder stone.  Marland Kitchen BPH  with urinary obstruction 06/25/2013   127cc prostate with large middle lobe and bladder stones.   . Cardiomyopathy ([place])   . CHF (congestive heart failure) ([place])   . Complication of anesthesia    hard to wake up  . Depression   . Diabetes mellitus without complication ([place])   . Dysrhythmia   . ED (erectile dysfunction)   . History of kidney stones   . Hyperlipidemia   . Hypertension   . PONV (postoperative nausea and vomiting)   . Sleep apnea    uses bipap machine  . Spinal headache    reports last spinal headache was yesterday 05-16-18, lasted for 2 hours a, relief with tylenol , denies vision change with yesterdays occurrence but wife endorses vision changes with past spinal headaches    . Type 2 DM with CKD stage 3 and hypertension ([place])     Past Surgical History:  Procedure Laterality Date  . CARDIAC CATHETERIZATION  06-15-14  . CATARACT EXTRACTION W/ INTRAOCULAR LENS  IMPLANT, BILATERAL  03/13/2003  . COLONOSCOPY    . CYSTOSCOPY W/ URETERAL STENT PLACEMENT Bilateral 01/08/2020   Procedure: CYSTOSCOPY WITH bilateral  RETROGRADE PYELOGRAM/ bilateral URETERAL STENT PLACEMENT;  Surgeon: Bjorn Loser, MD;  Location: [facility];  Service: Urology;  Laterality: Bilateral;  . CYSTOSCOPY WITH LITHOLAPAXY N/A 06/25/2013   Procedure: CYSTOSCOPY WITH LITHOLAPAXY;  Surgeon: [name], MD;  Location: [facility];  Service: Urology;  Laterality: N/A;  . CYSTOSCOPY/URETEROSCOPY/HOLMIUM LASER/STENT  PLACEMENT Bilateral 01/30/2020   Procedure: CYSTOSCOPY BILATERAL URETEROSCOPY WITH STENT EXCHANGE;  Surgeon: [name], MD;  Location: [facility];  Service: Urology;  Laterality: Bilateral;  . KIDNEY STONE SURGERY  01/2020  . KNEE ARTHROSCOPY  09/07/2010   left knee  . KNEE ARTHROSCOPY  05/27/2000   left knee  . LEFT HEART CATHETERIZATION WITH CORONARY ANGIOGRAM N/A 06/17/2014   Procedure: LEFT HEART CATHETERIZATION WITH CORONARY ANGIOGRAM;  Surgeon: Laverda Page, MD;  Location: [facility] CATH LAB;  Service:  Cardiovascular;  Laterality: N/A;  . RETINAL DETACHMENT REPAIR W/ SCLERAL BUCKLE LE  10/14/2002   Dr Zadie Rhine  . TONSILLECTOMY     age 71  . TOTAL KNEE ARTHROPLASTY  08/01/2011   Procedure: TOTAL KNEE ARTHROPLASTY;  Surgeon: Lorn Junes, MD;  Location: [place];  Service: Orthopedics;  Laterality: Right;  TOTAL KNEE ARTHROPLASTY  RIGHT SIDE  . TOTAL KNEE ARTHROPLASTY Left 05/22/2018   Procedure: LEFT TOTAL KNEE ARTHROPLASTY;  Surgeon: Paralee Cancel, MD;  Location: [facility];  Service: Orthopedics;  Laterality: Left;  . TRANSURETHRAL RESECTION OF PROSTATE N/A 06/25/2013   Procedure: TRANSURETHRAL RESECTION OF THE PROSTATE WITH GYRUS INSTRUMENTS;  Surgeon: [name], MD;  Location: [facility];  Service: Urology;  Laterality: N/A;  . VASECTOMY  1982   Family History  Problem Relation Age of Onset  . Hypertension Mother   . Heart failure Mother   . Heart failure Father   . Arthritis Other   . Cancer Other        breast, skin  . Coronary artery disease Other   . Heart disease Other   . Anesthesia problems Maternal Grandmother   . Hypotension Neg Hx   . Malignant hyperthermia Neg Hx   . Pseudochol deficiency Neg Hx    Social History   Tobacco Use  . Smoking status: Never Smoker  . Smokeless tobacco: Never Used  Substance Use Topics  . Alcohol use: Yes    Alcohol/week: 0.0 standard drinks    Comment: rare   Marital Status: Married   Review of Systems  Constitutional: Negative for malaise/fatigue and weight gain.  Cardiovascular: Positive for leg swelling (ankles, improved). Negative for chest pain, claudication, dyspnea on exertion, near-syncope, orthopnea, palpitations, paroxysmal nocturnal dyspnea and syncope.  Respiratory: Positive for snoring. Negative for shortness of breath.   Hematologic/Lymphatic: Does not bruise/bleed easily.  Gastrointestinal: Negative for melena.  Neurological: Negative for dizziness and weakness.   Objective:  There were no vitals taken for this visit.  There is no height or weight on file to calculate BMI.   Vitals with BMI 11/20/2020 11/20/2020 11/20/2020  Height - - -  Weight - - -  BMI - - -  Systolic 734 - 193  Diastolic 87 - 93  Pulse 67 66 62      Physical Exam Vitals reviewed.  Constitutional:      General: He is not in acute distress.    Appearance: He is well-developed. He is obese.     Comments: Well built and moderately obese  Cardiovascular:     Rate and Rhythm: Normal rate and regular rhythm.     Pulses: Normal pulses and intact distal pulses.     Heart sounds: Normal heart sounds.     Comments: No JVD. 1+ pitting edema bilateral ankles, left > right.   Pulmonary:     Effort: Pulmonary effort is normal. No accessory muscle usage or respiratory distress.     Breath sounds: Normal breath sounds.  Abdominal:  General: Abdomen is flat. There is no distension.     Palpations: Abdomen is soft.  Skin:    General: Skin is warm and dry.     Capillary Refill: Capillary refill takes less than 2 seconds.  Psychiatric:        Mood and Affect: Mood normal.        Behavior: Behavior normal.     Laboratory examination:   External labs: Lipid Panel completed 06/14/2018 HDL 43 MG/DL 06/14/2018 LDL 62.000 mg 06/14/2018 Cholesterol, total 129.000 m 06/14/2018 Triglycerides 122.000 06/14/2018  A1C 6.600 % 10/09/2019  Glucose Random 275.000 M 06/20/2019 MicroAlbumin Urine 28.000 06/21/2018 MicroAlbumin/Creat 24.4 MG/ 06/21/2018  BUN 21.000 M 06/20/2019 Creatinine, Serum 1.950 MG/ 06/20/2019  CMP Latest Ref Rng & Units 11/20/2020 09/17/2020 01/23/2020  Glucose 70 - 99 mg/dL 203(H) 172(H) 191(H)  BUN 8 - 23 mg/dL 34(H) 31(H) 31(H)  Creatinine 0.61 - 1.24 mg/dL 1.93(H) 2.12(H) 2.13(H)  Sodium 135 - 145 mmol/L 138 135 139  Potassium 3.5 - 5.1 mmol/L 4.4 5.0 4.7  Chloride 98 - 111 mmol/L 105 98 106  CO2 22 - 32 mmol/L 26 23 25   Calcium 8.9 - 10.3 mg/dL 9.2 9.1 9.0  Total Protein 6.5 - 8.1 g/dL 7.2 - -  Total Bilirubin 0.3 -  1.2 mg/dL 0.8 - -  Alkaline Phos 38 - 126 U/L 72 - -  AST 15 - 41 U/L 19 - -  ALT 0 - 44 U/L 23 - -   CBC Latest Ref Rng & Units 11/20/2020 01/23/2020 01/10/2020  WBC 4.0 - 10.5 K/uL 7.5 8.4 6.5  Hemoglobin 13.0 - 17.0 g/dL 13.2 12.3(L) 13.0  Hematocrit 39.0 - 52.0 % 40.5 38.7(L) 39.6  Platelets 150 - 400 K/uL 235 244 149(L)    Lipid Panel     Component Value Date/Time   CHOL 119 11/12/2009 0856   TRIG 75.0 11/12/2009 0856   TRIG 118 06/29/2006 1325   HDL 47.00 11/12/2009 0856   CHOLHDL 3 11/12/2009 0856   VLDL 15.0 11/12/2009 0856   LDLCALC 57 11/12/2009 0856   LDLDIRECT 141.0 01/30/2007 1455   HEMOGLOBIN A1C Lab Results  Component Value Date   HGBA1C 7.4 (H) 01/08/2020   MPG 165.68 01/08/2020   TSH No results for input(s): TSH in the last 8760 hours.  BNP    Component Value Date/Time   BNP 177.8 (H) 11/20/2020 1540    ProBNP No results found for: PROBNP   Allergies and Medications    Allergies  Allergen Reactions  . Penicillins Anaphylaxis    eyes swell shut  Has patient had a PCN reaction causing immediate rash, facial/tongue/throat swelling, SOB or lightheadedness with hypotension: yes Has patient had a PCN reaction causing severe rash involving mucus membranes or skin necrosis: no Has patient had a PCN reaction that required hospitalization no Has patient had a PCN reaction occurring within the last 10 years: no If all of the above answers are "NO", then may proceed with Cephalosporin use.  Judeth Cornfield Reductase Inhibitors      Current Outpatient Medications on File Prior to Visit  Medication Sig Dispense Refill  . Armodafinil 250 MG tablet Take 250 mg by mouth daily.    Marland Kitchen aspirin EC 81 MG tablet Take 81 mg by mouth daily.    Marland Kitchen atorvastatin (LIPITOR) 20 MG tablet Take 20 mg by mouth daily.    . Cholecalciferol (VITAMIN D) 2000 UNITS tablet Take 2,000 Units by mouth daily.    Marland Kitchen donepezil (ARICEPT) 5  MG tablet Take 5 mg by mouth at bedtime.    Marland Kitchen  escitalopram (LEXAPRO) 20 MG tablet Take 20 mg by mouth daily.     . finasteride (PROSCAR) 5 MG tablet Take 5 mg by mouth daily.    . furosemide (LASIX) 40 MG tablet Take 1 tablet (40 mg total) by mouth daily as needed (leg swelling). (Patient taking differently: Take 40 mg by mouth daily.) 30 tablet 2  . gabapentin (NEURONTIN) 100 MG capsule Take 3 capsules by mouth at bedtime.    . insulin degludec (TRESIBA FLEXTOUCH) 100 UNIT/ML FlexTouch Pen Inject 16 Units into the skin every morning.    . Metoprolol Tartrate 37.5 MG TABS Take 37.5 mg by mouth 2 (two) times daily. 180 tablet 3  . Omega-3 Fatty Acids (FISH OIL) 1000 MG CAPS Take 1,000 mg by mouth daily.    . solifenacin (VESICARE) 10 MG tablet Take 10 mg by mouth daily.    . trospium (SANCTURA) 20 MG tablet Take 20 mg by mouth 2 (two) times daily.     No current facility-administered medications on file prior to visit.     Radiology    No results found.  Cardiac Studies:   Coronary Angiogram  [06/17/2014]: Mild noncritical coronary artery disease involving 10-20% stenosis, mid rca 20%, normal LV systolic function.  ABI's [05/14/2014]: Normal bilateral ABI at 1.11 with normal triphasic waveform at the level of the ankle.  Lexiscan (Walking with mod Bruce)Tetrofosmin Stress Test  10/09/2019: Nondiagnostic ECG stress. Resting EKG/ECG demonstrated normal sinus rhythm. Left ventricular strain patterns present.  Peak EKG/ECG revealed no significant ST-T change from baseline abnormality. There is a fixed moderate defect in the inferior and apical regions consistent with scar versus diaphragmatic attenuation. Global hypokinesis with stress LV EF: 35%. LV is mildly dilated in both rest and stress images.  No significant change from 04/21/2014.  Intermediate risk study due to low LVEF.   24-hour Holter monitor 10/02/2019: Normal sinus rhythm.  Rare PAC and PVC.  PVC occurred both in the form of isolated PVCs and in bigeminal pattern.  No  atrial fibrillation was noted. No reported symptoms  Echocardiogram 09/01/2020:  Mildly depressed LV systolic function with visual EF 45-50%. Left ventricle cavity is normal in size. Hypokinetic global wall motion. Doppler evidence of grade I (impaired) diastolic dysfunction, normal LAP. Left atrial cavity is slightly dilated at 4.2 cm. Trileaflet aortic valve.  Mild (Grade I) aortic regurgitation. IVC is dilated with a respiratory response of <50%. May suggest mild elevation in CVP. No significant change from 09/25/2019.  EKG:   EKG 09/17/2020: Sinus rhythm with first-degree AV block at the rate of 69 bpm, leftward enlargement, left axis deviation, left anterior fascicular block.  Poor R wave progression, cannot exclude anteroseptal infarct old.  IVCD, borderline criteria for LVH.  Nonspecific T abnormality, cannot exclude inferior ischemia. No significant change from EKG 03/02/2020.  Assessment:   No diagnosis found.  No orders of the defined types were placed in this encounter.  There are no discontinued medications.  No orders of the defined types were placed in this encounter.    Recommendations:   Keith [name].  is a 75 y.o. male  with HTN, HLD, DM type 2, central sleep apnea, and stage 3 CKD, recently reevaluated by [place] for frequent PVCs seen during sleep study  echocardiogram in January 2021 revealing new cardiomyopathy with EF 40 to 45%, nuclear stress test confirmed nonischemic etiology with reduced EF at 35%.  Patient  also has had normal coronary arteries by angiography in 2015.  24-hour Holter monitor revealed occasional PVCs.  ***Patient presents for 38-month follow-up of hypertension and heart failure.  At last visit patient was taking both metoprolol and carvedilol, therefore advised patient to discontinue carvedilol.  ***home BP?   ***  Patient presents for 4-week follow-up of heart failure and hypertension.  There are no clinical signs of acute decompensated heart  failure at this time, no JVD and leg edema has significantly improved since last visit.  Patient is presently taking furosemide 40 mg once daily, advised him to switch to taking as needed for volume overload.  Discussed with patient and his wife regarding as needed use of furosemide, they both verbalized understanding and agreement.  Notably patient been taking both metoprolol and carvedilol, will stop carvedilol and continue metoprolol titrate 37.5 mg twice daily.  In regard to hypertension, in the office today blood pressure is well controlled, however, patient will be stopping carvedilol. Advised patient to monitor his blood pressure daily and bring a written log to his next office visit in order to reevaluate blood pressure control. Patient will notify our office if home blood pressure readings consistently >130/80 mmHg.  No other changes were made to his cardiovascular medications today. Will repeat BMP to monitor renal function with daily furosemide dosing over the last month.   Follow up in 3 months, sooner if needed, for hypertension, heart failure. If he remains stable can consider follow up in 6 months at that time.    Alethia Berthold, PA-C 01/12/2021, 12:03 PM Office: [phone]

## 2021-01-13 ENCOUNTER — Other Ambulatory Visit: Payer: Self-pay

## 2021-01-13 ENCOUNTER — Encounter: Payer: Self-pay | Admitting: Student

## 2021-01-13 ENCOUNTER — Ambulatory Visit: Payer: Medicare Other | Admitting: Student

## 2021-01-13 VITALS — BP 136/66 | HR 63 | Temp 98.3°F | Resp 16 | Ht 72.0 in | Wt 272.2 lb

## 2021-01-13 DIAGNOSIS — I5032 Chronic diastolic (congestive) heart failure: Secondary | ICD-10-CM

## 2021-01-13 DIAGNOSIS — I10 Essential (primary) hypertension: Secondary | ICD-10-CM

## 2021-01-13 DIAGNOSIS — I428 Other cardiomyopathies: Secondary | ICD-10-CM

## 2021-01-13 MED ORDER — POTASSIUM CHLORIDE CRYS ER 20 MEQ PO TBCR: 20.0000 meq | EXTENDED_RELEASE_TABLET | Freq: Every day | ORAL | 3 refills | Status: DC

## 2021-01-13 NOTE — Progress Notes (Signed)
Primary Physician:  Leanna Battles, MD   Patient ID: Keith [name]., male    DOB: March 22, 1946, 75 y.o.   MRN: [id number]  Subjective:    Chief Complaint  Patient presents with  . Hypertension  . Congestive Heart Failure  . Follow-up    3 months    HPI: Keith [name].  is a 75 y.o. male  with HTN, HLD, DM type 2, central sleep apnea, and stage 3 CKD,  frequent PVCs seen during sleep study  echocardiogram in January 2021 revealing new cardiomyopathy with EF 40 to 45%, nuclear stress test confirmed nonischemic etiology with reduced EF at 35%.  Patient also has had normal coronary arteries by angiography in 2015.  24-hour Holter monitor revealed occasional PVCs.   Patient presents for 56-month follow-up of hypertension and heart failure.  At last visit patient was taking both metoprolol and carvedilol, therefore advised patient to discontinue carvedilol.  Patient was seen in the emergency department 11/20/2020 with complaints of shortness of breath.  He had not taken Lasix therefore was given a dose of Lasix in the emergency department diuresed well with stable for discharge home.  Patient states today he is feeling well without specific complaints.  He is currently taking furosemide 40 mg daily with no doses as needed.  Patient's bilateral lower leg swelling remained stable.  He denies chest pain, dyspnea, orthopnea, PND, palpitations, syncope, near syncope, dizziness.  Patient has recently moved to [facility] approximately 1 month ago, and has not been monitoring his blood pressure on a regular basis since then.  Patient is presently taking metoprolol titrate 3.75 mg once daily as opposed to twice daily as directed.  Notably patient plans to establish care with cardiologist in [place].  Past Medical History:  Diagnosis Date  . Anginal pain ([place])   . Anxiety    takes Celexa  . Arthritis    bil knees  . Benign prostatic hypertrophy    takes Flomax daily  . Bladder stones 06/25/2013    1.9cm bladder stone.  Marland Kitchen BPH with urinary obstruction 06/25/2013   127cc prostate with large middle lobe and bladder stones.   . Cardiomyopathy ([place])   . CHF (congestive heart failure) ([place])   . Complication of anesthesia    hard to wake up  . Depression   . Diabetes mellitus without complication ([place])   . Dysrhythmia   . ED (erectile dysfunction)   . History of kidney stones   . Hyperlipidemia   . Hypertension   . PONV (postoperative nausea and vomiting)   . Sleep apnea    uses bipap machine  . Spinal headache    reports last spinal headache was yesterday 05-16-18, lasted for 2 hours a, relief with tylenol , denies vision change with yesterdays occurrence but wife endorses vision changes with past spinal headaches    . Type 2 DM with CKD stage 3 and hypertension ([place])     Past Surgical History:  Procedure Laterality Date  . CARDIAC CATHETERIZATION  06-15-14  . CATARACT EXTRACTION W/ INTRAOCULAR LENS  IMPLANT, BILATERAL  03/13/2003  . COLONOSCOPY    . CYSTOSCOPY W/ URETERAL STENT PLACEMENT Bilateral 01/08/2020   Procedure: CYSTOSCOPY WITH bilateral  RETROGRADE PYELOGRAM/ bilateral URETERAL STENT PLACEMENT;  Surgeon: Bjorn Loser, MD;  Location: [facility];  Service: Urology;  Laterality: Bilateral;  . CYSTOSCOPY WITH LITHOLAPAXY N/A 06/25/2013   Procedure: CYSTOSCOPY WITH LITHOLAPAXY;  Surgeon: [name], MD;  Location: [facility];  Service: Urology;  Laterality:  N/A;  . CYSTOSCOPY/URETEROSCOPY/HOLMIUM LASER/STENT PLACEMENT Bilateral 01/30/2020   Procedure: CYSTOSCOPY BILATERAL URETEROSCOPY WITH STENT EXCHANGE;  Surgeon: [name], MD;  Location: [facility];  Service: Urology;  Laterality: Bilateral;  . KIDNEY STONE SURGERY  01/2020  . KNEE ARTHROSCOPY  09/07/2010   left knee  . KNEE ARTHROSCOPY  05/27/2000   left knee  . LEFT HEART CATHETERIZATION WITH CORONARY ANGIOGRAM N/A 06/17/2014   Procedure: LEFT HEART CATHETERIZATION WITH CORONARY ANGIOGRAM;  Surgeon: Laverda Page, MD;   Location: [facility] CATH LAB;  Service: Cardiovascular;  Laterality: N/A;  . RETINAL DETACHMENT REPAIR W/ SCLERAL BUCKLE LE  10/14/2002   Dr Zadie Rhine  . TONSILLECTOMY     age 83  . TOTAL KNEE ARTHROPLASTY  08/01/2011   Procedure: TOTAL KNEE ARTHROPLASTY;  Surgeon: Lorn Junes, MD;  Location: [place];  Service: Orthopedics;  Laterality: Right;  TOTAL KNEE ARTHROPLASTY  RIGHT SIDE  . TOTAL KNEE ARTHROPLASTY Left 05/22/2018   Procedure: LEFT TOTAL KNEE ARTHROPLASTY;  Surgeon: Paralee Cancel, MD;  Location: [facility];  Service: Orthopedics;  Laterality: Left;  . TRANSURETHRAL RESECTION OF PROSTATE N/A 06/25/2013   Procedure: TRANSURETHRAL RESECTION OF THE PROSTATE WITH GYRUS INSTRUMENTS;  Surgeon: [name], MD;  Location: [facility];  Service: Urology;  Laterality: N/A;  . VASECTOMY  1982   Family History  Problem Relation Age of Onset  . Hypertension Mother   . Heart failure Mother   . Heart failure Father   . Arthritis Other   . Cancer Other        breast, skin  . Coronary artery disease Other   . Heart disease Other   . Anesthesia problems Maternal Grandmother   . Hypotension Neg Hx   . Malignant hyperthermia Neg Hx   . Pseudochol deficiency Neg Hx    Social History   Tobacco Use  . Smoking status: Never Smoker  . Smokeless tobacco: Never Used  Substance Use Topics  . Alcohol use: Yes    Alcohol/week: 0.0 standard drinks    Comment: rare   Marital Status: Married   Review of Systems  Constitutional: Negative for malaise/fatigue and weight gain.  Cardiovascular: Positive for leg swelling (ankles, stable). Negative for chest pain, claudication, dyspnea on exertion, near-syncope, orthopnea, palpitations, paroxysmal nocturnal dyspnea and syncope.  Respiratory: Positive for snoring. Negative for shortness of breath.   Hematologic/Lymphatic: Does not bruise/bleed easily.  Gastrointestinal: Negative for melena.  Neurological: Negative for dizziness and weakness.   Objective:  Blood pressure  136/66, pulse 63, temperature 98.3 F (36.8 C), temperature source Temporal, resp. rate 16, height 6' (1.829 m), weight 272 lb 3.2 oz (123.5 kg), SpO2 98 %. Body mass index is 36.92 kg/m.   Vitals with BMI 01/13/2021 11/20/2020 11/20/2020  Height 6\' 0"  - -  Weight 272 lbs 3 oz - -  BMI 67.89 - -  Systolic 381 017 -  Diastolic 66 87 -  Pulse 63 67 66      Physical Exam Vitals reviewed.  Constitutional:      General: He is not in acute distress.    Appearance: He is well-developed. He is obese.     Comments: Well built and moderately obese  Cardiovascular:     Rate and Rhythm: Normal rate and regular rhythm.     Pulses: Normal pulses and intact distal pulses.     Heart sounds: Normal heart sounds.     Comments: No JVD.   Pulmonary:     Effort: Pulmonary effort is normal.  No accessory muscle usage or respiratory distress.     Breath sounds: Normal breath sounds.  Abdominal:     General: Bowel sounds are normal.  Musculoskeletal:     Right lower leg: Edema (trace) present.     Left lower leg: Edema (trace) present.  Skin:    General: Skin is warm and dry.     Capillary Refill: Capillary refill takes less than 2 seconds.  Neurological:     Mental Status: He is alert.  Psychiatric:        Mood and Affect: Mood normal.        Behavior: Behavior normal.     Laboratory examination:   External labs: Lipid Panel completed 06/14/2018 HDL 43 MG/DL 06/14/2018 LDL 62.000 mg 06/14/2018 Cholesterol, total 129.000 m 06/14/2018 Triglycerides 122.000 06/14/2018  A1C 6.600 % 10/09/2019  Glucose Random 275.000 M 06/20/2019 MicroAlbumin Urine 28.000 06/21/2018 MicroAlbumin/Creat 24.4 MG/ 06/21/2018  BUN 21.000 M 06/20/2019 Creatinine, Serum 1.950 MG/ 06/20/2019  CMP Latest Ref Rng & Units 11/20/2020 09/17/2020 01/23/2020  Glucose 70 - 99 mg/dL 203(H) 172(H) 191(H)  BUN 8 - 23 mg/dL 34(H) 31(H) 31(H)  Creatinine 0.61 - 1.24 mg/dL 1.93(H) 2.12(H) 2.13(H)  Sodium 135 - 145 mmol/L 138 135 139   Potassium 3.5 - 5.1 mmol/L 4.4 5.0 4.7  Chloride 98 - 111 mmol/L 105 98 106  CO2 22 - 32 mmol/L 26 23 25   Calcium 8.9 - 10.3 mg/dL 9.2 9.1 9.0  Total Protein 6.5 - 8.1 g/dL 7.2 - -  Total Bilirubin 0.3 - 1.2 mg/dL 0.8 - -  Alkaline Phos 38 - 126 U/L 72 - -  AST 15 - 41 U/L 19 - -  ALT 0 - 44 U/L 23 - -   CBC Latest Ref Rng & Units 11/20/2020 01/23/2020 01/10/2020  WBC 4.0 - 10.5 K/uL 7.5 8.4 6.5  Hemoglobin 13.0 - 17.0 g/dL 13.2 12.3(L) 13.0  Hematocrit 39.0 - 52.0 % 40.5 38.7(L) 39.6  Platelets 150 - 400 K/uL 235 244 149(L)    Lipid Panel     Component Value Date/Time   CHOL 119 11/12/2009 0856   TRIG 75.0 11/12/2009 0856   TRIG 118 06/29/2006 1325   HDL 47.00 11/12/2009 0856   CHOLHDL 3 11/12/2009 0856   VLDL 15.0 11/12/2009 0856   LDLCALC 57 11/12/2009 0856   LDLDIRECT 141.0 01/30/2007 1455   HEMOGLOBIN A1C Lab Results  Component Value Date   HGBA1C 7.4 (H) 01/08/2020   MPG 165.68 01/08/2020   TSH No results for input(s): TSH in the last 8760 hours.  BNP    Component Value Date/Time   BNP 177.8 (H) 11/20/2020 1540    ProBNP No results found for: PROBNP   Allergies and Medications    Allergies  Allergen Reactions  . Penicillins Anaphylaxis    eyes swell shut  Has patient had a PCN reaction causing immediate rash, facial/tongue/throat swelling, SOB or lightheadedness with hypotension: yes Has patient had a PCN reaction causing severe rash involving mucus membranes or skin necrosis: no Has patient had a PCN reaction that required hospitalization no Has patient had a PCN reaction occurring within the last 10 years: no If all of the above answers are "NO", then may proceed with Cephalosporin use.  Judeth Cornfield Reductase Inhibitors      Current Outpatient Medications on File Prior to Visit  Medication Sig Dispense Refill  . ALPRAZolam (XANAX) 0.25 MG tablet Take 1 tablet by mouth 2 (two) times daily as needed.    Marland Kitchen  Armodafinil 250 MG tablet Take 250 mg by  mouth daily.    Marland Kitchen aspirin EC 81 MG tablet Take 81 mg by mouth daily.    Marland Kitchen atorvastatin (LIPITOR) 20 MG tablet Take 20 mg by mouth daily.    . Cholecalciferol (VITAMIN D) 2000 UNITS tablet Take 2,000 Units by mouth daily.    Marland Kitchen donepezil (ARICEPT) 5 MG tablet Take 5 mg by mouth at bedtime.    Marland Kitchen escitalopram (LEXAPRO) 20 MG tablet Take 20 mg by mouth daily.     . finasteride (PROSCAR) 5 MG tablet Take 5 mg by mouth daily.    . furosemide (LASIX) 40 MG tablet Take 1 tablet (40 mg total) by mouth daily as needed (leg swelling). (Patient taking differently: Take 40 mg by mouth daily.) 30 tablet 2  . gabapentin (NEURONTIN) 100 MG capsule Take 3 capsules by mouth at bedtime.    . insulin degludec (TRESIBA FLEXTOUCH) 100 UNIT/ML FlexTouch Pen Inject 16 Units into the skin every morning.    . Metoprolol Tartrate 37.5 MG TABS Take 37.5 mg by mouth 2 (two) times daily. 180 tablet 3  . Omega-3 Fatty Acids (FISH OIL) 1000 MG CAPS Take 1,000 mg by mouth daily.    . solifenacin (VESICARE) 10 MG tablet Take 10 mg by mouth daily.    . trospium (SANCTURA) 20 MG tablet Take 20 mg by mouth 2 (two) times daily.    . clopidogrel (PLAVIX) 75 MG tablet Take 1 tablet by mouth daily.    Marland Kitchen MYRBETRIQ 50 MG TB24 tablet Take 50 mg by mouth daily.     No current facility-administered medications on file prior to visit.     Radiology    No results found.  Cardiac Studies:   Coronary Angiogram  [06/17/2014]: Mild noncritical coronary artery disease involving 10-20% stenosis, mid rca 20%, normal LV systolic function.  ABI's [05/14/2014]: Normal bilateral ABI at 1.11 with normal triphasic waveform at the level of the ankle.  Lexiscan (Walking with mod Bruce)Tetrofosmin Stress Test  10/09/2019: Nondiagnostic ECG stress. Resting EKG/ECG demonstrated normal sinus rhythm. Left ventricular strain patterns present.  Peak EKG/ECG revealed no significant ST-T change from baseline abnormality. There is a fixed moderate defect  in the inferior and apical regions consistent with scar versus diaphragmatic attenuation. Global hypokinesis with stress LV EF: 35%. LV is mildly dilated in both rest and stress images.  No significant change from 04/21/2014.  Intermediate risk study due to low LVEF.   24-hour Holter monitor 10/02/2019: Normal sinus rhythm.  Rare PAC and PVC.  PVC occurred both in the form of isolated PVCs and in bigeminal pattern.  No atrial fibrillation was noted. No reported symptoms  Echocardiogram 09/01/2020:  Mildly depressed LV systolic function with visual EF 45-50%. Left ventricle cavity is normal in size. Hypokinetic global wall motion. Doppler evidence of grade I (impaired) diastolic dysfunction, normal LAP. Left atrial cavity is slightly dilated at 4.2 cm. Trileaflet aortic valve.  Mild (Grade I) aortic regurgitation. IVC is dilated with a respiratory response of <50%. May suggest mild elevation in CVP. No significant change from 09/25/2019.  EKG:   EKG 09/17/2020: Sinus rhythm with first-degree AV block at the rate of 69 bpm, leftward enlargement, left axis deviation, left anterior fascicular block.  Poor R wave progression, cannot exclude anteroseptal infarct old.  IVCD, borderline criteria for LVH.  Nonspecific T abnormality, cannot exclude inferior ischemia. No significant change from EKG 03/02/2020.  Assessment:     ICD-10-CM   1. Primary  hypertension  N98 Basic metabolic panel  2. Chronic diastolic heart failure (HCC)  I50.32     Meds ordered this encounter  Medications  . potassium chloride SA (KLOR-CON) 20 MEQ tablet    Sig: Take 1 tablet (20 mEq total) by mouth daily.    Dispense:  90 tablet    Refill:  3   Medications Discontinued During This Encounter  Medication Reason  . metoprolol succinate (TOPROL-XL) 25 MG 24 hr tablet Discontinued by provider    Orders Placed This Encounter  Procedures  . Basic metabolic panel     Recommendations:   Delynn Pursley.  is a 75 y.o.  male  with HTN, HLD, DM type 2, central sleep apnea, and stage 3 CKD, recently reevaluated by [place] for frequent PVCs seen during sleep study  echocardiogram in January 2021 revealing new cardiomyopathy with EF 40 to 45%, nuclear stress test confirmed nonischemic etiology with reduced EF at 35%.  Patient also has had normal coronary arteries by angiography in 2015.  24-hour Holter monitor revealed occasional PVCs.  Patient presents for 81-month follow-up of hypertension and heart failure.  At last visit patient was taking both metoprolol and carvedilol, therefore advised patient to discontinue carvedilol.  Patient has been taking metoprolol tartrate 37.5 mg once daily instead of twice daily as directed.  Advised patient to increase to twice daily, he verbalized understanding agreement.  Patient's blood pressure was initially elevated in the office today, improved upon recheck but remains mildly elevated.  Ideally additional dose of metoprolol tartrate will improve blood pressure control.  Advised patient to continue to monitor his blood pressure daily at home and to notify our office if it remains >130/80 mmHg.  We will also repeat BMP at this time.  Patient has been taking Lasix on a daily basis, and has not been taking potassium supplement.  I have prescribed potassium supplement at this time I discussed with patient regarding use, he verbalized understanding agreement. There are no clinical signs of   Patient has recently moved to [facility] and therefore plan to establish with cardiology office in that area.  Advised patient to notify our office when he is establish care and we will be happy to send records.  As far as follow-up in our office goes, we will plan to see him only as needed.   Alethia Berthold, PA-C 01/13/2021, 1:34 PM Office: [phone]

## 2021-01-14 LAB — BASIC METABOLIC PANEL
BUN/Creatinine Ratio: 18 (ref 10–24)
BUN: 38 mg/dL — ABNORMAL HIGH (ref 8–27)
CO2: 19 mmol/L — ABNORMAL LOW (ref 20–29)
Calcium: 9.4 mg/dL (ref 8.6–10.2)
Chloride: 100 mmol/L (ref 96–106)
Creatinine, Ser: 2.1 mg/dL — ABNORMAL HIGH (ref 0.76–1.27)
Glucose: 264 mg/dL — ABNORMAL HIGH (ref 65–99)
Potassium: 5 mmol/L (ref 3.5–5.2)
Sodium: 136 mmol/L (ref 134–144)
eGFR: 32 mL/min/{1.73_m2} — ABNORMAL LOW (ref >59)

## 2021-01-14 NOTE — Progress Notes (Signed)
Please inform patient his renal function has gone down compared to last month. Please advise him to cut furosemide back to as needed for swelling and SOB instead of daily. Also advise him to to be sure to follow up with PCP or cardiology in [place] to continue to monitor.

## 2021-01-15 NOTE — Progress Notes (Signed)
Called pt to inform him about hi ab results. Pt understood

## 2021-02-12 ENCOUNTER — Ambulatory Visit: Payer: Medicare Other | Admitting: Podiatry

## 2021-04-05 ENCOUNTER — Encounter: Payer: Self-pay | Admitting: Family Medicine

## 2021-04-05 ENCOUNTER — Ambulatory Visit: Payer: Medicare Other | Admitting: Family Medicine

## 2021-04-05 NOTE — Progress Notes (Deleted)
PATIENT: Keith [name]. DOB: [birth year]  REASON FOR VISIT: follow up HISTORY FROM: patient  No chief complaint on file.    HISTORY OF PRESENT ILLNESS: 04/05/2021 ALL: Demani returns for follow up for complex sleep apnea on BiPAP.     10/05/2020 ALL:  Keith [name]. is a 75 y.o. male here today for follow up for complex sleep apnea on BiHe has continued to work on BiPAP compliance.  He states that BiPAP works well most days.  He has attempted to reach out to his DME company to request a new mask and headgear.  Current headgear is causing irritation and a rash to the back of his head.  He is having to use padding and has duct tape to hold headgear in place.  He reports being told about 6 months ago that there were no available mask or headgear to send at that time.  He has not heard anything since.  He is becoming more accustomed to using BiPAP therapy.  He admits that there are days where he does not use it for a full 4 hours.  Compliance report dated 09/06/2020 through 10/05/2020 reveals that he used BiPAP 27 of the past 30 days for compliance of 90%.  He used BiPAP greater than 4 hours 20 of the past 30 days for compliance of 67%.  Average usage on days used was 6 hours and 2 minutes.  Residual AHI was 6.6 with IPAP of 19 and EPAP of 15 cm of water.  Respiratory rate of 12.  Leak has improved and now in the 95th percentile of 30.9 L/min.   HISTORY: (copied from previous notes)  Today 04/02/20 ALL: Keith [name]. is a 75 y.o. male here today for follow up for complex sleep apnea treated with BiPAP. He is using BiPAP most night. He continues to note a leak. He is happy with his full face mask but reports his head gear is loose. It has not been changed in many months. He denies concerns with BiPAP machine.    Complaince report dated 03/03/2020-04/01/2020 reveals daily compliance of 93% and four hour compliance of 90%. Residual AHI 5.5 on IPAP 19cmH20 and EPAP of 15cmH20. Leak noted  in the 95% of 67.3L/min.     HISTORY: (copied from my note on 12/18/2019)   Keith [name]. is a 75 y.o. male here today for follow up for primary central sleep apnea recently restarted on BiPAP therapy. Sleep study in 07/2019 revealed "severe central and obstructive sleep apnea, with a primary central component, total AHI of 47.7/hour, O2 nadir of 79%". Titration study confirmed adequate treatment with BiPAP therapy. He is doing well on BiPAP therapy.  He does feel that he is sleeping better.  He has noted a leak in his mask.  He is currently using a full facemask.  He does like the mask he is using and is willing to continue adjusting his headgear to ensure appropriate fit of mask. He does see cardiology regularly.  Lexiscan stress test on 10/09/2019 was stable from 2015.  Holter monitor on 10/02/2019 did reveal rare PACs and PVCs both in isolated and bigeminal pattern.  Atrial fibrillation was not noted.   Compliance report dated 11/18/2019 3 12/17/2019 reveals that he has used BiPAP therapy 28 of the last 30 days for compliance of 93%.  He used BiPAP greater than 4 hours 26 of the last 30 days for compliance of 87%.  Average usage was 7 hours  and 9 minutes.  Residual AHI was 5.0 with IPAP of 19 cm of water and EPAP of 15 cm of water.     HISTORY: (copied from Dr Guadelupe Sabin note on 07/03/2019)   Dear Dr. Philip Aspen, I saw your patient, Wayman Hoard, upon your kind request in my sleep clinic today for reevaluation of his obstructive sleep apnea, on treatment with BiPAP.  The patient is accompanied by his wife today.  I have previously followed him for his obstructive sleep apnea and recurrent headaches.  I last saw him over 3 years ago at which time he was compliant with his BiPAP ST and he was taking Topamax low-dose twice daily for headache prevention.  As you know, Mr. Shuey is a 75 year old right-handed gentleman with an underlying medical history of type 2 diabetes, osteoarthritis, hyperlipidemia,  depression, allergic rhinitis, kidney stones, recurrent headaches, status post retinal detachment surgery, status post right, then left knee replacements, status post TURP, and obesity, who has been on BiPAP ST for his sleep apnea for years.  He had sleep study testing through our office on 04/29/2014 which was a split-night sleep study and a subsequent titration study in September 2015.  I reviewed his BiPAP compliance data.  Out of the last 30 days he used his machine only 12 days, residual AHI around 8.1/h.  Average usage for days on treatment of 4 hours and 10 minutes, leak on the high side with a 95th percentile at 55 L/min, pressure of 20/16 with a backup rate of 12.  In the past 90 days, he used his machine 32 days.  Residual AHI around 8.4.  He reports that he is trying to get back on the machine, he finds it uncomfortable.  He uses a full facemask.  In the past 9 days he has consistently tried to use his machine.  His wife reports that he does not snore when he sleeps with the BiPAP and seems to be less restless, less twitching noted by wife.  He feels that the settings are not optimal, he had stopped using his machine because he had been able to lose weight but he gained some weight back.  When he started gaining weight again, his wife noticed recurrence of snoring.  He would be willing to get retested for his sleep apnea and consider consistently using a CPAP or BiPAP machine.  He would be eligible for a new machine since his set up date was 05/12/2014.  His Epworth sleepiness score is 9 out of 24, fatigue severity score is 62 out of 63.  His sleep is interrupted.  He has nocturia about once per average night.  He generally goes to bed around 10, rise time is between 8 and 9.  He has to get up at 4 to let the 2 dogs out, then he has to feed him at 5 AM.  He has had morning headaches at times.  He has limited his caffeine intake to 1 cup of chai tea per day, occasional soda or occasional ice tea.  He does  not smoke cigarettes, he smokes a cigar a month approximately. He drinks alcohol rarely.    REVIEW OF SYSTEMS: Out of a complete 14 system review of symptoms, the patient complains only of the following symptoms, none and all other reviewed systems are negative.  ESS: 7 FSS: 35  ALLERGIES: Allergies  Allergen Reactions   Penicillins Anaphylaxis    eyes swell shut  Has patient had a PCN reaction causing immediate rash, facial/tongue/throat  swelling, SOB or lightheadedness with hypotension: yes Has patient had a PCN reaction causing severe rash involving mucus membranes or skin necrosis: no Has patient had a PCN reaction that required hospitalization no Has patient had a PCN reaction occurring within the last 10 years: no If all of the above answers are "NO", then may proceed with Cephalosporin use.   5-Alpha Reductase Inhibitors     HOME MEDICATIONS: Outpatient Medications Prior to Visit  Medication Sig Dispense Refill   ALPRAZolam (XANAX) 0.25 MG tablet Take 1 tablet by mouth 2 (two) times daily as needed.     Armodafinil 250 MG tablet Take 250 mg by mouth daily.     aspirin EC 81 MG tablet Take 81 mg by mouth daily.     atorvastatin (LIPITOR) 20 MG tablet Take 20 mg by mouth daily.     Cholecalciferol (VITAMIN D) 2000 UNITS tablet Take 2,000 Units by mouth daily.     clopidogrel (PLAVIX) 75 MG tablet Take 1 tablet by mouth daily.     donepezil (ARICEPT) 5 MG tablet Take 5 mg by mouth at bedtime.     escitalopram (LEXAPRO) 20 MG tablet Take 20 mg by mouth daily.      finasteride (PROSCAR) 5 MG tablet Take 5 mg by mouth daily.     furosemide (LASIX) 40 MG tablet Take 1 tablet (40 mg total) by mouth daily as needed (leg swelling). (Patient taking differently: Take 40 mg by mouth daily.) 30 tablet 2   gabapentin (NEURONTIN) 100 MG capsule Take 3 capsules by mouth at bedtime.     insulin degludec (TRESIBA FLEXTOUCH) 100 UNIT/ML FlexTouch Pen Inject 16 Units into the skin every  morning.     Metoprolol Tartrate 37.5 MG TABS Take 37.5 mg by mouth 2 (two) times daily. 180 tablet 3   MYRBETRIQ 50 MG TB24 tablet Take 50 mg by mouth daily.     Omega-3 Fatty Acids (FISH OIL) 1000 MG CAPS Take 1,000 mg by mouth daily.     potassium chloride SA (KLOR-CON) 20 MEQ tablet Take 1 tablet (20 mEq total) by mouth daily. 90 tablet 3   solifenacin (VESICARE) 10 MG tablet Take 10 mg by mouth daily.     trospium (SANCTURA) 20 MG tablet Take 20 mg by mouth 2 (two) times daily.     No facility-administered medications prior to visit.    PAST MEDICAL HISTORY: Past Medical History:  Diagnosis Date   Anginal pain ([place])    Anxiety    takes Celexa   Arthritis    bil knees   Benign prostatic hypertrophy    takes Flomax daily   Bladder stones 06/25/2013   1.9cm bladder stone.   BPH with urinary obstruction 06/25/2013   127cc prostate with large middle lobe and bladder stones.    Cardiomyopathy ([place])    CHF (congestive heart failure) (HCC)    Complication of anesthesia    hard to wake up   Depression    Diabetes mellitus without complication ([place])    Dysrhythmia    ED (erectile dysfunction)    History of kidney stones    Hyperlipidemia    Hypertension    PONV (postoperative nausea and vomiting)    Sleep apnea    uses bipap machine   Spinal headache    reports last spinal headache was yesterday 05-16-18, lasted for 2 hours a, relief with tylenol , denies vision change with yesterdays occurrence but wife endorses vision changes with past spinal headaches  Type 2 DM with CKD stage 3 and hypertension (HCC)     PAST SURGICAL HISTORY: Past Surgical History:  Procedure Laterality Date   CARDIAC CATHETERIZATION  06-15-14   CATARACT EXTRACTION W/ INTRAOCULAR LENS  IMPLANT, BILATERAL  03/13/2003   COLONOSCOPY     CYSTOSCOPY W/ URETERAL STENT PLACEMENT Bilateral 01/08/2020   Procedure: CYSTOSCOPY WITH bilateral  RETROGRADE PYELOGRAM/ bilateral URETERAL STENT PLACEMENT;  Surgeon:  Bjorn Loser, MD;  Location: [facility];  Service: Urology;  Laterality: Bilateral;   CYSTOSCOPY WITH LITHOLAPAXY N/A 06/25/2013   Procedure: CYSTOSCOPY WITH LITHOLAPAXY;  Surgeon: [name], MD;  Location: [facility];  Service: Urology;  Laterality: N/A;   CYSTOSCOPY/URETEROSCOPY/HOLMIUM LASER/STENT PLACEMENT Bilateral 01/30/2020   Procedure: CYSTOSCOPY BILATERAL URETEROSCOPY WITH STENT EXCHANGE;  Surgeon: [name], MD;  Location: [facility];  Service: Urology;  Laterality: Bilateral;   KIDNEY STONE SURGERY  01/2020   KNEE ARTHROSCOPY  09/07/2010   left knee   KNEE ARTHROSCOPY  05/27/2000   left knee   LEFT HEART CATHETERIZATION WITH CORONARY ANGIOGRAM N/A 06/17/2014   Procedure: LEFT HEART CATHETERIZATION WITH CORONARY ANGIOGRAM;  Surgeon: Laverda Page, MD;  Location: [facility] CATH [facility];  Service: Cardiovascular;  Laterality: N/A;   RETINAL DETACHMENT REPAIR W/ SCLERAL BUCKLE LE  10/14/2002   Dr Zadie Rhine   TONSILLECTOMY     age 6   TOTAL KNEE ARTHROPLASTY  08/01/2011   Procedure: TOTAL KNEE ARTHROPLASTY;  Surgeon: Lorn Junes, MD;  Location: [facility];  Service: Orthopedics;  Laterality: Right;  TOTAL KNEE ARTHROPLASTY  RIGHT SIDE   TOTAL KNEE ARTHROPLASTY Left 05/22/2018   Procedure: LEFT TOTAL KNEE ARTHROPLASTY;  Surgeon: Paralee Cancel, MD;  Location: [facility];  Service: Orthopedics;  Laterality: Left;   TRANSURETHRAL RESECTION OF PROSTATE N/A 06/25/2013   Procedure: TRANSURETHRAL RESECTION OF THE PROSTATE WITH GYRUS INSTRUMENTS;  Surgeon: [name], MD;  Location: [facility];  Service: Urology;  Laterality: N/A;   VASECTOMY  1982    FAMILY HISTORY: Family History  Problem Relation Age of Onset   Hypertension Mother    Heart failure Mother    Heart failure Father    Arthritis Other    Cancer Other        breast, skin   Coronary artery disease Other    Heart disease Other    Anesthesia problems Maternal Grandmother    Hypotension Neg Hx    Malignant hyperthermia Neg Hx    Pseudochol  deficiency Neg Hx     SOCIAL HISTORY: Social History   Socioeconomic History   Marital status: Married    Spouse name: Not on file   Number of children: 2   Years of education: Not on file   Highest education level: Not on file  Occupational History   Not on file  Tobacco Use   Smoking status: Never   Smokeless tobacco: Never  Vaping Use   Vaping Use: Never used  Substance and Sexual Activity   Alcohol use: Yes    Alcohol/week: 0.0 standard drinks    Comment: rare    Drug use: No   Sexual activity: Not Currently  Other Topics Concern   Not on file  Social History Narrative   Right handed.    Social Determinants of Health   Financial Resource Strain: Not on file  Food Insecurity: Not on file  Transportation Needs: Not on file  Physical Activity: Not on file  Stress: Not on file  Social Connections: Not on file  Intimate Partner Violence: Not  on file     PHYSICAL EXAM  There were no vitals filed for this visit.  There is no height or weight on file to calculate BMI.  Generalized: Well developed, in no acute distress  Cardiology: normal rate and rhythm, no murmur noted Respiratory: clear to auscultation bilaterally  Neurological examination  Mentation: Alert oriented to time, place, history taking. Follows all commands speech and language fluent Cranial nerve II-XII: Pupils were equal round reactive to light. Extraocular movements were full, visual field were full  Motor: The motor testing reveals 5 over 5 strength of all 4 extremities. Good symmetric motor tone is noted throughout.  Gait and station: Gait is normal.    DIAGNOSTIC DATA (LABS, IMAGING, TESTING) - I reviewed patient records, labs, notes, testing and imaging myself where available.  No flowsheet data found.   Lab Results  Component Value Date   WBC 7.5 11/20/2020   HGB 13.2 11/20/2020   HCT 40.5 11/20/2020   MCV 97.6 11/20/2020   PLT 235 11/20/2020      Component Value Date/Time   NA  136 01/13/2021 1321   K 5.0 01/13/2021 1321   CL 100 01/13/2021 1321   CO2 19 (L) 01/13/2021 1321   GLUCOSE 264 (H) 01/13/2021 1321   GLUCOSE 203 (H) 11/20/2020 1540   GLUCOSE 139 (H) 06/29/2006 1325   BUN 38 (H) 01/13/2021 1321   CREATININE 2.10 (H) 01/13/2021 1321   CALCIUM 9.4 01/13/2021 1321   PROT 7.2 11/20/2020 1540   ALBUMIN 3.9 11/20/2020 1540   AST 19 11/20/2020 1540   ALT 23 11/20/2020 1540   ALKPHOS 72 11/20/2020 1540   BILITOT 0.8 11/20/2020 1540   GFRNONAA 36 (L) 11/20/2020 1540   GFRAA 35 (L) 09/17/2020 1149   Lab Results  Component Value Date   CHOL 119 11/12/2009   HDL 47.00 11/12/2009   LDLCALC 57 11/12/2009   LDLDIRECT 141.0 01/30/2007   TRIG 75.0 11/12/2009   CHOLHDL 3 11/12/2009   Lab Results  Component Value Date   HGBA1C 7.4 (H) 01/08/2020   No results found for: VITAMINB12 Lab Results  Component Value Date   TSH 1.90 08/19/2010     ASSESSMENT AND PLAN 75 y.o. year old male  has a past medical history of Anginal pain ([place]), Anxiety, Arthritis, Benign prostatic hypertrophy, Bladder stones (06/25/2013), BPH with urinary obstruction (06/25/2013), Cardiomyopathy ([place]), CHF (congestive heart failure) ([employer]), Complication of anesthesia, Depression, Diabetes mellitus without complication ([place]), Dysrhythmia, ED (erectile dysfunction), History of kidney stones, Hyperlipidemia, Hypertension, PONV (postoperative nausea and vomiting), Sleep apnea, Spinal headache, and Type 2 DM with CKD stage 3 and hypertension ([place]). here with   No diagnosis found.    Keith [name]. is doing well on BiPAP therapy. Compliance report reveals excellent daily compliance but suboptimal 4-hour compliance.  He has reached out to his DME company for replacement mask and headgear, however, reports that he was told there were none in stock.  I will send orders today for a mask refitting and reeducation for appropriate fit of mask and how to monitor for leak at home.  Leak has improved  since last being seen.  We will continue to monitor closely. He was encouraged to continue using BiPAP nightly and for greater than 4 hours each night. We will update supply orders as indicated. Risks of untreated sleep apnea review and education materials provided. Healthy lifestyle habits encouraged. He will follow up in 6 months, sooner if needed. He verbalizes understanding and agreement with  this plan.   No orders of the defined types were placed in this encounter.    No orders of the defined types were placed in this encounter.     Debbora Presto, FNP-C 04/05/2021, 7:21 AM [facility] [address] [place], [place] [address] [phone]

## 2021-04-30 ENCOUNTER — Encounter: Payer: Self-pay | Admitting: Podiatry

## 2021-04-30 ENCOUNTER — Ambulatory Visit (INDEPENDENT_AMBULATORY_CARE_PROVIDER_SITE_OTHER): Payer: Medicare Other | Admitting: Podiatry

## 2021-04-30 ENCOUNTER — Other Ambulatory Visit: Payer: Self-pay

## 2021-04-30 DIAGNOSIS — N1832 Chronic kidney disease, stage 3b: Secondary | ICD-10-CM

## 2021-04-30 DIAGNOSIS — M79674 Pain in right toe(s): Secondary | ICD-10-CM

## 2021-04-30 DIAGNOSIS — M79675 Pain in left toe(s): Secondary | ICD-10-CM

## 2021-04-30 DIAGNOSIS — E1142 Type 2 diabetes mellitus with diabetic polyneuropathy: Secondary | ICD-10-CM | POA: Diagnosis not present

## 2021-04-30 DIAGNOSIS — B351 Tinea unguium: Secondary | ICD-10-CM

## 2021-04-30 DIAGNOSIS — N179 Acute kidney failure, unspecified: Secondary | ICD-10-CM | POA: Diagnosis not present

## 2021-04-30 NOTE — Progress Notes (Signed)
This patient returns to my office for at risk foot care.  This patient requires this care by a professional since this patient will be at risk due to having CKD and diabetes  This patient is unable to cut nails himself since the patient cannot reach his nails.These nails are painful walking and wearing shoes.  This patient presents for at risk foot care today.  General Appearance  Alert, conversant and in no acute stress.  Vascular  Dorsalis pedis and posterior tibial  pulses are palpable  bilaterally.  Capillary return is within normal limits  bilaterally. Temperature is within normal limits  bilaterally.  Neurologic  Senn-Weinstein monofilament wire test within normal limits  bilaterally. Muscle power within normal limits bilaterally.  Nails Thick disfigured discolored nails with subungual debris  from hallux to fifth toes bilaterally. No evidence of bacterial infection or drainage bilaterally.  Orthopedic  No limitations of motion  feet .  No crepitus or effusions noted.  No bony pathology or digital deformities noted.  Skin  normotropic skin with no porokeratosis noted bilaterally.  No signs of infections or ulcers noted.     Onychomycosis  Pain in right toes  Pain in left toes  Consent was obtained for treatment procedures.   Mechanical debridement of nails 1-5  bilaterally performed with a nail nipper.  Filed with dremel without incident.    Return office visit    prn since patient is moving to [place].                 Told patient to return for periodic foot care and evaluation due to potential at risk complications.   Gardiner Barefoot DPM

## 2021-09-14 ENCOUNTER — Encounter (INDEPENDENT_AMBULATORY_CARE_PROVIDER_SITE_OTHER): Payer: Medicare Other | Admitting: Ophthalmology

## 2022-01-11 ENCOUNTER — Other Ambulatory Visit: Payer: Self-pay | Admitting: Student
